# Patient Record
Sex: Male | Born: 1940 | Race: White | Hispanic: No | Marital: Married | Smoking: Never smoker
Health system: Southern US, Community
[De-identification: ages and names within clinical notes are randomized; demographics above are authoritative.]

## PROBLEM LIST (undated history)

## (undated) DIAGNOSIS — I255 Ischemic cardiomyopathy: Secondary | ICD-10-CM

## (undated) DIAGNOSIS — H35039 Hypertensive retinopathy, unspecified eye: Secondary | ICD-10-CM

## (undated) DIAGNOSIS — Z8739 Personal history of other diseases of the musculoskeletal system and connective tissue: Secondary | ICD-10-CM

## (undated) DIAGNOSIS — F419 Anxiety disorder, unspecified: Secondary | ICD-10-CM

## (undated) DIAGNOSIS — K219 Gastro-esophageal reflux disease without esophagitis: Secondary | ICD-10-CM

## (undated) DIAGNOSIS — I219 Acute myocardial infarction, unspecified: Secondary | ICD-10-CM

## (undated) DIAGNOSIS — I509 Heart failure, unspecified: Secondary | ICD-10-CM

## (undated) DIAGNOSIS — I2510 Atherosclerotic heart disease of native coronary artery without angina pectoris: Secondary | ICD-10-CM

## (undated) DIAGNOSIS — I10 Essential (primary) hypertension: Secondary | ICD-10-CM

## (undated) DIAGNOSIS — I739 Peripheral vascular disease, unspecified: Secondary | ICD-10-CM

## (undated) DIAGNOSIS — M1990 Unspecified osteoarthritis, unspecified site: Secondary | ICD-10-CM

## (undated) DIAGNOSIS — Z8679 Personal history of other diseases of the circulatory system: Secondary | ICD-10-CM

## (undated) DIAGNOSIS — I350 Nonrheumatic aortic (valve) stenosis: Secondary | ICD-10-CM

## (undated) DIAGNOSIS — T827XXA Infection and inflammatory reaction due to other cardiac and vascular devices, implants and grafts, initial encounter: Secondary | ICD-10-CM

## (undated) DIAGNOSIS — I209 Angina pectoris, unspecified: Secondary | ICD-10-CM

## (undated) DIAGNOSIS — R0602 Shortness of breath: Secondary | ICD-10-CM

## (undated) DIAGNOSIS — E785 Hyperlipidemia, unspecified: Secondary | ICD-10-CM

## (undated) DIAGNOSIS — I499 Cardiac arrhythmia, unspecified: Secondary | ICD-10-CM

## (undated) DIAGNOSIS — R079 Chest pain, unspecified: Secondary | ICD-10-CM

## (undated) HISTORY — DX: Hypertensive retinopathy, unspecified eye: H35.039

## (undated) HISTORY — DX: Atherosclerotic heart disease of native coronary artery without angina pectoris: I25.10

## (undated) HISTORY — PX: EYE SURGERY: SHX253

## (undated) HISTORY — DX: Essential (primary) hypertension: I10

## (undated) HISTORY — DX: Personal history of other diseases of the musculoskeletal system and connective tissue: Z87.39

## (undated) HISTORY — PX: CATARACT EXTRACTION: SUR2

## (undated) HISTORY — DX: Chest pain, unspecified: R07.9

## (undated) HISTORY — DX: Ischemic cardiomyopathy: I25.5

## (undated) HISTORY — DX: Gastro-esophageal reflux disease without esophagitis: K21.9

## (undated) HISTORY — DX: Peripheral vascular disease, unspecified: I73.9

## (undated) HISTORY — DX: Nonrheumatic aortic (valve) stenosis: I35.0

## (undated) HISTORY — DX: Personal history of other diseases of the circulatory system: Z86.79

## (undated) HISTORY — DX: Hyperlipidemia, unspecified: E78.5

---

## 1991-12-23 HISTORY — PX: CORONARY ARTERY BYPASS GRAFT: SHX141

## 1999-09-28 ENCOUNTER — Encounter: Payer: Self-pay | Admitting: Emergency Medicine

## 1999-09-28 ENCOUNTER — Inpatient Hospital Stay (HOSPITAL_COMMUNITY): Admission: EM | Admit: 1999-09-28 | Discharge: 1999-10-06 | Payer: Self-pay | Admitting: Emergency Medicine

## 1999-09-30 ENCOUNTER — Encounter: Payer: Self-pay | Admitting: Cardiothoracic Surgery

## 1999-10-01 ENCOUNTER — Encounter: Payer: Self-pay | Admitting: Cardiothoracic Surgery

## 1999-10-02 ENCOUNTER — Encounter: Payer: Self-pay | Admitting: Cardiothoracic Surgery

## 1999-10-03 ENCOUNTER — Encounter: Payer: Self-pay | Admitting: Cardiothoracic Surgery

## 1999-10-04 ENCOUNTER — Encounter: Payer: Self-pay | Admitting: Cardiothoracic Surgery

## 1999-11-08 ENCOUNTER — Inpatient Hospital Stay (HOSPITAL_COMMUNITY): Admission: EM | Admit: 1999-11-08 | Discharge: 1999-11-10 | Payer: Self-pay | Admitting: Emergency Medicine

## 1999-11-08 ENCOUNTER — Encounter: Payer: Self-pay | Admitting: Emergency Medicine

## 1999-11-08 ENCOUNTER — Encounter: Payer: Self-pay | Admitting: Cardiovascular Disease

## 2002-11-21 HISTORY — PX: CAROTID ENDARTERECTOMY: SUR193

## 2002-12-08 ENCOUNTER — Encounter: Payer: Self-pay | Admitting: Cardiovascular Disease

## 2002-12-08 ENCOUNTER — Ambulatory Visit (HOSPITAL_COMMUNITY): Admission: RE | Admit: 2002-12-08 | Discharge: 2002-12-08 | Payer: Self-pay | Admitting: Cardiovascular Disease

## 2002-12-09 ENCOUNTER — Inpatient Hospital Stay (HOSPITAL_COMMUNITY): Admission: EM | Admit: 2002-12-09 | Discharge: 2002-12-15 | Payer: Self-pay | Admitting: Emergency Medicine

## 2002-12-09 ENCOUNTER — Encounter: Payer: Self-pay | Admitting: Cardiovascular Disease

## 2003-01-04 ENCOUNTER — Ambulatory Visit (HOSPITAL_COMMUNITY): Admission: RE | Admit: 2003-01-04 | Discharge: 2003-01-04 | Payer: Self-pay | Admitting: Cardiovascular Disease

## 2003-01-04 ENCOUNTER — Encounter: Payer: Self-pay | Admitting: Cardiovascular Disease

## 2003-12-18 HISTORY — PX: CARDIAC CATHETERIZATION: SHX172

## 2004-12-17 ENCOUNTER — Observation Stay (HOSPITAL_COMMUNITY): Admission: EM | Admit: 2004-12-17 | Discharge: 2004-12-18 | Payer: Self-pay | Admitting: Emergency Medicine

## 2004-12-17 ENCOUNTER — Encounter (INDEPENDENT_AMBULATORY_CARE_PROVIDER_SITE_OTHER): Payer: Self-pay | Admitting: Cardiovascular Disease

## 2005-01-23 ENCOUNTER — Ambulatory Visit (HOSPITAL_COMMUNITY): Admission: RE | Admit: 2005-01-23 | Discharge: 2005-01-24 | Payer: Self-pay | Admitting: *Deleted

## 2005-08-24 ENCOUNTER — Emergency Department (HOSPITAL_COMMUNITY): Admission: EM | Admit: 2005-08-24 | Discharge: 2005-08-25 | Payer: Self-pay | Admitting: Emergency Medicine

## 2005-11-28 ENCOUNTER — Observation Stay (HOSPITAL_COMMUNITY): Admission: EM | Admit: 2005-11-28 | Discharge: 2005-11-29 | Payer: Self-pay | Admitting: Emergency Medicine

## 2006-01-23 ENCOUNTER — Inpatient Hospital Stay (HOSPITAL_COMMUNITY): Admission: EM | Admit: 2006-01-23 | Discharge: 2006-01-25 | Payer: Self-pay | Admitting: Emergency Medicine

## 2006-01-23 ENCOUNTER — Ambulatory Visit: Payer: Self-pay | Admitting: Family Medicine

## 2006-03-04 ENCOUNTER — Ambulatory Visit (HOSPITAL_COMMUNITY): Admission: RE | Admit: 2006-03-04 | Discharge: 2006-03-04 | Payer: Self-pay | Admitting: Cardiovascular Disease

## 2006-04-04 ENCOUNTER — Inpatient Hospital Stay (HOSPITAL_COMMUNITY): Admission: EM | Admit: 2006-04-04 | Discharge: 2006-04-06 | Payer: Self-pay | Admitting: Emergency Medicine

## 2006-04-05 ENCOUNTER — Encounter: Payer: Self-pay | Admitting: Vascular Surgery

## 2006-04-05 ENCOUNTER — Encounter (INDEPENDENT_AMBULATORY_CARE_PROVIDER_SITE_OTHER): Payer: Self-pay | Admitting: Cardiology

## 2006-06-25 ENCOUNTER — Encounter: Admission: RE | Admit: 2006-06-25 | Discharge: 2006-09-23 | Payer: Self-pay | Admitting: *Deleted

## 2006-06-25 ENCOUNTER — Ambulatory Visit: Payer: Self-pay | Admitting: Psychology

## 2006-08-31 ENCOUNTER — Encounter: Admission: RE | Admit: 2006-08-31 | Discharge: 2006-08-31 | Payer: Self-pay | Admitting: *Deleted

## 2007-05-18 ENCOUNTER — Emergency Department (HOSPITAL_COMMUNITY): Admission: EM | Admit: 2007-05-18 | Discharge: 2007-05-19 | Payer: Self-pay | Admitting: Emergency Medicine

## 2007-08-12 ENCOUNTER — Observation Stay (HOSPITAL_COMMUNITY): Admission: EM | Admit: 2007-08-12 | Discharge: 2007-08-14 | Payer: Self-pay | Admitting: Emergency Medicine

## 2007-09-09 ENCOUNTER — Ambulatory Visit: Payer: Self-pay | Admitting: Internal Medicine

## 2007-09-09 ENCOUNTER — Inpatient Hospital Stay (HOSPITAL_COMMUNITY): Admission: AD | Admit: 2007-09-09 | Discharge: 2007-09-16 | Payer: Self-pay | Admitting: Cardiovascular Disease

## 2007-10-06 ENCOUNTER — Ambulatory Visit: Payer: Self-pay

## 2007-10-20 ENCOUNTER — Emergency Department (HOSPITAL_COMMUNITY): Admission: EM | Admit: 2007-10-20 | Discharge: 2007-10-20 | Payer: Self-pay | Admitting: Emergency Medicine

## 2007-10-22 ENCOUNTER — Emergency Department (HOSPITAL_COMMUNITY): Admission: EM | Admit: 2007-10-22 | Discharge: 2007-10-23 | Payer: Self-pay | Admitting: Emergency Medicine

## 2007-11-24 ENCOUNTER — Inpatient Hospital Stay (HOSPITAL_COMMUNITY): Admission: AD | Admit: 2007-11-24 | Discharge: 2007-11-28 | Payer: Self-pay | Admitting: Cardiovascular Disease

## 2008-09-06 ENCOUNTER — Inpatient Hospital Stay (HOSPITAL_COMMUNITY): Admission: EM | Admit: 2008-09-06 | Discharge: 2008-09-11 | Payer: Self-pay | Admitting: Emergency Medicine

## 2008-09-22 IMAGING — CR DG CHEST 2V
2 series · 2 of 2 positions shown · non-contrast
Comparison: 11/27/2007

CLINICAL DATA: Cough, fever, right-sided chest pain

CHEST - 2 VIEW

[w chest pa]
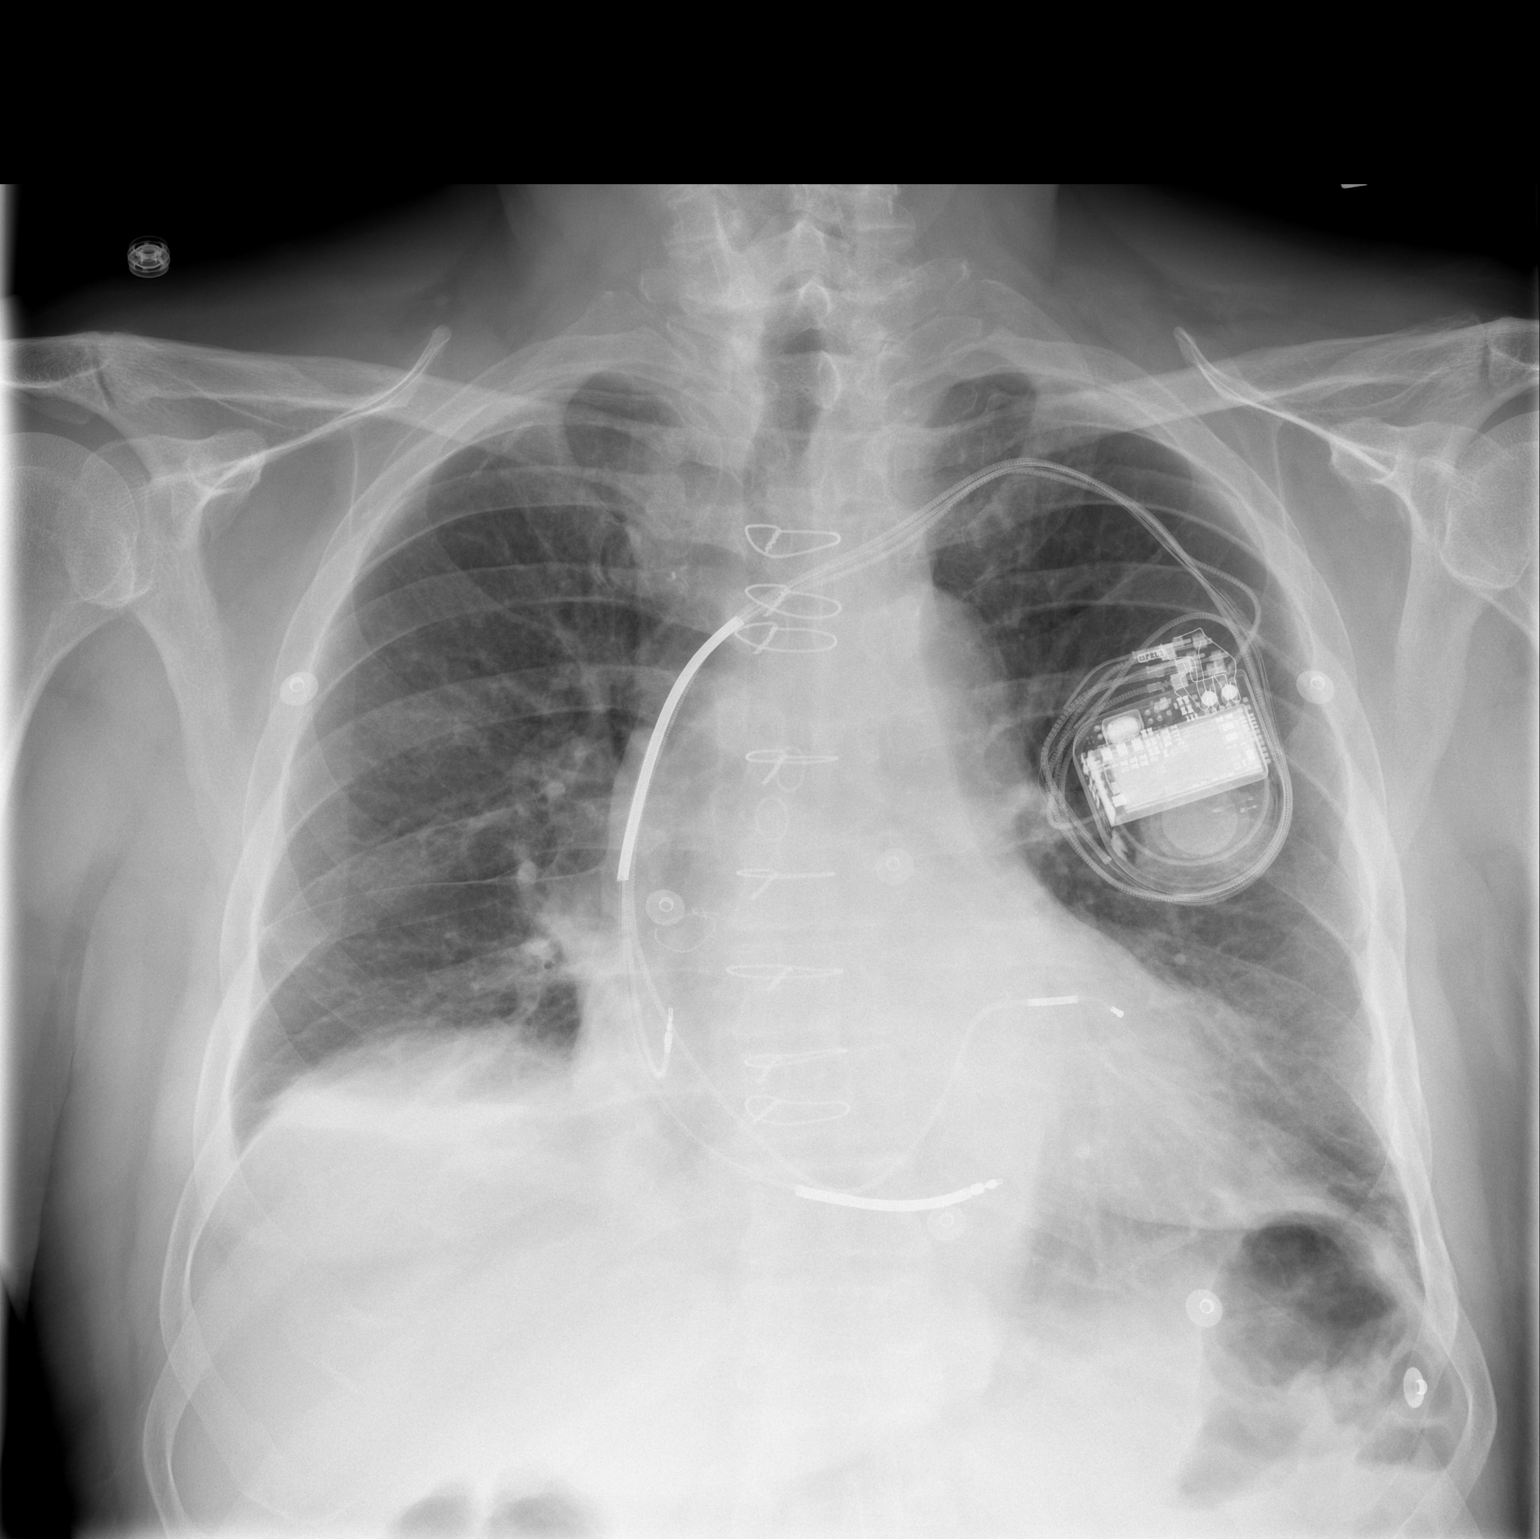

[w chest lat]
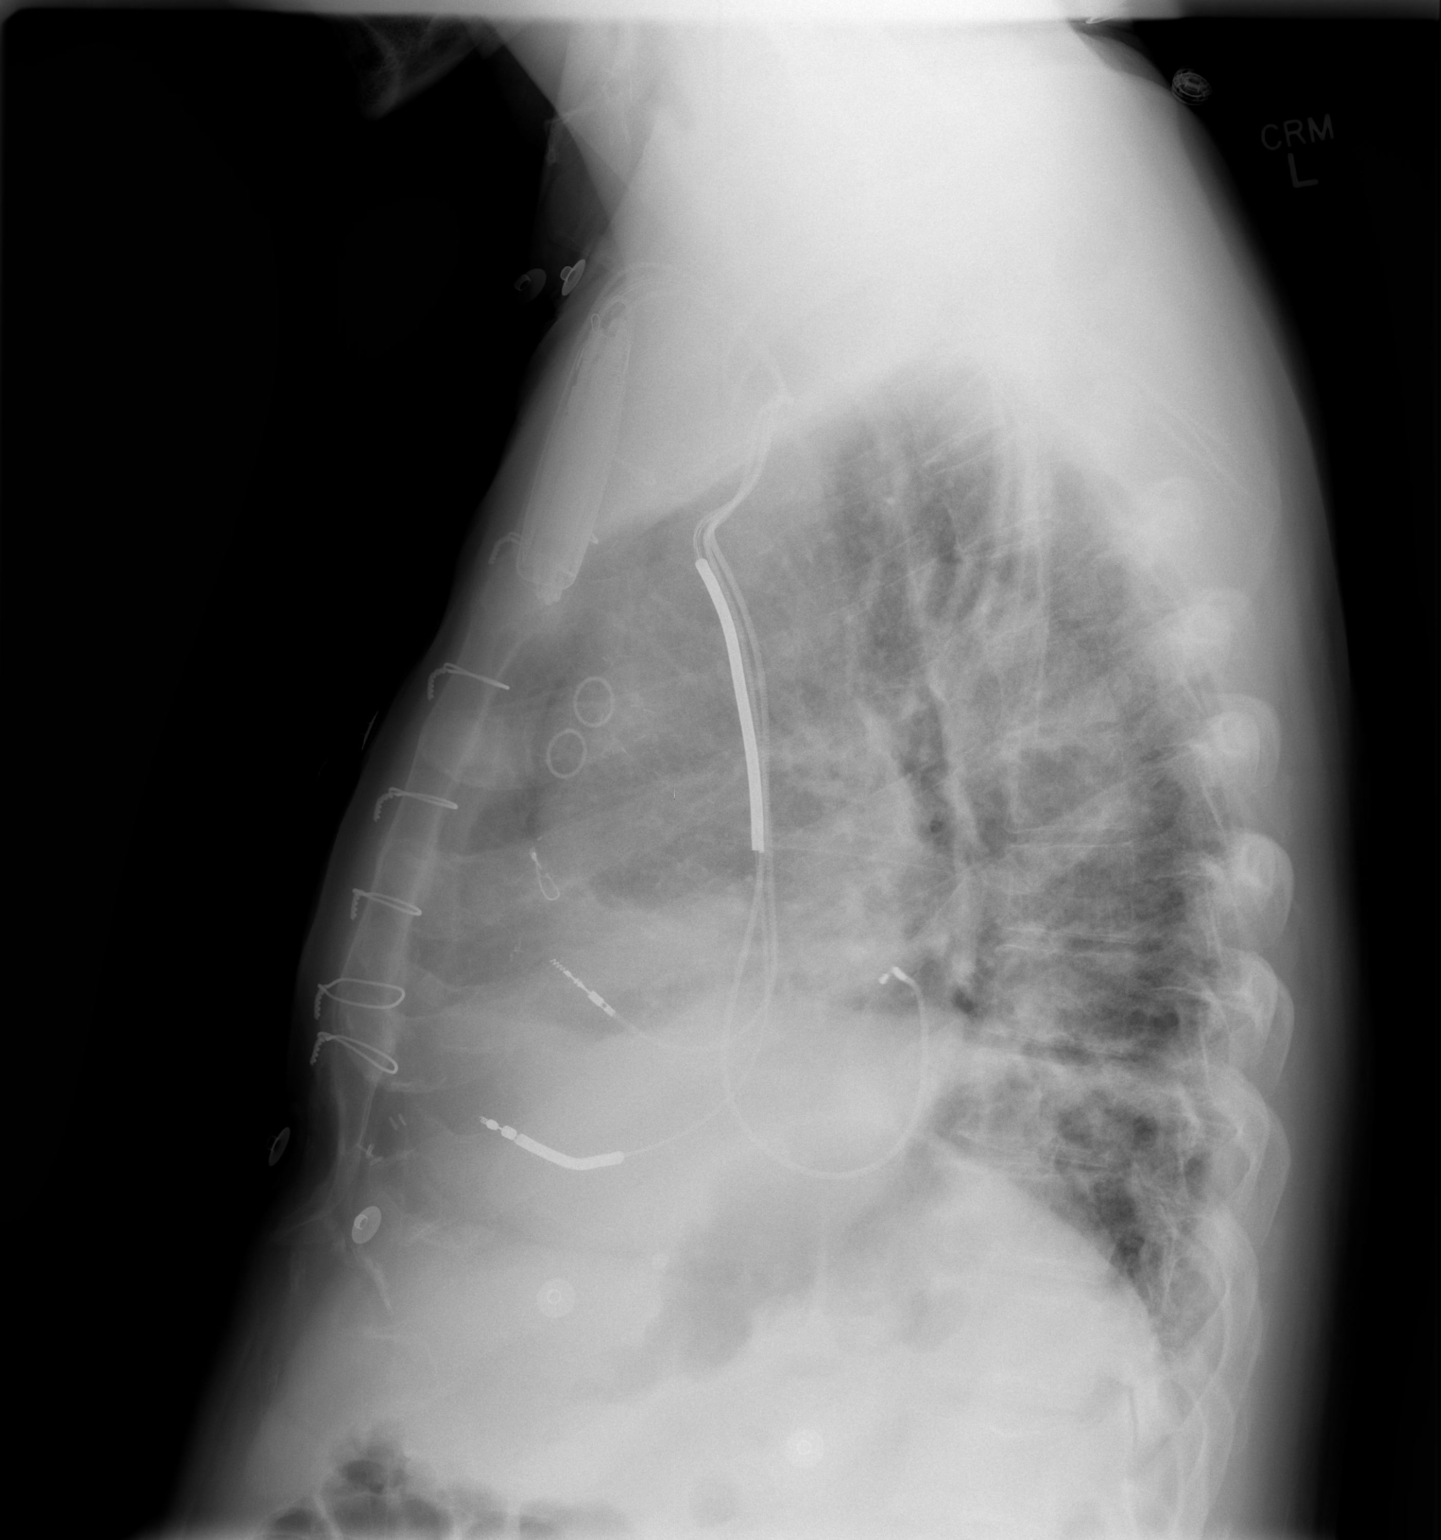

[2 of 2 positions shown; findings below may reference images not displayed]

FINDINGS: The patient is status post CABG.  Left AICD noted.  Dense
plate-like right lower lobe consolidation noted with trace right
pleural effusion.  Left lower lobe atelectasis identified.
IMPRESSION: Right lower lobe airspace disease most compatible with pneumonia.

Trace right effusion.

## 2008-10-21 ENCOUNTER — Ambulatory Visit: Payer: Self-pay | Admitting: Internal Medicine

## 2008-10-21 ENCOUNTER — Inpatient Hospital Stay (HOSPITAL_COMMUNITY): Admission: EM | Admit: 2008-10-21 | Discharge: 2008-10-24 | Payer: Self-pay | Admitting: *Deleted

## 2008-11-06 ENCOUNTER — Encounter: Admission: RE | Admit: 2008-11-06 | Discharge: 2008-11-06 | Payer: Self-pay | Admitting: Gastroenterology

## 2008-11-09 ENCOUNTER — Ambulatory Visit: Payer: Self-pay | Admitting: Internal Medicine

## 2008-11-11 ENCOUNTER — Inpatient Hospital Stay (HOSPITAL_COMMUNITY): Admission: EM | Admit: 2008-11-11 | Discharge: 2008-11-15 | Payer: Self-pay | Admitting: *Deleted

## 2008-12-12 ENCOUNTER — Ambulatory Visit: Payer: Self-pay | Admitting: Internal Medicine

## 2008-12-22 HISTORY — PX: VALVE REPLACEMENT: SUR13

## 2008-12-27 ENCOUNTER — Inpatient Hospital Stay (HOSPITAL_COMMUNITY): Admission: EM | Admit: 2008-12-27 | Discharge: 2008-12-30 | Payer: Self-pay | Admitting: Emergency Medicine

## 2009-01-01 ENCOUNTER — Ambulatory Visit: Payer: Self-pay | Admitting: Internal Medicine

## 2009-01-17 DIAGNOSIS — I25810 Atherosclerosis of coronary artery bypass graft(s) without angina pectoris: Secondary | ICD-10-CM | POA: Insufficient documentation

## 2009-01-17 DIAGNOSIS — I5042 Chronic combined systolic (congestive) and diastolic (congestive) heart failure: Secondary | ICD-10-CM

## 2009-01-17 DIAGNOSIS — I4891 Unspecified atrial fibrillation: Secondary | ICD-10-CM

## 2009-02-08 ENCOUNTER — Inpatient Hospital Stay (HOSPITAL_COMMUNITY): Admission: EM | Admit: 2009-02-08 | Discharge: 2009-02-13 | Payer: Self-pay | Admitting: Emergency Medicine

## 2009-02-24 IMAGING — CR DG CHEST 1V PORT
1 series · 1 of 1 positions shown · non-contrast
Comparison: 12/27/2008 and 11/11/2008.

CLINICAL DATA: Shortness of breath.  Tachycardia.

PORTABLE CHEST - 1 VIEW

[view not recorded]
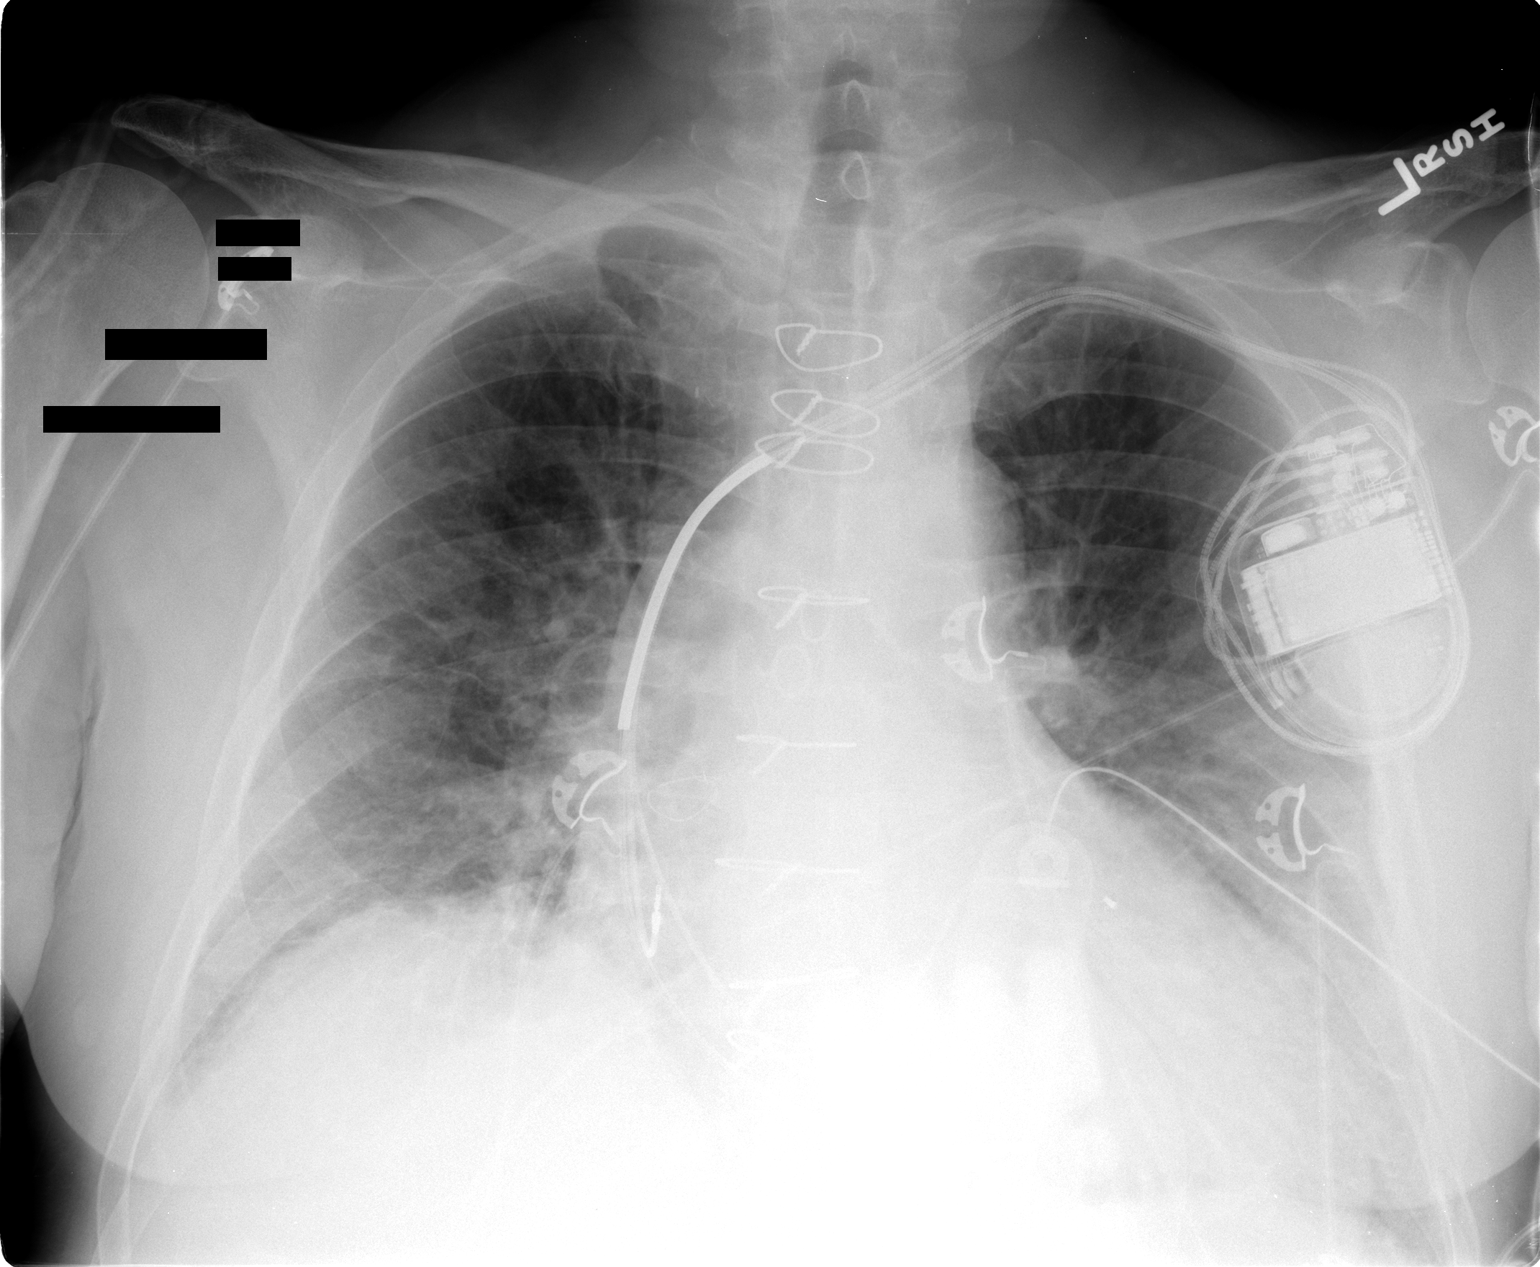

[1 of 1 positions shown; findings below may reference images not displayed]

FINDINGS: 9549 hours.  The left subclavian AICD leads appear stable
in position.  There is stable cardiac enlargement status post
median sternotomy and CABG.  Chronic elevation of the right
hemidiaphragm is associated with stable right basilar atelectasis.
There has been interval development of mild pulmonary edema,
asymmetric to the right.  No significant pleural effusion is seen.
IMPRESSION: Mild pulmonary edema with stable cardiomegaly and right basilar
atelectasis.

## 2009-02-25 IMAGING — CR DG CHEST 1V PORT
1 series · 1 of 1 positions shown · non-contrast
Comparison: Portable chest 02/08/2009.

CLINICAL DATA: Congestive heart failure.

PORTABLE CHEST - 1 VIEW

[view not recorded]
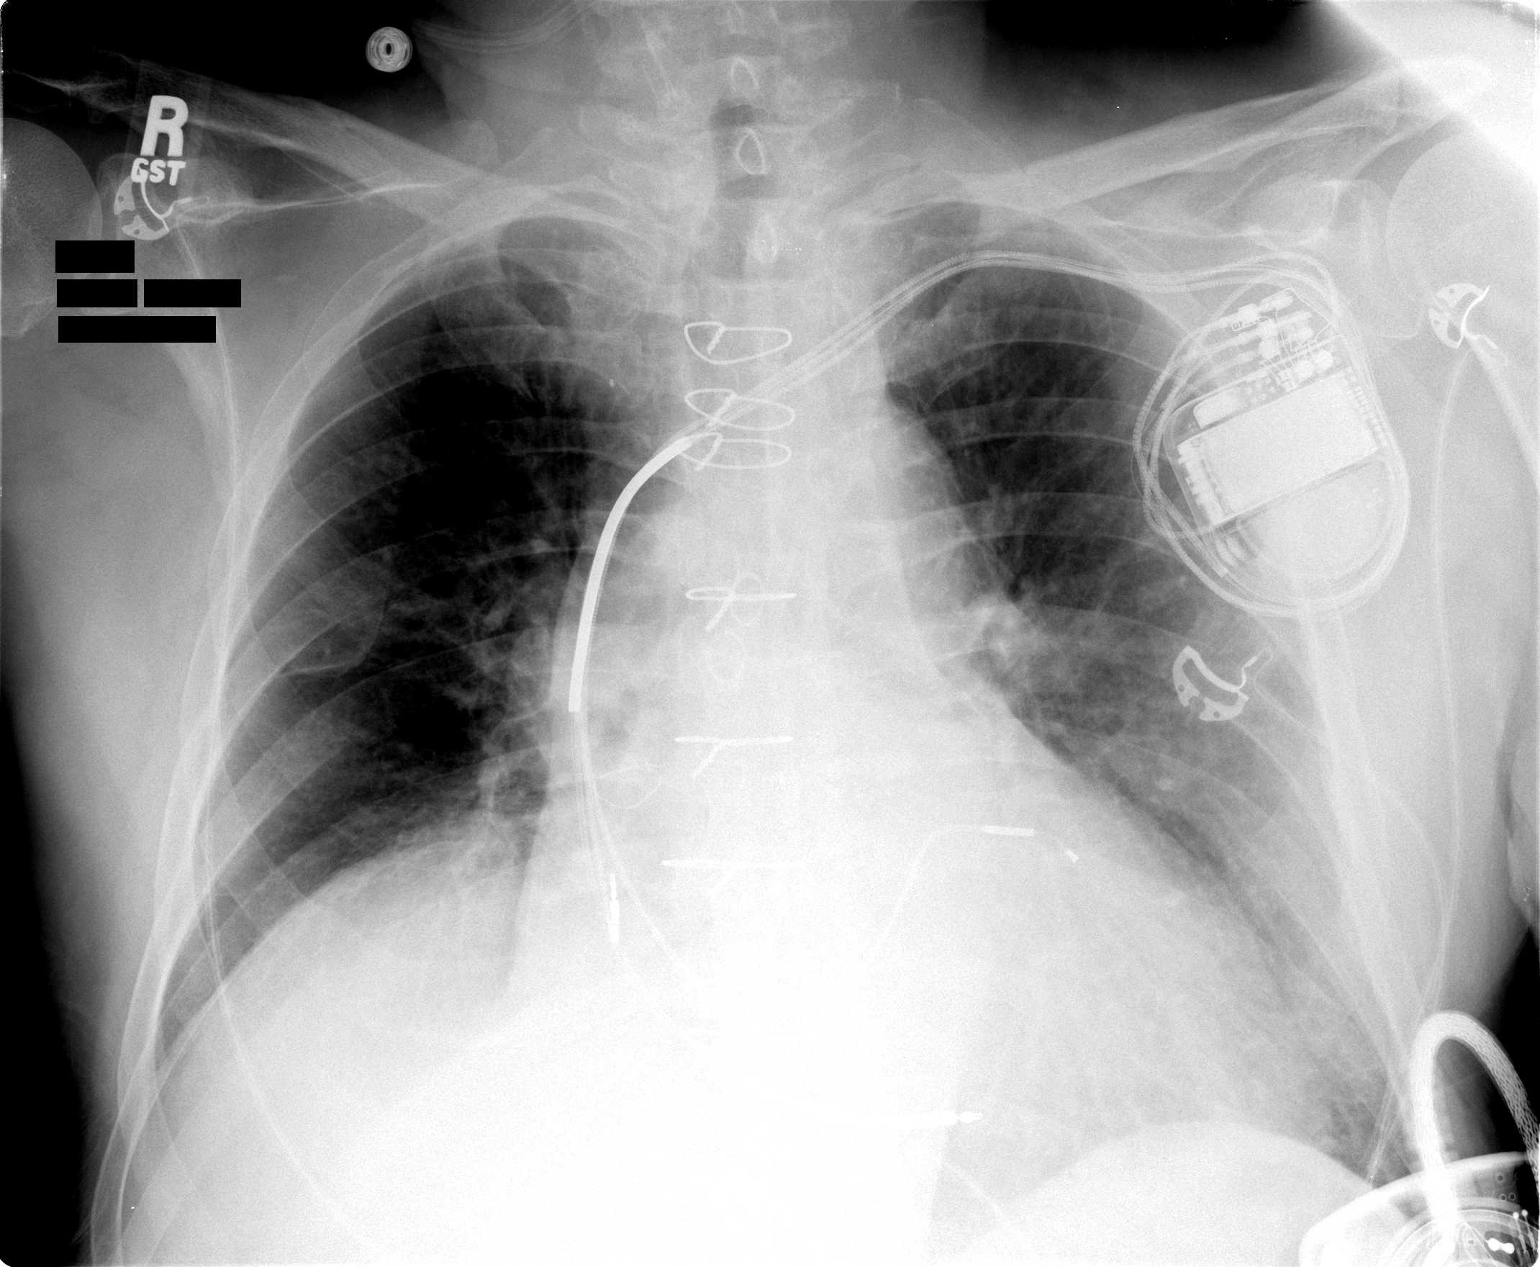

[1 of 1 positions shown; findings below may reference images not displayed]

FINDINGS: There is cardiomegaly with decreased pulmonary edema.
Elevation right hemidiaphragm again noted.
IMPRESSION: Improved pulmonary edema.

## 2009-03-14 DIAGNOSIS — E039 Hypothyroidism, unspecified: Secondary | ICD-10-CM

## 2009-03-14 DIAGNOSIS — I679 Cerebrovascular disease, unspecified: Secondary | ICD-10-CM

## 2009-03-14 DIAGNOSIS — N189 Chronic kidney disease, unspecified: Secondary | ICD-10-CM

## 2009-03-14 DIAGNOSIS — Z862 Personal history of diseases of the blood and blood-forming organs and certain disorders involving the immune mechanism: Secondary | ICD-10-CM

## 2009-03-14 DIAGNOSIS — Z951 Presence of aortocoronary bypass graft: Secondary | ICD-10-CM

## 2009-03-14 DIAGNOSIS — I10 Essential (primary) hypertension: Secondary | ICD-10-CM | POA: Insufficient documentation

## 2009-03-14 DIAGNOSIS — F1020 Alcohol dependence, uncomplicated: Secondary | ICD-10-CM

## 2009-03-14 DIAGNOSIS — I255 Ischemic cardiomyopathy: Secondary | ICD-10-CM

## 2009-03-14 DIAGNOSIS — E785 Hyperlipidemia, unspecified: Secondary | ICD-10-CM

## 2009-03-14 DIAGNOSIS — M109 Gout, unspecified: Secondary | ICD-10-CM

## 2009-03-14 DIAGNOSIS — Z9889 Other specified postprocedural states: Secondary | ICD-10-CM | POA: Insufficient documentation

## 2009-03-14 DIAGNOSIS — I739 Peripheral vascular disease, unspecified: Secondary | ICD-10-CM

## 2009-03-15 ENCOUNTER — Encounter: Payer: Self-pay | Admitting: Internal Medicine

## 2009-03-15 ENCOUNTER — Ambulatory Visit: Payer: Self-pay | Admitting: Internal Medicine

## 2009-04-04 ENCOUNTER — Encounter: Payer: Self-pay | Admitting: Internal Medicine

## 2009-04-09 ENCOUNTER — Encounter: Payer: Self-pay | Admitting: Internal Medicine

## 2009-04-09 ENCOUNTER — Ambulatory Visit: Payer: Self-pay | Admitting: Internal Medicine

## 2009-04-17 ENCOUNTER — Inpatient Hospital Stay (HOSPITAL_COMMUNITY): Admission: EM | Admit: 2009-04-17 | Discharge: 2009-04-23 | Payer: Self-pay | Admitting: Emergency Medicine

## 2009-04-30 ENCOUNTER — Encounter: Admission: RE | Admit: 2009-04-30 | Discharge: 2009-04-30 | Payer: Self-pay | Admitting: Gastroenterology

## 2009-05-05 ENCOUNTER — Inpatient Hospital Stay (HOSPITAL_COMMUNITY): Admission: EM | Admit: 2009-05-05 | Discharge: 2009-05-21 | Payer: Self-pay | Admitting: Emergency Medicine

## 2009-05-05 ENCOUNTER — Ambulatory Visit: Payer: Self-pay | Admitting: Cardiothoracic Surgery

## 2009-05-11 ENCOUNTER — Encounter: Payer: Self-pay | Admitting: Cardiothoracic Surgery

## 2009-05-11 ENCOUNTER — Ambulatory Visit: Payer: Self-pay | Admitting: Dentistry

## 2009-05-15 ENCOUNTER — Ambulatory Visit: Payer: Self-pay | Admitting: Dentistry

## 2009-05-17 ENCOUNTER — Encounter (INDEPENDENT_AMBULATORY_CARE_PROVIDER_SITE_OTHER): Payer: Self-pay | Admitting: Cardiology

## 2009-05-29 ENCOUNTER — Encounter: Admission: AD | Admit: 2009-05-29 | Discharge: 2009-05-29 | Payer: Self-pay | Admitting: Dentistry

## 2009-06-24 ENCOUNTER — Inpatient Hospital Stay (HOSPITAL_COMMUNITY): Admission: EM | Admit: 2009-06-24 | Discharge: 2009-07-01 | Payer: Self-pay | Admitting: Emergency Medicine

## 2009-07-03 ENCOUNTER — Ambulatory Visit: Payer: Self-pay | Admitting: Dentistry

## 2009-07-10 IMAGING — CR DG CHEST 1V PORT
1 series · 1 of 1 positions shown · non-contrast
Comparison: 05/09/2009

CLINICAL DATA: Shortness of breath and chest pain

PORTABLE CHEST - 1 VIEW

[AP]
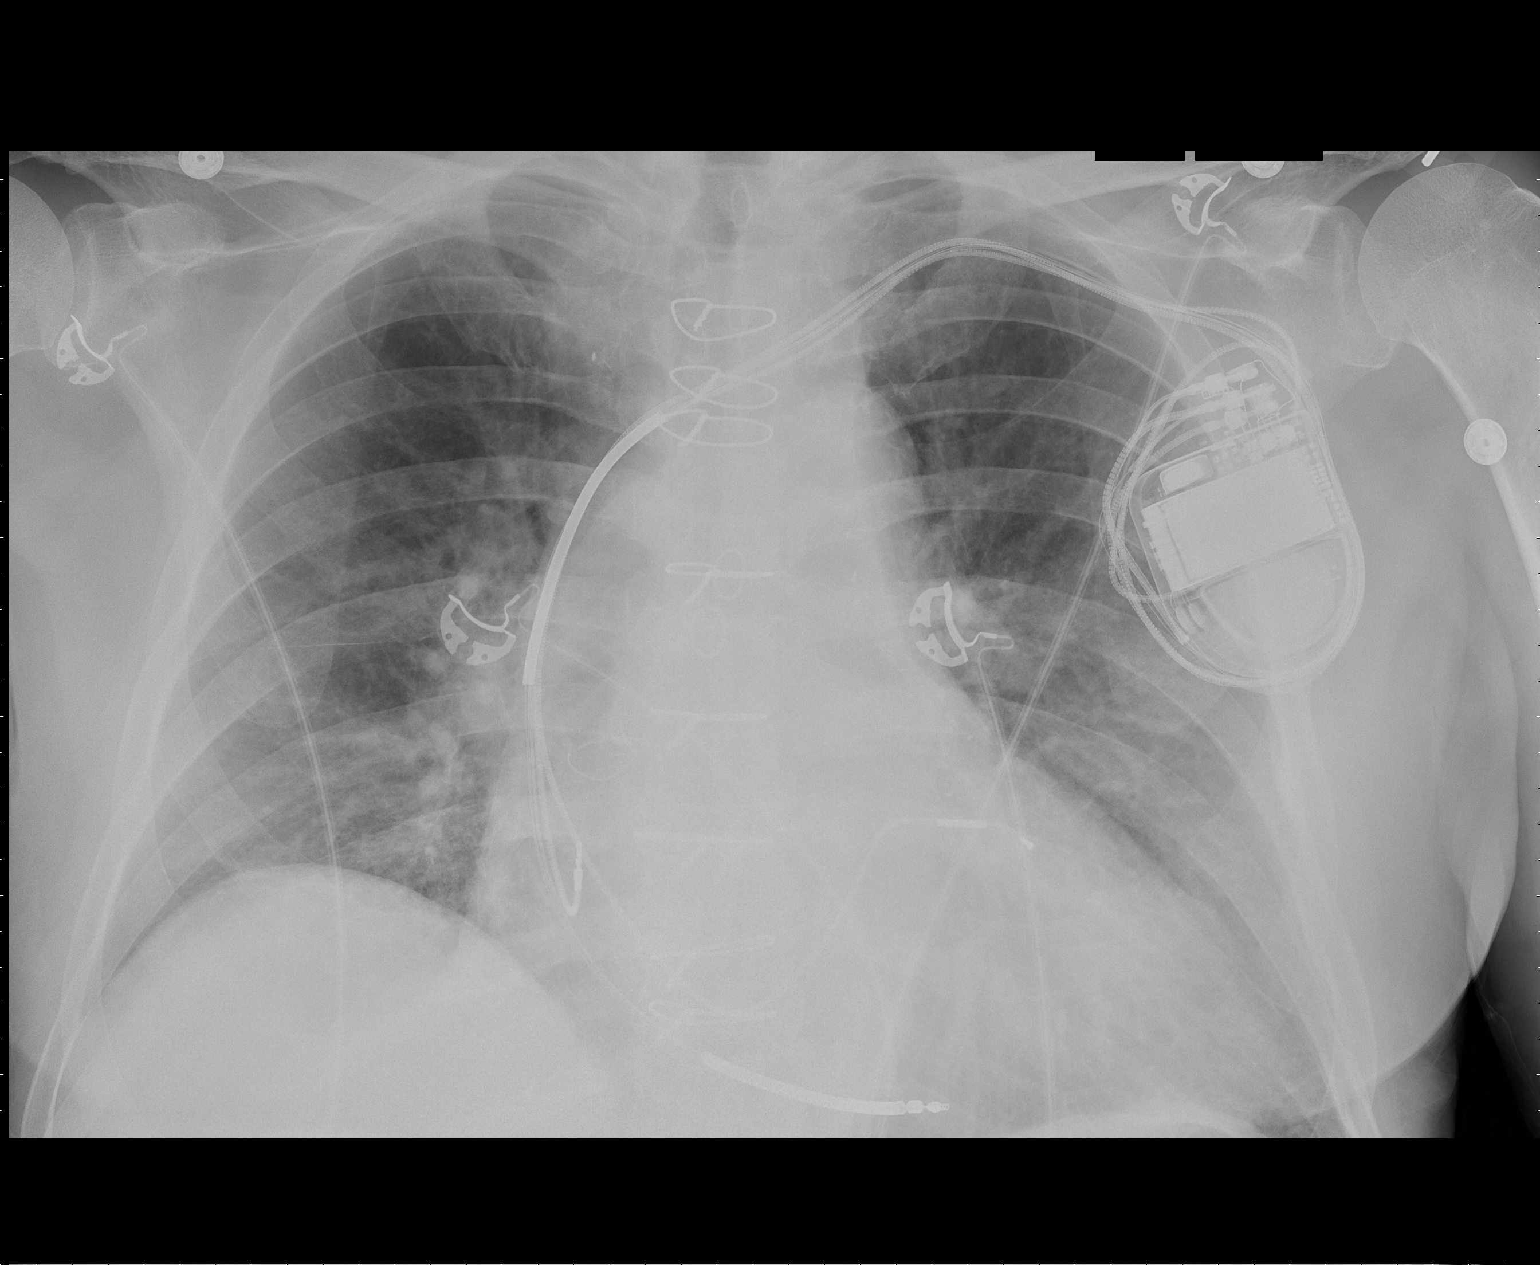

[1 of 1 positions shown; findings below may reference images not displayed]

FINDINGS: There is a left chest wall ICD with leads in the right
atrial appendage coronary sinus and right ventricle.

The heart size is enlarged.

The lung volumes are low.

There is no pleural effusion or pulmonary edema.

No airspace consolidation identified.
IMPRESSION: 1.  Cardiac enlargement without heart failure.

## 2009-07-23 ENCOUNTER — Inpatient Hospital Stay (HOSPITAL_COMMUNITY): Admission: AD | Admit: 2009-07-23 | Discharge: 2009-07-31 | Payer: Self-pay | Admitting: Cardiology

## 2009-07-24 ENCOUNTER — Encounter (INDEPENDENT_AMBULATORY_CARE_PROVIDER_SITE_OTHER): Payer: Self-pay | Admitting: Cardiovascular Disease

## 2009-07-27 ENCOUNTER — Encounter (INDEPENDENT_AMBULATORY_CARE_PROVIDER_SITE_OTHER): Payer: Self-pay | Admitting: *Deleted

## 2009-08-09 ENCOUNTER — Encounter: Payer: Self-pay | Admitting: Internal Medicine

## 2009-09-04 ENCOUNTER — Encounter (INDEPENDENT_AMBULATORY_CARE_PROVIDER_SITE_OTHER): Payer: Self-pay | Admitting: *Deleted

## 2009-09-20 ENCOUNTER — Encounter: Payer: Self-pay | Admitting: Internal Medicine

## 2009-11-08 ENCOUNTER — Encounter (INDEPENDENT_AMBULATORY_CARE_PROVIDER_SITE_OTHER): Payer: Self-pay | Admitting: *Deleted

## 2010-11-26 ENCOUNTER — Encounter (INDEPENDENT_AMBULATORY_CARE_PROVIDER_SITE_OTHER): Payer: Self-pay | Admitting: *Deleted

## 2011-01-12 ENCOUNTER — Encounter: Payer: Self-pay | Admitting: Gastroenterology

## 2011-01-12 ENCOUNTER — Encounter: Payer: Self-pay | Admitting: *Deleted

## 2011-01-21 NOTE — Letter (Signed)
Summary: Device-Delinquent Check  [facility], Main Office  [address]   [place], [place] [address]   Phone: [phone]  Fax: [phone]     November 26, 2010 MRN: [id number]   [name] [address] [place], [place]  [address]   Dear Mr. [facility],  According to our records, you have not had your implanted device checked in the recommended period of time.  We are unable to determine appropriate device function without checking your device on a regular basis.  Please call our office to schedule an appointment, with Dr Graciela Husbands, as soon as possible.  If you are having your device checked by another physician, please call us so that we may update our records.  Thank you,  Letta Moynahan, EMT  November 26, 2010 3:23 PM  [facility]  certified

## 2011-03-29 LAB — COMPREHENSIVE METABOLIC PANEL
AST: 19 U/L (ref 0–37)
Albumin: 2.8 g/dL — ABNORMAL LOW (ref 3.5–5.2)
Alkaline Phosphatase: 87 U/L (ref 39–117)
Alkaline Phosphatase: 91 U/L (ref 39–117)
BUN: 14 mg/dL (ref 6–23)
CO2: 33 mEq/L — ABNORMAL HIGH (ref 19–32)
CO2: 34 mEq/L — ABNORMAL HIGH (ref 19–32)
Calcium: 9.1 mg/dL (ref 8.4–10.5)
Chloride: 98 mEq/L (ref 96–112)
Chloride: 98 mEq/L (ref 96–112)
Chloride: 99 mEq/L (ref 96–112)
Creatinine, Ser: 1.02 mg/dL (ref 0.4–1.5)
Creatinine, Ser: 1.09 mg/dL (ref 0.4–1.5)
Creatinine, Ser: 1.09 mg/dL (ref 0.4–1.5)
GFR calc Af Amer: >60 mL/min (ref >60)
GFR calc non Af Amer: >60 mL/min (ref >60)
GFR calc non Af Amer: >60 mL/min (ref >60)
Glucose, Bld: 80 mg/dL (ref 70–99)
Glucose, Bld: 93 mg/dL (ref 70–99)
Potassium: 3.5 mEq/L (ref 3.5–5.1)
Total Bilirubin: 1 mg/dL (ref 0.3–1.2)
Total Bilirubin: 1.1 mg/dL (ref 0.3–1.2)
Total Bilirubin: 1.2 mg/dL (ref 0.3–1.2)

## 2011-03-29 LAB — MAGNESIUM
Magnesium: 1.9 mg/dL (ref 1.5–2.5)
Magnesium: 2 mg/dL (ref 1.5–2.5)
Magnesium: 2.1 mg/dL (ref 1.5–2.5)

## 2011-03-29 LAB — BASIC METABOLIC PANEL
BUN: 9 mg/dL (ref 6–23)
CO2: 28 mEq/L (ref 19–32)
CO2: 29 mEq/L (ref 19–32)
CO2: 29 mEq/L (ref 19–32)
CO2: 30 mEq/L (ref 19–32)
CO2: 30 mEq/L (ref 19–32)
Calcium: 8.9 mg/dL (ref 8.4–10.5)
Calcium: 9 mg/dL (ref 8.4–10.5)
Calcium: 9.2 mg/dL (ref 8.4–10.5)
Chloride: 100 mEq/L (ref 96–112)
Chloride: 96 mEq/L (ref 96–112)
Chloride: 98 mEq/L (ref 96–112)
Chloride: 99 mEq/L (ref 96–112)
Chloride: 99 mEq/L (ref 96–112)
Chloride: 99 mEq/L (ref 96–112)
Creatinine, Ser: 0.87 mg/dL (ref 0.4–1.5)
Creatinine, Ser: 0.92 mg/dL (ref 0.4–1.5)
Creatinine, Ser: 0.95 mg/dL (ref 0.4–1.5)
Creatinine, Ser: 1.36 mg/dL (ref 0.4–1.5)
GFR calc Af Amer: >60 mL/min (ref >60)
GFR calc Af Amer: >60 mL/min (ref >60)
GFR calc Af Amer: >60 mL/min (ref >60)
GFR calc Af Amer: >60 mL/min (ref >60)
GFR calc Af Amer: >60 mL/min (ref >60)
GFR calc non Af Amer: >60 mL/min (ref >60)
Glucose, Bld: 74 mg/dL (ref 70–99)
Glucose, Bld: 90 mg/dL (ref 70–99)
Glucose, Bld: 94 mg/dL (ref 70–99)
Glucose, Bld: 95 mg/dL (ref 70–99)
Potassium: 3.7 mEq/L (ref 3.5–5.1)
Potassium: 4 mEq/L (ref 3.5–5.1)
Sodium: 133 mEq/L — ABNORMAL LOW (ref 135–145)
Sodium: 136 mEq/L (ref 135–145)

## 2011-03-29 LAB — CBC
HCT: 35.1 % — ABNORMAL LOW (ref 39.0–52.0)
HCT: 37.7 % — ABNORMAL LOW (ref 39.0–52.0)
Hemoglobin: 12.8 g/dL — ABNORMAL LOW (ref 13.0–17.0)
MCHC: 33.4 g/dL (ref 30.0–36.0)
MCHC: 33.4 g/dL (ref 30.0–36.0)
MCHC: 33.7 g/dL (ref 30.0–36.0)
MCV: 88.9 fL (ref 78.0–100.0)
MCV: 89.4 fL (ref 78.0–100.0)
MCV: 89.7 fL (ref 78.0–100.0)
MCV: 89.8 fL (ref 78.0–100.0)
Platelets: 330 10*3/uL (ref 150–400)
Platelets: 334 10*3/uL (ref 150–400)
RBC: 3.94 MIL/uL — ABNORMAL LOW (ref 4.22–5.81)
RBC: 3.95 MIL/uL — ABNORMAL LOW (ref 4.22–5.81)
RBC: 4.24 MIL/uL (ref 4.22–5.81)
RBC: 4.51 MIL/uL (ref 4.22–5.81)
RDW: 16.7 % — ABNORMAL HIGH (ref 11.5–15.5)
RDW: 16.7 % — ABNORMAL HIGH (ref 11.5–15.5)
WBC: 6.3 10*3/uL (ref 4.0–10.5)
WBC: 8.4 10*3/uL (ref 4.0–10.5)
WBC: 8.5 10*3/uL (ref 4.0–10.5)

## 2011-03-29 LAB — LIPID PANEL
Total CHOL/HDL Ratio: 7.8 RATIO
Triglycerides: 121 mg/dL (ref <150)
VLDL: 24 mg/dL (ref 0–40)

## 2011-03-29 LAB — PROTIME-INR
INR: 1 (ref 0.00–1.49)
INR: 1.1 (ref 0.00–1.49)
INR: 2 — ABNORMAL HIGH (ref 0.00–1.49)
INR: 2.4 — ABNORMAL HIGH (ref 0.00–1.49)
INR: 2.4 — ABNORMAL HIGH (ref 0.00–1.49)
Prothrombin Time: 13.2 seconds (ref 11.6–15.2)
Prothrombin Time: 17.7 seconds — ABNORMAL HIGH (ref 11.6–15.2)
Prothrombin Time: 25.9 seconds — ABNORMAL HIGH (ref 11.6–15.2)

## 2011-03-29 LAB — BRAIN NATRIURETIC PEPTIDE
Pro B Natriuretic peptide (BNP): 321 pg/mL — ABNORMAL HIGH (ref 0.0–100.0)
Pro B Natriuretic peptide (BNP): 908 pg/mL — ABNORMAL HIGH (ref 0.0–100.0)

## 2011-03-29 LAB — DIFFERENTIAL
Basophils Relative: 0 % (ref 0–1)
Eosinophils Absolute: 0.1 10*3/uL (ref 0.0–0.7)
Eosinophils Relative: 2 % (ref 0–5)
Monocytes Absolute: 0.8 10*3/uL (ref 0.1–1.0)
Monocytes Relative: 10 % (ref 3–12)
Neutrophils Relative %: 78 % — ABNORMAL HIGH (ref 43–77)

## 2011-03-29 LAB — CARDIAC PANEL(CRET KIN+CKTOT+MB+TROPI)
Relative Index: INVALID (ref 0.0–2.5)
Troponin I: 0.13 ng/mL — ABNORMAL HIGH (ref 0.00–0.06)

## 2011-03-29 LAB — SEDIMENTATION RATE: Sed Rate: 50 mm/hr — ABNORMAL HIGH (ref 0–16)

## 2011-03-29 LAB — POTASSIUM: Potassium: 3.6 mEq/L (ref 3.5–5.1)

## 2011-03-30 LAB — CBC
HCT: 27.5 % — ABNORMAL LOW (ref 39.0–52.0)
HCT: 30.5 % — ABNORMAL LOW (ref 39.0–52.0)
Hemoglobin: 8.7 g/dL — ABNORMAL LOW (ref 13.0–17.0)
Hemoglobin: 9.2 g/dL — ABNORMAL LOW (ref 13.0–17.0)
MCHC: 33.3 g/dL (ref 30.0–36.0)
MCHC: 33.7 g/dL (ref 30.0–36.0)
MCV: 82.6 fL (ref 78.0–100.0)
MCV: 83.7 fL (ref 78.0–100.0)
MCV: 84.4 fL (ref 78.0–100.0)
Platelets: 221 10*3/uL (ref 150–400)
Platelets: 225 10*3/uL (ref 150–400)
Platelets: 248 10*3/uL (ref 150–400)
RBC: 3.08 MIL/uL — ABNORMAL LOW (ref 4.22–5.81)
RBC: 3.61 MIL/uL — ABNORMAL LOW (ref 4.22–5.81)
RBC: 4.13 MIL/uL — ABNORMAL LOW (ref 4.22–5.81)
RDW: 18.5 % — ABNORMAL HIGH (ref 11.5–15.5)
RDW: 18.7 % — ABNORMAL HIGH (ref 11.5–15.5)
WBC: 5.8 10*3/uL (ref 4.0–10.5)
WBC: 6.3 10*3/uL (ref 4.0–10.5)
WBC: 6.8 10*3/uL (ref 4.0–10.5)
WBC: 8.6 10*3/uL (ref 4.0–10.5)

## 2011-03-30 LAB — BASIC METABOLIC PANEL
BUN: 18 mg/dL (ref 6–23)
BUN: 18 mg/dL (ref 6–23)
BUN: 20 mg/dL (ref 6–23)
CO2: 28 mEq/L (ref 19–32)
CO2: 31 mEq/L (ref 19–32)
Calcium: 9 mg/dL (ref 8.4–10.5)
Chloride: 102 mEq/L (ref 96–112)
Chloride: 98 mEq/L (ref 96–112)
Chloride: 98 mEq/L (ref 96–112)
Chloride: 98 mEq/L (ref 96–112)
Creatinine, Ser: 1.17 mg/dL (ref 0.4–1.5)
GFR calc Af Amer: >60 mL/min (ref >60)
GFR calc non Af Amer: >60 mL/min (ref >60)
GFR calc non Af Amer: >60 mL/min (ref >60)
Glucose, Bld: 148 mg/dL — ABNORMAL HIGH (ref 70–99)
Potassium: 3.6 mEq/L (ref 3.5–5.1)
Potassium: 3.8 mEq/L (ref 3.5–5.1)
Potassium: 3.8 mEq/L (ref 3.5–5.1)
Sodium: 133 mEq/L — ABNORMAL LOW (ref 135–145)
Sodium: 134 mEq/L — ABNORMAL LOW (ref 135–145)
Sodium: 138 mEq/L (ref 135–145)

## 2011-03-30 LAB — COMPREHENSIVE METABOLIC PANEL
ALT: 15 U/L (ref 0–53)
AST: 20 U/L (ref 0–37)
Albumin: 3.2 g/dL — ABNORMAL LOW (ref 3.5–5.2)
BUN: 22 mg/dL (ref 6–23)
CO2: 22 mEq/L (ref 19–32)
CO2: 30 mEq/L (ref 19–32)
Calcium: 9.1 mg/dL (ref 8.4–10.5)
Chloride: 95 mEq/L — ABNORMAL LOW (ref 96–112)
Creatinine, Ser: 1.04 mg/dL (ref 0.4–1.5)
Creatinine, Ser: 1.11 mg/dL (ref 0.4–1.5)
GFR calc Af Amer: >60 mL/min (ref >60)
GFR calc non Af Amer: >60 mL/min (ref >60)
GFR calc non Af Amer: >60 mL/min (ref >60)
Glucose, Bld: 105 mg/dL — ABNORMAL HIGH (ref 70–99)
Potassium: 3.6 mEq/L (ref 3.5–5.1)
Sodium: 136 mEq/L (ref 135–145)
Total Bilirubin: 0.7 mg/dL (ref 0.3–1.2)

## 2011-03-30 LAB — APTT: aPTT: 40 seconds — ABNORMAL HIGH (ref 24–37)

## 2011-03-30 LAB — CROSSMATCH

## 2011-03-30 LAB — POCT CARDIAC MARKERS: Myoglobin, poc: 55 ng/mL (ref 12–200)

## 2011-03-30 LAB — PROTIME-INR
INR: 1.8 — ABNORMAL HIGH (ref 0.00–1.49)
INR: 1.9 — ABNORMAL HIGH (ref 0.00–1.49)
INR: 2 — ABNORMAL HIGH (ref 0.00–1.49)
INR: 2.1 — ABNORMAL HIGH (ref 0.00–1.49)
Prothrombin Time: 22.3 seconds — ABNORMAL HIGH (ref 11.6–15.2)
Prothrombin Time: 22.5 seconds — ABNORMAL HIGH (ref 11.6–15.2)
Prothrombin Time: 23.9 seconds — ABNORMAL HIGH (ref 11.6–15.2)
Prothrombin Time: 24.7 seconds — ABNORMAL HIGH (ref 11.6–15.2)

## 2011-03-30 LAB — DIFFERENTIAL
Basophils Absolute: 0 10*3/uL (ref 0.0–0.1)
Lymphocytes Relative: 13 % (ref 12–46)
Monocytes Absolute: 0.9 10*3/uL (ref 0.1–1.0)
Monocytes Relative: 15 % — ABNORMAL HIGH (ref 3–12)
Neutro Abs: 4.1 10*3/uL (ref 1.7–7.7)

## 2011-03-30 LAB — POCT I-STAT, CHEM 8
BUN: 25 mg/dL — ABNORMAL HIGH (ref 6–23)
Hemoglobin: 8.8 g/dL — ABNORMAL LOW (ref 13.0–17.0)
Sodium: 136 mEq/L (ref 135–145)
TCO2: 23 mmol/L (ref 0–100)

## 2011-03-30 LAB — BRAIN NATRIURETIC PEPTIDE
Pro B Natriuretic peptide (BNP): 1137 pg/mL — ABNORMAL HIGH (ref 0.0–100.0)
Pro B Natriuretic peptide (BNP): 1374 pg/mL — ABNORMAL HIGH (ref 0.0–100.0)
Pro B Natriuretic peptide (BNP): 2429 pg/mL — ABNORMAL HIGH (ref 0.0–100.0)
Pro B Natriuretic peptide (BNP): 615 pg/mL — ABNORMAL HIGH (ref 0.0–100.0)

## 2011-03-30 LAB — HEPARIN LEVEL (UNFRACTIONATED)
Heparin Unfractionated: 0.3 IU/mL (ref 0.30–0.70)
Heparin Unfractionated: 0.55 IU/mL (ref 0.30–0.70)

## 2011-03-30 LAB — TSH: TSH: 1.904 u[IU]/mL (ref 0.350–4.500)

## 2011-03-30 LAB — HEPATIC FUNCTION PANEL
ALT: 15 U/L (ref 0–53)
AST: 19 U/L (ref 0–37)
Alkaline Phosphatase: 62 U/L (ref 39–117)
Bilirubin, Direct: 0.1 mg/dL (ref 0.0–0.3)
Indirect Bilirubin: 1 mg/dL — ABNORMAL HIGH (ref 0.3–0.9)
Total Bilirubin: 1.1 mg/dL (ref 0.3–1.2)

## 2011-03-30 LAB — HEMOGLOBIN A1C: Mean Plasma Glucose: 108 mg/dL

## 2011-03-30 LAB — LIPID PANEL
HDL: 35 mg/dL — ABNORMAL LOW (ref >39)
LDL Cholesterol: 170 mg/dL — ABNORMAL HIGH (ref 0–99)
Total CHOL/HDL Ratio: 6.5 RATIO
VLDL: 22 mg/dL (ref 0–40)

## 2011-03-30 LAB — CARDIAC PANEL(CRET KIN+CKTOT+MB+TROPI): Troponin I: 0.18 ng/mL — ABNORMAL HIGH (ref 0.00–0.06)

## 2011-03-30 LAB — CK TOTAL AND CKMB (NOT AT ARMC): Relative Index: INVALID (ref 0.0–2.5)

## 2011-03-30 LAB — MAGNESIUM: Magnesium: 2 mg/dL (ref 1.5–2.5)

## 2011-04-01 LAB — HEPARIN LEVEL (UNFRACTIONATED)
Heparin Unfractionated: 0.2 IU/mL — ABNORMAL LOW (ref 0.30–0.70)
Heparin Unfractionated: 0.3 IU/mL (ref 0.30–0.70)
Heparin Unfractionated: 0.43 IU/mL (ref 0.30–0.70)
Heparin Unfractionated: 0.61 IU/mL (ref 0.30–0.70)
Heparin Unfractionated: 0.79 IU/mL — ABNORMAL HIGH (ref 0.30–0.70)
Heparin Unfractionated: <0.1 IU/mL — ABNORMAL LOW (ref 0.30–0.70)
Heparin Unfractionated: <0.1 IU/mL — ABNORMAL LOW (ref 0.30–0.70)

## 2011-04-01 LAB — HEPATIC FUNCTION PANEL
ALT: 202 U/L — ABNORMAL HIGH (ref 0–53)
ALT: 40 U/L (ref 0–53)
AST: 165 U/L — ABNORMAL HIGH (ref 0–37)
AST: 223 U/L — ABNORMAL HIGH (ref 0–37)
Albumin: 3.4 g/dL — ABNORMAL LOW (ref 3.5–5.2)
Alkaline Phosphatase: 101 U/L (ref 39–117)
Bilirubin, Direct: 0.2 mg/dL (ref 0.0–0.3)
Bilirubin, Direct: 0.2 mg/dL (ref 0.0–0.3)
Indirect Bilirubin: 0.8 mg/dL (ref 0.3–0.9)
Total Bilirubin: 0.8 mg/dL (ref 0.3–1.2)
Total Bilirubin: 1 mg/dL (ref 0.3–1.2)

## 2011-04-01 LAB — POCT I-STAT 3, VENOUS BLOOD GAS (G3P V)
Acid-base deficit: 1 mmol/L (ref 0.0–2.0)
O2 Saturation: 56 %
TCO2: 26 mmol/L (ref 0–100)

## 2011-04-01 LAB — URINALYSIS, ROUTINE W REFLEX MICROSCOPIC
Bilirubin Urine: NEGATIVE
Ketones, ur: NEGATIVE mg/dL
Leukocytes, UA: NEGATIVE
Nitrite: NEGATIVE
Protein, ur: 30 mg/dL — AB

## 2011-04-01 LAB — CBC
HCT: 26.9 % — ABNORMAL LOW (ref 39.0–52.0)
HCT: 27.4 % — ABNORMAL LOW (ref 39.0–52.0)
HCT: 27.5 % — ABNORMAL LOW (ref 39.0–52.0)
HCT: 28 % — ABNORMAL LOW (ref 39.0–52.0)
HCT: 29.2 % — ABNORMAL LOW (ref 39.0–52.0)
HCT: 30.2 % — ABNORMAL LOW (ref 39.0–52.0)
HCT: 30.3 % — ABNORMAL LOW (ref 39.0–52.0)
HCT: 31.3 % — ABNORMAL LOW (ref 39.0–52.0)
HCT: 31.7 % — ABNORMAL LOW (ref 39.0–52.0)
HCT: 31.8 % — ABNORMAL LOW (ref 39.0–52.0)
HCT: 27.8 % — ABNORMAL LOW (ref 39.0–52.0)
HCT: 31.6 % — ABNORMAL LOW (ref 39.0–52.0)
Hemoglobin: 10.5 g/dL — ABNORMAL LOW (ref 13.0–17.0)
Hemoglobin: 10.5 g/dL — ABNORMAL LOW (ref 13.0–17.0)
Hemoglobin: 10.7 g/dL — ABNORMAL LOW (ref 13.0–17.0)
Hemoglobin: 10.7 g/dL — ABNORMAL LOW (ref 13.0–17.0)
Hemoglobin: 10.9 g/dL — ABNORMAL LOW (ref 13.0–17.0)
Hemoglobin: 11 g/dL — ABNORMAL LOW (ref 13.0–17.0)
Hemoglobin: 9.2 g/dL — ABNORMAL LOW (ref 13.0–17.0)
Hemoglobin: 9.2 g/dL — ABNORMAL LOW (ref 13.0–17.0)
Hemoglobin: 9.4 g/dL — ABNORMAL LOW (ref 13.0–17.0)
Hemoglobin: 9.6 g/dL — ABNORMAL LOW (ref 13.0–17.0)
Hemoglobin: 9.8 g/dL — ABNORMAL LOW (ref 13.0–17.0)
Hemoglobin: 10.4 g/dL — ABNORMAL LOW (ref 13.0–17.0)
Hemoglobin: 9.3 g/dL — ABNORMAL LOW (ref 13.0–17.0)
MCHC: 33.3 g/dL (ref 30.0–36.0)
MCHC: 33.5 g/dL (ref 30.0–36.0)
MCHC: 33.5 g/dL (ref 30.0–36.0)
MCHC: 33.6 g/dL (ref 30.0–36.0)
MCHC: 33.6 g/dL (ref 30.0–36.0)
MCHC: 33.8 g/dL (ref 30.0–36.0)
MCHC: 33.8 g/dL (ref 30.0–36.0)
MCHC: 33.8 g/dL (ref 30.0–36.0)
MCHC: 34.1 g/dL (ref 30.0–36.0)
MCHC: 34.2 g/dL (ref 30.0–36.0)
MCHC: 34.2 g/dL (ref 30.0–36.0)
MCV: 82.5 fL (ref 78.0–100.0)
MCV: 83.1 fL (ref 78.0–100.0)
MCV: 83.2 fL (ref 78.0–100.0)
MCV: 83.5 fL (ref 78.0–100.0)
MCV: 83.7 fL (ref 78.0–100.0)
MCV: 83.8 fL (ref 78.0–100.0)
MCV: 83.8 fL (ref 78.0–100.0)
MCV: 84.1 fL (ref 78.0–100.0)
MCV: 84.2 fL (ref 78.0–100.0)
MCV: 84.3 fL (ref 78.0–100.0)
Platelets: 177 10*3/uL (ref 150–400)
Platelets: 180 10*3/uL (ref 150–400)
Platelets: 187 10*3/uL (ref 150–400)
Platelets: 196 10*3/uL (ref 150–400)
Platelets: 216 10*3/uL (ref 150–400)
Platelets: 234 10*3/uL (ref 150–400)
Platelets: 236 10*3/uL (ref 150–400)
Platelets: 276 10*3/uL (ref 150–400)
Platelets: 146 10*3/uL — ABNORMAL LOW (ref 150–400)
Platelets: 171 10*3/uL (ref 150–400)
RBC: 3.19 MIL/uL — ABNORMAL LOW (ref 4.22–5.81)
RBC: 3.26 MIL/uL — ABNORMAL LOW (ref 4.22–5.81)
RBC: 3.29 MIL/uL — ABNORMAL LOW (ref 4.22–5.81)
RBC: 3.3 MIL/uL — ABNORMAL LOW (ref 4.22–5.81)
RBC: 3.47 MIL/uL — ABNORMAL LOW (ref 4.22–5.81)
RBC: 3.71 MIL/uL — ABNORMAL LOW (ref 4.22–5.81)
RBC: 3.8 MIL/uL — ABNORMAL LOW (ref 4.22–5.81)
RBC: 3.83 MIL/uL — ABNORMAL LOW (ref 4.22–5.81)
RDW: 17.1 % — ABNORMAL HIGH (ref 11.5–15.5)
RDW: 17.3 % — ABNORMAL HIGH (ref 11.5–15.5)
RDW: 17.5 % — ABNORMAL HIGH (ref 11.5–15.5)
RDW: 17.5 % — ABNORMAL HIGH (ref 11.5–15.5)
RDW: 17.5 % — ABNORMAL HIGH (ref 11.5–15.5)
RDW: 17.8 % — ABNORMAL HIGH (ref 11.5–15.5)
RDW: 17.8 % — ABNORMAL HIGH (ref 11.5–15.5)
RDW: 18.3 % — ABNORMAL HIGH (ref 11.5–15.5)
WBC: 6 10*3/uL (ref 4.0–10.5)
WBC: 6 10*3/uL (ref 4.0–10.5)
WBC: 6 10*3/uL (ref 4.0–10.5)
WBC: 6.4 10*3/uL (ref 4.0–10.5)
WBC: 6.5 10*3/uL (ref 4.0–10.5)
WBC: 7 10*3/uL (ref 4.0–10.5)
WBC: 7 10*3/uL (ref 4.0–10.5)
WBC: 7.3 10*3/uL (ref 4.0–10.5)
WBC: 5.9 10*3/uL (ref 4.0–10.5)
WBC: 7.9 10*3/uL (ref 4.0–10.5)

## 2011-04-01 LAB — BASIC METABOLIC PANEL
BUN: 17 mg/dL (ref 6–23)
BUN: 19 mg/dL (ref 6–23)
BUN: 22 mg/dL (ref 6–23)
BUN: 23 mg/dL (ref 6–23)
BUN: 26 mg/dL — ABNORMAL HIGH (ref 6–23)
BUN: 30 mg/dL — ABNORMAL HIGH (ref 6–23)
BUN: 28 mg/dL — ABNORMAL HIGH (ref 6–23)
BUN: 30 mg/dL — ABNORMAL HIGH (ref 6–23)
CO2: 22 mEq/L (ref 19–32)
CO2: 24 mEq/L (ref 19–32)
CO2: 28 mEq/L (ref 19–32)
CO2: 28 mEq/L (ref 19–32)
CO2: 30 mEq/L (ref 19–32)
CO2: 31 mEq/L (ref 19–32)
CO2: 28 mEq/L (ref 19–32)
CO2: 29 mEq/L (ref 19–32)
Calcium: 9.2 mg/dL (ref 8.4–10.5)
Calcium: 9.2 mg/dL (ref 8.4–10.5)
Calcium: 9.5 mg/dL (ref 8.4–10.5)
Calcium: 9.7 mg/dL (ref 8.4–10.5)
Chloride: 92 mEq/L — ABNORMAL LOW (ref 96–112)
Chloride: 93 mEq/L — ABNORMAL LOW (ref 96–112)
Chloride: 94 mEq/L — ABNORMAL LOW (ref 96–112)
Chloride: 99 mEq/L (ref 96–112)
Chloride: 102 mEq/L (ref 96–112)
Chloride: 97 mEq/L (ref 96–112)
Chloride: 98 mEq/L (ref 96–112)
Creatinine, Ser: 1.08 mg/dL (ref 0.4–1.5)
Creatinine, Ser: 1.3 mg/dL (ref 0.4–1.5)
Creatinine, Ser: 1.43 mg/dL (ref 0.4–1.5)
Creatinine, Ser: 1.74 mg/dL — ABNORMAL HIGH (ref 0.4–1.5)
GFR calc Af Amer: 48 mL/min — ABNORMAL LOW (ref >60)
GFR calc Af Amer: 54 mL/min — ABNORMAL LOW (ref >60)
GFR calc Af Amer: 55 mL/min — ABNORMAL LOW (ref >60)
GFR calc Af Amer: >60 mL/min (ref >60)
GFR calc Af Amer: >60 mL/min (ref >60)
GFR calc Af Amer: 56 mL/min — ABNORMAL LOW (ref >60)
GFR calc non Af Amer: 26 mL/min — ABNORMAL LOW (ref >60)
GFR calc non Af Amer: 44 mL/min — ABNORMAL LOW (ref >60)
GFR calc non Af Amer: 45 mL/min — ABNORMAL LOW (ref >60)
GFR calc non Af Amer: 55 mL/min — ABNORMAL LOW (ref >60)
GFR calc non Af Amer: >60 mL/min (ref >60)
GFR calc non Af Amer: >60 mL/min (ref >60)
Glucose, Bld: 100 mg/dL — ABNORMAL HIGH (ref 70–99)
Glucose, Bld: 106 mg/dL — ABNORMAL HIGH (ref 70–99)
Glucose, Bld: 143 mg/dL — ABNORMAL HIGH (ref 70–99)
Glucose, Bld: 87 mg/dL (ref 70–99)
Glucose, Bld: 89 mg/dL (ref 70–99)
Glucose, Bld: 91 mg/dL (ref 70–99)
Glucose, Bld: 92 mg/dL (ref 70–99)
Glucose, Bld: 97 mg/dL (ref 70–99)
Glucose, Bld: 99 mg/dL (ref 70–99)
Potassium: 3.6 mEq/L (ref 3.5–5.1)
Potassium: 4 mEq/L (ref 3.5–5.1)
Potassium: 4.1 mEq/L (ref 3.5–5.1)
Potassium: 4.2 mEq/L (ref 3.5–5.1)
Potassium: 4.2 mEq/L (ref 3.5–5.1)
Potassium: 4.7 mEq/L (ref 3.5–5.1)
Potassium: 4.7 mEq/L (ref 3.5–5.1)
Potassium: 3.6 mEq/L (ref 3.5–5.1)
Potassium: 4.1 mEq/L (ref 3.5–5.1)
Potassium: 4.5 mEq/L (ref 3.5–5.1)
Sodium: 127 mEq/L — ABNORMAL LOW (ref 135–145)
Sodium: 132 mEq/L — ABNORMAL LOW (ref 135–145)
Sodium: 132 mEq/L — ABNORMAL LOW (ref 135–145)
Sodium: 134 mEq/L — ABNORMAL LOW (ref 135–145)
Sodium: 136 mEq/L (ref 135–145)
Sodium: 138 mEq/L (ref 135–145)
Sodium: 135 mEq/L (ref 135–145)
Sodium: 135 mEq/L (ref 135–145)

## 2011-04-01 LAB — COMPREHENSIVE METABOLIC PANEL
ALT: 103 U/L — ABNORMAL HIGH (ref 0–53)
ALT: 78 U/L — ABNORMAL HIGH (ref 0–53)
ALT: 23 U/L (ref 0–53)
AST: 112 U/L — ABNORMAL HIGH (ref 0–37)
AST: 123 U/L — ABNORMAL HIGH (ref 0–37)
AST: 33 U/L (ref 0–37)
AST: 73 U/L — ABNORMAL HIGH (ref 0–37)
Albumin: 2.8 g/dL — ABNORMAL LOW (ref 3.5–5.2)
Albumin: 3.1 g/dL — ABNORMAL LOW (ref 3.5–5.2)
Albumin: 3.2 g/dL — ABNORMAL LOW (ref 3.5–5.2)
Albumin: 3.2 g/dL — ABNORMAL LOW (ref 3.5–5.2)
Alkaline Phosphatase: 96 U/L (ref 39–117)
Alkaline Phosphatase: 99 U/L (ref 39–117)
Alkaline Phosphatase: 81 U/L (ref 39–117)
BUN: 15 mg/dL (ref 6–23)
BUN: 17 mg/dL (ref 6–23)
BUN: 24 mg/dL — ABNORMAL HIGH (ref 6–23)
BUN: 25 mg/dL — ABNORMAL HIGH (ref 6–23)
CO2: 25 mEq/L (ref 19–32)
CO2: 26 mEq/L (ref 19–32)
CO2: 30 mEq/L (ref 19–32)
Calcium: 9.2 mg/dL (ref 8.4–10.5)
Calcium: 9.3 mg/dL (ref 8.4–10.5)
Calcium: 9.4 mg/dL (ref 8.4–10.5)
Calcium: 9.4 mg/dL (ref 8.4–10.5)
Chloride: 95 mEq/L — ABNORMAL LOW (ref 96–112)
Chloride: 96 mEq/L (ref 96–112)
Chloride: 96 mEq/L (ref 96–112)
Chloride: 103 mEq/L (ref 96–112)
Creatinine, Ser: 1.13 mg/dL (ref 0.4–1.5)
Creatinine, Ser: 1.13 mg/dL (ref 0.4–1.5)
Creatinine, Ser: 1.29 mg/dL (ref 0.4–1.5)
Creatinine, Ser: 1.57 mg/dL — ABNORMAL HIGH (ref 0.4–1.5)
Creatinine, Ser: 1.84 mg/dL — ABNORMAL HIGH (ref 0.4–1.5)
GFR calc Af Amer: 45 mL/min — ABNORMAL LOW (ref >60)
GFR calc Af Amer: 54 mL/min — ABNORMAL LOW (ref >60)
GFR calc Af Amer: 54 mL/min — ABNORMAL LOW (ref >60)
GFR calc Af Amer: >60 mL/min (ref >60)
GFR calc non Af Amer: 37 mL/min — ABNORMAL LOW (ref >60)
GFR calc non Af Amer: 44 mL/min — ABNORMAL LOW (ref >60)
GFR calc non Af Amer: 45 mL/min — ABNORMAL LOW (ref >60)
GFR calc non Af Amer: >60 mL/min (ref >60)
Glucose, Bld: 100 mg/dL — ABNORMAL HIGH (ref 70–99)
Glucose, Bld: 111 mg/dL — ABNORMAL HIGH (ref 70–99)
Glucose, Bld: 93 mg/dL (ref 70–99)
Glucose, Bld: 97 mg/dL (ref 70–99)
Glucose, Bld: 81 mg/dL (ref 70–99)
Potassium: 4.4 mEq/L (ref 3.5–5.1)
Potassium: 4.9 mEq/L (ref 3.5–5.1)
Potassium: 3.5 mEq/L (ref 3.5–5.1)
Sodium: 134 mEq/L — ABNORMAL LOW (ref 135–145)
Sodium: 134 mEq/L — ABNORMAL LOW (ref 135–145)
Sodium: 139 mEq/L (ref 135–145)
Total Bilirubin: 0.8 mg/dL (ref 0.3–1.2)
Total Bilirubin: 0.8 mg/dL (ref 0.3–1.2)
Total Bilirubin: 0.9 mg/dL (ref 0.3–1.2)
Total Bilirubin: 0.9 mg/dL (ref 0.3–1.2)
Total Bilirubin: 1 mg/dL (ref 0.3–1.2)
Total Protein: 6.6 g/dL (ref 6.0–8.3)
Total Protein: 7.1 g/dL (ref 6.0–8.3)
Total Protein: 6.6 g/dL (ref 6.0–8.3)

## 2011-04-01 LAB — PROTIME-INR
INR: 1.7 — ABNORMAL HIGH (ref 0.00–1.49)
INR: 2.1 — ABNORMAL HIGH (ref 0.00–1.49)
INR: 2.9 — ABNORMAL HIGH (ref 0.00–1.49)
INR: 3.2 — ABNORMAL HIGH (ref 0.00–1.49)
Prothrombin Time: 17.4 seconds — ABNORMAL HIGH (ref 11.6–15.2)
Prothrombin Time: 20.7 seconds — ABNORMAL HIGH (ref 11.6–15.2)
Prothrombin Time: 20.9 seconds — ABNORMAL HIGH (ref 11.6–15.2)
Prothrombin Time: 36.7 seconds — ABNORMAL HIGH (ref 11.6–15.2)
Prothrombin Time: 38.3 seconds — ABNORMAL HIGH (ref 11.6–15.2)
Prothrombin Time: 35.6 seconds — ABNORMAL HIGH (ref 11.6–15.2)

## 2011-04-01 LAB — DIFFERENTIAL
Basophils Absolute: 0 10*3/uL (ref 0.0–0.1)
Basophils Relative: 0 % (ref 0–1)
Eosinophils Absolute: 0.1 10*3/uL (ref 0.0–0.7)
Eosinophils Absolute: 0.1 10*3/uL (ref 0.0–0.7)
Eosinophils Relative: 1 % (ref 0–5)
Lymphs Abs: 0.8 10*3/uL (ref 0.7–4.0)
Monocytes Absolute: 0.7 10*3/uL (ref 0.1–1.0)
Monocytes Absolute: 0.7 10*3/uL (ref 0.1–1.0)
Monocytes Relative: 11 % (ref 3–12)
Monocytes Relative: 9 % (ref 3–12)
Neutro Abs: 4.3 10*3/uL (ref 1.7–7.7)

## 2011-04-01 LAB — BRAIN NATRIURETIC PEPTIDE
Pro B Natriuretic peptide (BNP): 1302 pg/mL — ABNORMAL HIGH (ref 0.0–100.0)
Pro B Natriuretic peptide (BNP): 373 pg/mL — ABNORMAL HIGH (ref 0.0–100.0)
Pro B Natriuretic peptide (BNP): 497 pg/mL — ABNORMAL HIGH (ref 0.0–100.0)
Pro B Natriuretic peptide (BNP): 599 pg/mL — ABNORMAL HIGH (ref 0.0–100.0)
Pro B Natriuretic peptide (BNP): 2690 pg/mL — ABNORMAL HIGH (ref 0.0–100.0)
Pro B Natriuretic peptide (BNP): 2878 pg/mL — ABNORMAL HIGH (ref 0.0–100.0)
Pro B Natriuretic peptide (BNP): 1439 pg/mL — ABNORMAL HIGH (ref 0.0–100.0)

## 2011-04-01 LAB — POCT I-STAT 3, ART BLOOD GAS (G3+)
Acid-Base Excess: 3 mmol/L — ABNORMAL HIGH (ref 0.0–2.0)
Bicarbonate: 26.2 mEq/L — ABNORMAL HIGH (ref 20.0–24.0)
O2 Saturation: 94 %
TCO2: 27 mmol/L (ref 0–100)
pCO2 arterial: 35.4 mmHg (ref 35.0–45.0)
pO2, Arterial: 66 mmHg — ABNORMAL LOW (ref 80.0–100.0)

## 2011-04-01 LAB — POCT CARDIAC MARKERS
CKMB, poc: 1.2 ng/mL (ref 1.0–8.0)
CKMB, poc: 1.2 ng/mL (ref 1.0–8.0)
Myoglobin, poc: 108 ng/mL (ref 12–200)
Troponin i, poc: <0.05 ng/mL (ref 0.00–0.09)
Troponin i, poc: <0.05 ng/mL (ref 0.00–0.09)

## 2011-04-01 LAB — LIPASE, BLOOD: Lipase: 23 U/L (ref 11–59)

## 2011-04-01 LAB — DIGOXIN LEVEL: Digoxin Level: 0.5 ng/mL — ABNORMAL LOW (ref 0.8–2.0)

## 2011-04-01 LAB — MAGNESIUM: Magnesium: 1.4 mg/dL — ABNORMAL LOW (ref 1.5–2.5)

## 2011-04-01 LAB — POTASSIUM: Potassium: 5.4 mEq/L — ABNORMAL HIGH (ref 3.5–5.1)

## 2011-04-02 LAB — COMPREHENSIVE METABOLIC PANEL
Alkaline Phosphatase: 89 U/L (ref 39–117)
BUN: 22 mg/dL (ref 6–23)
CO2: 32 mEq/L (ref 19–32)
Chloride: 97 mEq/L (ref 96–112)
Creatinine, Ser: 1.28 mg/dL (ref 0.4–1.5)
GFR calc non Af Amer: 56 mL/min — ABNORMAL LOW (ref >60)
Glucose, Bld: 96 mg/dL (ref 70–99)
Potassium: 4 mEq/L (ref 3.5–5.1)
Total Bilirubin: 1.2 mg/dL (ref 0.3–1.2)

## 2011-04-02 LAB — CBC
HCT: 29.6 % — ABNORMAL LOW (ref 39.0–52.0)
HCT: 30.8 % — ABNORMAL LOW (ref 39.0–52.0)
Hemoglobin: 10.5 g/dL — ABNORMAL LOW (ref 13.0–17.0)
Hemoglobin: 10.6 g/dL — ABNORMAL LOW (ref 13.0–17.0)
MCHC: 33.5 g/dL (ref 30.0–36.0)
MCHC: 33.7 g/dL (ref 30.0–36.0)
MCV: 84.8 fL (ref 78.0–100.0)
MCV: 85.1 fL (ref 78.0–100.0)
Platelets: 156 10*3/uL (ref 150–400)
Platelets: 173 10*3/uL (ref 150–400)
Platelets: 191 10*3/uL (ref 150–400)
RBC: 3.63 MIL/uL — ABNORMAL LOW (ref 4.22–5.81)
RBC: 3.77 MIL/uL — ABNORMAL LOW (ref 4.22–5.81)
RDW: 17.2 % — ABNORMAL HIGH (ref 11.5–15.5)
WBC: 5.5 10*3/uL (ref 4.0–10.5)
WBC: 6.4 10*3/uL (ref 4.0–10.5)
WBC: 6.6 10*3/uL (ref 4.0–10.5)

## 2011-04-02 LAB — TSH: TSH: 1.548 u[IU]/mL (ref 0.350–4.500)

## 2011-04-02 LAB — DIGOXIN LEVEL: Digoxin Level: 0.4 ng/mL — ABNORMAL LOW (ref 0.8–2.0)

## 2011-04-02 LAB — POCT I-STAT, CHEM 8
BUN: 22 mg/dL (ref 6–23)
Sodium: 140 mEq/L (ref 135–145)
TCO2: 25 mmol/L (ref 0–100)

## 2011-04-02 LAB — POCT CARDIAC MARKERS: Myoglobin, poc: 91.1 ng/mL (ref 12–200)

## 2011-04-02 LAB — HEPATIC FUNCTION PANEL
Albumin: 3.2 g/dL — ABNORMAL LOW (ref 3.5–5.2)
Total Bilirubin: 1.3 mg/dL — ABNORMAL HIGH (ref 0.3–1.2)
Total Protein: 6.9 g/dL (ref 6.0–8.3)

## 2011-04-02 LAB — PROTIME-INR
INR: 2.4 — ABNORMAL HIGH (ref 0.00–1.49)
INR: 2.7 — ABNORMAL HIGH (ref 0.00–1.49)
INR: 3.8 — ABNORMAL HIGH (ref 0.00–1.49)
Prothrombin Time: 27.4 seconds — ABNORMAL HIGH (ref 11.6–15.2)
Prothrombin Time: 30.3 seconds — ABNORMAL HIGH (ref 11.6–15.2)
Prothrombin Time: 39 seconds — ABNORMAL HIGH (ref 11.6–15.2)
Prothrombin Time: 40.9 seconds — ABNORMAL HIGH (ref 11.6–15.2)

## 2011-04-02 LAB — DIFFERENTIAL
Eosinophils Absolute: 0.1 10*3/uL (ref 0.0–0.7)
Lymphocytes Relative: 13 % (ref 12–46)
Lymphs Abs: 0.7 10*3/uL (ref 0.7–4.0)
Neutro Abs: 3.9 10*3/uL (ref 1.7–7.7)
Neutrophils Relative %: 71 % (ref 43–77)

## 2011-04-02 LAB — BASIC METABOLIC PANEL
BUN: 18 mg/dL (ref 6–23)
BUN: 19 mg/dL (ref 6–23)
CO2: 25 mEq/L (ref 19–32)
CO2: 29 mEq/L (ref 19–32)
Calcium: 8.5 mg/dL (ref 8.4–10.5)
Chloride: 107 mEq/L (ref 96–112)
Creatinine, Ser: 1.17 mg/dL (ref 0.4–1.5)
GFR calc Af Amer: >60 mL/min (ref >60)
GFR calc non Af Amer: 53 mL/min — ABNORMAL LOW (ref >60)
GFR calc non Af Amer: >60 mL/min (ref >60)
Glucose, Bld: 121 mg/dL — ABNORMAL HIGH (ref 70–99)
Glucose, Bld: 82 mg/dL (ref 70–99)
Glucose, Bld: 93 mg/dL (ref 70–99)
Potassium: 4.4 mEq/L (ref 3.5–5.1)
Sodium: 140 mEq/L (ref 135–145)

## 2011-04-02 LAB — MAGNESIUM: Magnesium: 1.7 mg/dL (ref 1.5–2.5)

## 2011-04-02 LAB — CARDIAC PANEL(CRET KIN+CKTOT+MB+TROPI)
CK, MB: 1.8 ng/mL (ref 0.3–4.0)
Relative Index: INVALID (ref 0.0–2.5)
Total CK: 53 U/L (ref 7–232)
Troponin I: 0.05 ng/mL (ref 0.00–0.06)

## 2011-04-02 LAB — BRAIN NATRIURETIC PEPTIDE
Pro B Natriuretic peptide (BNP): 1944 pg/mL — ABNORMAL HIGH (ref 0.0–100.0)
Pro B Natriuretic peptide (BNP): 2034 pg/mL — ABNORMAL HIGH (ref 0.0–100.0)

## 2011-04-02 LAB — HEMOCCULT GUIAC POC 1CARD (OFFICE): Fecal Occult Bld: NEGATIVE

## 2011-04-02 LAB — LIPASE, BLOOD: Lipase: 21 U/L (ref 11–59)

## 2011-04-02 LAB — IRON AND TIBC
Iron: 54 ug/dL (ref 42–135)
UIBC: 353 ug/dL

## 2011-04-02 LAB — RETICULOCYTES: RBC.: 3.64 MIL/uL — ABNORMAL LOW (ref 4.22–5.81)

## 2011-04-02 LAB — CK TOTAL AND CKMB (NOT AT ARMC): Relative Index: INVALID (ref 0.0–2.5)

## 2011-04-07 LAB — CBC
HCT: 33.8 % — ABNORMAL LOW (ref 39.0–52.0)
Hemoglobin: 11 g/dL — ABNORMAL LOW (ref 13.0–17.0)
Hemoglobin: 8.4 g/dL — ABNORMAL LOW (ref 13.0–17.0)
MCHC: 32.8 g/dL (ref 30.0–36.0)
MCHC: 33.1 g/dL (ref 30.0–36.0)
MCHC: 33.8 g/dL (ref 30.0–36.0)
MCV: 84.5 fL (ref 78.0–100.0)
MCV: 86.9 fL (ref 78.0–100.0)
Platelets: 157 10*3/uL (ref 150–400)
Platelets: 202 10*3/uL (ref 150–400)
Platelets: 205 10*3/uL (ref 150–400)
RBC: 3.61 MIL/uL — ABNORMAL LOW (ref 4.22–5.81)
RBC: 3.96 MIL/uL — ABNORMAL LOW (ref 4.22–5.81)
RDW: 16 % — ABNORMAL HIGH (ref 11.5–15.5)
RDW: 16 % — ABNORMAL HIGH (ref 11.5–15.5)
RDW: 16.1 % — ABNORMAL HIGH (ref 11.5–15.5)

## 2011-04-07 LAB — BASIC METABOLIC PANEL
BUN: 17 mg/dL (ref 6–23)
CO2: 26 mEq/L (ref 19–32)
CO2: 28 mEq/L (ref 19–32)
Chloride: 101 mEq/L (ref 96–112)
Chloride: 93 mEq/L — ABNORMAL LOW (ref 96–112)
Creatinine, Ser: 1.07 mg/dL (ref 0.4–1.5)
GFR calc Af Amer: >60 mL/min (ref >60)
Glucose, Bld: 94 mg/dL (ref 70–99)
Glucose, Bld: 98 mg/dL (ref 70–99)
Potassium: 3.5 mEq/L (ref 3.5–5.1)

## 2011-04-07 LAB — COMPREHENSIVE METABOLIC PANEL
ALT: 17 U/L (ref 0–53)
AST: 21 U/L (ref 0–37)
Albumin: 2.9 g/dL — ABNORMAL LOW (ref 3.5–5.2)
Albumin: 3.1 g/dL — ABNORMAL LOW (ref 3.5–5.2)
Alkaline Phosphatase: 53 U/L (ref 39–117)
Alkaline Phosphatase: 56 U/L (ref 39–117)
BUN: 20 mg/dL (ref 6–23)
BUN: 21 mg/dL (ref 6–23)
Chloride: 100 mEq/L (ref 96–112)
Chloride: 104 mEq/L (ref 96–112)
Creatinine, Ser: 1.31 mg/dL (ref 0.4–1.5)
Glucose, Bld: 106 mg/dL — ABNORMAL HIGH (ref 70–99)
Potassium: 3.2 mEq/L — ABNORMAL LOW (ref 3.5–5.1)
Potassium: 3.6 mEq/L (ref 3.5–5.1)
Total Bilirubin: 0.8 mg/dL (ref 0.3–1.2)
Total Bilirubin: 1.2 mg/dL (ref 0.3–1.2)
Total Protein: 6.3 g/dL (ref 6.0–8.3)

## 2011-04-07 LAB — URINALYSIS, ROUTINE W REFLEX MICROSCOPIC
Bilirubin Urine: NEGATIVE
Glucose, UA: NEGATIVE mg/dL
Hgb urine dipstick: NEGATIVE
Ketones, ur: NEGATIVE mg/dL
Protein, ur: NEGATIVE mg/dL

## 2011-04-07 LAB — CROSSMATCH

## 2011-04-07 LAB — T3: T3, Total: 65.4 ng/dl — ABNORMAL LOW (ref 80.0–204.0)

## 2011-04-07 LAB — DIFFERENTIAL
Basophils Absolute: 0 10*3/uL (ref 0.0–0.1)
Basophils Relative: 0 % (ref 0–1)
Basophils Relative: 1 % (ref 0–1)
Eosinophils Absolute: 0.1 10*3/uL (ref 0.0–0.7)
Lymphocytes Relative: 18 % (ref 12–46)
Monocytes Absolute: 0.9 10*3/uL (ref 0.1–1.0)
Monocytes Relative: 14 % — ABNORMAL HIGH (ref 3–12)
Neutro Abs: 4 10*3/uL (ref 1.7–7.7)
Neutrophils Relative %: 61 % (ref 43–77)
Neutrophils Relative %: 66 % (ref 43–77)

## 2011-04-07 LAB — IRON AND TIBC
Iron: 30 ug/dL — ABNORMAL LOW (ref 42–135)
Saturation Ratios: 8 % — ABNORMAL LOW (ref 20–55)
TIBC: 383 ug/dL (ref 215–435)
UIBC: 353 ug/dL

## 2011-04-07 LAB — CARDIAC PANEL(CRET KIN+CKTOT+MB+TROPI)
CK, MB: 1 ng/mL (ref 0.3–4.0)
Relative Index: INVALID (ref 0.0–2.5)
Total CK: 24 U/L (ref 7–232)
Total CK: 29 U/L (ref 7–232)

## 2011-04-07 LAB — LIPID PANEL
LDL Cholesterol: 156 mg/dL — ABNORMAL HIGH (ref 0–99)
Triglycerides: 144 mg/dL (ref <150)
VLDL: 29 mg/dL (ref 0–40)

## 2011-04-07 LAB — T4, FREE: Free T4: 1.19 ng/dL (ref 0.89–1.80)

## 2011-04-07 LAB — PROTIME-INR
INR: 1.5 (ref 0.00–1.49)
INR: 2.9 — ABNORMAL HIGH (ref 0.00–1.49)
Prothrombin Time: 19.1 seconds — ABNORMAL HIGH (ref 11.6–15.2)
Prothrombin Time: 32.6 seconds — ABNORMAL HIGH (ref 11.6–15.2)

## 2011-04-07 LAB — TROPONIN I: Troponin I: 0.02 ng/mL (ref 0.00–0.06)

## 2011-04-07 LAB — CK TOTAL AND CKMB (NOT AT ARMC)
CK, MB: 1.3 ng/mL (ref 0.3–4.0)
Total CK: 37 U/L (ref 7–232)

## 2011-04-07 LAB — BRAIN NATRIURETIC PEPTIDE: Pro B Natriuretic peptide (BNP): 1064 pg/mL — ABNORMAL HIGH (ref 0.0–100.0)

## 2011-04-07 LAB — VITAMIN B12: Vitamin B-12: 700 pg/mL (ref 211–911)

## 2011-04-08 LAB — DIFFERENTIAL
Basophils Relative: 0 % (ref 0–1)
Eosinophils Absolute: 0.1 10*3/uL (ref 0.0–0.7)
Eosinophils Relative: 1 % (ref 0–5)
Monocytes Relative: 10 % (ref 3–12)
Neutrophils Relative %: 67 % (ref 43–77)

## 2011-04-08 LAB — HEPATIC FUNCTION PANEL
ALT: 11 U/L (ref 0–53)
Alkaline Phosphatase: 58 U/L (ref 39–117)
Bilirubin, Direct: 0.1 mg/dL (ref 0.0–0.3)
Indirect Bilirubin: 0.6 mg/dL (ref 0.3–0.9)
Total Bilirubin: 0.7 mg/dL (ref 0.3–1.2)

## 2011-04-08 LAB — POCT I-STAT 3, ART BLOOD GAS (G3+)
Bicarbonate: 23.8 mEq/L (ref 20.0–24.0)
Bicarbonate: 24.2 mEq/L — ABNORMAL HIGH (ref 20.0–24.0)
O2 Saturation: 98 %
O2 Saturation: 99 %
TCO2: 25 mmol/L (ref 0–100)
TCO2: 25 mmol/L (ref 0–100)
pCO2 arterial: 37.3 mmHg (ref 35.0–45.0)
pO2, Arterial: 102 mmHg — ABNORMAL HIGH (ref 80.0–100.0)
pO2, Arterial: 147 mmHg — ABNORMAL HIGH (ref 80.0–100.0)

## 2011-04-08 LAB — COMPREHENSIVE METABOLIC PANEL
ALT: 15 U/L (ref 0–53)
AST: 29 U/L (ref 0–37)
CO2: 23 mEq/L (ref 19–32)
Chloride: 100 mEq/L (ref 96–112)
GFR calc Af Amer: 56 mL/min — ABNORMAL LOW (ref >60)
GFR calc non Af Amer: 46 mL/min — ABNORMAL LOW (ref >60)
Glucose, Bld: 190 mg/dL — ABNORMAL HIGH (ref 70–99)
Sodium: 138 mEq/L (ref 135–145)
Total Bilirubin: 1 mg/dL (ref 0.3–1.2)

## 2011-04-08 LAB — POTASSIUM: Potassium: 3.7 mEq/L (ref 3.5–5.1)

## 2011-04-08 LAB — BASIC METABOLIC PANEL
BUN: 18 mg/dL (ref 6–23)
BUN: 23 mg/dL (ref 6–23)
BUN: 28 mg/dL — ABNORMAL HIGH (ref 6–23)
CO2: 28 mEq/L (ref 19–32)
CO2: 29 mEq/L (ref 19–32)
Chloride: 100 mEq/L (ref 96–112)
Chloride: 102 mEq/L (ref 96–112)
Chloride: 103 mEq/L (ref 96–112)
Chloride: 95 mEq/L — ABNORMAL LOW (ref 96–112)
Chloride: 96 mEq/L (ref 96–112)
Chloride: 99 mEq/L (ref 96–112)
Creatinine, Ser: 1.1 mg/dL (ref 0.4–1.5)
GFR calc Af Amer: >60 mL/min (ref >60)
GFR calc Af Amer: >60 mL/min (ref >60)
GFR calc Af Amer: >60 mL/min (ref >60)
GFR calc non Af Amer: 52 mL/min — ABNORMAL LOW (ref >60)
Glucose, Bld: 100 mg/dL — ABNORMAL HIGH (ref 70–99)
Glucose, Bld: 113 mg/dL — ABNORMAL HIGH (ref 70–99)
Potassium: 3.2 mEq/L — ABNORMAL LOW (ref 3.5–5.1)
Potassium: 3.3 mEq/L — ABNORMAL LOW (ref 3.5–5.1)
Potassium: 3.6 mEq/L (ref 3.5–5.1)
Potassium: 3.7 mEq/L (ref 3.5–5.1)
Potassium: 4 mEq/L (ref 3.5–5.1)
Potassium: 4.1 mEq/L (ref 3.5–5.1)
Sodium: 132 mEq/L — ABNORMAL LOW (ref 135–145)
Sodium: 136 mEq/L (ref 135–145)
Sodium: 137 mEq/L (ref 135–145)
Sodium: 138 mEq/L (ref 135–145)

## 2011-04-08 LAB — CBC
HCT: 29.3 % — ABNORMAL LOW (ref 39.0–52.0)
HCT: 33.6 % — ABNORMAL LOW (ref 39.0–52.0)
HCT: 35 % — ABNORMAL LOW (ref 39.0–52.0)
HCT: 38.1 % — ABNORMAL LOW (ref 39.0–52.0)
Hemoglobin: 10.5 g/dL — ABNORMAL LOW (ref 13.0–17.0)
Hemoglobin: 11.6 g/dL — ABNORMAL LOW (ref 13.0–17.0)
Hemoglobin: 12 g/dL — ABNORMAL LOW (ref 13.0–17.0)
MCHC: 33.8 g/dL (ref 30.0–36.0)
MCHC: 34.2 g/dL (ref 30.0–36.0)
MCHC: 34.2 g/dL (ref 30.0–36.0)
MCV: 88.2 fL (ref 78.0–100.0)
MCV: 88.3 fL (ref 78.0–100.0)
MCV: 88.4 fL (ref 78.0–100.0)
MCV: 88.9 fL (ref 78.0–100.0)
Platelets: 154 10*3/uL (ref 150–400)
Platelets: 195 10*3/uL (ref 150–400)
Platelets: 251 10*3/uL (ref 150–400)
RBC: 3.3 MIL/uL — ABNORMAL LOW (ref 4.22–5.81)
RBC: 3.48 MIL/uL — ABNORMAL LOW (ref 4.22–5.81)
RBC: 3.96 MIL/uL — ABNORMAL LOW (ref 4.22–5.81)
WBC: 8.6 10*3/uL (ref 4.0–10.5)
WBC: 8.8 10*3/uL (ref 4.0–10.5)
WBC: 9 10*3/uL (ref 4.0–10.5)

## 2011-04-08 LAB — POCT CARDIAC MARKERS
CKMB, poc: 2 ng/mL (ref 1.0–8.0)
Myoglobin, poc: 73.6 ng/mL (ref 12–200)
Troponin i, poc: <0.05 ng/mL (ref 0.00–0.09)

## 2011-04-08 LAB — POCT I-STAT, CHEM 8
Calcium, Ion: 1.13 mmol/L (ref 1.12–1.32)
Chloride: 104 mEq/L (ref 96–112)
Creatinine, Ser: 1.4 mg/dL (ref 0.4–1.5)
Glucose, Bld: 201 mg/dL — ABNORMAL HIGH (ref 70–99)
HCT: 41 % (ref 39.0–52.0)
Potassium: 3.3 mEq/L — ABNORMAL LOW (ref 3.5–5.1)

## 2011-04-08 LAB — CARDIAC PANEL(CRET KIN+CKTOT+MB+TROPI)
CK, MB: 2.1 ng/mL (ref 0.3–4.0)
Relative Index: INVALID (ref 0.0–2.5)
Relative Index: INVALID (ref 0.0–2.5)
Troponin I: 0.01 ng/mL (ref 0.00–0.06)
Troponin I: 0.02 ng/mL (ref 0.00–0.06)

## 2011-04-08 LAB — BRAIN NATRIURETIC PEPTIDE
Pro B Natriuretic peptide (BNP): 1071 pg/mL — ABNORMAL HIGH (ref 0.0–100.0)
Pro B Natriuretic peptide (BNP): 1345 pg/mL — ABNORMAL HIGH (ref 0.0–100.0)
Pro B Natriuretic peptide (BNP): 839 pg/mL — ABNORMAL HIGH (ref 0.0–100.0)

## 2011-04-08 LAB — DIGOXIN LEVEL: Digoxin Level: 0.4 ng/mL — ABNORMAL LOW (ref 0.8–2.0)

## 2011-04-08 LAB — PROTIME-INR
Prothrombin Time: 15 seconds (ref 11.6–15.2)
Prothrombin Time: 16.4 seconds — ABNORMAL HIGH (ref 11.6–15.2)
Prothrombin Time: 18.6 seconds — ABNORMAL HIGH (ref 11.6–15.2)

## 2011-04-08 LAB — HEPARIN LEVEL (UNFRACTIONATED): Heparin Unfractionated: 0.41 IU/mL (ref 0.30–0.70)

## 2011-04-08 LAB — HEMOGLOBIN A1C
Hgb A1c MFr Bld: 5.9 % (ref 4.6–6.1)
Mean Plasma Glucose: 123 mg/dL

## 2011-05-06 NOTE — Discharge Summary (Signed)
[name], [name] NO.:  [id number]   MEDICAL RECORD NO.:  [id number]          PATIENT TYPE:  INP   LOCATION:  2028                         FACILITY:  [facility]   PHYSICIAN:  Richard A. Alanda Amass, M.D.DATE OF BIRTH:  Apr 12, 1941   DATE OF ADMISSION:  06/23/2009  DATE OF DISCHARGE:  07/01/2009                               DISCHARGE SUMMARY   ADDENDUM   In addition to the above medications, [name] was also discharged  on Protonix 40 mg b.i.d., lorazepam 0.5 mg b.i.d. p.r.n., and Maxzide  400 mg b.i.d., and nitroglycerin sublingual p.r.n.      Abelino Derrick, P.A.      Richard A. Alanda Amass, M.D.  Electronically Signed    LKK/MEDQ  D:  07/01/2009  T:  07/02/2009  Job:  [id number]

## 2011-05-06 NOTE — Cardiovascular Report (Signed)
NAMEDERREK, PUFF NO.:  [id number]   MEDICAL RECORD NO.:  [id number]           PATIENT TYPE:   LOCATION:                                 FACILITY:   PHYSICIAN:  Vonna Kotyk R. Jacinto Halim, MD       DATE OF BIRTH:  05-18-1941   DATE OF PROCEDURE:  05/10/2009  DATE OF DISCHARGE:                            CARDIAC CATHETERIZATION   PROCEDURE PERFORMED:  1. Right heart catheterization.  2. Left heart catheterization including:      a.     Left ventriculography.      b.     Selective right and left coronary arteriography.      c.     Saphenous vein graft and left internal mammary       arteriography.   INDICATIONS:  [name] is a 70 year old gentleman with known  coronary artery disease.  He has undergone coronary artery bypass  grafting x2 in the past.  He has had recurrent admission to the hospital  with acute on chronic systolic and diastolic heart failure.  He also had  moderate aortic stenosis with severe LV systolic dysfunction.  He is  status post BiV ICD implantation.  Given his recurrent admission to the  hospital, the severity of aortic valve stenosis needed to be re-looked  at and also to see if there has been progression of coronary disease, he  is now brought to the cardiac catheterization lab.   Right heart catheterization hemodynamic data RA pressures 6/6, mean 4  mmHg.   RV pressure 41/2, end-diastolic pressure 5 mmHg.   PA pressure 37/30 with a mean 22 mmHg.   Pulmonary capillary wedge 12/14, mean of 10 mmHg.   PA saturation 56%, aortic saturation 94%.   Cardiac output of 5.0 with a cardiac index of 2.4 by Fick and cardiac  output of 4.35 and with a cardiac index of 2.09 by thermodilution.   HEMODYNAMIC DATA:  Left heart catheterization:  The left ventricular  pressure was 139/8 with an end-diastolic pressure of 18 mmHg.  Aortic  pressure was 100/52 with a mean of 71 mmHg.   The peak-to-peak gradient was anywhere from 33-39 mmHg from LV to  aorta  and LV to femoral artery, respectively.   The mean gradient was 21.1 mmHg across the aortic valve.   Calculated aortic valve area was 0.89 sq. cm.   ANGIOGRAPHIC DATA:  1. Left ventricular:  Left ventricular systolic function was markedly      depressed with global hypokinesis and inferior wall akinesis with      ejection fraction of 22, at most 25%.  There was no significant      mitral regurgitation.  2. Right coronary artery:  The RCA is occluded in the proximal      segment.  Distally, it is supplied by the saphenous vein graft.  3. SVG to PDA:  SVG to PDA is widely patent.  It retrogradely fills      the PLA which has a mid 80-85% stenosis minimally worsened since      cardiac catheterization in 2008.  4. Left main  coronary artery:  Left main coronary artery is occluded      in the proximal segment.  5. Circumflex coronary artery:  Circumflex coronary artery is a large-      caliber vessel.  It is supplied by saphenous vein graft and also by      a skipped graft free RIMA from the saphenous vein graft to the      obtuse marginal.  6. SVG to ramus intermediate:  SVG to ramus intermediate is widely      patent.  7. The RIMA from SVG to OM-2:  The free skip RIMA from the SVG to OM-2      is widely patent.  8. LAD:  LAD is a moderate-to-large-caliber vessel.  It is occluded in      the proximal segment.  The diagonal 1 is large and is supplied by      saphenous vein graft and the distal LAD is supplied by LIMA.  9. SVG to D1:  SVG to D1 is widely patent.   LIMA to LAD:  LIMA to LAD is widely patent.   IMPRESSION:  1. One-vessel coronary artery disease (PLA with 80-85% stenosis).      This has minimally progressed since 2008.  This does not explain      recurrent heart failure.  2. Severe left ventricular systolic dysfunction, ejection fraction      around 20% with inferior akinesis.  No significant mitral      regurgitation.  3. Moderate pulmonary hypertension with  preserved cardiac output.  4. Peak-to-peak gradient of 33-36 mmHg across the aortic valve by      aortic valve pullback and left ventricle to femoral artery      respectively.  Mean gradient of 21.1 with an aortic valve area      calculated at 0.89 sq. cm.  5. Moderate pulmonary hypertension   Preserved cardiac output and cardiac index.   RECOMMENDATIONS:  We will have TCTS see the patient regarding aortic  valve replacement.  Small posterolateral artery disease cannot explain  his recurrent heart failure.  I suspect the aortic valve stenosis is  contributing significantly to his recurrent heart failure.   A total of 85 mL of contrast was utilized for diagnostic procedure.   TECHNIQUE OF THE PROCEDURE:  Under usual sterile precautions using a 6-  French right femoral arterial access and a 7-French right femoral venous  access, left and right heart catheterization was performed.   Right heart catheterization was performed using a balloon-tipped Swan-  Ganz catheter which was easily advanced to the pulmonary catheter wedge  position.  Hemodynamics were carefully analyzed.  Waveforms were  carefully analyzed.  Catheter pulled out of the body.   A 6-French multipurpose B2 catheter was advanced into the ascending  aorta and selective left and right coronary arteriography and saphenous  vein graft and left internal mammary arteriography was performed.  Catheter then pulled out of the body.   A 6-French AL-1 catheter was utilized.  With the help of a Glidewire, I  was able to cross the moderate-to-severely calcified aortic valve and  exchanged it over a 0.034th of an inch 300-cm J-wire to a 5-French  pigtail catheter.  Left ventriculography was performed in the RAO  projection.   The catheter was carefully pulled back, and pullback pressure gradients  were recorded.  Prior to this, left ventricle to right femoral arterial  pressure gradient was also recorded.  The catheter was then  pulled out  of the body.  The patient tolerated the procedure.  No immediate  complication was noted.  I greatly appreciate the technical assistant  given to me by very advanced RRT in the cath lab.      Cristy Hilts. Jacinto Halim, MD  Electronically Signed     JRG/MEDQ  D:  05/10/2009  T:  05/10/2009  Job:  [id number]

## 2011-05-06 NOTE — Consult Note (Signed)
[name], [name] NO.:  [id number]   MEDICAL RECORD NO.:  [id number]          PATIENT TYPE:  INP   LOCATION:  4731                         FACILITY:  [facility]   PHYSICIAN:  [name], M.D.   DATE OF BIRTH:  1941-06-21   DATE OF CONSULTATION:  11/14/2008  DATE OF DISCHARGE:                                 CONSULTATION   REASON FOR CONSULTATION:  Heme-positive stool, black colored stool, and  abdominal pain.   HISTORY:  This patient is a 70 year old male who saw Dr. Ewing [name] in the  office a couple of weeks ago with complaints of upper abdominal pain and  loose diarrheal stools.  An abdominal ultrasound was done according to  the patient.  Looking on e-chart, I do not see the report.  The patient  states that he has been noticing dark-colored stools and when checked  here in the hospital, they were found to be heme positive.  The stool  was reported as brown in color according to the note today but it was  definitely heme-positive.  The patient has a history of drinking  alcohol.  He gives no history of peptic ulcer disease.  He is on chronic  Coumadin therapy.  He denies hematemesis or hematochezia.   PAST HISTORY:  1. Coronary artery disease status post CABG in 1993 with a redo in      2000.  2. Ischemic cardiomyopathy with ejection fraction between 35 and 45%.  3. Mild aortic stenosis.  4. History of congestive heart failure.  5. History of a ICD implanted in 2008.  6. Hypertension.  7. Hyperlipidemia.  8. Peripheral vascular disease.  9. Atrial fibrillation.  10.Atrial flutter.  11.Mild renal insufficiency.  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  13.Gout.  14.History of CVA.  15.History of left carotid endarterectomy.   ALLERGIES:  LIPITOR.   Medications noted on chart.   SOCIAL HISTORY:  He does not smoke, does drink heavy alcohol.   SYSTEMS REVIEW:  Currently not complaining of any chest pain or  shortness of  breath.  No other specific complaints at this time.  The  patient states he has been in and out of the hospital in the last couple  of weeks for heart failure and was supposed to followup with Dr. Ewing [name].   PHYSICAL EXAMINATION:  VITAL SIGNS:  Stable.  He is in no distress.  HEENT:  Nonicteric.  NECK:  Supple.  HEART:  Irregular, grade 2/6 systolic murmur heard.  LUNGS:  Clear.  ABDOMEN:  Soft, nontender, no hepatosplenomegaly.  EXTREMITIES:  No edema.   IMPRESSION:  1. Multiple medical problems as stated above.  2. Heme-positive stool with the patient reporting intermittent black      colored stool however, stool was reported today to be brown in      color.   Review of lab data shows that his hemoglobin was 10.1 on November 11, 2008.  Prothrombin time today is 24.5 with an INR of 2.1. His BNP is  527.   PLAN:  Because of his epigastric pain, intermittent dark colored  stools,  and heme positivity, I will go ahead and proceed with a diagnostic EGD  tomorrow to see what that shows.  He tells me that he has not had his  colon checked.  I think that in order to do that, we will need to get  him off of Coumadin.  I will proceed initially and therefore with a EGD  tomorrow.           ______________________________  [name], M.D.     SFG/MEDQ  D:  11/14/2008  T:  11/15/2008  Job:  [id number]   cc:   [name], M.D.  [name], M.D.

## 2011-05-06 NOTE — Assessment & Plan Note (Signed)
[facility]                         ELECTROPHYSIOLOGY OFFICE NOTE   KEVIN, SPACE                    MRN:          [id number]  DATE:10/06/2007                            DOB:          1941-08-05    Mr. Kumpf was seen in the clinic today on the 15th of October, 2008,  for a wound check of his newly implanted Medtronic Model [phone] Maximo.  Date of implant was September 14, 2007, for ischemic cardiomyopathy.  On  interrogation of his device today, his battery voltage is 3.21 with a  charge time of 8.33 seconds.  P waves measured 3.5 millivolts with an  atrial capture threshold of 1 volt a 0.7 milliseconds and an atrial lead  impedance of 560 ohms.  R waves were 13.5 millivolts with a right  ventricular pacing threshold of 1 volt at 2 milliseconds and a right  ventricular lead impedance of 440 ohms.  Left ventricular pacing  threshold was 1 volt at 0.6 milliseconds with a left ventricular lead  impedance of 544 ohms.  Shock impedance was 39.  There were no  ventricular episodes noted since implant date.  There were 1164 mode  switch episodes noted.  His underlying rhythm today was a sinus rhythm.  He is on Coumadin therapy, although it has been on hold since the 13th  of this month per Corine Shelter, at [facility],  because of some swelling at the site, there is no redness.  I did have  Dr. Graciela Husbands look at the swelling, he thought it was going to be okay and  he released him to go back to follow at [facility]  with Dr. Alanda Amass as previously planned.  No changes were made in his  parameters.  He ventricularly paces at 98.6% of the time.      Altha Harm, LPN  Electronically Signed      Doylene Canning. [name], MD  Electronically Signed   PO/MedQ  DD: 10/06/2007  DT: 10/07/2007  Job #: [id number]

## 2011-05-06 NOTE — Discharge Summary (Signed)
[name], [name] NO.:  [id number]   MEDICAL RECORD NO.:  [id number]          PATIENT TYPE:  INP   LOCATION:  2021                         FACILITY:  [facility]   PHYSICIAN:  Richard A. Alanda Amass, M.D.DATE OF BIRTH:  12-Jul-1941   DATE OF ADMISSION:  05/05/2009  DATE OF DISCHARGE:  05/21/2009                               DISCHARGE SUMMARY   DISCHARGE DIAGNOSES:  1. Recurrent congestive heart failure, stable at discharge.  2. Severe aortic stenosis documented by catheterization and      transesophageal echocardiography this admission.  3. Ischemic cardiomyopathy with an EF of 25%.  4. Known coronary disease with previous coronary artery bypass      grafting in 1993 and again in 2000, patent grafts at      catheterization this admission.  5. Chronic renal insufficiency with a stable creatinine at discharge      of 1.1.  6. Paroxysmal atrial fibrillation, previous radiofrequency ablation in      February 2006 by Dr. Severiano Gilbert.  7. History of Medtronic Bi-V ICD implant.  8. Elevated LFTs, amiodarone discontinued this admission.  9. Peripheral vascular disease with previous left carotid      endarterectomy in October 2003 with Dopplers this admission showing      no significant internal carotid artery stenosis.  10.Gastrointestinal bleed, February 2010 followed by Dr. Ewing Schlein.  11.Chronic renal insufficiency, transient worsening this admission      after his catheterization, but now stable.  12.Dental extraction this admission.  13.Past history of gout.  14.Moderate pulmonary hypertension by catheterization this admission.   HOSPITAL COURSE:  [name] is a 70 year old male followed by Dr.  Alanda Amass and Dr. Tyrone Sage with a long complex cardiac history.  He had  bypass surgery in 1993 after a failed RCA PCI.  He had redo surgery in  2000 with 4 grafts placed.  He has had several catheterizations since.  He has had recurrent admissions for heart failure and PAF in the  last  year.  He does have aortic stenosis which we feel is a contributing.  He  was readmitted May 05, 2009.  Catheterization was done on May 10, 2009,  showed patent SVG to the PDA, patent RIMA to the OM, patent SVG to the  ramus intermedius, patent LIMA to the LAD and patent vein graft to the  first diagonal.  His EF at catheterization was 20-25%.  Aortic valve  area was 0.89 with a peak gradient at cath of 36.  The patient was seen  in consult by Dr. Tyrone Sage.  A question was whether or not he was a  candidate for redo surgery which would be his third surgery or possibly  a percutaneous aortic valve replacement.  Ultimately, I believe Dr.  Tyrone Sage felt that he was a very high risk for redo open heart surgery.  Percutaneous valve replacement or repair is being investigated.  In the  interim, the patient was treated for heart failure.  He was placed on  dobutamine.  He did have transient elevation in his creatinine, but this  stabilized before discharge.  His creatinine peaked at  2.5 on the 24.  His LFTs were also elevated this admission and his amiodarone was  stopped.  These have normalized by discharge.  He was seen in consult by  the dental service, Dr. Kristin Bruins.  He underwent extensive dental  extraction on May 15, 2009.  On admission, his Coumadin was held and he  was put on heparin.  After his dental extraction, he did have some  ecchymosis around the face and jaw, but this is gradually resolving.  Dr. Tyrone Sage has noted his investigating options for possible referral  for percutaneous aortic valve repair or replacement.  The patient did  undergo a TEE on May 11, 2009.  EF by TEE was 25-30% with an aortic  valve area 0.79 and a peak gradient of 67 mm.  The patient was  transferred to the telemetry floor and his Coumadin was resumed after  his dental extraction.  We have gradually resumed some of his home  medications, but with lower doses.  We have left him off his amiodarone.   We feel he could be discharged May 21, 2009.  At discharge, his BNP is  599, protime 2.1, and creatinine 1.0.  His weight is 79.6 kg.   DISCHARGE MEDICATIONS:  1. Folic acid 1 mg twice a day.  2. Protonix 40 mg a day.  3. Vitamin B12 daily.  4. Iron 325 mg a day.  5. Ativan 0.5 mg b.i.d.  6. Magnesium oxide 400 b.i.d.  7. Thiamine 100 mg a day.  8. Coreg 3.125 b.i.d.  9. Lasix 40 mg a day.  10.Two baby aspirin a day.  11.Lanoxin 0.125 mg a day.  12.Vasotec 2.5 mg b.i.d.  13.Potassium 20 mEq a day.  14.Coumadin 7.5 mg a day.   LABORATORY DATA:  At discharge, white count 6.0, hemoglobin 9.8,  hematocrit 29.2, platelets 276, INR 2.1.  Sodium 134, potassium 4.2, BUN  17, creatinine 1.0.  BNP is 599.  His last LFTs show a AST of 31, ALT of  58 on the 30th.   DIAGNOSTIC STUDIES:  Ultrasound of the abdomen done this admission  showed no acute findings.  Telemetry shows a sinus rhythm with pacing on  demand.   DISPOSITION:  The patient is discharged in stable condition.  He will  follow up with Dr. Alanda Amass later this week.      Abelino Derrick, P.A.      Richard A. Alanda Amass, M.D.  Electronically Signed    LKK/MEDQ  D:  05/21/2009  T:  05/21/2009  Job:  [id number]   cc:   Sheliah Plane, MD  Richard A. Alanda Amass, M.D.

## 2011-05-06 NOTE — Discharge Summary (Signed)
[name], [name] NO.:  [id number]   MEDICAL RECORD NO.:  [id number]          PATIENT TYPE:  INP   LOCATION:  2028                         FACILITY:  [facility]   PHYSICIAN:  [name], M.D.DATE OF BIRTH:  12-31-1940   DATE OF ADMISSION:  06/23/2009  DATE OF DISCHARGE:  07/01/2009                               DISCHARGE SUMMARY   DISCHARGE DIAGNOSES:  1. Congestive heart failure, acute-on-chronic systolic heart failure,      improved at discharge.  2. Recurrent paroxysmal atrial fibrillation, the patient has put back      on amiodarone and is in sinus rhythm at discharge, he does have a      history of LFT elevations with amiodarone in the past but seems to      be tolerating it for the moment.  3. Severe aortic stenosis, plans are for the patient to go to [facility]      later this month for aortic valve replacement.  4. Ischemic cardiomyopathy with an ejection fraction of 25% by      catheterization in May 2010.  5. Coronary artery bypass grafting twice, once in 1993 and once in      2000, his last catheterization was in May 2010.  6. History of biventricular implantable cardioverter-defibrillator      with diaphragmatic stimulation secondary to left ventricular lead.  7. Chronic hypotension, which limits medical therapy.  8. Coumadin therapy with therapeutic INR at discharge.   HOSPITAL COURSE:  The patient is a 70 year old male, well known to our  group and [name] with a history of bypass surgery twice and  multiple interventions.  His last catheterization was in May 2010.  At  that time, he had an 80-85% PLA stenosis and an aortic valve area of  0.86 sq. cm with an EF of 25%.  He also has moderate pulmonary  hypertension.  He has had problems with recurrent atrial fibrillation.  He was initially put on amiodarone but had problems with LFT elevation.  He was put on Multaq, but because of heart failure, this had to be  discontinued.  He was  evaluated finally at [facility] who agreed to proceed  up with an attempted aortic valve replacement for severe AS, he was  turned down here at [facility].  [name] has outlined the plan as  follows:  The patient is to have a tissue AVR with a maze procedure and  placement of an epicardial LV lead for BiV pacing.  He is set up to see  [name] at [facility] later this month.  In the interim, the patient was  admitted on June 24, 2009, with recurrent shortness of breath,  hypotension, and recurrent atrial fibrillation.  He was admitted to  telemetry for further evaluation.  CK-MBs were negative.  BNP was 2429  on admission.  He was treated with IV diuretics.  Eventually, we decided  to put him back on amiodarone.  He has tolerated this and is now in  sinus rhythm.  He was kept until his BNP was 600.  We feel he can be  discharged  on July 01, 2009.  He will follow up with [name].   LABORATORY DATA:  At discharge, his BNP is 615.  Renal function shows a  sodium 136, potassium 3.8, BUN 20, creatinine 1.17.  INR 2.1.  TSH this  admission is 1.9.  His troponin did bump slightly to 0.18, but CK-MBs  have been negative.  Hemoglobin A1c is within normal limits at 5.4.  He  was anemic this admission with a hemoglobin of 8.7 and he was  transfused.  His last hemoglobin was on June 29, 2009, and was 11.7.  Chest x-ray shows cardiac enlargement without heart failure.  Telemetry  shows sinus rhythm with atrial tracking ventricular pacing.   DISCHARGE MEDICATIONS:  1. Potassium 20 mEq a day.  2. Vitamin B 1000 mg a day.  3. B12 1000 mg a day.  4. Aldactone 12.5 mg a day.  5. Lanoxin 0.0625 mg a day.  6. Folic acid 1 mg b.i.d.  7. Lasix 60 mg a day.  8. Aspirin 81 mg a day.  9. Coreg 3.125 mg twice a day.  10.Iron 325 mg a day.  11.Enalapril 2.5 mg a day.  12.Amiodarone as directed.  13.He will be discharged on 7.5 mg a day of Coumadin and amiodarone      400 mg a day.   DISPOSITION:  The patient is  discharged in stable condition.  He will  keep his followup with [name] at [facility]  He knows to contact us in the  interim if he has any problems.  He will have a protime later this week  and our office will contact him with this.      [name], P.A.      [name], M.D.  Electronically Signed    LKK/MEDQ  D:  07/01/2009  T:  07/01/2009  Job:  [id number]   cc:   [facility] Dr. Bevelyn [name]

## 2011-05-06 NOTE — H&P (Signed)
NAMESHNEUR, WHITTENBURG NO.:  [id number]   MEDICAL RECORD NO.:  [id number]          PATIENT TYPE:  INP   LOCATION:  1829                         FACILITY:  [facility]   PHYSICIAN:  Ulyses Amor, MD DATE OF BIRTH:  06-Sep-1941   DATE OF ADMISSION:  08/12/2007  DATE OF DISCHARGE:                              HISTORY & PHYSICAL   [name] is a 70 year old white man who is admitted to [facility] for further evaluation of chest pain.   The patient has a history of coronary artery disease. He has previously  suffered a myocardial infarction. He underwent coronary artery bypass  surgery in 1993 and again in 2000. He has also undergone severe  percutaneous coronary interventions. His last cardiac catheterization  was performed in December 2006 and demonstrated that no further  intervention was necessary. His grafts were patent. One of his TDA  stents had a 50% InStent stenosis; the other one was patent. Continued  medical therapy was recommended. He also has an ischemic cardiomyopathy  with an ejection fraction of approximately 20%. In addition, he has  undergone ablation for atrial flutter.   The patient presented to the emergency department with a history of  intermittent chest pain since last night. The chest pain is described as  a sharp, catching discomfort in a very focal region in the left  supraclavicular area. It does not radiate. It is not associated with  dyspnea, diaphoresis or nausea. It is exacerbated by movement of his  torso. Episodes last several seconds to several minutes. They then  resolve, and recur again several minutes later. If he lies still he does  not experience any chest pain. He notes that this chest pain is totally  different in quality from the chest pain which heralded his acute  myocardial infarctions. He is free of chest pain at this time.   The patient is status post left carotid endarterectomy. He is also known  to  have renovascular disease. Other medical problems include  hypertension, dyslipidemia and gout.   The patient is on a number of medications. These include Coumadin,  aspirin, folic acid, potassium, digoxin, Crestor, atenolol, allopurinol,  furosemide, Protonix, enalapril, and Plavix.   ALLERGIES:  LIPITOR.   OPERATIONS:  Coronary artery bypass operation on 2 occasions, left  carotid endarterectomy.   SOCIAL HISTORY:  The patient is a retired Visual merchandiser. He lives with his  wife. He does not smoke cigarettes. He does not drink alcohol.   FAMILY HISTORY:  Notable for coronary artery disease.   REVIEW OF SYSTEMS:  Reveals no new problems related to his head, eyes,  ears, nose, mouth, throat, lungs, gastrointestinal system, genitourinary  system, or extremities. There is no history of neurologic or psychiatric  disorder. There is no history of fever, chills, or weight loss.   PHYSICAL EXAMINATION:  VITAL SIGNS:  Blood pressure 103/58, pulse 60 and  regular. Respirations 20. Temperature 99.3. Pulse ox 97% on room air.  GENERAL:  This patient was an older white man in no discomfort. He was  alert, oriented, appropriate and responsive.  HEENT:  Head,  eyes, nose and mouth were normal.  NECK:  Without thyromegaly or adenopathy. Carotid pulses were palpable  bilaterally and without bruits.  CARDIAC:  Revealed a normal S1 and S2. There was no S3, S4, murmur, rub  or click.  CARDIAC:  Rhythm is regular. No chest wall tenderness was noted.  LUNGS:  Clear.  ABDOMEN:  Soft and nontender. There was no mass, hepatosplenomegaly,  bruit, distention, rebound, guarding or rigidity. Bowel sounds were  normal.  RECTAL/GENITAL:  Not performed as they were not pertinent to the reasons  for acute care hospitalization.  EXTREMITIES:  Without edema, deviation, or deformity. Radial and  dorsalis pedis pulses were palpable bilaterally.  Brief screening neurologic survey was unremarkable.   Electrocardiogram   revealed normal sinus rhythm with first degree AV  block, frequent and occasionally consecutive PVCs, left axis deviation,  and left ventricular hypertrophy with QRS widening and repolarization  abnormality. There were no repolarization abnormalities specific for  ischemic or infarction. The chest radiograph, according to the  radiologist, demonstrated cardiomegaly with vascular congestion, low  volume, and bibasilar atelectasis. The initial set of cardiac markers  revealed a myoglobin of 68.6, CK-MB 1.1, and troponin less 0.05.  A  second set of cardiac markers revealed a myoglobin of 64.7, CK-MB 1.3,  and troponin less than 0.05. Potassium was 3.8, BUN 18, and creatinine  1.5. The remaining studies were pending at the time of this dictation.   IMPRESSION:  1. Chest pain; rule out unstable angina. The chest pain is atypical      for cardiac ischemia.  2. Coronary artery disease. Status post myocardial infarction. Status      post coronary artery bypass surgery in 1993 and 2000. Status post      multiple percutaneous interventions. The last cardiac      catheterization was in December 2006 and demonstrated that no      intervention was needed.  3. Ischemic cardiomyopathy. Ejection fraction 30%.  4. Status post ablation for atrial fibrillation.  5. Status post left carotid endarterectomy.  6. Hypertension.  7. Dyslipidemia.  8. Gout.   PLAN:  1. Telemetry.  2. Serial cardiac enzymes.  3. Intravenous nitroglycerin.  4. No heparin or Lovenox pending a therapeutic INR.  5. Further measures per Dr. Alanda Amass.      Ulyses Amor, MD  Electronically Signed     MSC/MEDQ  D:  08/12/2007  T:  08/12/2007  Job:  [id number]   cc:   Gerlene Burdock A. Alanda Amass, M.D.

## 2011-05-06 NOTE — Discharge Summary (Signed)
[name], [name]             ACCOUNT NO.:  [id number]   MEDICAL RECORD NO.:  [id number]          PATIENT TYPE:  INP   LOCATION:  3737                         FACILITY:  [facility]   PHYSICIAN:  [name], M.D.DATE OF BIRTH:  [birth year]   DATE OF ADMISSION:  10/21/2008  DATE OF DISCHARGE:  10/24/2008                               DISCHARGE SUMMARY   DISCHARGE DIAGNOSES:  1. Recurrent atrial flutter.  2. Acute on chronic systolic heart failure.  3. Known ischemic cardiomyopathy with improvement in his ejection      fraction to 35% by echocardiogram in 2009.  4. Alcohol abuse.  5. Aortic stenosis.  6. Dyslipidemia.  7. History of gout.  8. Recent diarrhea, improved off colchicine.  9. Peripheral vascular disease with prior left carotid endarterectomy.  10.History of amiodarone failure because of breakthrough atrial      fibrillation and elevated LFTs.  11.Treated hypertension.  12.Coumadin therapy.   HOSPITAL COURSE:  [name] is a 70 year old male, well known to Dr.  Alanda [name].  He has coronary disease with prior bypass surgery in 1993  with redo bypass in October 2000.  His last catheterization was in  August 2008, all grafts were patent, and his EF was 25%.  He underwent  BiV ICD with improvement in his ejection fraction to an EF of 35-45% by  outpatient echo.  He recently saw Dr. Alanda [name] in the office.  He had  been having problems with diarrhea.  He has also had elevated LFTs and  breakthrough AFib on amiodarone.  His amiodarone was cut back, and he  was put on dronedarone.  We stopped his Crestor and colchicine and cut  back his Lanoxin.  He was to see Dr. Ewing [name] as an outpatient.  He was  seen in the office on October 10, 2008, and then admitted on October 21, 2008, with increasing shortness of breath and an elevated BNP of 1700.  The patient was admitted to telemetry.  Pharmacy did not want to give  the dronedarone because of his heart failure.  He was  put on heparin as  his INR was subtherapeutic.  Initially, he was in sinus rhythm, but he  had breakthrough atrial flutter.  He was seen in consult by the EP  service, Dr. Johney [name].  The plan is to resume his dronedarone.  He will  need a therapeutic INR for 3-4 weeks and then Dr. Johney [name] will reevaluate  him for left-sided atrial flutter ablation.  The patient is discharged  in stable condition on October 24, 2008.  We did increase his Coumadin,  and he will have a pro time on Friday.   His hospital labs, white count 6.7, hemoglobin 12, hematocrit 36, and  platelets 250.  INR is 1.8.  Sodium 140, potassium 3.7, BUN 19,  creatinine 1.18.  Liver functions show a SGOT of 71 and SGPT of 47.  BNP  at discharge is 335; BNP on admission was 1700.  TSH is 1.64.  Troponins  were negative.  His admission weight was 95 kg, at discharge his weight  is 90 kg.  DISCHARGE MEDICATIONS:  1. Dronedarone 400 mg b.i.d.  2. Diltiazem 180 mg a day.  3. Coumadin 5 mg a day, with 7.5 on Tuesday, Thursday, and Saturday.  4. Aspirin 81 mg a day.  5. Folic acid 2 mg a day.  6. Thiamine 100 mg a day.  7. Potassium 20 mEq a day.  8. Lasix 60 mg a day.  9. Metoprolol XL 75 mg once a day.  10.Prilosec 20 mg once a day.  11.Lanoxin 0.625 mg a day.  12.Enalapril 10 mg a day.  13.Ativan p.r.n.  14.Nitroglycerin sublingual p.r.n.   DISPOSITION:  The patient is discharged in stable condition and will  have a pro time on Friday morning.  He will need 3-4 weeks of  therapeutic INR before his flutter ablation.      [name], P.A.      [name], M.D.  Electronically Signed    LKK/MEDQ  D:  10/24/2008  T:  10/24/2008  Job:  [id number]   cc:   Hillis Range, MD  [facility]

## 2011-05-06 NOTE — Consult Note (Signed)
NAMEJAMILE, SIVILS NO.:  [id number]   MEDICAL RECORD NO.:  [id number]          PATIENT TYPE:  INP   LOCATION:  4711                         FACILITY:  [facility]   PHYSICIAN:  Shirley Friar, MDDATE OF BIRTH:  1941/10/17   DATE OF CONSULTATION:  04/19/2009  DATE OF DISCHARGE:                                 CONSULTATION   We were asked to see Mr. Larch today in consultation for anemia by  Dr. Alanda Amass of the Cardiology Service.   HISTORY OF PRESENT ILLNESS:  This is a pleasant 70 year old male with a  60-plus-year history of chewing tobacco.  He has a recurrent drop in  hemoglobin, had a blood transfusion last time he was in the hospital in  September 2009.  He is complaining of shortness of breath.  He says he  is quick to fatigue.  He also has a burning pain in his epigastrium as  well as nausea without vomiting.  He is currently on an 81-mg aspirin  and Coumadin.  He is also on Protonix b.i.d.  He tells me that his  epigastric pain is worse when he eats salads or cucumbers.  Also worse  when he eats greasy foods like barbecue or cheeseburger.  The pain is  helped with bowel movements.  The patient reports that he has a bowel  movement every 1-3 days, but all of his bowel movements for the past  month have been diarrhea.  They are dark in color.  He feels this is  secondary to the iron that he takes.  The patient is in with congestive  heart failure exacerbation.  His symptoms are worsened by his drop in  hemoglobin.  His last colonoscopy was done by Dr. Vida Rigger in February  2007.  He had small internal and external hemorrhoids.  He had an  hypertrophied anal papilla; otherwise, he was normal to the terminal  ileum.  He is not due for another colonoscopy for 10 years.  His last  upper endoscopy was on November 15, 2008, done by Dr. Wandalee Ferdinand.  It  showed streaks of erythema in his antrum; otherwise, it was normal upper  endoscopy.   Past medical  history is significant for coronary artery disease.  He is  status post 2 coronary artery bypass graftings.  He has cerebrovascular  disease as well as peripheral vascular disease with a history of carotid  endarterectomy.  He has hypertension, hyperlipidemia.  He is status post  MI.  He has atrial fibrillation and atrial flutter as well as sick sinus  syndrome.  He is status post pacemaker with ICD placement in 2008.  He  also has a history of chronic alcohol abuse but has not had alcohol  recently.  He does have a 60 plus year of chewing tobacco use.   ALLERGIES:  He has an allergy to LIPITOR and that it causes joint pain.  He also has a sensitivity to AMIODARONE and that it causes increased  LFTs.   CURRENT MEDICATIONS:  Warfarin, 81 mg aspirin, folic acid, potassium,  digoxin, Lasix, Protonix, enalapril, colchicine, B1, B12,  spironolactone, Crestor, iron, amiodarone, lorazepam, and nitroglycerin.   Review of systems is negative for weight loss, anorexia, or  palpitations.  He does complain of right shoulder pain, somewhat  associated with his epigastric pain over the past week or so.   SOCIAL HISTORY:  He does chew tobacco.  Has not had recent alcohol.  Denies any recreational drug use.  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   Family history is negative for colon cancer and colon polyps but  positive for gallbladder disease in both of his children.   On physical exam, he is alert and oriented, in no apparent distress.  His temperature is 97.5, pulse is 72, respirations 22, blood pressure is  92/62.  Heart has a regular rhythm currently.  Lungs demonstrate no  wheezes or crackles.  Abdomen is soft, nontender, mildly distended with  good bowel sounds.   Current labs show a hemoglobin of 8.9 with an MCV value of 85.1,  hematocrit 26.5, white count 5.4, platelets 156,000.  His INR today is  2.7, it was 3.8 on admission.  His BUN is 18, creatinine 1.34.  His BNP   today is 2550.  He was found to be guaiac negative this admission.  Baseline hemoglobin appears to be between 13 and 14.  Chest x-ray done  April 17, 2009, shows cardiac enlargement.  Chest x-ray done April 18, 2009, shows worsening of aeration suspicious for mild congestion.  In  November 2009, he had an abdominal ultrasound that was negative for  gallstones or acute disease.   ASSESSMENT:  Dr. Charlott Rakes has seen and examined the patient,  collected history, and reviewed his chart.  His impression is that this  is a 70 year old gentleman with normocytic anemia.  He does have upper  tract GI symptoms.  He is on Coumadin, aspirin, and Protonix.  We will  plan to evaluate his symptoms and anemia in the morning with an upper  endoscopy.  We will also need a capsule endoscopy soon.  We will be able  to do this is as an outpatient after the patient has been off iron for 5  days.  Also, you may consider a hepatobiliary scan if his upper  endoscopy is negative to evaluate some of his upper tract symptoms.   Addendum: Due to his defibrillator, a capsule endoscopy is  contraindicated. We will evaluate his status as an outpatient to decide  on whether additional GI workup for anemia is necessary if his EGD is  unremarkable.   Thanks very much for this consultation.      Stephani Police, [place]      Shirley Friar, MD  Electronically Signed    MLY/MEDQ  D:  04/19/2009  T:  04/20/2009  Job:  [id number]   cc:   Petra Kuba, M.D.  Richard A. Alanda Amass, M.D.

## 2011-05-06 NOTE — Discharge Summary (Signed)
[name], [name]             ACCOUNT NO.:  [id number]   MEDICAL RECORD NO.:  [id number]          PATIENT TYPE:  INP   LOCATION:  4711                         FACILITY:  [facility]   PHYSICIAN:  Richard A. Alanda Amass, M.D.DATE OF BIRTH:  1941-09-21   DATE OF ADMISSION:  04/17/2009  DATE OF DISCHARGE:  04/23/2009                               DISCHARGE SUMMARY   DISCHARGE DIAGNOSES:  1. Acute on chronic systolic congestive heart failure.  2. Ischemic cardiomyopathy with a prior ejection fraction of 35-45%.  3. Valvular heart disease with mild-to-moderate aortic stenosis.  Mean      grade on echo was 23 performed in December 2009.  By      catheterization aortic valve area of 0.88 cm2 in 2008.  4. Paroxysmal atrial flutter.  Previous history of cavotricuspid      isthmus ablation, now being seen by Dr. Johney Frame for possible flutter      ablation when his INR becomes therapeutic which has been a      difficult undergoing.  5. Coronary artery disease with history of coronary bypass grafting in      1992 with a redo in 2000, multiple catheterizations since then      including December 2005, December 2006, February 2007, and August      2008 with the last catheterization showing all grafts were patent.      Stent in his distal right coronary artery and posterior descending      artery from 2005 was patent.  6. Peripheral vascular disease with history of left carotid      endarterectomy in October 2003.  7. Prior history of gastrointestinal bleed, February 13, 2009.  8. History of elevated LFTs, probably secondary to alcohol abuse and      also on amiodarone at the time.  He is back on amiodarone now.  He      states that he has had 9 months of abstinence and his LFTs have not      elevated.  9. History of biventricular implantable cardioverter defibrillator of      [employer], interrogated this admission.  He has had no shocks.  He      has had multiple occurrences of paroxysmal atrial  flutter and mode      switching.  10.History of gout.  11.Hypertension.  12.History of gastroesophageal reflux disease.  13.History of hypothyroidism.  14.Nonsustained ventricular tachycardia.  15.Minimal gastritis by esophagogastroduodenoscopy this admission      performed by Dr. Bosie Clos on April 20, 2009.  16.Chronic renal insufficiency.  17.Anemia, pathology unknown status post esophagogastroduodenoscopy      this admission showing minimal gastritis to have further workup as      an outpatient.  His anemia problems are recurrent.  18.Anticoagulation, on Coumadin for his paroxysmal atrial flutter.   LABORATORY DATA:  INR on Apr 23, 2009 was 3.2.  BNP on Apr 22, 2009 was  1439.  He had a high BNP of 2550 on April 19, 2009.  Magnesium 2.2,  ferritin was 66, total iron was 54, and TIBC was 407.  Total bilirubin  was 1.2.  AST was 20 and ALT was 20.  Total protein 7.3 and albumin was  3.4.  Retic count was 2.8 on Apr 22, 2009.  Sodium 135, potassium 4.1,  BUN 27, and creatinine 1.51.   X-rays on April 18, 2009 revealed cardiomegaly, slight worsening in  aeration suspicious for mild congestion edema, stable elevation of right  hemidiaphragm.   CONSULTATIONS:  Cardiac consult by Dr. Bosie Clos on April 19, 2009 of GI.   DISCHARGE MEDICATIONS:  1. Warfarin 5 mg a day.  2. Aspirin 81 mg twice a day.  3. Folic acid 1 mg twice a day.  4. Klor-Con 20 mEq b.i.d.  5. Digoxin 0.125 mg every day.  6. Furosemide 40 mg b.i.d.  7. Protonix 40 mg b.i.d.  8. Enalapril 2.5 mg a day.  9. Colchicine 0.6 mg twice a day.  10.Vitamin B1 100 mg daily.  11.Vitamin B12 1000 mg daily.  12.Spirolactone 25 mg a day.  13.Crestor 5 mg a day.  14.Ferrous sulfate 325 mg a day.  15.Amiodarone 200 mg twice a day.  16.Lorazepam 0.5 mg twice a day.  17.Nitroglycerin 0.4 mg p.r.n.  18.Carvedilol 3.125 mg twice a day.   OTHER INSTRUCTIONS:  He should have his Protime checked on Apr 26, 2009  at 11:50.  He will  have his labs drawn on May 07, 2009 and he will  follow up with Dr. Alanda Amass on May 10, 2009 at 11:15.  He has an  appointment to see Dr. Ewing Schlein on Apr 26, 2009 at 12:30.   HOSPITAL COURSE:  Mr. Chaloux is a 70 year old white married male who  came to the emergency room secondary to increased shortness of breath.  He was in class IV CHF, acute on chronic systolic.  He was seen by Dr.  Sheliah Mends.  Per Dr. Sheliah Mends who admitted him for diuresis.  He  did have some dietary indiscretion recently with going out to eat  several times a week previously including going to [employer] and  also drinking too much fluids.  His BNP was 1944.  His hemoglobin was  10.1 and hematocrit 29.6.  His fecal occult was negative.  His blood  count did drop to 8.9 and a GI consult was called.  He underwent EGD  while he was here which only showed minimal gastritis.  Dr. Bosie Clos  wanted him off iron for 5 days and then he would have an outpatient  capsule endoscopy.  He has had a history of recurrent anemia.  His BNP  peaked in the hospital at 2550.  We gave him IV diuretics.  We attempted  to put him on IV dobutamine, however, he had been in sinus rhythm.  After he received IV dobutamine, he went into atrial flutter.  His  amiodarone was increased during his hospitalization.  His symptoms  became better, even though his BNP is still elevated.  His heart failure  is down to at least a class III if not II now.  He is able to walk in  the hall without any oxygen.  His O2 sats were 93% and stable.  He did  pace himself while walking with cardiac rehab.  His heart rate was in  the 70s and went up to 89 after walking.  That was on  Apr 21, 2009.  He was considered to be stable for discharge home.  He was  seen by Dr. Elsie Lincoln on Apr 23, 2009.  Hopefully in the future when he is  out of heart failure and his GI workup has been done, we may be able to  proceed with ablation of his AV flutter.  This will  probably help his  heart failure.      Lezlie Octave, N.P.      Richard A. Alanda Amass, M.D.  Electronically Signed    BB/MEDQ  D:  04/23/2009  T:  04/24/2009  Job:  [id number]   cc:   Petra Kuba, M.D.  Hillis Range, MD

## 2011-05-06 NOTE — Op Note (Signed)
[name], [name] NO.:  [id number]   MEDICAL RECORD NO.:  [id number]          PATIENT TYPE:  INP   LOCATION:  4731                         FACILITY:  [facility]   PHYSICIAN:  Graylin Shiver, M.D.   DATE OF BIRTH:  1941-07-17   DATE OF PROCEDURE:  11/15/2008  DATE OF DISCHARGE:  11/15/2008                               OPERATIVE REPORT   INDICATION:  Intermittent melena, heme-positive stool.   The patient is on Coumadin.   Informed consent was obtained after explanation of the risks of  bleeding, infection, and perforation.   PREMEDICATION:  1. Fentanyl 50 mcg IV.  2. Versed 5 mg IV.   PROCEDURE:  With the patient in the left lateral decubitus position, the  Pentax gastroscope was inserted into the oropharynx and passed into the  esophagus.  It was advanced down the esophagus, then into the stomach,  and into the duodenum.  The second portion and bulb of the duodenum were  normal.  The stomach looked essentially normal with just a few  erythematous streaks noted in the antrum, otherwise, it looked normal.  There was no evidence of bleeding.  No evidence of erosions or ulcers.  The fundus and cardia looked normal.  The esophagus looked normal.  He  tolerated the procedure well without complications.   IMPRESSION:  Essentially normal upper endoscopy with just some mild  erythematous streaks in the antrum, otherwise normal.   The patient can follow up in the office with Dr. Ewing Schlein to discuss  colonoscopy.           ______________________________  Graylin Shiver, M.D.     SFG/MEDQ  D:  11/15/2008  T:  11/15/2008  Job:  [id number]   cc:   Gerlene Burdock A. Alanda Amass, M.D.  Petra Kuba, M.D.

## 2011-05-06 NOTE — Letter (Signed)
December 12, 2008    Richard A. Alanda Amass, MD  [address]  [place], [place] [address]   RE:  Donald, [name]  MRN:  [id number]  /  DOB:  1941/12/04   Dear Dr. Alanda Amass:   It was my pleasure to see your patient, [name], in  Electrophysiology followup today regarding his atrial fibrillation.  As  you recall, he is a very pleasant gentleman with an ischemic  cardiomyopathy, history of heavy alcohol consumption, paroxysmal atrial  fibrillation, and atypical atrial flutter status post prior  biventricular ICD for New York Heart Association class III heart failure  symptoms.  He was previously evaluated by me during a recent  hospitalization for atypical atrial flutter with decompensated heart  failure.  Our plan was for atrial fibrillation ablation.  Since that  time, the patient has been initiated on Multaq.  He has tolerated this  medication quite well.  He reports significant improvement in symptoms  of palpitations and heart failure.  Presently, he notes that his  exercise capacity has improved.  He is now able to walk much farther  than his baseline, though he is unable to quantitate this for me today  in clinic.  He also reports significant improvement in lower extremity  edema and shortness of breath.  He denies chest discomfort, presyncope,  or syncope since last being seen in our clinic.  He has also been  treated with Coumadin therapy.  Unfortunately, over the past few weeks,  his INRs have been subtherapeutic.  His last therapeutic INR was on  November 18.   PROBLEM LIST:  1. Ischemic cardiomyopathy (ejection fraction at 35%).  2. Paroxysmal atrial fibrillation and atypical atrial flutter.  3. Status post biventricular implantable cardioverter-defibrillator.  4. Chronic New York Heart Association class III heart failure      symptoms.  5. AMIODARONE intolerance due to elevated liver function tests.  6. Coronary artery disease status post coronary  artery bypass graft.  7. History of alcohol abuse.  8. Peripheral vascular disease with prior left carotid endarterectomy.  9. Mild aortic stenosis.  10.Hypertension.  11.Hyperlipidemia.  12.Status post cava-tricuspid isthmus ablation by Dr. Severiano Gilbert on      January 23, 2005.  13.Mild chronic renal insufficiency.   CURRENT MEDICATIONS:  1. Multaq 400 mg twice daily.  2. Metoprolol 75 mg b.i.d.  3. Diltiazem 180 mg daily.  4. Coumadin.  5. Digoxin 0.0625 mg daily.  6. Enalapril 5 mg daily.  7. Vitamin B12 daily.  8. Potassium chloride 20 mEq daily.  9. Folate 2 g daily.  10.Lasix 60 mg daily.  11.Aspirin 81 mg daily.  12.Lorazepam 0.2 mg b.i.d.  13.Zegerid 2 tablets daily.   PHYSICAL EXAMINATION:  VITAL SIGNS:  Blood pressure 130/80, heart rate  76, respirations 18, and weight 211 pounds.  GENERAL:  The patient is a well-appearing male in no acute distress.  He  is alert and oriented x3.  HEENT:  Normocephalic and atraumatic.  Sclerae are clear.  Conjunctivae  are pink.  Oropharynx is clear.  NECK:  Supple.  JVP 8 cm.  LUNGS:  Clear to auscultation bilaterally.  HEART:  Regular rate and rhythm, 3/6 systolic ejection murmur along the  left sternal border.  GI:  Soft, nontender, and nondistended.  Positive bowel sounds.  EXTREMITIES:  No clubbing, cyanosis, or edema.  NEUROLOGIC:  Strength and sensation are intact.  SKIN:  No ecchymosis or lacerations.  The patient has diffuse  excoriations upon his  upper extremities bilaterally.   EKG reveals sinus rhythm at 60 beats per minute with biventricular  pacing.   IMPRESSION:  [name] is a very pleasant 70 year old gentleman with  an ischemic cardiomyopathy (ejection fraction at 35%), coronary artery  disease status post coronary artery bypass graft x2, New York Heart  Association class III heart failure symptoms chronically and paroxysmal  atrial fibrillation/atypical atrial flutter, who presents today for  followup in  anticipation of electrophysiology study and radiofrequency  ablation in the near future.  He has had significant improvement in  heart failure symptoms.  He appears to have improvement in symptoms with  Multaq for rhythm control.  Unfortunately, he has had difficulties  maintaining an therapeutic INR recently.  This maybe a secondary to  discontinuing amiodarone approximately a month ago.  He has not had a  therapeutic INR since November 13.   As the patient has not had a therapeutic INR with the past month, I  think it would be prudent for [place] to establish that he can maintain an  INR between 2 and 3 before proceeding to EP study and radiofrequency  ablation for atrial fibrillation.  As you know when risk of the  procedure is stroke.  It will be essential that he is able to maintain  an INR between 2 and 3 from following the procedure.  I, therefore,  recommend that we postpone his atrial fibrillation ablation for several  weeks until we are able to establish a therapeutic INR.   PLAN:  1. No medication changes were made today.  2. The patient will follow up closely in the Coumadin Clinic with a      goal INR of 2-3.  3. Once he has maintained the therapeutic INR for 4 weeks, we will      then proceed with EP study and radiofrequency ablation for atrial      fibrillation and atypical atrial flutter at the same time.   Thank you again for the opportunity of participating in the care of Mr.  [name].  Please feel free to contact me if you wish to discuss his  care further.    Sincerely,      Hillis Range, MD  Electronically Signed    JA/MedQ  DD: 12/12/2008  DT: 12/13/2008  Job #: [id number]

## 2011-05-06 NOTE — Cardiovascular Report (Signed)
NAMEFERGUSON, GERTNER             ACCOUNT NO.:  [id number]   MEDICAL RECORD NO.:  [id number]          PATIENT TYPE:  OBV   LOCATION:  3731                         FACILITY:  [facility]   PHYSICIAN:  Richard A. Alanda Amass, M.D.DATE OF BIRTH:  1941-10-20   DATE OF PROCEDURE:  08/13/2007  DATE OF DISCHARGE:  08/14/2007                            CARDIAC CATHETERIZATION   PROCEDURE:  Retrograde central aortic catheterization, retrograde left  heart catheterization, right heart catheterization, selective coronary  angiography via Judkins technique, saphenous vein graft angiography,  selective right internal mammary artery, left internal mammary artery  injections, left ventricular angiogram in the RAO and LAO projection,  abdominal aortic angiogram midstream PA projection, thermal dilution  cardiac output, simultaneous AV-O2 difference.   PROCEDURE:  The patient was brought to the second floor CP lab in a  postabsorptive state after 5 mg Valium p.o. premedication.  He was given  3 mg Versed IV for sedation in the laboratory.  Informed consent was  obtained from the patient's wife to proceed with the diagnostic  catheterization.  The right groin is prepped, draped in usual manner; 1%  Xylocaine was used for local anesthesia.  The RCA was entered with  single anterior puncture using 18 thin-wall needle, and a 6-French short  sidearm sheath was inserted without difficulty.  The right common  femoral vein was entered with a single anterior puncture, and a 7-French  short sidearm sheath was inserted without difficulty.  Selective guide  wire exchange was used throughout the procedure.  Heparin was on hold on  coming to the catheterization lab.  Selective coronary angiography was  done with 6-French 4 cm taper Cordis preformed coronary catheters in  multiple orthogonal projections using hand injections.  Saphenous vein  graft angiography was done using an A1 6-French catheter for the SVG to  RCA  and a left coronary bypass catheter for the SVG to diagonal and the  combined SVG and RIMA to the OM branches of the circumflex.  A 6-French  LIMA catheter was used for selective LIMA angiography and subselective  subclavian injection.  LV angiogram was done in the RAO and LAO  projection; 25 mL, 14 mL per second and 20 mL, 12 mL per second,  respectively..  LV pressures were recorded, and pullback pressures  recorded.  Rapid pullback to the CA was performed under constant  monitoring.  Right heart catheterization was done with triple-lumen flow-  directed Swan-Ganz catheter.  PCW pressures followed by pullback  pressure, PA then simultaneous AV-O2 difference and thermodilution  cardiac outputs were obtained.  Right heart pullback pressure obtained,  and the Swan-Ganz catheter was removed.   Abdominal aortic angiogram was done with a pigtail catheter above the  level of the renal artery at 25 mL, 20 mL per second with follow-through  of superficial femoral artery-profunda junction bilaterally.  Catheters  removed.  Side-arm sheaths were flushed.  The patient brought to the  holding area after sheath removal and pressure hemostasis.  He tolerated  the procedure well.   PRESSURES:  RA:  12/10; mean 12 mmHg.  RV:  23/2; RVEDP 7 mmHg.  PA:  43/15; mean 28 mmHg.  PCW:  18/21; mean 15 mmHg.  LV:  138/zero; LVEDP 22 mmHg.  CA:  118/65; mean 70 mmHg.   [place] systemic [place] 15361 DYN DES  to the minus fifth.   Peripheral vascular resistances is equal to 73 DYNES to the minus fifth.   PA saturation 63%; CA saturation 97%.   BSA 2.25; heart rate 61 MSR; hemoglobin 14.4 grams percent.   Thermal dilution-CO-CA equals 5.9/6/2.62 L/MIN/M squared.   Estimated Fick CO/CI equal 38/1.9 L/M/M squared.   There was a mean aortic valvular gradient of 21 mmHg across the aortic  valve on catheter pullback during sinus rhythm.   There was a peak aortic valve gradient of 26 mmHg on  catheter pullback.   Estimated aortic valve area equals 88 cm2.   Abdominal aortic angiogram in midstream PA projection revealed a single  normal-appearing right renal artery.  The left renal artery had about  80% and on some views up to 85% proximal segmental stenosis.  The  abdominal aorta appeared normal.  The SMA and celiac were intact, and  the iliacs were tortuous with no significant stenosis. Hypogastrics  intact.  The superficial femoral artery-profunda junction intact  bilaterally.   LV angiogram in the RAO projection showed hypokinesis-akinesis of the  basilar inferior wall.  There was global hypokinesis throughout with no  significant mitral regurgitation.  There was hypokinesis-akinesis of  posterior apical segment in LAO projection.  Estimated EF was  approximately 25% in the RAO and 35% in the LAO projection.   Fluoroscopy demonstrated 2+ calcification of the coronary arteries.   There was mild aortic valve calcification, and valve opening was  visualized.  The valve was crossed with a straight wire and a  multipurpose A1 catheter, and guidewire exchange was used for the  pigtail catheter in the LV.   NATIVE CORONARY ANGIOGRAMS:  The main left coronary artery was short,  and the radial was calcific but no significant stenosis.   The left anterior descending was totally occluded at its proximal  portion with no antegrade flow.   The circumflex artery was faintly visualized with an AV groove branch  with an 80% smooth lesion, very small distal vessel beyond this, and no  significant marginals visible.   The right coronary artery was totally occluded in its proximal portion  with no antegrade flow.   Saphenous vein graft to the RCA showed an excellent anastomosis to the  PDA with retrograde filling of the posterolateral branch that had no  significant stenosis.  The branches were moderate size and PDA areas of  prior stenting in 2005 showed no restenosis (less than  30%).  There was  smooth 40-50% narrowing of the distal third of the graft fairly focally  but excellent flow.   Saphenous vein graft to the diagonal was widely patent with no  significant stenosis and excellent anastomosis bifurcated. It was of  moderate size with good filling.   There was a combined vein graft from the aorta.  The saphenous vein  portion anastomosed to distal marginal was widely patent and smooth with  an excellent anastomosis to a large distal marginal branch.  The RIMA  came off the proximal portion of the vein graft, was widely patent and  smooth and had excellent anastomosis to the large proximal OM branch  which trifurcated distally with good flow and some retrograde filling  with an 80% lesion retrograde that was smooth.   The  LIMA was tortuous, widely patent, with an excellent anastomosis to  the LAD and retrograde filling of the second diagonal branch.  The LAD  filled down to the apex where it bifurcated.   The cardiac catheterization demonstrates widely patent grafts with  patent SVG to diagonal, patent SVG to distal OM, patent SVG to RCA,  patent RIMA to the large OM, and patent LIMA to the LAD.  There is  significant LV dysfunction with global hypokinesis, inferior and  posterior apical wall motion abnormality with estimated EF 25-45% and no  significant MR.   There is calcific valvular aortic stenosis of a moderate degree as  outlined above with valve opening seen and mild calcification.  The  degree of AI was not measured.   DISCUSSION:  Mr. Kinzler is well known to me long term. He has  significant cardiovascular disease.  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  He has never been a smoker.  He  is disabled, [place], and his wife works full time and is  also patient of ours.  Canden has had multiple prior PCI's prior to his  initial  CABG in 1992.  He was essentially lost to followup and  sporadically taking medication, history of increased EtOH, and then  reappeared around 2000 with unstable angina.  He required redo CABG  which was done October 2000.   Since that time, he has had multiple catheterizations for chest pain  syndrome.  He had patent grafts in December 2005, patent grafts in  December 2006, and patent grafts February 2007.  He has had an  intercurrent stenting of his distal RCA and PDA beyond the graft  insertion beyond the graft insertion by me in December 2005 which has  remained patent long term.  He has had left carotid endarterectomy  October 2003 and has had known mild AS on last 2-D echocardiogram of  April 2007.   He also has a history of PAF-flutter and underwent flutter ablation by  Dr. Launa Grill on January 23, 2005.  Long-term Coumadin was discontinued  after that.  He has prior history of gout, systemic hypertension, GERD,  and substance abuse as outlined above.  He has been compliant with  medications and was last seen as an outpatient January 2008 when he had  a negative Cardiolite for ischemia and EF not calculated because of  ectopy.   His catheterization findings are as outlined above with patent grafts,  good myocardial perfusion but significant LV dysfunction and at least  moderate aortic valvular stenosis.  Recommend medical therapy of this  and followup echocardiograms of his AS, titration and optimization of  medicines for LV dysfunction.  He is a candidate for ICD placement for  purpose of primary prevention.  He also has significant left renal  artery stenosis, and, in view of his LV dysfunction and the aortic  stenosis,  I would recommend that this be addressed and he be considered  for peripheral intervention.   CATHETERIZATION DIAGNOSIS:  1. Chest pain, etiology not determined.  Myocardial infarction ruled      out.  2. Gastroesophageal reflux disease.  3. Continued at  least moderate alcohol abuse.  4. Aortic valve stenosis, mild progression, probably moderate calcific      not symptomatic.  5. Left ventricular dysfunction, ejection fraction approximately 30-      35%  with segmental wall motion abnormality.  6. Peripheral arterial disease status post LCA October 2003.  7. Sick sinus syndrome, paroxysmal atrial flutter, past flutter      ablation January 23, 2005, Dr. Severiano Gilbert.  8. History of gout.  9. Systemic hypertension.  10.Hyperlipidemia.  11.Nonsmoker.  12.Severe left renal artery stenosis.      Richard A. Alanda Amass, M.D.  Electronically Signed     RAW/MEDQ  D:  08/13/2007  T:  08/14/2007  Job:  [id number]   cc:   [facility]  Darlin Priestly, MD  Delaney Meigs, M.D.

## 2011-05-06 NOTE — Letter (Signed)
November 09, 2008    Donald A. Alanda Amass, MD  [address]  [place], [place] [address]   RE:  [name]  MRN:  [id number]  /  DOB:  1941/02/15   Dear Dr. Alanda [name]:   It was my pleasure to see your patient, [name] in  Electrophysiology Clinic today for followup with atrial fibrillation.  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  I initially had the  opportunity to meet [name] during his hospitalization October 21, 2008, through October 24, 2008, for symptomatic atrial fibrillation with  rapid ventricular rates.  At that time, we initiated dronedarone  therapy.  Since discharge, the patient reports doing reasonably well.  He continues to have symptoms of shortness of breath, orthopnea, and  PND.  He also has chronic lower extremity edema.  The patient reports  occasional palpitations, but is unaware of episodes of heart racing.  I have interrogated his biventricular ICD today in clinic and this  reveals that his predominant rhythm is sinus rhythm with biventricular  pacing.  In fact, he is biventricularly paced 88.9% of the time.  The  patient has had episodes of atrial fibrillation with heart rates up to  140 beats per minute.  During atrial fibrillation, he is 89% sensed in  the ventricle.  His overall time in atrial fibrillation was  approximately 3 days.  The patient's device demonstrates mode switching  numerous times, most recently on November 07, 2008, in which he had more  than 20 mode switches.  The patient reports worsening symptoms of  fatigue and decreased exercise capacity at that time.   PROBLEM LIST:  1. Ischemic cardiomyopathy (ejection fraction 35%).  2. Coronary artery disease, status post CABG in 1993 and redo CABG  in      2000.  3. Mild aortic stenosis.  4. Chronic New York Heart Association class II/III heart failure      symptoms.  5. Status post biventricular ICD with a Medtronic Maxima [id number] device      implanted by Dr. Lewayne Bunting on September 14, 2007.  6. Hypertension.  7. Hyperlipidemia.  8. Peripheral vascular disease.  9. Status post cavotricuspid isthmus ablation by Dr. Severiano Gilbert January 23, 2005.  10.Mild chronic renal insufficiency.  11.Abnormal liver profile present secondary to alcohol consumption.  12.Gout.  13.Chronic anticoagulation with Coumadin.  14.History of heavy alcohol consumption.  15.Cerebrovascular disease, status post left carotid endarterectomy.   ALLERGIES:  LIPITOR intolerance.   CURRENT MEDICATIONS:  1. Multaq 400 mg twice daily.  2. Metoprolol 75 mg b.i.d.  3. Diltiazem 180 mg daily.  4. Coumadin to maintain an INR between 2 and 3.  5. Digoxin 0.0625 mg daily.  6. Enalapril 5 mg daily.  7. Vitamin B12 daily.  8. Potassium chloride 20 mEq daily.  9. Folate 2 tablets daily.  10.Lasix 60 mg daily.  11.Aspirin 81 mg daily.  12.Lorazepam 0.25 mg b.i.d.  13.Zegerid 2 tablets daily.   SOCIAL HISTORY:  The patient has a longstanding history of heavy alcohol  consumption, but he reports quitting several months ago.  He does,  however, admit to drinking rare glasses of liquor.   PHYSICAL EXAMINATION:  VITAL SIGNS:  Blood pressure 102/55, heart rate  59, respiration 18, and weight 215  pounds.  GENERAL:  The patient is well-appearing male in no acute distress.  He  is alert and oriented x3.  HEENT:  Normocephalic and atraumatic.  Sclerae clear.  Conjunctivae  pink.  Oropharynx clear.  NECK:  Supple.  JVP 9 cm.  LUNGS:  Clear to auscultation bilaterally.  HEART:  Regular rate and rhythm, 3/6 systolic ejection murmur along the  left sternal border.  GI:  Soft, nontender, and nondistended.  Positive bowel sounds.  EXTREMITIES:  No clubbing, cyanosis, or  edema.  NEUROLOGIC:  Strength and sensation are intact.  SKIN:  No ecchymosis or lacerations.  MUSCULOSKELETAL:  No deformity or atrophy.  PSYCH:  Euthymic mood and full affect.   DEVICE INTERROGATION:  The patient's biventricular ICD is interrogated  as described above.  This demonstrates a Medtronic Maxima model [id number]  device.  The battery status is good with a voltage of 3.14 volts and a  charge time of 8.13 seconds.  The atrial lead P-wave measures 3.2 mV  with an impedance of 568 ohms and a threshold of 1 volt at 0.3  milliseconds.  The right ventricular lead R-wave measures 12.5 mV with  an impedance of 376 ohms and a threshold of 1 volt at 0.2 milliseconds.  The left ventricular lead impedance measures 568 ohms with a threshold  of 1 volt at 0.4 milliseconds.  No ventricular arrhythmias have been  detected and no device therapies have been delivered.  The device has  programmed in the DDD pacing mode with a lower rate limit of 50 and an  upper tracking rate of 130 beats per minute.  The patient is observed to  have premature ventricular contractions today and therefore the V-sensed  response was programmed on today.  No other changes were made.   EKG today reveals sinus bradycardia at 57 beats per minute with  biventricular pacing.   IMPRESSION:  [name] is a very pleasant 70 year old gentleman with  an ischemic cardiomyopathy (ejection fraction 35%), coronary artery  disease, status post coronary artery bypass graft x2, New York Heart  Association class II/III heart failure symptoms chronically without  recent decompensation, and paroxysmal atrial fibrillation, who presents  today for further EP evaluation.  The patient continues to have  symptomatic episodes of atrial fibrillation with rapid ventricular rates  despite medical therapy.  He has previously failed medical therapy with  amiodarone and is currently on Multaq.  This medication appears to have  significantly  reduced both the frequency of his tachycardia as well as  his atrial fibrillation.  However, the patient remains symptomatic.  Therapeutic strategies including both medicine and catheter-based  therapies were discussed in detail with the patient today.  Risks,  benefits, and alternatives to EP study and radiofrequency ablation for  atrial fibrillation were also discussed today.  The patient wishes to  proceed with catheter ablation.   PLAN:  We will therefore schedule the patient for EP study and  radiofrequency ablation for atrial fibrillation in the near future.  He  will require a transesophageal echocardiogram on the day of or  immediately prior to the procedure to rule out left atrial thrombus  before proceeding to catheter ablation.  The importance of maintaining a  therapeutic INR between 2 and 3 in the interim was also stressed.  I  have made no medication changes today.  The patient's biventricular ICD  was  reprogrammed today with a ventricular-sensed response on to hopefully  continue to promote biventricular pacing.   Thank  you for the opportunity to participate in the care of Mr.  [name].  Please feel free to contact me should you wishes to discuss  his care further.    Sincerely,      Hillis Range, MD  Electronically Signed    JA/MedQ  DD: 11/09/2008  DT: 11/10/2008  Job #: [id number]

## 2011-05-06 NOTE — Discharge Summary (Signed)
Donald, [name] NO.:  [id number]   MEDICAL RECORD NO.:  [id number]          PATIENT TYPE:  OBV   LOCATION:  3731                         FACILITY:  [facility]   PHYSICIAN:  Darlin Priestly, MD  DATE OF BIRTH:  1941/12/19   DATE OF ADMISSION:  08/12/2007  DATE OF DISCHARGE:  08/14/2007                               DISCHARGE SUMMARY   DISCHARGE DIAGNOSES:  1. Unstable angina, catheterization this admission revealing patent      grafts and patent distal right coronary artery sites.  2. Cardiomyopathy with an ejection fraction of 35%.  3. Aortic stenosis, moderate with aortic valve area of 0.88 sq cm at      cath.  4. Peripheral vascular disease with a greater than 80% left renal      artery stenosis at cath.  5. History of paroxysmal atrial fibrillation, the patient is holding      sinus rhythm at discharge.  He was felt not to be a Coumadin      candidate at discharge.  6. Known coronary disease with coronary artery bypass grafting in 1993      and again in 2000 with subsequent percutaneous coronary      interventions.  7. History of atrial flutter ablation.  8. Peripheral vascular disease with prior left carotid endarterectomy.  9. Treated hypertension.  10.Treated dyslipidemia.  11.Gout.   HOSPITAL COURSE:  The patient is a 70 year old male followed by Dr.  Alanda Amass with coronary disease as described above.  He presented to the  emergency room with intermittent chest pain worrisome for unstable  angina.  Please see Dr. Rosaura Carpenter note for complete details.  The  patient was put on Lovenox.  Apparently he had been on heparin as an  outpatient but this was stopped because of some issues of compliance and  alcohol abuse.  The patient was admitted to telemetry.  His enzymes were  negative.  Catheterization done by Dr. Alanda Amass on August 13, 2007,  revealed patent grafts and patent distal RCA sites.  EF was 35%.  He had  an 80-90% left renal artery stenosis  with a patent right renal artery.  He had aortic stenosis described as moderate with an aortic valve area  of 0.88 cm2.  Plan is for continued medical therapy.  Dr. Alanda Amass  feels he will probably need a  prophylactic ICD.  We feel he can be  discharged August 14, 2007.   DISCHARGE MEDICATIONS:  1. Potassium 10 mEq a day.  2. Atenolol 50 mg a day.  3. Plavix 75 mg a day.  4. Allopurinol 300 mg a day.  5. Crestor 10 mg a day.  6. Nitroglycerin 0.4 mg sublingual p.r.n.  7. Lanoxin 0.125 mg a day.  8. Folic acid 1 mg a day.  9. Furosemide 20 mg a day.  10.Omeprazole 20 mg twice a day.  11.Aspirin 81 mg a day.  12.Vitamin B12.  13.Enalapril has been cut back to 2.5 mg today because of some      hypotension.   LABORATORY DATA:  INR at admission was 1.   Chest x-ray  shows post bypass with improving aeration at the bases.   BNP is 287.  LDL 42, cholesterol 148, HDL 32.  CK-MB and troponin are  negative x1.  Liver functions were normal.  At discharge, sodium 138,  potassium 3.6, BUN 11, creatinine 1.  White count 7.8, hemoglobin 12.1,  hematocrit 35.3, platelets 225.   Telemetry did document PAF but sinus rhythm at discharge.  EKG shows  sinus rhythm with PVCs.   DISPOSITION:  Patient is discharged in stable condition and will follow  up with Dr. Alanda Amass as an outpatient.      [name], P.A.      Darlin Priestly, MD  Electronically Signed    LKK/MEDQ  D:  08/14/2007  T:  08/15/2007  Job:  [id number]

## 2011-05-06 NOTE — Op Note (Signed)
NAMEJACARIE, PATE             ACCOUNT NO.:  [id number]   MEDICAL RECORD NO.:  [id number]          PATIENT TYPE:  INP   LOCATION:  4711                         FACILITY:  [facility]   PHYSICIAN:  Shirley Friar, MDDATE OF BIRTH:  28-Oct-1941   DATE OF PROCEDURE:  DATE OF DISCHARGE:                               OPERATIVE REPORT   INDICATIONS:  Anemia and abdominal pain.   MEDICATIONS:  Fentanyl 50 mcg IV and Versed 5 mg IV.   FINDINGS:  Endoscope was inserted through oropharynx and esophagus was  intubated, which was normal in its entirety.  Endoscope was passed down  to the stomach, which revealed small linear erythematous streaks in the  distal stomach consistent with minimal gastritis.  Retroflexion was  done, which revealed normal proximal stomach.  Endoscope was  straightened and advanced into the duodenal bulb and second portion of  duodenum, which were both normal.  Endoscope was withdrawn to confirm  the above findings.   ASSESSMENT:  1. Minimal gastritis, otherwise negative upper endoscopy.  2. No source of anemia seen.   PLAN:  Outpt followup.   COMPLICATIONS:  The patient had a short episode of hypoxia prior to  insertion of the endoscope after sedation and he responded to 100% non-  rebreather, which was switched to nasal cannula once his hypoxia  resolved.  The patient is transferred to recovery in good condition.      Shirley Friar, MD  Electronically Signed     VCS/MEDQ  D:  04/20/2009  T:  04/20/2009  Job:  [id number]   cc:   Gerlene Burdock A. Alanda Amass, M.D.  Petra Kuba, M.D.

## 2011-05-06 NOTE — Discharge Summary (Signed)
[name], [name]             ACCOUNT NO.:  [id number]   MEDICAL RECORD NO.:  [id number]          PATIENT TYPE:  INP   LOCATION:  2007                         FACILITY:  [facility]   PHYSICIAN:  Ritta Slot, MD     DATE OF BIRTH:  07-Jun-1941   DATE OF ADMISSION:  07/23/2009  DATE OF DISCHARGE:  07/31/2009                               DISCHARGE SUMMARY   DISCHARGE DIAGNOSES:  1. Right pleural effusion, improved.  2. Severe aortic stenosis undergoing aortic valve replacement at [facility], the latter part of July 2010 with a pericardial tissue      valve.  3. Paroxysmal atrial fibrillation, now on anticoagulation.  4. History of AVMs with acute gastrointestinal bleed, monitor closely.  5. Gout during hospitalization, improved with colchicine.  6. Ischemic cardiomyopathy with decrease in his left ventricular      function, ejection fraction less than 20%.  7. Coronary artery disease with bypass grafting and redo graft.  8. Biventricular implantable cardioverter-defibrillator.  9. Anticoagulation with Coumadin.  10.History of renal insufficiency, currently stable.   DISCHARGE CONDITION:  Improved.   DISCHARGE MEDICATIONS:  1. Amiodarone 200 mg 1 twice a day.  2. Aspirin 81 mg 2 tablets daily.  3. Coreg has been changed to 3.125 mg in the evening, 6.25 mg in the      morning.  4. Cyanocobalamin 1000 mcg daily orally.  5. Lanoxin 0.125 mg daily.  6. Colace 100 mg 2 daily.  7. Vitamin B1 daily.  8. Ferrous sulfate 325 mg daily.  9. Folic acid 1 mg 2 times a day.  10.Lasix 40 mg daily.  11.Lorazepam 1 mg twice a day if needed.  12.Magnesium oxide 400 mg was decreased from 3 times a day to only 400      mg daily.  13.Spironolactone 12.5 mg 1 daily.  14.Colchicine 0.6 mg once daily depending on diarrhea.  15.Enalapril 5 mg in the morning, 2.5 mg in the evening.  16.Coumadin 5 mg daily and as per Coumadin Clinic.  17.Multivitamin daily.  18.Nitroglycerin 0.4 mg as  needed for chest pain.  19.Protonix 20 mg 2 tabs daily.  20.Potassium chloride 20 mEq daily.  21.Tylenol/oxycodone 325/5 every 3 hours if needed for pain.   DISCHARGE INSTRUCTIONS:  1. Increase activity slowly.  May shower.  No lifting for 3 to 8      weeks.  No driving for 3 weeks as per post-CABG instructions.  2. Low-sodium heart-healthy diet.  3. Follow up with Dr. Alanda Amass, Thursday, August 09, 2009, at 2 p.m.  4. Follow up with Coumadin Clinic, [facility],      August 03, 2009, at 11:50 a.m.  5. The patient was also instructed if weight increased to increase his      Lasix to twice a day and call the office.  He was instructed that      he still has a small pleural effusion that could increase and he is      to call us if he is having more difficulties with shortness of  breath.   HOSPITAL COURSE:  [name] with a long complex history, please see H  and P, was admitted by Dr. Lynnea Ferrier after presenting to the office with  increased shortness of breath.  He was found to have a right pleural  effusion and admitted for diuretics.  The patient has a long history of  chronic systolic heart failure with ischemic cardiomyopathy, BiV-ICD,  and recent aortic valve replacement with a pericardial tissue valve as  he had severe aortic stenosis.  The patient was admitted, was given  diuretics with improvement of his symptoms.  It was felt not to choose  steroids at this time.   The patient has been AV pacing with episodes of perhaps atrial  fibrillation and was started on Coumadin.  A 2D echo was done by Dr.  Alanda Amass with a decrease in his EF.  At times, it looked as if it may  be as low as 5% on the echo and the rate is less than 20%, so he was  started on IV dobutamine to improve his cardiac function and had been  transferred to a step-down unit as he has had significant arrhythmias in  the past with dobutamine.  The patient did quite well.  No arrhythmias   were noted.  He continued to diurese.  Chest x-ray continued to improve.   By July 27, 2009, the patient was ambulating in the hall without  problems.  Chest x-ray was stable.  His dobutamine was began to wean at  1 mcg/kg every 6 hours.  He was slowly weaned and it was discontinued.  By August 10, he was stable and ready for discharge.   On discharge, blood pressure 102/68, pulse was stable, resp is 18, temp  97.6, oxygen saturation 92%.  Heart, regular rate and rhythm, improving  breath sounds, though still decreased in the right base.  Abdomen with  positive bowel sounds.  Extremities, no edema.   Please note, the patient had complained of gout on July 30, 2009, and  colchicine was started.  Prior to that, he complained of constipation,  now he has more diarrhea but not severe.  He will take his colchicine as  usual with his gout and stop if diarrhea increases.   LABORATORY DATA:  BNP on admission was 944 and prior to discharge, BNP  was 321.   Discharge basic metabolic panel, sodium 136, potassium 4, chloride 98,  CO2 30, glucose 95, BUN 14, creatinine 1.10, calcium 9.3, protime 25.9  with an INR of 2.4.   Hemoglobin 14.5 at discharge, hematocrit 40.4, WBC 6.3, platelets 299.  Magnesium level had been 2.1.   Lipids, total cholesterol 195, triglycerides 121, HDL 25, LDL was  elevated at 146.  Please note, the patient in the past has had elevated  LFTs; therefore, we have not yet added back on statin therapy.  I will  leave this to Dr. Alanda Amass at the time of office visit.   LFTs prior to discharge, SGOT 19, SGPT 13, total bili 1.0, alkaline phos  91, total protein 6.7.   Sed rate was 15 on admission.  Cardiac panel was done on admission with  CK of 16, MB of 1.1, troponin I 0.13.   2D echo, please see dictated report but EF less than 20% and MR was  present.  Chest x-rays on August 2, PA and lateral, interval  accumulation of small-to-moderate right pleural effusion,  which layers  inferiorly, unchanged cardiomegaly, mild pulmonary vascular congestion.  Followup  continues with smaller right pleural effusion on July 25, 2009.  On August 8, PA and lateral, little change in right pleural  effusions, and August 10, PA and lateral chest x-ray, cardiomediastinal  silhouette is stable, again noted status post bypass, stable 3-lead  cardiac pacemaker position, again noted small right pleural effusion  with right basilar atelectasis without change.  No acute infiltrate or  edema.  No significant change.  Stable small right pleural effusion with  right basilar atelectasis.  No pulmonary edema.  The patient did not  have acute heart failure on this admission, but maintains chronic  systolic heart failure and EKG showed AV pacing.      [name]. [place], N.P.      Ritta Slot, MD  Electronically Signed    LRI/MEDQ  D:  07/31/2009  T:  08/01/2009  Job:  [id number]   cc:   Gerlene Burdock A. Alanda Amass, M.D.  Dr. Claria Dice, M.D.

## 2011-05-06 NOTE — Letter (Signed)
January 01, 2009    Richard A. Alanda Amass, MD  [address], [address]  [place], [place] [address]   RE:  VINOD, MIKESELL  MRN:  [id number]  /  DOB:  1941-06-29   Dear Dr. Alanda Amass:   It was my pleasure to see your patient Hektor Huston in  electrophysiology followup today.  As you recall, Mr. Wickens is a 70-  year-old gentleman with multiple comorbidities including persistent  atrial fibrillation and atypical atrial flutter, ischemic  cardiomyopathy, coronary artery disease, New York Heart Association  class 2/3 heart failure symptoms chronically, status post biventricular  ICD who presents today for followup.  He reports that over the past  couple of weeks, he has developed progressive symptoms of shortness of  breath as well as chest discomfort.  He therefore presented on December 27, 2008 to [facility] and was admitted to the [facility] for further management.  He ruled out  for myocardial infarction and was subsequently discharged.  He was noted  to be quite anemic during his hospital stay requiring blood transfusion.  The patient reports several episodes of epistaxis recently.  He has also  had multiple episodes of gastrointestinal bleeding with both dark and  bright red blood per rectum.  He has an ongoing evaluation and progress  as directed by Dr. Ewing Schlein.  Since his hospital discharge, he has had no  further chest discomfort.  He feels that his shortness of breath has  significantly improved.  He has had progressive swelling in his left  ankle over the past few days without known trauma.  He has also had  difficulties maintaining a therapeutic INR and has therefore been  initiated on Lovenox.  He is otherwise without complaint today.   PROBLEM LIST:  1. Persistent atrial fibrillation and atypical atrial flutter.  2. Status post prior cavotricuspid isthmus ablation.  3. Ischemic cardiomyopathy (ejection fraction 35%).  4.  Coronary artery disease, status post CABG in 1993 and redo CABG in      2000.  5. New York Heart Association class 2/3 heart failure symptoms.  6. Status post biventricular ICD with a Medtronic Maximo 7304, ICD      implanted by Dr. Lewayne Bunting, September 14, 2007.  7. Hypertension.  8. Hyperlipidemia.  9. Peripheral vascular disease.  10.Chronic renal insufficiency.  11.Prior abnormal liver profile secondary to alcohol consumption.  12.Alcoholism.  13.Gout.  14.Chronic anticoagulation with Coumadin with difficulty maintaining a      therapeutic INR.  15.Cerebrovascular disease, status post left carotid endarterectomy.   ALLERGIES:  LIPITOR.   CURRENT MEDICATIONS:  1. Lovenox 95 mg b.i.d.  2. Multaq 400 mg b.i.d.  3. Diltiazem is discontinued.  4. Metoprolol 100 mg b.i.d.  5. Coumadin.  6. Digoxin 0.0625 mg daily.  7. Enalapril 5 mg daily.  8. Potassium chloride 20 mEq daily.  9. Vitamin B12 daily.  10.Folate daily.  11.Lasix 60 mg daily.  12.Lorazepam 0.2 mg b.i.d.  13.Zegerid 2 mg daily.  14.BiDil 100 mg daily.  15.Spironolactone 12.5 mg daily.  16.Ferrous sulfate 325 mg daily.   PHYSICAL EXAMINATION:  VITAL SIGNS:  Blood pressure 116/70, heart rate  60, respirations 18, weight 211 pounds.  GENERAL:  The patient is a well-appearing male in no acute distress.  He  is alert and oriented x3.  HEENT:  Normocephalic and atraumatic.  Sclerae clear.  Conjunctivae  pink.  Oropharynx clear.  NECK:  Supple.  No lymphadenopathy,  thyromegaly, or bruits.  LUNGS:  Clear to auscultation bilaterally.  HEART:  Regular rate and rhythm.  A 3/6 systolic ejection murmur along  the left sternal border and at the apex.  GI:  Soft, nontender, and nondistended.  Positive bowel sounds.  EXTREMITIES:  No clubbing, cyanosis.  The patient has moderate swelling  of the left foot without erythema, warmth, or tenderness.  There was no  obvious deformity.  NEUROLOGIC:  Strength and sensation are  intact.  SKIN:  No ecchymosis or laceration.  MUSCULOSKELETAL:  No deformity or atrophy.  PSYCHIATRY:  Euthymic mood, full affect.   IMPRESSION:  Mr. Wanke is a 70 year old gentleman with an ischemic  cardiomyopathy (ejection fraction 35%), coronary artery disease, New  York Heart Association class 2/3 heart failure symptoms chronically,  with atrial fibrillation and atypical atrial flutter who presents today  for followup.  He was recently hospitalized with symptoms of heart  failure.  He has been documented to have significant anemia requiring  transfusion.  He also reports GI bleeding for which he is currently  being evaluated by Dr. Ewing Schlein.  He reports that a colonoscopy is  currently being scheduled.  The patient also has significant left foot  swelling, which has been present over the past few days.  He currently  presents in sinus rhythm with biventricular pacing.   PLAN:  I think that before we proceed to EP study and radiofrequency  ablation, we need to further evaluate the patient's anemia and GI  bleeding.  During the ablation procedure, he would receive significant  intravenous heparin and the importance of maintaining a therapeutic INR  following ablation will be significant for at least 8 weeks.  I  therefore think that before we proceed with ablation, we should make  attempts to treat his anemia and GI bleeding.  I therefore would  recommend a colonoscopy in the near future.  I postponed his atrial  fibrillation ablation for several weeks.  In the meantime, we should  continue to keep his INR therapeutic between 2 and 3 as able.  I have  encouraged the patient to maintain elevation of his left foot and apply  warm compresses as able.  He is instructed to contact his primary care  physician for further evaluation.  If his foot remains swollen in 2-3  days, he has scheduled followup in your office within 2 weeks.   Thank you for the opportunity to participating in the  care of Mr.  Reffett .  Please feel free to contact me should you wish to discuss  his care further.    Sincerely,      Hillis Range, MD  Electronically Signed    JA/MedQ  DD: 01/01/2009  DT: 01/02/2009  Job #: [id number]   CC:    Petra Kuba, M.D.

## 2011-05-06 NOTE — Discharge Summary (Signed)
[name], [name]             ACCOUNT NO.:  [id number]   MEDICAL RECORD NO.:  [id number]          PATIENT TYPE:  INP   LOCATION:  4731                         FACILITY:  [facility]   PHYSICIAN:  Richard A. Alanda Amass, M.D.DATE OF BIRTH:  October 09, 1941   DATE OF ADMISSION:  11/11/2008  DATE OF DISCHARGE:  11/15/2008                               DISCHARGE SUMMARY   DISCHARGE DIAGNOSES:  1. Acute on chronic systolic congestive failure, improved at      discharge.  2. Ischemic cardiomyopathy with an ejection fraction of 35%.  3. History of paroxysmal atrial fibrillation, the patient has been on      Multaq.  4. History of biventricular implantable cardioverter-defibrillator.  5. Amiodarone intolerance because of elevated LFTs in the past.  6. Known coronary artery disease with prior bypass surgery, currently      stable.  7. Anemia with guaiac-positive stools this admission, endoscopy this      admission essentially normal.  8. History of alcohol abuse.  9. Peripheral vascular disease with prior left carotid endarterectomy.   HOSPITAL COURSE:  The patient is a 70 year old male well known to Dr.  Alanda Amass with a long history of alcohol problems, although he is  relatively reliable.  He often forgets to bring his medicines to the  office.  He sometimes is not keep his appointments.  Even though, he  says he is not drinking apparently, does still drink some.  He is  admitted with heart failure recently.  He had had to be taken off  amiodarone this summer because of elevated LFTs and I believe also TSH  abnormality.  He was put on Multaq to try and keep him in sinus rhythm  as he does not tolerate atrial fibrillation well.  The patient was just  discharged on October 24, 2008 with recurrent atrial flutter.  He had  been seen in consult by Dr. Johney Frame with [facility].  The plan was for  several weeks of therapeutic INR and then attempt at left-sided an  atrial flutter ablation.  In the  interim, the patient was readmitted on  November 11, 2008 with shortness of breath and heart failure again.  He  was started on IV diuretics.  His INR was 1.9.  His LFTs were still  slightly elevated and AST of 62 and ALT of 49.  BNP on admission was  1500.  He improved with diuresis.  Dr. Alanda Amass felt he was now in  sinus rhythm.  We have resumed his Multaq from home.  We were actually  going to send him home on November 14, 2008, but it was noted that his  hemoglobin had dropped at 10.  Rectal exam revealed guaiac-positive  brown stool.  He was seen in consult by Dr. Evette Cristal for Dr. Ewing Schlein who had  seen him in the past.  Endoscopy was done on November 15, 2008 and this  was essentially normal.  At some point, he will need further evaluation  with possible colonoscopy as an outpatient.  We will plan to discharge  him today and continue with plans for Coumadin with INR goal  of 2-2.5  for several weeks in a row and then his ablation.  We will have to also  keep a track of his blood counts.  If he is only able to continue to  take Coumadin, those plans all may need to be put on hold and he may  have to pursue a GI evaluation.  He is to have a protime and hemoglobin,  hematocrit on Monday.   DISCHARGE MEDICATIONS:  1. Aspirin 81 mg a day.  2. Lanoxin 0.625 mg a day.  3. Enalapril 5 mg a day.  4. Folic acid 1 mg twice a day.  5. Lasix 40 mg a day.  6. Potassium 20 mEq a day.  7. Lorazepam 0.25 mg twice a day.  8. Metoprolol 50 mg twice a day.  9. Multaq 400 mg twice a day.  10.Vitamin B1.  11.Vitamin B12.  12.Warfarin 5 mg a day or as directed.  13.Aldactone 12.5 mg a day.  14.Nu-Iron 150 daily.  15.Zegerid 40/100 twice a day.   LABS:  White count 6.9, hemoglobin 11.7, hematocrit 34.5, platelets 263.  INR is 2.  Sodium 130, potassium 3.8, BUN 37, creatinine 1.6.  BNP was  527 on November 14, 2008.  Chest x-ray shows cardiomegaly with mild  interstitial edema on admission.    DISPOSITION:  The patient is discharged in stable condition and will  follow up with Dr. Alanda Amass, Dr. Johney Frame, and Dr. Ewing Schlein.      [name], P.A.      Richard A. Alanda Amass, M.D.  Electronically Signed    LKK/MEDQ  D:  11/15/2008  T:  11/16/2008  Job:  [id number]   cc:   Petra Kuba, M.D.  Hillis Range, MD

## 2011-05-06 NOTE — Discharge Summary (Signed)
[name], [name]             ACCOUNT NO.:  [id number]   MEDICAL RECORD NO.:  [id number]          PATIENT TYPE:  INP   LOCATION:  3706                         FACILITY:  [facility]   PHYSICIAN:  Richard A. Alanda Amass, M.D.DATE OF BIRTH:  1941/05/20   DATE OF ADMISSION:  12/27/2008  DATE OF DISCHARGE:  12/30/2008                               DISCHARGE SUMMARY   DISCHARGE DIAGNOSES:  1. Acute-on-chronic systolic heart failure.  2. Chronic congestive systolic heart failure, class III.  3. Ischemic cardiomyopathy, ejection fraction 35-45% by 2-D echo,      November 2009.      a.     Biventricular implantable cardioverter-defibrillator.  4. History of atrial flutter and atrial fibrillation.      a.     The patient was scheduled for TEE and ablation on January 08, 2009 and January 09, 2009, but had not yet had done at the       time of discharge as previously had incorrect date.  5. Acute blood loss anemia with bloody stools.  During this      hospitalization, hemoglobin 8.4, blood transfusion of 1 unit of      packed cells.  6. Epistaxis, stopped the discharge.  7. Chest pain, atypical, negative myocardial infarction.  8. Hypothyroidism with TSH 9.8 on admission, but on recheck was 1.6.  9. Nonsustained ventricular tachycardia.   DISCHARGE MEDICATIONS:  1. Stop aspirin.  2. Lanoxin 0.0625 mg one-half of 0.125 mg daily.  3. Enalapril 5 mg daily.  4. Folic acid 1 mg twice a day.  5. Furosemide 60 mg daily.  6. K-Dur 20 mEq daily.  7. Lorazepam 0.25 mg twice a day.  8. Metoprolol tartrate has been increased to 100 mg twice a day.  9. Multaq 400 mg 2 times day.  10.Vitamin B1 100 mg twice a day.  11.Vitamin B12 1000 mcg daily.  12.Continue spironolactone 25 mg half a tab daily.  13.Continue Zegerid 40/1100 twice a day.  14.Lovenox 95 mg subcu every 12 hours.  15.Ferrous sulfate 325 mg one twice a day for iron.  16.Coumadin take 10 mg on December 30, 2008, take 7.5 mg on December 31, 2008, and then have blood work done on January 01, 2009.  17.Nitroglycerin 1/150 sublingual as needed for chest pain.   DISCHARGE INSTRUCTIONS:  1. Increase activity slowly.  2. Low-sodium heart-healthy diet.  3. Follow up with Dr. Alanda Amass, our office will call you with date      and time.  4. Have blood work done on January 01, 2009 for Coumadin and      hemoglobin.  5. Stop aspirin.   HISTORY OF PRESENT ILLNESS:  [name] is well known to [facility] with history of coronary artery disease, ischemic  cardiomyopathy, New York Heart Classification III of heart failure with  BiV ICD.   The patient presented to the emergency room on December 27, 2008, after  calling our office, referred secondary to chest pain, shortness of  breath, history of bypass grafting in 1992  with redo in 2000, carotid  enterectomy in 2003.  Last cath was in August 2008 with patent grafts.  He has been hospitalized with heart failure recently.  He has some  flutter.  He is to have AFib or Aflutter ablation, but because of heme-  positive stools, it has been postponed currently.  The patient had  originally been off his Coumadin for plans of ablation, but on December 27, 2008, he had increasing dyspnea and chest pain.  The chest pain was  worse with movement of his upper body, not necessarily with walking.  He  also had epistaxis, he was admitted.  Hemoglobin was low and he was  transfused 1 unit of packed cells.  He had heme-positive stools.  His  aspirin was discontinued because of this.  By December 29, 2008, he was  improved.  Though his INR was subtherapeutic, he was started on Lovenox,  and plan was to discontinue when his INR was greater than 2.  By December 30, 2008, he was stable.  TSH was back to normal, unsure why the rise on  admission.  His daughter is an Charity fundraiser and gives him Lovenox.  On December 30, 2008, he ambulated and no further problems and was ready for discharge   home.  We will follow up as an outpatient as stated.   LABORATORY DATA:  Hemoglobin originally 8.4 in the emergency room, did  not have all the lab values.  The lab has not had all these come to the  chart.   After 1 unit of packed cells, hemoglobin came up to 10.3, hematocrit 13  and maintained, WBC 6-8.7, and platelets 180-202.  INR on December 28, 2008, was 2.1.  Chemistry at discharge; sodium 130, potassium 3.5 that  was replaced, chloride 93, CO2 of 28, glucose 94, BUN 17, creatinine  1.16, and calcium 8.7.  Cardiac enzymes were all negative with CKs  ranging 24-29.  MBs 1.0-0.8, and troponin I 0.02.  BNP on prior to  discharge was 606.   On admission, BNP was 1064.   Total cholesterol 212, triglycerides 144, HDL 27, and LDL 156.  Initial  TSH was 9.84, followup was 1.648 without change of medications, T4 was  9.3, free T4 was 1.19, and T3 was 65.4.   Iron was 30, TIBC was 383, iron percentage was 8, UIBC was 353, vitamin  B12 was 700, folate greater than 20, and ferritin 56.  UA was clear.   EKGs at discharge; sinus rhythm, right bundle-branch block.  On  admission, EKG was Aflutter with 3:1 block, nonspecific lateral changes.       [name]. [place], N.P.      Richard A. Alanda Amass, M.D.  Electronically Signed    LRI/MEDQ  D:  02/13/2009  T:  02/14/2009  Job:  [id number]   cc:   Hillis Range, MD  Petra Kuba, M.D.

## 2011-05-06 NOTE — Discharge Summary (Signed)
NAMEMICHAIL, [name]             ACCOUNT NO.:  [id number]   MEDICAL RECORD NO.:  [id number]          PATIENT TYPE:  INP   LOCATION:  2015                         FACILITY:  [facility]   PHYSICIAN:  Richard A. Alanda Amass, M.D.DATE OF BIRTH:  04-14-1941   DATE OF ADMISSION:  02/08/2009  DATE OF DISCHARGE:  02/13/2009                               DISCHARGE SUMMARY   DISCHARGE DIAGNOSES:  1. Respiratory distress with bilevel positive airway pressure in the      emergency room, resolved.  2. Acute on chronic systolic heart failure/flash pulmonary edema,      improved and resolved.  3. Ventricular tachycardia, heart rate less than ICD detection rate.      Ventricular rate was 145.  4. Atrial fibrillation flutter pending ablation, had been on Multaq      and discontinued.  5. Colonoscopy a day prior to admission with increased heart failure      after the prep and also electrolyte imbalance, now resolved.  6. Hypokalemia, resolved.  7. Ischemic cardiomyopathy, ejection fraction 35%.      a.     The patient has BiV ICD Medtronic.  8. History of amiodarone intolerance secondary to elevated liver      function tests though currently repeat ventricular tachycardia on      Multaq, restarted amiodarone.  9. History of alcohol abuse.  10.Peripheral vascular disease with endarterectomy.  11.Coronary artery disease with history of coronary artery bypass      graft and redo coronary artery bypass graft in 2008, patent graft.  12.Anemia with heme-positive stool.  Endoscopy was in November 2009.      Colonoscopy was on February 07, 2009.  13.Plans for repeat radiofrequency ablation of atrial flutter with Dr.      Johney Frame in the near future.  14.Hyperlipidemia, treated.  15.Increased gout with diuresing.   Please note the patient did develop more gout in his thumb and both  ankles but at discharge, he was feeling improved and the gout symptoms  were almost gone.   DISCHARGE CONDITION:   Improved.   PROCEDURES:  None.   DISCHARGE MEDICATIONS:  1. Enalapril 5 mg daily as before.  2. Spironolactone 12.5 mg daily.  3. Lanoxin 0.125 mg half tab daily.  4. Folic acid 1 mg two daily.  5. Lasix 40 mg one daily.  6. Aspirin 162 mg daily.  This was increased from 81 mg daily.  7. Lorazepam 0.5 mg half a tablet twice a day that equals 0.25 mg      twice a day.  8. Crestor 5 mg daily.  9. Stop metoprolol.  10.We have changed metoprolol to Coreg 3.125 mg twice a day.  11.Zegerid 2 daily.  12.Coumadin, take 7.5 mg on February 13, 2009, instead of the usual 10      mg and then resume 7.5 mg daily except Tuesday, Thursdays, and      Saturdays 10 mg.  13.Continue colchicine 0.6 mg twice a day.  14.Continue ferrous sulfate 325 mg one daily.  15.Amiodarone 200 mg 2 tablets daily.  Take all 400 mg daily (Please  note here in the hospital, he was on 400 mg b.i.d.).  16.Stop Multaq.  17.Lovenox 100 mg subcu every 12 hours.  Continue until Coumadin      Clinic ask him to stop this.  18.Nitroglycerin sublingual as needed as before.   DISCHARGE INSTRUCTIONS:  1. Low-sodium heart-healthy diet.  2. Increase activity slowly.  3. See Dr. Alanda Amass on March 02, 2009, at 2:45 p.m.  4. Coumadin Clinic February 15, 2009, at 11:45 a.m.  Take Lovenox      until clinic stops the Lovenox.  5. Call the office for shortness of breath or any bleeding.  6. Again, stop taking Multaq and metoprolol.   HISTORY OF PRESENT ILLNESS:  Mr. Geathers presented to the emergency  room on February 08, 2009.  He had had a colonoscopy on the 17th and had  taken a fair amount of liquids and prepped for this.  He has long  extensive cardiac history, well documented in most of his charts, but on  the morning of February 08, 2009, he awoke at 3:00 a.m. with shortness  of breath and sore throat, unable to sleep lying down.  He sat up, but  the symptoms worsened.  He had chest heaviness and back pain,   palpitations, felt like he was going to pass out.  He was very pale and  diaphoretic.  He complained of just not being able to breathe.  EMS was  called.  They reported intermittent ventricular tachycardia, but no  shocks.  He was in severe respiratory distress on arrival, refusing  CPAP, eventually agreed to the BiPAP and because of ventricular  tachycardia was started on IV amiodarone.  IV nitroglycerin was started  as well and he had paced rhythm with atrial fib.  Cardiac enzymes were  negative.  Chest x-ray only revealed mild pulmonary edema.   The patient was admitted to the CCU.  He had his device interrogated.  His ventricular tachycardia was 145, which is less than 176 as detection  [name] for V-tach, he said his device did not go off.  He was diuresed  and managed closely and came off the BiPAP.  His potassium was replaced.  He continued to improve.  Please note we discontinued his Multaq and put  him back on his amiodarone at 400 b.i.d.  Continued to improve on a  daily basis, but at times with hypokalemia.  He was also hypotensive at  times and medications were held on the 23rd until his blood pressure  stabilized.  By the 22nd, the Lasix was changed to p.o.  Lovenox was  added while his INR was not yet therapeutic and by the 23rd he could  ambulate in the hall, had no further problems and was ready for  discharge.  Dr. Jacinto Halim decreased the amiodarone to 400 mg daily at  discharge, and he will follow up with Dr. Johney Frame for his  ablation.  He was stable on discharge and felt very well.  He will  continue the Lovenox as an outpatient, and his next INR will be on  Thursday.  He will follow up with Dr. Alanda Amass though he knows to call  if he has problems prior to seeing Dr. Alanda Amass.      [name]. [place], N.P.      Richard A. Alanda Amass, M.D.  Electronically Signed    LRI/MEDQ  D:  02/13/2009  T:  02/14/2009  Job:  [id number]   cc:   [name] A. Alanda Amass, M.D.  Lavada Mesi  Magod, M.D.  Donald Range, MD

## 2011-05-06 NOTE — H&P (Signed)
NAMEREGGINALD, PASK NO.:  [id number]   MEDICAL RECORD NO.:  [id number]          PATIENT TYPE:  INP   LOCATION:  4731                         FACILITY:  [facility]   PHYSICIAN:  Vania Rea, M.D. DATE OF BIRTH:  1941/07/06   DATE OF ADMISSION:  09/06/2008  DATE OF DISCHARGE:                              HISTORY & PHYSICAL   PRIMARY CARE PHYSICIAN:  Unassigned.   CARDIOLOGIST:  Dr. Alanda Amass.   CHIEF COMPLAINT:  Shortness of breath x2 days.   HISTORY OF PRESENT ILLNESS:  This is a 70 year old Caucasian gentleman  with multiple medical problems including ischemic cardiomyopathy with an  ejection fraction of 25%-35% status post biventricular AICD who has no  past history of pneumonia but who has been having difficulty breathing,  fever and cough for the past 2 days.  Patient says he has no sick  contacts but has been having weakness, malaise associated with these  symptoms.   PAST MEDICAL HISTORY:  Includes:  1. Cerebral vascular disease.  2. Ischemic cardiomyopathy as noted above status post CABG on two      different occasions.  3. History of gout.  4. History of hyperlipidemia.  5. History of hypertension.  6. History of A-Fib, A-flutter with sick sinus syndrome for which a      pacemaker was placed in September 2008.  7. History of left carotid endarterectomy.  8. Past history of chronic alcohol abuse.   MEDICATIONS:  Include:  1. Coumadin 5 mg Tuesday, Wednesday, Friday, Saturday, Sunday and 7.5      mg Monday and Thursday.  2. Aspirin 81 mg daily.  3. Folic acid 2 mg daily.  4. Thiamine 100 mg daily.  5. Potassium chloride 20 mEq daily.  6. Vitamin B12 100 mcg daily.  7. Allopurinol 300 mcg daily.  8. Enalapril 10 mg daily.  9. Digoxin 0.0625 mg daily.  10.Furosemide 60 mg daily.  11.Ativan 0.25 mg twice daily.  12.Colchicine 0.6 mg twice daily.  13.Amiodarone 200 mg daily.  14.Metoprolol 75 mg twice daily.  15.Omeprazole 20 mg daily.   ALLERGIES:  LIPITOR causes myalgias.   SOCIAL HISTORY:  He is a retired Visual merchandiser.  Denies tobacco abuse.  Currently drinks 1-2 beers per day.  Formerly a very heavy drinker.   FAMILY HISTORY:  Significant only for coronary artery disease.   REVIEW OF SYSTEMS:  Other than note above, 10-point review of systems is  unremarkable.   PHYSICAL EXAMINATION:  Pleasant elderly Caucasian gentleman reclining in  the stretcher in no acute distress.  VITAL SIGNS:  Temperature is 101.5.  His pulse is 73.  Respiration 22.  His blood pressure is 117/58.  He is saturating at 98% on 2 L.  He is in  no pain.  HEENT:  Pupils are round and equal.  Mucous membranes pink and  anicteric.  He has no cervical lymphadenopathy.  No thyromegaly.  Has a  left scar from a left carotid endarterectomy.  CHEST:  He has coarse crackles bilaterally in the bases.  CARDIOVASCULAR:  Regular rhythm with a high-pitch 3/6 systolic murmur  along the left sternal  border and a lower-pitch grade 3/6 systolic  murmur radiating to the right parasternal area.  ABDOMEN:  Obese, soft and nontender.  There are no masses.  EXTREMITIES:  Without edema.  He has 2+ pulses bilaterally.  CENTRAL NERVOUS SYSTEM:  Cranial nerves II through XII grossly intact.  He has no focal neurological deficits.   LABORATORIES:  His white count is normal at 7.8, hemoglobin 12.1, MCV  92, platelets 186.  He has 85% neutrophils.  His sodium is 136,  potassium 3.4, chloride 100, CO2 27, BUN 11, creatinine 1.2.  His PT is  24.6.  His INR is 2.1.  His digoxin is 0.4.  Cardiac markers are  completely normal.  His x-ray shows a right lower lobe air space disease  most compatible with pneumonia and a trace right pleural effusion.   ASSESSMENT:  1. Community-acquired pneumonia.  2. Ischemic cardiomyopathy with systolic heart failure.  3. Hypokalemia.  4. History of gout.  5. History of hypertension.  6. Chronic Coumadin therapy.   PLAN:  We will admit this  gentleman for intravenous antibiotics and we  will continue his home medications.  When his fever is resolved, he can  be discharged home to continue on oral antibiotics.  Other plans as per  orders.      Vania Rea, M.D.  Electronically Signed     LC/MEDQ  D:  09/06/2008  T:  09/06/2008  Job:  [id number]   cc:   Gerlene Burdock A. Alanda Amass, M.D.

## 2011-05-06 NOTE — Op Note (Signed)
[name], [name]             ACCOUNT NO.:  [id number]   MEDICAL RECORD NO.:  [id number]          PATIENT TYPE:  INP   LOCATION:  3733                         FACILITY:  [facility]   PHYSICIAN:  Doylene Canning. [name], MD    DATE OF BIRTH:  [birth year]   DATE OF PROCEDURE:  09/14/2007  DATE OF DISCHARGE:                               OPERATIVE REPORT   PROCEDURE PERFORMED:  Implantation of biventricular implantable  cardioverter-defibrillator.   INDICATIONS:  Ischemic cardiomyopathy, congestive heart failure class  III, QRS duration 135 milliseconds.   INTRODUCTION:  The patient is 66-year male with a history of atrial  fibrillation, congestive heart failure, ischemic cardiomyopathy, EF 30%  who is admitted with worsening heart failure.  He spontaneously returned  to sinus rhythm.  He is now referred for bi-V ICD insertion secondary to  all the above.  The patient has also has nonsustained ventricular  tachycardia.   PROCEDURE:  After informed consent was obtained, patient was taken to  the diagnostic EP lab in fasting state.  After the usual preparation and  draping, intravenous fentanyl and Midazolam was given for sedation.  30  mL lidocaine was infiltrated left infraclavicular region.  7 cm incision  was carried out over this region.  Electrocautery utilized to dissect  down the fascial plane.  10 mL of contrast was injected into the left  upper extremity venous system demonstrating a patent left subclavian  vein.  It was sharply punctured x3 with the [employer]  Secure model [phone] 65-cm active fixation defibrillation lead, serial  number [id number] being advanced into the right ventricle.  The lead was  placed on the RV septum.  At this point attention then turned to  placement of the atrial lead which was a Medtronic model 5076 52-cm  active fixation pacing lead, serial number [id number] which was advanced  to the right atrium.  Again mapping was carried out.  At the  final site  the P-waves were 2 mV, pacing threshold 0.5 at 0.5 and pace impedance  709 ohms.  At this at this point 10 volts pacing in both right atrium  and right ventricle did not stimulate diaphragm.  With these  satisfactory parameters, the guiding catheter was advanced into the  right atrium and the coronary sinus was cannulated without particular  difficulty.  The Medtronic model 5194 88-cm passive fixation pacing lead  serial number [id number] was advanced into the coronary sinus after  venography the coronary sinus demonstrated a large lateral vein.  This  was the vein utilized for LV lead placement.  She is placed  approximately 2/3 to 3/4 distance from base to apex and at this location  on the left ventricle, the LV waves measured 13.  The pacing impedance  was 1300 ohms, threshold 0.4 volts at 0.5 milliseconds and 10 volts  pacing did not stimulate the diaphragm.  With all the above, the leads  were secured to subpectoralis fascia with a  figure-of-eight silk suture  and the sewing sleeve also secured with silk suture.  Electrocautery  utilized to make a subcutaneous pocket.  Kanamycin irrigation utilized  to irrigate the pocket and electrocautery utilized to assure hemostasis.  The Medtronic Insync Maximo model [id number] biventricular ICD, serial number  [id number] was then connected to the atrial RV and LV leads and placed  back in the subcutaneous pocket.  Defibrillation threshold testing was  then carried out.   After the patient was more deeply sedated with fentanyl and Versed, VF  was induced with T-wave shock and a 15 joules shock was delivered which  terminated VF and restored sinus rhythm.  At this point with a large  safety margin no additional defibrillation threshold testing was carried  out and the incision closed with layer of 2-0 Vicryl followed by layer  of 3-0 Vicryl followed by layer of 4-0 Vicryl.  Benzoin painted on the  skin, Steri-Strips were applied and  pressure dressing placed.  The  patient was returned to his room in satisfactory condition.   COMPLICATIONS:  There were no immediate procedure complications.   RESULTS:  These demonstrate successful implantation of a Medtronic  biventricular ICD in a patient with an ischemic cardiomyopathy,  congestive heart failure and QRS duration of 135 milliseconds.      Doylene Canning. [name], MD  Electronically Signed     GWT/MEDQ  D:  09/14/2007  T:  09/14/2007  Job:  [id number]   cc:   Gerlene Burdock A. Alanda Amass, M.D.

## 2011-05-06 NOTE — Op Note (Signed)
[name], FURRY NO.:  [id number]   MEDICAL RECORD NO.:  [id number]          PATIENT TYPE:  INP   LOCATION:  2021                         FACILITY:  [facility]   PHYSICIAN:  Charlynne Pander, D.D.S.DATE OF BIRTH:  02-08-1941   DATE OF PROCEDURE:  05/15/2009  DATE OF DISCHARGE:                               OPERATIVE REPORT   PREOPERATIVE DIAGNOSES:  1. Cardiomyopathy.  2. Aortic stenosis.  3. Preaortic valve surgery dental protocol.  4. Chronic periodontitis.  5. Apical periodontitis.  6. Extensive dental caries.  7. Multiple retained root segments.   POSTOPERATIVE DIAGNOSES:  1. Cardiomyopathy.  2. Aortic stenosis.  3. Preaortic valve surgery dental protocol.  4. Chronic periodontitis.  5. Apical periodontitis.  6. Extensive dental caries.  7. Multiple retained root segments.   OPERATION:  1. Extraction of remaining teeth (tooth numbers 1, 2, 3, 4, 5, 6,7, 8,      9, 10, 11, 12, 13, 14, 15, 16, 20, 22, 23, 24, 25, 26, 27, 28, 29,      and 32).  2. Four quadrants of alveoloplasty.   SURGEON:  Charlynne Pander, DDS   ASSISTANT:  Zettie Pho (dental assistant).   ANESTHESIA:  Monitored anesthesia care per the Anesthesia Team.   MEDICATIONS:  1. Ancef 1 g IV prior to invasive dental procedures.  2. Local anesthesia with a total utilization of 7 carpules, each      containing 34 mg of lidocaine with 0.017 mg of epinephrine as well      as 2 carpules containing 9 mg of bupivacaine with 0.009 mg of      epinephrine.   SPECIMENS:  There were 26 teeth that were discarded.   DRAINS:  None.   CULTURES:  None.   COMPLICATIONS:  None.   ESTIMATED BLOOD LOSS:  100 mL.   FLUIDS:  600 mL of normal saline solution.   INDICATIONS:  The patient was recently admitted and found to have  cardiomyopathy with aortic stenosis.  The patient was anticipated aortic  valve surgery in future.  The patient was seen as part of preheart valve  surgery  dental protocol evaluation.  The patient was examined and  planned for multiple extractions with alveoloplasty and gross  debridement of remaining dentition.  This treatment plan was formulated  to decrease the risk and complications associated with dental infection  from affecting the patient's systemic health and anticipated heart valve  surgery.   OPERATIVE FINDINGS:  The patient was examined in the operating room #2.  The teeth were identified for extraction.  Please note: The patient was  initially planned for selective extractions, although after further  evaluation of the extensive dental caries, it was determined that there  were many more nonrestorable teeth due to either dental caries or the  significant amount of a chronic gum disease.  Due to the overall poor  condition of his remaining teeth from a dental caries and chronic  periodontal standpoint, all teeth were deemed to be necessary to be  extracted at this time with alveoloplasty as indicated.   DESCRIPTION OF PROCEDURE:  The patient was brought to the main operating  room #2.  The patient was then placed in the supine position on the  operating room table.  Monitored anesthesia care was then induced per  the Anesthesia Team.  The patient was then prepped and draped in the  usual manner for a dental medicine procedure.  A time-out was performed.  The patient was identified and procedures were verified.  The oral  cavity was then thoroughly examined findings noted above.  The patient  treatment plan was changed from selective extractions with alveoloplasty  and initial periodontal therapy to extraction of all remaining teeth  with alveoloplasty as indicated.  The patient was then ready for the  dental medicine procedure as follows:   Local anesthesia was administered sequentially with a total utilization  of 7 carpules each containing 34 mg of lidocaine with 0.017 mg of  epinephrine as well as 2 carpules each containing  9 mg of bupivacaine  with 0.009 mg of epinephrine.   The maxillary left and right quadrants were first approached.  Anesthesia was delivered via infiltration utilizing the lidocaine with  epinephrine.  The maxillary anterior teeth were then approached.  A 15-  blade incision was made from the distal of #11 extended to the distal of  #6, surgical flap was then carefully reflected.  Appropriate amounts of  buccal and interseptal bone were removed at this time to expose the  retained roots in the area of number 7 through 10.  These roots were  then subluxated with a series of straight elevators.  Tooth numbers 7,  8, 9, and 10 were then removed with a 150 forceps without further  complications.  Alveoplasty was then performed utilizing rongeurs and  bone file.  The surgical site was irrigated with copious amounts of  sterile saline.  A piece of Surgicel was placed in each extraction  socket appropriately.  Surgical site was then closed from the mesial #6  to the mesial #11 utilizing 3-0 chromic gut suture and a continuous  interrupted suture technique x1.  At this point in time, 15-blade  incision was then made from the maxillary left tuberosity and extended  to the mesial #11, surgical flap was then reflected.  Appropriate  amounts of buccal and interseptal bone were removed with a surgical  handpiece and bur and copious amounts sterile saline around tooth  numbers 16, 15, 14, 13, 12, and 11.  These teeth were then subluxated  with a series of straight elevators.  Tooth numbers 16, 15, and 14 were  then removed with a 53L forceps without complications.  Tooth numbers  13, 12, and 11 were then removed with a 150 forceps without  complications.  Alveoplasty was then performed utilizing rongeurs and  bone file.  The surgical site was then irrigated with copious amounts of  sterile saline.  A piece of Surgicel was then placed in each extraction  socket appropriately.  Surgical site was then  closed from the maxillary  left tuberosity and extended to the mesial of #11 utilizing 3-0 chromic  gut suture in a continuous interrupted suture technique x1.  Two  individual interrupted sutures were then placed to further close the  surgical site.   At this point in time, the maxillary right quadrant was approached.  A  15-blade incision was made from the distal of the maxillary right  tuberosity and extended to the mesial of #6, surgical flap was then  carefully reflected.  Appropriate amounts of  buccal and interseptal bone  were removed with a surgical handpiece and bur and copious amounts of  sterile saline.  The teeth were then subluxated with a series of  straight elevators.  Tooth numbers 1, 2, and 3 were then removed with a  53R forceps without complications.  Tooth numbers 4, 5, and 6 were then  removed with a 150 forceps without complications.  Alveoplasty was then  performed utilizing rongeurs and bone file.  The tissues were  approximated and trimmed appropriately.  The surgical site was irrigated  with copious amounts of sterile saline.  A piece of Surgicel was then  placed in each extraction socket appropriately.  The surgical site was  then closed from the maxillary right tuberosity and extended to the  mesial of #6 utilizing 3-0 chromic gut suture in a continuous  interrupted suture technique x1.   At this point in time, the mandibular arch was approached.  The patient  was given bilateral inferior alveolar nerve blocks utilizing the  bupivacaine with epinephrine.  Further infiltration was then achieved  utilizing the lidocaine with epinephrine.  Retained root in the area of  #20 was then removed with rongeurs without complications.  A 15-blade  incision was then made from the distal of #22 and extended to the distal  of #29. A surgical flap was then carefully reflected.  The lower teeth  were then subluxated with a series of straight elevators.  Tooth numbers  22,  23, 24, 25, and 26 were then removed with a 151 forceps without  complications.  Further bone was then removed around tooth numbers 27,  28, and 29 appropriately.  These teeth were then subluxated with a  series of straight elevators.  Tooth numbers 27, 28, and 29 were then  removed with a 151 forceps without complications.  Alveoloplasty was  then performed utilizing rongeurs and bone file to the mandibular left  and mandibular right quadrants appropriately.  At this point in time, a  15-blade incision was extended from the mesial of #32 and extended to  the distal #29, surgical flap was then further reflected.  Buccal and  interseptal bone was removed around tooth #32.  This tooth was then  subluxated with a series of straight elevators.  The coronal aspect of  tooth #32 was then removed with a 17 forceps at this time.  This left  the roots remaining.  Further bone was then removed around remaining  roots and the roots were then eventually elevated out with a Air traffic controller.  Alveoplasty was then performed utilizing rongeurs and bone  file to the remaining surgical site.  The surgical site was then  irrigated with copious amounts of sterile saline.  A piece of Surgicel  was then placed in each extraction socket appropriately.  The tissues  were then approximated and trimmed appropriately.  The surgical site was  then closed from the distal of #21 and extended to the mesial of #24  utilizing 3-0 chromic gut suture in a continuous interrupted suture  technique x1.  The mandibular right quadrant was then approached and the  surgical site was then closed from distal of #32 and extended to the  mesial of #27 utilizing 3-0 chromic gut suture in a continuous  interrupted suture technique x1.  Six individual interrupted sutures  were then placed to further close the surgical site.  At this point in  time, the entire mouth was irrigated with copious amounts of sterile  saline.  The patient was  examined for complications, seeing none, the  dental medicine procedure was deemed to be complete.  A series of 4x4  gauze were then placed in the mouth to aid hemostasis.  The patient was  then handled over to the anesthesia team for final disposition.  After  appropriate amount of time, the patient was taken to the Post Anesthesia  Care Unit with stable vital signs and good oxygenation level.  All  counts were correct for the dental medicine procedure.  The patient will  be followed appropriately with the Cardiology and  Cardiothoracic Surgery Team as indicated.  Sutures will dissolve on  their own in 7-10 days.  The patient will be given a postop Amicar rinse  rinsing with 10 mL every hour for the next 10 hours to aid hemostasis.  Heparin may be restarted approximately 11 p.m. tonight if no significant  ooze was present.  Heparin will be restarted with no bolus.      Charlynne Pander, D.D.S.  Electronically Signed     RFK/MEDQ  D:  05/15/2009  T:  05/16/2009  Job:  [id number]   cc:   Sheliah Plane, MD  Richard A. Alanda Amass, M.D.

## 2011-05-06 NOTE — Discharge Summary (Signed)
[name], [name]             ACCOUNT NO.:  [id number]   MEDICAL RECORD NO.:  [id number]          PATIENT TYPE:  INP   LOCATION:  4731                         FACILITY:  [facility]   PHYSICIAN:  Altha Harm, MDDATE OF BIRTH:  08/02/1941   DATE OF ADMISSION:  09/06/2008  DATE OF DISCHARGE:  09/11/2008                               DISCHARGE SUMMARY   DISCHARGE DISPOSITION:  Home.   FINAL DISCHARGE DIAGNOSES:  1. Right lower lobe pneumonia.  2. Renal insufficiency, resolved.  3. Warfarin coagulopathy, improved.  4. 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  5. History of ischemic cardiomyopathy with an ejection fraction of 25-      30%.  6. Decompensated congestive heart failure, resolved.  7. History of gout.  8. History of hyperlipidemia.  9. History of hypertension.  10.History of peripheral vascular disease with left carotid      endarterectomy.  11.Past history of alcohol abuse.   DISCHARGE MEDICATIONS:  1. Avelox 400 mg p.o. daily x2 days.  2. Aspirin 81 mg p.o. daily.  3. Folic acid 2 mg p.o. daily.  4. Potassium chloride 20 mEq p.o. daily.  5. Thiamine 100 mg p.o. daily.  6. Vitamin B12 1000 mcg p.o. daily.  7. Digoxin 0.062 mg p.o. daily.  8. Lasix 60 mg p.o. daily.  9. Ativan 0.25 mg p.o. b.i.d.  10.Colchicine 0.6 mg p.o. b.i.d.  11.Amiodarone 200 mg p.o. daily.  12.Metoprolol 75 mg p.o. b.i.d.  13.Omeprazole 20 mg p.o. b.i.d.  14.Coumadin 7.5 mg on Monday and Thursday and 5 mg on Tuesday,      Wednesday, Friday, Saturday and Sunday.  (Patient to hold tonight's      Coumadin dose and resume 5 mg tomorrow, Tuesday and then continue      on his regular schedule beyond that.)   DISCONTINUED MEDICATIONS:  Enalapril 10 mg p.o. daily.  (Enalapril held  during this hospitalization secondary to significant increase in  creatinine.)   CONSULTANTS:  None.   PROCEDURES:  None.   DIAGNOSTIC STUDIES:  1. Portable chest x-ray on September 06, 2008 which showed right lower      lobe airspace disease, most compatible with pneumonia.  2. Renal ultrasound done on th e19th which shows no evidence of      obstruction.  Simple cyst on right kidney.   PRIMARY CARE PHYSICIAN:  Richard A. Alanda Amass, M.D.   ALLERGIES:  LIPITOR, which causes joint pain.   CODE STATUS:  Full code.   CHIEF COMPLAINT:  Shortness of breath x2 days.   HISTORY OF PRESENT ILLNESS:  Please refer to the H&P dictated by Dr.  Orvan Falconer for details of the HPI.   HOSPITAL COURSE:  1. Right lower lobe pneumonia.  The patient was admitted and started      on Rocephin and Azithromycin.  The patient improved clinically,      resolving his need for oxygen.  The patient was then switched over      to oral Avelox and is to complete an additional  2 days to complete      his full course of antibiotics.  The patient has improved      clinically and at this point is not requiring any oxygen and has no      subjective or objective shortness of breath or tachypnea.  2. Renal insufficiency.  The patient did have some decompensated CHF      with an elevated BNP to 942.  The patient received IV diuretics and      had some renal insufficiency, which was felt to be multifactorial      owing to the diuresis in addition to the use of an ACE inhibitor.      The patient is now back on his usual diuretics and his renal      function.  His creatinine is down to 1.44.  The enalapril was held      on this admission and is being held at the time of discharge.  I      suspect that the patient can probably resume the enalapril given      his low ejection fraction.  However, I will defer that to his      primary care physician, who can follow him on a chronic basis.  3. 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      The patient was mostly in atrial fibrillation during his      hospitalization.  The patient  does have a pacemaker placed and is      atrial and ventricularly paced.  The patient did have just two      episodes of tachyarrhythmia which resolved once his metoprolol was      restarted.  4. Warfarin coagulopathy.  The patient was subtherapeutic on admission      to the hospital and Coumadin was titrated.  His Coumadin peaked at      3.3 and today is down to 3.1.  The patient has been instructed to      hold his dose of 7.5 mg of Coumadin tonight and to resume with his      usual Coumadin regimen starting tomorrow, which is 5 mg on Tuesday,      Wednesday, Friday, Saturday and Sunday and 7.5 mg on Thursday and      Mondays.   Otherwise the patient remained stable during his hospitalization. Today  he is afebrile.  Vital signs are stable.  The patient is ambulat6ory  without any need for oxygen.  Lungs are clear to auscultation.  He has a  normal S1, S2.  No murmurs, rubs or gallops noted.  Abdomen is obese,  soft, nontender, nondistended.  No masses, no hepatosplenomegaly.   DIETARY RESTRICTIONS:  The patient should be on a low-sodium, low-  cholesterol diet.   PHYSICAL RESTRICTIONS:  None.   FOLLOWUP:  The patient to follow up with Dr. Alanda Amass in 1 week and to  have his Coumadin checked on Wednesday at [facility].      Altha Harm, MD  Electronically Signed     MAM/MEDQ  D:  09/11/2008  T:  09/11/2008  Job:  [id number]   cc:   Gerlene Burdock A. Alanda Amass, M.D.

## 2011-05-06 NOTE — Consult Note (Signed)
NAMESHAH, INSLEY NO.:  [id number]   MEDICAL RECORD NO.:  [id number]          PATIENT TYPE:  INP   LOCATION:  2021                         FACILITY:  [facility]   PHYSICIAN:  Sheliah Plane, MD    DATE OF BIRTH:  [birth year]   DATE OF CONSULTATION:  05/11/2009  DATE OF DISCHARGE:                                 CONSULTATION   FOLLOWUP CARDIOLOGIST:  Richard A. Alanda Amass, M.D.   PRIMARY CARE PHYSICIAN:  Richard A. Alanda Amass, M.D.   REASON FOR CONSULTATION:  The patient with acute on chronic congestive  heart failure and questionable microstenosis and suitability for aortic  valve replacement.   HISTORY OF PRESENT ILLNESS:  The patient is a 70 year old male who has a  long complex cardiac history.  I originally saw him in 1993 at which  time he was undergoing an angioplasty of the right coronary artery,  failed angioplasty requiring emergency coronary artery bypass grafting  to the right.  I saw him again in 2000 when he presented with  progressive coronary occlusive disease and a large inferior myocardial  infarction and at that time, underwent re-do coronary artery bypass at  age 88 and coronary artery bypass grafting x4 with left internal mammary  to left anterior descending coronary artery, free right internal mammary  to the circumflex, reverse saphenous vein graft to the ramus, and  reverse saphenous vein graft to the diagonal coronary artery.  At that  time, he was noted to have a significant decrease in his left  ventricular ejection fraction being about 30%.  He did well until the  past 2-3 years when he began having increasing episodes of congestive  heart failure.  Had paroxysmal atrial fibrillation with isthmus ablation  BiV defibrillator pacer placed, history of elevated pleural functions  and he has now been in and out of hospital frequently with dyspnea on  exertion and a 2-pillow orthopnea and increase in the size without pedal  edema.   CARDIAC  RISK FACTORS:  The patient has low blood pressure.  No diabetes.  Never a smoker.  Has had no previous stroke but did have carotid artery  disease.   PAST MEDICAL HISTORY:  Significant for ischemic cardiomyopathy, valvular  heart disease with aortic valve area, by catheterization, of 0.88 in  2008.  It is now 2 years later, catheterization shows aortic valve area  of 0.89.  Has a history of paroxysmal atrial fibrillation with isthmus  ablation with history of cerebrovascular disease with left carotid  endarterectomy in October 2003; history of GI bleed, February 2010;  history of elevated LFTs; question cause secondary to alcohol abuse in  the past, has been on amiodarone in the past; history of biventricular  implantable cardioverter-defibrillator; history of gout; history of GI  reflux; history of hypothyroidism; chronic renal insufficiency,  creatinine 1.5.   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   CURRENT MEDICATIONS:  1. Coumadin 5 mg a day.  2. Aspirin 81 mg a day.  3. Folic acid.  4. Potassium.  5. Digoxin.  6. Lasix.  7. Protonix.  8. Enalapril.  9. Colchicine.  10.Vitamin B1.  11.Vitamin B12.  12.Spironolactone.  13.Crestor.  14.Iron sulfate.  15.Amiodarone 200 twice a day.  16.Lorazepam.  17.Nitroglycerin.  18.Coreg 3.125 mg twice a day.   ALLERGIES:  None noted with the exception of, he says, Lipitor causes  gout to flare up.   CARDIAC REVIEW OF SYSTEMS:  Positive for exertional shortness of breath,  orthopnea, and presyncope especially when he stands, he tends to get  lightheaded.  Denies syncope or lower extremity edema, does have history  of palpitation.   GENERAL REVIEW OF SYSTEMS:  Notes that recently he has gained weight,  usually his weight is 184, currently is 204.  He denies fever, chills,  or night sweats.  He has extremely poor dentition with multiple  broken  fractured teeth.  Has had blood in his stool in the past.  Denies  amaurosis or TIAs.  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  Has frequent diarrhea but no blood in the  stool recently.   PHYSICAL EXAMINATION:  VITAL SIGNS:  Blood pressure 90/60, heart rate  58, respiratory rate 17, O2 sat 97% on room air.   Cardiac catheterization films were reviewed.  The patient has severe  inferoapical hypokinesia with ejection fraction approximately 15%.  No  MR.  PA pressure was elevated at 37/13, mean of 22; calculated aortic  valve area 0.89, basically unchanged from 2008.  His previous grafts  were all patent, it appears that the only unprotected area is an 80%  lesion in a small posterolateral branch.   IMPRESSION:  The patient with severe ischemic cardiomyopathy and  progressive heart failure symptoms with aortic valve area of 0.9 and  approximately 15% ejection fraction.  With the patient being a 3-time re-  do and what appears to be severe underlying ischemic cardiomyopathy, not  cardiomyopathy-induced severe aortic stenosis, I have no doubt that he  would be better off with a fully patent aortic valve.  However, I am  unconcerned with his pulmonary hypertension, poor LV function that his  operative course could be very difficult and do not think we will see a  dramatic increase in LV function with aortic valve replacement.  Several  options exist, one is a third-time re-do bypass surgery with aortic  valve replacement that carries a significant mortality, another choice  would be a percutaneous valve placed not on cardiopulmonary bypass as he  does not need any significant coronary artery grafting performed.  The  [employer] percutaneous valve trials are currently closed;  however, the [employer] has allowed the original participating centers to  continue with valve placement on patients who would meet the original  inclusion criteria  for the experimental study.  Close centers that are  on this list include [facility] in [place] and [place] [facility] [place] and  [facility] would be worth investigating, this is a  possible option as it may carry a lower operative risk and still  potentially see some improvement in his symptoms.  He does have  significant dental problems with actively infected teeth.  Dr.  Birdie Sons has seen him and plans to do a dental extraction next week  while he is off Coumadin.  In the meantime, we will investigate these  further options before making a final decision.      Sheliah Plane, MD  Electronically Signed  EG/MEDQ  D:  05/11/2009  T:  05/12/2009  Job:  [id number]   cc:   Gerlene Burdock A. Alanda Amass, M.D.

## 2011-05-06 NOTE — Consult Note (Signed)
[name], [name] NO.:  [id number]   MEDICAL RECORD NO.:  [id number]          PATIENT TYPE:  INP   LOCATION:  2021                         FACILITY:  [facility]   PHYSICIAN:  Charlynne Pander, D.D.S.DATE OF BIRTH:  11-Nov-1941   DATE OF CONSULTATION:  05/11/2009  DATE OF DISCHARGE:                                 CONSULTATION   REFERRING PHYSICIANS:  1. Richard A. Alanda Amass, MD  2. Sheliah Plane, MD   [name] is a 70 year old male, referred by Dr. Susa Griffins  and Dr. Sheliah Plane for dental consultation.  The patient with  severe aortic stenosis with anticipated aortic valve replacement.  The  patient now seen as part of a pre-aortic valve replacement dental  protocol evaluation.   PAST MEDICAL HISTORY:  1. Aortic stenosis with anticipated aortic valve replacement.      a.     Status post cardiac catheterization on May 10, 2009, by Dr.       Yates Decamp, which revealed aortic valve area of 0.89 cm2, ejection       fraction approximately 20-25% in single-vessel coronary artery       disease.  2. Coronary artery disease.      a.     History of previous coronary artery bypass graft procedure       is approximately 1992, redo in 2000, and multiple angioplasty       interventions since then.  The patient also with several stents.       The patient with anticipated single-vessel coronary artery bypass       graft in the future.  3. History of acute-on-chronic systolic/diastolic heart failure,      status post defibrillator ICD implantation.  4. History of ischemic cardiomyopathy.  5. History of atrial flutter/atrial fibrillation.  6. History of hypothyroidism.  7. History of anemia and GI bleed in February 2010.  8. History of gout, currently on colchicine therapy.  9. Peripheral vascular disease.  10.History of elevated liver function tests with unknown etiology at      this time.  11.History of hypertension.  12.Chronic renal  insufficiency.   ALLERGIES/ADVERSE DRUG REACTION:  1. Intolerance to STATINS.  2. LIPITOR causes edema.   MEDICATIONS:  1. Amiodarone 200 mg twice daily.  2. Aspirin 325 mg daily.  3. Coreg 3.125 mg twice daily.  4. Colchicine 0.6 mg twice daily.  5. Vitamin B12 1000 mcg daily.  6. Lanoxin 0.125 mg daily.  7. Enalapril 2.5 mg twice daily.  8. Iron sulfate 325 mg daily.  9. Folic acid 1 mg twice daily.  10.Lasix 40 mg IV daily.  11.Heparin per protocol.  12.Ativan 0.5 mg twice daily.  13.Magnesium oxide 400 mg twice daily.  14.Protonix 40 mg twice daily.  15.K-Dur 20 mEq twice daily.  16.Crestor 5 mg every evening.  17.Spirolactone 25 mg twice daily.  18.Thiamine 100 mg daily.  19.Coumadin per protocol to be discontinued.   SOCIAL HISTORY:  The patient is married, with 2 grown children.  Both  live in [place], [place].  The patient has never smoked.  The  patient with a history of alcoholic abuse, but none for the past 10-12  months by his report.   FAMILY HISTORY:  Mother died at the age of 41 with a history of  Alzheimer disease.  Father died at the age of 28 from a massive heart  attack.   FUNCTIONAL ASSESSMENT:  The patient remains independent for basic ADLs  at this time.   REVIEW OF SYSTEMS:  This is reviewed from the chart for this admission.   DENTAL HISTORY:   CHIEF COMPLAINT:  The patient is a pre-heart valve surgery dental  protocol evaluation.   HISTORY OF PRESENT ILLNESS:  The patient recently identified to have  progressive aortic stenosis, requiring aortic valve replacement.  The  patient is now seen as part of a pre-heart valve surgery dental protocol  to rule out dental infection that may affect the patient's systemic  health and anticipated heart valve surgery as well as to prevent future  complication of infective endocarditis.   The patient currently denies acute toothache, swellings, or abscesses.  The patient was last seen by dentist  approximately 4-5 years ago to have  a tooth pulled.  The patient indicates that he [place] time.  The patient denies having any  partial dentures.  The patient has not had his teeth cleaned for a long  long time.   DENTAL EXAM:  GENERAL:  The patient is a well-developed slightly built  male, in no acute distress.  VITAL SIGNS:  Blood pressure is 97/63, pulse rate is 58, respirations  17, and temperature 97.0.  HEAD AND NECK:  There is no palpable lymphadenopathy.  The patient  denies acute TMJ symptoms.   INTRAORAL EXAM:  The patient with normal saliva.  There is no evidence  of abscess formation in the mouth at this time.   DENTITION:  There are multiple missing teeth.  There are retianed root  segemnts. I will need an orthopantogram to rule out the exact numbers  missing/present.  DENTAL CARIES: Multiple dental caries as per dental charting form.   PERIODONTAL:  The patient with chronic periodontitis with plaque and  calculus accumulations.   ENDODONTIC:  The patient currently denies acute pulpitis symptoms.  We  will need an orthopantogram to rule out periapical pathology.   CROWN OR BRIDGE:  There are no apparent crown restorations noted.   PROSTHODONTIC:  The patient denies presence of partial dentures.   OCCLUSION:  The patient with a poor occlusal scheme, but a stable  occlusion at this time.   RADIOGRAPHIC INTERPRETATION:  Orthopantogram was ordered today.   ASSESSMENT:  1. History of oral neglect.  2. Chronic periodontitis with bone loss.  3. Generalized gingival recession.  4. Incipient tooth mobility.  5. Dental caries.  6. Multiple retained root segments.  7. Multiple missing teeth.  8. Poor occlusal scheme, but a stable occlusion.  9. No history of partial dentures.  10.Coumadin and heparin therapy with risk for bleeding with invasive      dental procedures.  11.Risk for infective endocarditis due to poor  dentition.   PLAN/RECOMMENDATIONS:  I discussed the risks, benefits, and  complications of various treatment option with the patient in  relationship to his medical and dental conditions and anticipated heart  valve surgery, and the risk for infective endocarditis.  We discussed  various treatment options to include no treatment, total and subtotal  extractions with alveoloplasty, pre-prosthetic surgery as indicated,  periodontal therapy, dental restorations, root canal therapy, crown or  bridge therapy, implant therapy, replacing missing teeth as indicated  after adequate healing.  The patient currently wishes to proceed with  extraction of selective teeth with alveoloplasty and gross debridement  of remaining dentition in the operating room as indicated.  The patient  is aware of the significant risks with any invasive dental procedures  due to his overall cardiovascular compromise up to and including death.  Plan will be to tentatively schedule the surgery for Tuesday, May 25 at  9:30 in the morning in the operating room with monitored anesthesia  care.  We will plan on confirming this procedure with Dr. Tyrone Sage in  Cardiology Team as indicated early Monday of next week.  The patient  will have Coumadin withheld at this time to allow heparin therapy and  the ability to proceed with dental extractions on Tuesday.      Charlynne Pander, D.D.S.  Electronically Signed     RFK/MEDQ  D:  05/11/2009  T:  05/12/2009  Job:  [id number]   cc:   Sheliah Plane, MD  Cristy Hilts. Jacinto Halim, MD  Richard A. Alanda Amass, M.D.

## 2011-05-06 NOTE — Discharge Summary (Signed)
NAMETRAYE, BATES             ACCOUNT NO.:  [id number]   MEDICAL RECORD NO.:  [id number]          PATIENT TYPE:  INP   LOCATION:  3733                         FACILITY:  [facility]   PHYSICIAN:  Richard A. Alanda Amass, M.D.DATE OF BIRTH:  December 11, 1941   DATE OF ADMISSION:  09/09/2007  DATE OF DISCHARGE:  09/16/2007                               DISCHARGE SUMMARY   ADDENDUM:  The patient was kept an extra night in the hospital because  he started having some paroxysmal atrial flutter with some PVCs and then  one episode of possible nonsustained ventricular tachycardia.  We had  increased his Lopressor from 50 mg twice a day to 75 mg twice a day and  he had one breakthrough episode after that, so we have increased his  amiodarone to 400 mg every 8 hours.   DISCHARGE MEDICATIONS:  1. Crestor 10 mg daily.  2. Lanoxin 0.125 mg daily.  3. Lasix 20 mg daily, which was decreased from his 40 mg.  4. Vasotec 2.5 mg daily, the same.  5. Folic acid 1 mg daily.  6. Protonix 40 mg daily or omeprazole 20 mg daily.  He has omeprazole      at home.  7. Allopurinol 300 mg daily.  8. Colchicine 0.6 mg twice a day.  9. Aspirin 81 mg daily.  10.Amiodarone 200 mg tablets, two tablets every 8 hours for 5 days,      beginning Tuesday to take 200 mg daily only.  11.K-Dur 20 mEq daily.  12.Lopressor 50 mg 1-1/2 tablets twice a day.  13.Coumadin 5 mg half a tablet today and tomorrow.  14.Follow up with the Coumadin clinic on the second floor of      [facility] at 2:30 p.m. on September 17, 2007.  15.Stop atenolol.  16.Stop Plavix.   These were reviewed with the patient and they will follow up with Dr.  Alanda Amass September 28, 2007, at 11:30.  The patient did not have a dental  consult in the hospital and it was felt through EP they would prefer to  do the bi-V ICD first and dental episodes at a later date.  Also, from  the note in the chart, I believe Dr. Graciela Husbands did not think he was a  candidate for redo flutter.      [name]. [place], N.P.      Richard A. Alanda Amass, M.D.  Electronically Signed    LRI/MEDQ  D:  09/16/2007  T:  09/16/2007  Job:  [id number]   cc:   Melissa L. [name], MD

## 2011-05-06 NOTE — Consult Note (Signed)
[name], [name] NO.:  [id number]   MEDICAL RECORD NO.:  [id number]          PATIENT TYPE:  INP   LOCATION:  3737                         FACILITY:  [facility]   PHYSICIAN:  Hillis Range, MD       DATE OF BIRTH:  07/29/1941   DATE OF CONSULTATION:  DATE OF DISCHARGE:                                 CONSULTATION   REQUESTING PHYSICIAN:  Richard A. Alanda Amass, MD   REASON FOR CONSULTATION:  Atrial fibrillation and atypical atrial  flutter.   HISTORY OF PRESENT ILLNESS:  [name] is a pleasant 70 year old  gentleman with a history of coronary artery disease, ischemic  cardiomyopathy (ejection fraction 35% to 45%), and atrial fibrillation  who was admitted on October 21, 2008, with symptoms of heart racing  and shortness of breath.  The patient was found to have rapid  ventricular rate with the heart rate in the 120s to 130s secondary to  atrial fibrillation and left atrial flutter.  He was continued on his  home regimen of dronedarone and metoprolol and subsequently converted to  sinus rhythm.  He has been well since that time.  He reports similar  episodes of episodic heart racing approximately 1 time per week with  associated shortness of breath and fatigue.  These episodes typically  occur when he is stressful with moderate activities.  The patient has  made efforts to discontinue alcohol consumption, but has not found it  helpful.  He was previously treated with amiodarone; however, this  medication was discontinued due to increased liver function test.  Since  I switched him to dronedarone several weeks ago, he continues to have  episodic heart racing.  He denies significant chest discomfort,  presyncope, or syncope.  He is also treated chronically with metoprolol  and digoxin.  Presently, he reports feeling at his baseline state of  health and is otherwise without complaint.   PAST MEDICAL HISTORY:  1. Coronary artery disease, status post CABG in 1993  with redo CABG in      2000.  2. Ischemic cardiomyopathy (ejection fraction 35% to 45%).  3. Mild aortic stenosis.  4. New York Heart Association Class II/III heart failure symptoms      chronically.  5. Status post biventricular ICD with a Medtronic maximal device      implanted by Dr. Lewayne Bunting on September 14, 2007.  6. Hypertension.  7. Hyperlipidemia.  8. Peripheral vascular disease.  9. Atrial fibrillation (as above).  10.Atrial flutter, status post cavotricuspid ablation by Dr. Severiano Gilbert on      January 23, 2005.  11.Mild renal insufficiency.  12.Abnormal liver function test presumed secondary to alcohol.  13.Gout.  14.Chronic anticoagulation with Coumadin.  15.History of heavy alcohol consumption.  16.Cerebrovascular disease, status post left carotid endarterectomy.   ALLERGIES:  LIPITOR intolerance.   CURRENT MEDICATIONS:  1. Ativan 0.25 mg b.i.d.  2. Enalapril 10 mg daily.  3. Aspirin 81 mg daily.  4. Folate 2 mg daily.  5. Toprol-XL 75 mg daily.  6. Coumadin 5 mg daily.  7. Protonix 80 mg daily.  8. Dronedarone 400  mg b.i.d.  9. Diltiazem 30 mg q.6 h.  10.Multivitamin daily.  11.Lasix 80 mg IV p.r.n.  12.Potassium chloride supplementation as needed.  13.Heparin drip.   SOCIAL HISTORY:  The patient denies tobacco use.  He has a history of  heavy alcohol use.  He reports quitting approximately 3 months ago.  He  denies drug use.   FAMILY HISTORY:  The patient's father died at age 32 of Alzheimer  disease.  He had a history of coronary artery disease.   REVIEW OF SYSTEMS:  All systems were reviewed and negative except as  outlined in the HPI above.   PHYSICAL EXAMINATION:  VITAL SIGNS:  Blood pressure 101/67, heart rate  71, respirations 18, temperature 97.8, and sats 96% on room air. Weight  89 kilograms.  GENERAL:  The patient is a well-appearing male in no acute distress.  He  is alert, and oriented x3.  HEENT:  Normocephalic and atraumatic.  Sclerae  clear.  Conjunctivae  pink.  Oropharynx clear.  Neck:  Supple.  No JVD, lymphadenopathy, or bruits.  LUNGS:  Few bibasilar rales otherwise clear.  HEART:  Regular rate and rhythm.  Systolic ejection murmur is 3/6 along  the left sternal border.  GI:  Soft, nontender, and nondistended.  Positive bowel sounds.  EXTREMITIES:  No clubbing, cyanosis, or edema.  NEUROLOGIC:  Strength and sensation are intact.  SKIN:  No ecchymosis or lacerations.  The patient has multiple well-  healed excoriations along his forearms bilaterally.  PSYCH:  Depressed mood, flat affect.   LABORATORY DATA:  Sodium 140, potassium 3.6, creatinine 1.22, AST 71,  ALT 47, and BNP 335.  Hematocrit 35, platelets 240, INR 1.7, and TSH  1.64.   An EKG from October 22, 2008, at 156 reveals a left bundle-branch block  with an underlying rhythm of atrial tachycardia which appears to arise  from the left atrium.  The left ventricular rate is 120 beats per  minute.   The patient's predominant rhythm is sinus rhythm with a ventricular  paced EKG.   IMPRESSION:  [name] is a pleasant 70 year old gentleman with a  history of coronary artery disease, ischemic cardiomyopathy, New York  Heart Association Class II/III heart failure symptoms chronically, and  atrial fibrillation who was admitted with symptomatic atrial  fibrillation and atypical atrial tachycardia.  Review of the patient's  EKGs revealed both atrial fibrillation and atrial tachycardia which I  suspect arises from the left atrium.  The patient reports symptoms of  palpitations and shortness of breath which occur near weekly with this.  I therefore think that the patient would benefit from attempts at rhythm  control.  The patient also reports mildly increasing symptoms of heart  failure over the past few months since having difficulty with  diaphragmatic stimulation of his left ventricular lead.   PLAN:  Therapeutic strategies for atypical atrial  tachycardia and atrial  fibrillation were discussed in detail with the patient today.  Therapeutic strategies including both the medicine and catheter-based  therapies were discussed.  Risks, benefits, and alternatives, EP study  and radiofrequency ablation, atrial fibrillation and left-sided atrial  tachycardia were discussed in detail with the patient today.  These  risks include, but are not limited to bleeding, vascular damage,  pericardial effusion, pulmonary vein stenosis, esophageal injury, and  stroke.  The patient understand these risks.  He wishes to further  contemplate this option and follow up with me to discuss it further in  clinic.  The  patient is currently subtherapeutic on Coumadin.  We will  need to assure an INR between 2 and 3 for at least 3-4 weeks before  proceeding to catheter ablation.  I think at the present time, we could  either continue the patient on dronedarone at 400 mg twice daily or  consider switching the patient to sotalol.  I think it would certainly  be reasonable to continue dronedarone, however, should the patient have  any worsening of his heart failure then we probably need to discontinue  this medication.  I will ask Medtronic to interrogate the patient's ICD  today, and we will see if there are any programming changes which may be  made to optimize his biventricular pacing.  The patient will follow up  with me in clinic in 2 weeks to further discuss catheter ablation.  I  appreciate the opportunity to participate in his care.      Hillis Range, MD  Electronically Signed     JA/MEDQ  D:  10/23/2008  T:  10/24/2008  Job:  [id number]   cc:   Gerlene Burdock A. Alanda Amass, M.D.

## 2011-05-06 NOTE — Discharge Summary (Signed)
NAMEFREDICK, SCHLOSSER NO.:  [id number]   MEDICAL RECORD NO.:  [id number]          PATIENT TYPE:  INP   LOCATION:  2035                         FACILITY:  [facility]   PHYSICIAN:  Richard A. Alanda Amass, M.D.DATE OF BIRTH:  January 03, 1941   DATE OF ADMISSION:  11/24/2007  DATE OF DISCHARGE:  11/28/2007                               DISCHARGE SUMMARY   Mr. Spofford is a patient of Dr. Rocco Serene with known coronary  disease with a history of CABG in 1992, repeat in 2000. He has a history  of PA flutter oblation, cardiomyopathy with a BiV ICD placed September 14, 2007. He has alcohol abuse problems and he was seen in the office by  Dr. Susa Griffins complaining about epigastric pain and CHF symptoms  thus he was admitted to the hospital for further evaluation. It is  always difficult to tell exactly what medications he is on. He was  admitted, his BNP was mildly elevated at 800 and he was diuresed with  improvement of his symptoms.  His Lasix was actually held 2 days prior  to his discharge. His epigastric pain went away and his labs did not  show any pancreatitis. On November 28, 2007, he was seen by Dr. Kem Boroughs and considered stable for discharge home. His blood pressure  was 118/65, pulse was 59, respirations 20, temperature 97, O2 sats 98%.  His labs on the day of discharge are sodium 139, potassium 3.8, BUN of  26, creatinine 1.43, glucose 107. Hemoglobin 13.2, hematocrit 38.9,  platelets 240, WBCs 7.3.  His INR is 1.9, AST was 53 which was down from  admission, ALT was 59 which was also down. His BNP was 178. Uric acid  was 6.3.  His alcohol level on the day of admission was less than 5. Dig  level was less than 0.2.  His AST on admission was 80 and his ALT was  72.  Amylase was 82 and lipase was 32. His chest x-ray showed some  atelectasis.   DISCHARGE DIAGNOSIS:  1. Acute on chronic systolic congestive heart failure.  2. Ischemic cardiomyopathy  with an EF of 25-35%.  3. Known coronary disease with redo CABG in 2000, initial CABG in      1992, recath August 2008.  He had a patent graft, some minor      narrowing, 40-50% in his SVG to his right, patent tandem stents in      his PDA.  4. Mild valvular disease with mild aortic valvular stenosis with a      mean gradient 21 mg of mercury.  5. History of PA flutter with history of ablation and then recurrence      now on amiodarone.  6. History of IV ICD implanted September 14, 2007.  7. Hypertension.  8. Alcohol abuse.  9. Anticoagulation on Coumadin.  His INR at discharge was 1.9, he was      given 7.5 mg a day but the day he was discharged his Coumadin was      increased to 6 mg a day as recommended by pharmacy. He will  follow      up at the Coumadin clinic the week he is discharged.   DISCHARGE MEDICATIONS:  1. Enalapril 10 mg every day.  2. Warfarin 6 mg every day.  3. Amiodarone 200 mg twice a day.  4. Furosemide 40 mg a half every day.  5. Klor-Con 20 mEq a day.  6. Allopurinol 300 mg a day.  7. Lanoxin 0.125 mg half every day.  8. Omeprazole 20 mg twice a day.  9. Lorazepam 0.5 mg half twice a day.  10.Metoprolol 50 mg 1-1/2 twice a day.  11.Colchicine 0.6 mg twice a day.  12.Folic acid 1 mg a day.  13.Nitroglycerin p.r.n. as needed.  14.Crestor 10 mg a day.      Lezlie Octave, N.P.      Richard A. Alanda Amass, M.D.  Electronically Signed    BB/MEDQ  D:  11/28/2007  T:  11/29/2007  Job:  [id number]

## 2011-05-06 NOTE — Discharge Summary (Signed)
[name], [name]             ACCOUNT NO.:  [id number]   MEDICAL RECORD NO.:  [id number]          PATIENT TYPE:  INP   LOCATION:  3733                         FACILITY:  [facility]   PHYSICIAN:  [name], Donald BowmanDATE OF BIRTH:  14-Jun-1941   DATE OF ADMISSION:  09/09/2007  DATE OF DISCHARGE:  09/15/2007                               DISCHARGE SUMMARY   DISCHARGE DIAGNOSES:  1. 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  The patient underwent BI-V ICD implantation by Dr.      [name].  2. Known coronary artery disease status post CABG.  3. Redo CABG in 2002, first CABG in 1993, both done by Dr. Jaynie Collins.  4. Left ventricular dysfunction with EF 25-35% - ischemic      cardiomyopathy.  5. Moderate aortic valvular stenosis.  6. Peripheral vascular disease status post carotid endarterectomy in      2003.  7. 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  8. Hyperlipidemia.  9. Chronic alcohol abuse.  10.Gastroesophageal reflux disease.  11.Gastroparesis.  12.Systemic hypertension.  13.Gout.  14.Hypertriglyceridemia and hyperlipidemia - treated.  15.Dental caries and poor dental hygiene requiring evaluation.   HISTORY OF PRESENT ILLNESS:  This is a 70 year old gentleman patient of  Dr. Alanda [name] who was seen in our office on September 09, 2007, with  complaints of substernal burning which was nonexertional and some  atypical features.  He did not have any typical anginal symptoms.  Did  not require any nitroglycerin intake.  The patient has been on Coumadin  in the past for a flutter, but recently he was on aspirin and Plavix.  EKG in the office showed atrial flutter with variable ventricular  response and some fusion beats, PVCs, intraventricular conduction  delays.  The decision was made to bring him to  the hospital for EP  consult and possible flutter ablation.   On the day of his admission, he was seen by Dr. [name] who decided that  at this point the patient does not need to undergo flutter ablation,  could be treated with antiarrhythmics such as amiodarone and needs to be  on anticoagulation therapy.  While in the hospital, the patient  developed an acute attack of gout and was started on Colchicine in  addition to Zyloprim.   Dr. Jenne Campus saw the patient on September 12, 2007, and asked EP service  to re-evaluate the patient for possible Bi-V ICD implant.  Dr. [name]  again saw the patient and agreed with that plan, so eventually on  September 14, 2007, the patient underwent a Medtronic device by the ICD  implant performed by Dr. [name].  The next morning, his device was  interrogated by pacer wrap, and all parameters were within normal  limits.   Dr. Tresa Endo saw the patient the next day after ICD implant and considered  him stable for DC home.  Coumadin was started while  the patient was in  the hospital and on the same day when ICD was implanted.   HOSPITAL LABORATORIES:  Hemoglobin 14.4, hematocrit 41.6, white blood  cell count 10.2, platelet count 267, potassium 3.5, sodium 140, CO2 26,  chloride 104, BUN 12, creatinine 1.05, glucose 124.  His INR the day of  discharge was 1.0, uric acid 6.9, BNP 311 on the 20th which improved  from the previous one of 487.   TSH was 3.353.   DISCHARGE INSTRUCTIONS:  The patient was advised to stay on low fat, low-  cholesterol diet and low-salt diet.  He was instructed not to wet  incision site and avoid overhead reaching up until he is seen by Dr.  [name] in the office.   DISCHARGE MEDICATIONS:  1. Crestor 10 mg daily.  2. Lanoxin 0.125 mg daily.  3. Lasix 20 mg daily.  4. Vasotec 2.5 mg daily.  5. Folic acid 1 mg daily.  6. Protonix 40 mg b.i.d.  7. Allopurinol 300 mg daily.  8. Colchicine 0.6 mg b.i.d.  9. Aspirin 81 mg daily.   10.Amiodarone 400 mg daily.  11.K-Dur 20 mEq daily.  12.Lopressor 50 mg b.i.d.  13.Coumadin 5 mg half of a pill today and tomorrow.   The patient will have blood work done.  Will check his pro time in our  office on Friday September 26 at 8:50 a.m. and he will be instructed on  the dose of Coumadin.  Dr. Alanda [name] will see him on September 28, 2007, at  11:30, and Dr. Lubertha Basque office will contact the patient to schedule  follow-up appointment.      [name], P.A.      [name], M.D.  Electronically Signed    MK/MEDQ  D:  09/15/2007  T:  09/16/2007  Job:  [id number]   cc:   Melissa L. [name], MD  [name], M.D.

## 2011-05-09 NOTE — H&P (Signed)
NAMEKELLON, CHALK NO.:  [id number]   MEDICAL RECORD NO.:  [id number]          PATIENT TYPE:  INP   LOCATION:  4711                         FACILITY:  [facility]   PHYSICIAN:  Ritta Slot, MD     DATE OF BIRTH:  04-04-1941   DATE OF ADMISSION:  04/17/2009  DATE OF DISCHARGE:  04/23/2009                              HISTORY & PHYSICAL   [name] is a very pleasant 70 year old gentleman who once 2  weeks ago underwent aortic valve replacement up at [facility]  except I do not have the exact details of the nature of the operation.  He is coming now with a 1-2 week history of progressive shortness of  breath that has been relieved with some slight improvement with increase  of his dose of diuretic, but really not completely.  He remains short of  breath today.  He is here in the office for further evaluation.   PAST MEDICAL HISTORY:  1. Chronic systolic congestive heart failure.  2. Ischemic cardiomyopathy.  Prior EF of 35-45%.  3. Valvular heart disease with severe aortic stenosis most recently      from TEE on May 17, 2009.  4. Coronary artery bypass grafting in 1992 with a redo in 2000.  5. Multiple catheterizations and stenting including December 2005,      2006, February 2007, August 2008, and most recently showing all      grafts were patent.  6. PVD with left carotid endarterectomy in February 2003.  7. History of intestinal bleed February 13, 2009.  8. History of elevated LFTs probably secondary to alcohol abuse and      also amiodarone at the time.  9. History of BiV ICD implantation by Medtronic device September 14, 2007 with a Medtronic model [id number], serial #[id number].  10.History of gout.  11.History of hypertension.  12.History of nonsustained VT.  13.Chronic renal insufficiency.   CURRENT MEDICATIONS:  1. Percocet 325/5 mg p.r.n.  2. Amiodarone 200 mg b.i.d.  3. Coreg 3.125 mg b.i.d.  4. Digoxin 0.125 mg daily.  5.  Docusate 100 mg q.12.  6. Iron sulfate 325 mg daily.  7. Folic acid b.i.d.  8. Lasix 40 mg daily.  9. Lorazepam 0.5 mg b.i.d.  10.Magnesium oxide 400 mg t.i.d.  11.Nitroglycerin.  12.Protonix.  13.Potassium.   SOCIAL HISTORY:  Married, 2 children, 4 grandchildren.  Does not  exercise.  Smokes and chews 1 pack a day.  Does not drink alcohol.   REVIEW OF SYSTEMS:  Shortness of breath, PND, orthopnea, but no chest  pain.   PHYSICAL EXAMINATION:  Well-looking individual in no acute distress.  He is 6 feet 1 inch, weighs 193 pounds, BMI 25, blood pressure 124/80,  heart rate 100, ventricularly paced.  HEENT:  Head is atraumatic, normocephalic.  NECK:  Supple, full range of movement.  Mild jugular venous distention  to about 3 cm.  No carotid bruits.  CARDIOVASCULAR:  No heaves or thrills.  Heart sounds 1 and 2 are heard  with 3/6 soft ejection systolic murmur with also pericardial friction  rub.  LUNGS: Godd expansion with decreased air entry on the right lung.  His  percussion note is dull on the right lung.  He has decreased air entry  in the right lung was suggestive of a pleural effusion on the right  side.  He has good air entry on the left.  ABDOMEN:  Soft, nontender, no hepatosplenomegaly.  Bowel sounds present.  No AAA noted.  No renal bruits heard.  EXTREMITIES:  2+ pedal pulses, 1+ edema bilaterally.   ECG shows an A-paced and intermittently V-paced rhythm.  Interrogation  of his BiV ICD shows he has a Maximo Medtronic, serial [id number],  atrial impedance normal, P waves 3.0, threshold 1.00 at 0.3, RV  impedance 384 ohm, R waves 12.4, threshold 1.0 at 0.3, defib impedance  76 ohm, SV impedance 47 ohm.  Threshold  in the LV lead 1.00 at 0.8.  Underlying rhythm sinus brady at 41 beats a minute.  He has had 1800  mode switches from July 23 up until now.  He has had 2 episodes of VT on  July 28.  We have reprogrammed his RV lead to 1.0V .  He has been A-  paced, V-paced  61% of the time, R-paced 100% of the time.   IMPRESSION/PLAN:  A very pleasant 70 year old gentleman who is status  post aortic valve replacement at 1-2 weeks ago who now presents for  shortness of breath.   PLAN:  Will go ahead and admit him directly to a tele bed if we can.  Will get a chest x-ray, 2D echo, CMP, CBC.  He may well need a  thoracentesis for his right pleural effusion.  He may also need  nonsteroidals, although he has a history of acute gastritis.  I will  leave this up to the discretion of Dr. Alanda Amass.  I will keep you  apprised of his progress.  He will be admitted directly to hospital.      Ritta Slot, MD  Electronically Signed     HS/MEDQ  D:  07/23/2009  T:  07/23/2009  Job:  [id number]

## 2011-05-09 NOTE — H&P (Signed)
NAME:  [name], [name] NO.:  [id number]   MEDICAL RECORD NO.:  [id number]          PATIENT TYPE:  INP   LOCATION:  1823                         FACILITY:  [facility]   PHYSICIAN:  Della Goo, M.D. DATE OF BIRTH:  1941/01/24   DATE OF ADMISSION:  04/04/2006  DATE OF DISCHARGE:                                HISTORY & PHYSICAL   CHIEF COMPLAINT:  Syncopal episode.   HISTORY OF PRESENT ILLNESS:  A 70 year old male with a history of severe  atherosclerotic cardiovascular disease who was seen in the emergency  department  at [facility] secondary to epistaxis, that started in  the a.m. at approximately 11:00 that would not stop. The patient is on  Coumadin therapy of 7.5 mg p.o. daily. In the emergency department he was  evaluated and treated, the nose bleed stopped, but prior to discharge he  suffered a syncopal episode which was witnessed, and he was referred for  admission. The patient does report having similar episode, but this one  seemed similar to the time when he had a carotid blockage on the left and  underwent a left carotid endarterectomy. The patient denies any symptoms of  nausea or vomiting with this episode. However, he did report being dizzy,  having chest discomfort and flushing of the face and neck. He denies having  any visual changes or diplopia.  Of note the patient is a patient of  cardiologist, Dr. Alanda [name].   PAST MEDICAL HISTORY:  1.  Coronary artery disease, status post coronary artery bypass grafting      times two, also with PTCA with stent placement.  2.  Hypertension.  3.  Hyperlipidemia.  4.  ASCVD.  5.  Status post carotid endarterectomy on the left.   MEDICATIONS:  1.  Coumadin 7.5 mg one p.o. daily.  2.  Folic acid 1 mg one p.o. daily.  3.  Aspirin 81 mg one p.o. daily.  4.  Folic acid 1 mg p.o. daily.  5.  Kay Ciel 10 mEq one p.o. daily.  6.  Digoxin 0.125 mg one p.o. daily.  7.  Crestor 10 mg one p.o. daily.  8.   Atenolol 50 mg one p.o. daily.   ALLERGIES:  No known drug allergies. Intolerance to Lipitor which caused  severe myalgias.   SOCIAL HISTORY:  The patient is married, has a tobacco usage of oral  tobacco/chews.   PHYSICAL EXAMINATION FINDINGS:  GENERAL: A pleasant 70 year old Caucasian  male in no acute distress, currently awake and alert.  HEENT: Normocephalic and atraumatic. Pupils equal, round, and reactive to  light. Extraocular movements are intact.  Tympanic membranes are clear  bilaterally. Oropharynx is clear. No exudates.  NECK: Supple. There is full range of motion. There is no jugular venous  distention. There is a visible old well healed oblique scar at the left  anterior neck consistent with a carotid endarterectomy.  CARDIOVASCULAR: Regular rate and rhythm. No murmurs, rubs, or gallops.  LUNGS: Clear to auscultation bilaterally without rales, rhonchi, or wheezes.  ABDOMEN: Positive bowel sounds, soft, nontender. There is no  hepatosplenomegaly.  EXTREMITIES: No clubbing, cyanosis, or  edema.  CHEST WALL: There is a midsternal scar which is well healed.  NEUROLOGIC: Alert and oriented times three with nonfocal findings.   LABORATORY STUDIES:  Hemoglobin 15.6, hematocrit 46.0, sodium 138, potassium  3.8, chloride 111, BUN 20, creatinine 0.8, and glucose 98. PT 26.6 with an  INR of 2.4.  Cardiac profile #1 negative. Troponin less than 0.05.   ASSESSMENT:  Syncopal episode.   PLAN:  The patient has been placed on telemetry, will be admitted to rule  out a cardiovascular event. Cardiac enzymes are ordered times three. A  carotid ultrasound has been ordered as well as a.m. labs. He will continue  his regular medications. He will be placed on GI prophylaxis and currently  his Coumadin level is therapeutic.           ______________________________  Della Goo, M.D.     HJ/MEDQ  D:  04/04/2006  T:  04/04/2006  Job:  [id number]

## 2011-05-09 NOTE — Cardiovascular Report (Signed)
NAMESUHAAS, AGENA NO.:  [id number]   MEDICAL RECORD NO.:  [id number]          PATIENT TYPE:  INP   LOCATION:  4708                         FACILITY:  [facility]   PHYSICIAN:  Nanetta Batty, M.D.   DATE OF BIRTH:  [birth year]   DATE OF PROCEDURE:  11/28/2005  DATE OF DISCHARGE:                              CARDIAC CATHETERIZATION   [name] is a 70 year old male patient of Richard A. Alanda Amass, M.D.  He  has a history of CAD, status post coronary artery bypass grafting.  He was  catheterized a year ago and had PCI and stenting of his PDA through his vein  graft.  He has had recurrent symptoms of chest burning.  He presents now for  diagnostic coronary angiography.   PROCEDURE DESCRIPTION:  The patient was brought to the second floor [facility] cardiac catheterization lab in the postabsorptive state.  He was  premedicated with p.o. Valium.  His right groin was prepped and shaved in  the usual sterile fashion.  Xylocaine 1% was used for local anesthesia.  A 6  French sheath was inserted into the right femoral artery using standard  Seldinger technique.  A 6 French right and left Judkins diagnostic catheter  as well as a 6 French pigtail catheter were used for selective coronary  angiography, left ventriculography, selective vein graft angiography,  selective IMA angiography, and distal abdominal aortography.  Selective  renal arteriography was also performed.   HEMODYNAMIC DATA:  1.  Aortic systolic pressure 161, diastolic pressure 80.  2.  Left ventricular systolic pressure 161, end-diastolic pressure 15.   SELECTIVE CORONARY ANGIOGRAPHY:  1.  Left main:  Normal.  2.  LAD:  The LAD was occluded ostially.  3.  Left circumflex:  The circumflex had a 70% stenosis in the AV groove and      was occluded after that.  4.  Ramus intermedius branch:  Occluded at its ostium.  5.  Right coronary artery:  Occluded proximally.  6.  Vein graft to the PDA:  Widely  patent with patent stents in the PDA.  7.  Vein graft to the diagonal:  Widely patent.  8.  LIMA to the LAD:  Widely patent.  9.  Sequential vein graft to the OD and OM:  Widely patent.   LEFT VENTRICULOGRAPHY:  RAO left ventriculogram was performed using 25 mL of  Visipaque dye at 12 mL/sec.  The overall LVEF was estimated at approximately  45% without focal wall motion abnormalities.   DISTAL ABDOMINAL AORTOGRAPHY:  Performed using 20 mL of Visipaque dye at 20  mL/sec.  There appeared to be a left renal artery stenosis.  Infrarenal  abdominal aorta and iliac bifurcation appear free of significant  atherosclerotic changes.   Suggestive right and left renal angiography suggested 60-70% ostial/proximal  left renal artery stenosis.   IMPRESSION:  [name] has widely patent grafts and patent stents in his  posterior descending artery with mild decrease in left ventricular function.  I  do not think he has an ischemic nidus/etiology to his chest pain.  Rather, I feel his pain  most likely is related to reflux.   An ACT was measured and the sheath was removed.  Pressure was held on the  groin to achieve hemostasis.  The patient left the lab in stable condition.      Nanetta Batty, M.D.  Electronically Signed     JB/MEDQ  D:  11/28/2005  T:  11/29/2005  Job:  [id number]   cc:   Second Floor Cardiac Catheterization Lab

## 2011-05-09 NOTE — Op Note (Signed)
NAME:  [name], [name]                       ACCOUNT NO.:  [id number]   MEDICAL RECORD NO.:  [id number]                   PATIENT TYPE:  INP   LOCATION:  3314                                 FACILITY:  [facility]   PHYSICIAN:  Lorre Munroe., M.D.            DATE OF BIRTH:  [birth year]   DATE OF PROCEDURE:  12/14/2002  DATE OF DISCHARGE:                                 OPERATIVE REPORT   PREOPERATIVE DIAGNOSIS:  Severe left carotid bifurcation stenosis.   POSTOPERATIVE DIAGNOSIS:  Severe left carotid bifurcation stenosis.   OPERATION PERFORMED:  Left carotid endarterectomy and patch graft  angioplasty.   SURGEON:  Lebron Conners, M.D.   ASSISTANT:  Donnie Coffin. Samuella Cota, M.D.   ANESTHESIA:  General.   DESCRIPTION OF PROCEDURE:  After the patient was monitored with heart  monitor and arterial monitor and had induction of general anesthesia and  routine preparation and draping of the left side of the neck, I made a 7 to  8 cm mid neck incision paralleling the anterior border of the  sternocleidomastoid muscle.  I dissected down through subcutaneous tissue  and platysma getting  hemostasis with the cautery.  I then found and ligated  the common facial vein in continuity and another sizable vein crossing to  the internal jugular.  I then dissected directly to the carotid pulse and  dissected out and controlled the common carotid, internal carotid artery,  external carotid artery, and superior thyroid vessels.  I took care to avoid  injury of the hypoglossal nerve and the vagus nerve.  The patient received  5000 units of heparin intravenously and after it had had adequate time to  circulate, I tightened the controls on the vessels and clamped the common  carotid, then made a longitudinal arteriotomy from the distal common carotid  up into the internal carotid to the end of the hard stenotic plaque.  The  plaque was found to be extremely degenerated and very stenotic as  demonstrated by  the arteriogram.  There was no obvious fresh blood clot  present in the vessel.  I then put in a 10 [place] straight shunt between the  common carotid and the internal carotid controlling the shunt with the  vessel loops.  There was good back flow from the internal carotid.  Initially, there was very little back flow from the external carotid as  expected because the vessel was shown to be occluded on the angiogram.  I  then worked to remove the plaque from the vessel beginning in the common  carotid and dissecting the plane between the healthy adventitia and media of  the vessel removing the plaque and diseased intima.  I divided the plaque  just distal to the external carotid artery and intussuscepted plaque out of  the proximal part of the external carotid artery. That then allowed back  flow from the vessel but I could not pass the olive tip injector  up into the  vessel to fill it with heparin.  I retightened the control.  I then took the  endarterectomy up the internal carotid until the plaque thinned out and I  cut it transversely where it thinned out into normal thickness intima.  I  irrigated and saw no tendency for dissection to occur but I sutured the  plaque down with three sutures of 6-0 Prolene through-and-through the  plaque, tied outside the blood vessel.  I then found one very weak appearing  area on the posterior aspect of the distal common carotid and sutured it  with three sutures of 6-0 Prolene to strengthen the vessel and smooth the  posterior aspect.  I closed the arteriotomy by suturing down each side with  6-0 Prolene going from internal carotid back into common carotid, sewing  around a Hemashield Finesse  patch.  I did not feel that vessel was stenosed  at all.  When a few sutures remained to be placed, the shunt was removed and  blood flow was checked from all vessels.  There was good back flow from the  external and internal carotids and good forward flow.  I  irrigated the  vessel out to remove any potential clots and then completed the suture line  and allowed flow to go first into the external carotid and then the internal  carotid and there were good pulses in both vessels and excellent flow with  Doppler in both vessels.  I then had to patch one small flute and hemostasis  was good.  Nevertheless, I put a piece of Surgicel over the arterial repair  and completed the operation by closing the wound in two layers of 3-0 Vicryl  in the deep and superficial fascia and an intracuticular 4-0 Vicryl  reinforced by Steri-Strips.  The patient was stable through the procedure  and awoke without neurological deficit.  Sponge, needle and instrument  counts were correct.                                                Lorre Munroe., M.D.    WB/MEDQ  D:  12/14/2002  T:  12/14/2002  Job:  [id number]   cc:   Gerlene Burdock A. Alanda Amass, M.D.  [phone] [address], [address]  [place]  [place] [address]  Fax: [phone]

## 2011-05-09 NOTE — Cardiovascular Report (Signed)
NAMEERVIN, [name] NO.:  [id number]   MEDICAL RECORD NO.:  [id number]          PATIENT TYPE:  INP   LOCATION:  2006                         FACILITY:  [facility]   PHYSICIAN:  Darlin Priestly, MD  DATE OF BIRTH:  [birth year]   DATE OF PROCEDURE:  01/23/2006  DATE OF DISCHARGE:                              CARDIAC CATHETERIZATION   PROCEDURE:  1.  Left heart catheterization.  2.  Coronary angiography.  3.  Left ventriculogram.  4.  Ascending aortography.  5.  Saphenous vein graft angiography.  6.  Left internal mammary angiography.   ATTENDING PHYSICIAN:  Darlin Priestly, M.D.   COMPLICATIONS:  None.   INDICATIONS FOR PROCEDURE:  Mr. Gulley is a 70 year old male patient of  Dr. Susa Griffins and Dr. Colon Flattery with a history of hypertension,  history of CAD status post initial coronary bypass surgery in 1990s with  redo in 2000 consisting of a LIMA to LAD, vein graft to diagonal, vein graft  to OM as well as a free RIMA jump graft to the diagonal to a lower OM.  The  patient had a previously patent vein graft to the RCA.  He has had PTCA and  stenting of the PDA through the vein graft.  He has known depressed EF in  the 30-40% range, recent cath, and echo.  He also has a history of alcohol  abuse as well as SVT and is status post atrial flutter ablation by Dr. Launa Grill.  He also has known PVD with renal artery stenosis.  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  This persisted for several hours and he  ultimately saw his primary care physician where he was noted to have a heart  rate of 200.  He was subsequently transferred to [facility] and  given IV Diltiazem en route and converted to sinus rhythm.  In the ER, he  was noted to have mild inferior ST elevation with ongoing chest pain.  He is  now brought for cardiac catheterization.   DESCRIPTION  OF PROCEDURE:  After giving informed written consent, the  patient was brought to the cardiac cath lab were the right and left groins  were shaved, prepped and draped in a sterile fashion.  ECG monitoring was  established.  Using modified Seldinger technique, a 6 French arterial sheath  was inserted in the right femoral artery.  6 French diagnostic catheters  were used to perform diagnostic angiography.   The left main is a medium size vessel which is calcified but has no  significant disease.   The LAD is totally occluded in its proximal portion.  The mid and distal LAD  filled via a patent LIMA with insertion to the mid portion of the LAD.  The  LIMA is widely patent with no significant disease in the LAD beyond the IMA  insertion.  There is retrograde fill  of one diagonal branch.   The second diagonal reveals a patent saphenous vein graft with insertion  at  the mid portion of the diagonal.  There is no significant disease in the  body of the graft distal to the graft insertion.   The left circumflex is a small vessel which is calcified, diffusely  diseased, and totally occluded in its mid portion.  There is a patent  saphenous vein graft to the OM which has no significant disease.  There is  retrograde fill in an upper branch of the OM.  There appears to be a RIMA  graft inserted on the proximal portion of the vein graft to the OM and then  inserts distally into a lower OM.  This graft is widely patent.   The RCA is totally occluded in its proximal portion.  There is a patent  saphenous vein graft in the insertion of the proximal portion of the PDA.  This graft is widely patent.  There is a stent noted in the PDA with mild  40% instant restenosis.   Left ventriculogram reveals moderate to severely depressed EF at 25-30% with  global hypokinesis.  There is inferior posterior basilar akinesis.   Ascending aortography revealed mild aortic insufficiency with mild to  moderate  aortic root dilatation.   Selective bilateral renal angiograms reveal 70% ostial right renal artery  stenosis with calcification and a 10 mm gradient.  Left renal artery appears  to have an 80-90% ostial stenosis.   HEMODYNAMICS:  Systemic arterial pressure 149/86, LV systolic pressure  161/9, LVEDP 25.   CONCLUSION:  1.  Significant three vessel coronary artery disease.  2.  Patent LIMA to the LAD with no significant disease beyond the IMA      insertion.  3.  Patent vein graft to the diagonal with no significant disease beyond the      vein graft insertion.  4.  Patent vein graft to the OM with no significant disease beyond the vein      graft insertion.  5.  Patent free RIMA which jumps from the vein graft to the OM to the lower      OM branch with no significant disease beyond the graft insertion.  6.  Patent saphenous vein graft to the PDA with 40% in stent restenosis      beyond the graft insertion.  7.  Moderate to severely depressed left ventricular systolic function.  8.  Mild aortic insufficiency.  9.  Mild to moderate aortic root dilatation.  10. Mild aortic stenosis with a 10-12 mm mean gradient.  11. Bilateral renal artery disease.      Darlin Priestly, MD  Electronically Signed     RHM/MEDQ  D:  01/23/2006  T:  01/23/2006  Job:  [id number]   cc:   Colon Flattery, D.O.  He has moved to [place].  This information was given by  his former office.   Richard A. Alanda Amass, M.D.  Fax: [phone]

## 2011-05-09 NOTE — Discharge Summary (Signed)
NAMEAARYAN, ESSMAN             ACCOUNT NO.:  [id number]   MEDICAL RECORD NO.:  [id number]          PATIENT TYPE:  INP   LOCATION:  4728                         FACILITY:  [facility]   PHYSICIAN:  Mobolaji B. Bakare, M.D.DATE OF BIRTH:  [birth year]   DATE OF ADMISSION:  04/04/2006  DATE OF DISCHARGE:  04/06/2006                                 DISCHARGE SUMMARY   PRIMARY CARE PHYSICIAN:  Unassigned.   CARDIOLOGIST:  Dr. Alanda Amass.   FINAL DIAGNOSES:  1.  Epistaxis.  2.  Vasovagal syncope.  3.  Tobacco abuse.   SECONDARY DIAGNOSES:  1.  Hypertension.  2.  Coronary artery disease.  3.  Tobacco abuse.   PROCEDURES:  1.  Carotid Doppler which showed 40-60% internal carotid artery stenosis on      the right.  There is no left internal carotid artery stenosis.  The left      CEA remains open.  2.  Head CT scan showed no acute intracranial abnormality.  No evidence of      intracranial hemorrhage.   BRIEF HISTORY:  [name] is a 70 year old Caucasian male who was at the  emergency room secondary to epistaxis.  While having his nose packed he  passed out.  Hence, he was admitted for further evaluation.  It is to be  noted that the patient has been on Coumadin for chronic anticoagulation.  His PT INR on admission was 26.6/2.4.  The initial vitals were temperature  97.3, blood pressure 167/103, which later improved to 129/79, pulse 105,  respiratory rate 12, O2 sat of 96% on room air.  Physical examination was  unremarkable except for systolic murmur which the patient said is not new.  He was not orthostatic.  Please refer to the admission H&P for full details.   HOSPITAL COURSE:  1.  Syncope.  Mr. Feister was admitted to telemetry floor given his      significant history of atherosclerotic disease.  The patient had carotid      endarterectomy and CAD is status post stent placement.  He was ruled out      for myocardial infarction with three negative cardiac enzymes.  EKG did     not show any acute ST changes.  Telemetry showed normal sinus rhythm.      There was no malignant arrhythmia.  He had a CT scan of the head which      showed no acute abnormality.  He had a 2D echocardiogram, results were      pending at the time of discharge.  2.  Epistaxis resolved without any active intervention.  3.  Blood pressure was controlled during the course of hospitalization.  4.  The patient received counseling for tobacco cessation and he expressed      understanding and wished to quit smoking.   CONDITION ON DISCHARGE:  The patient was stable.  There was no recurrence of  syncope.  He was able to ambulate without any difficulty.   DISCHARGE MEDICATIONS:  1.  Coumadin 75 mg p.o. daily.  2.  Folic acid 1 mg p.o. daily.  3.  Aspirin  81 mg p.o. daily.  4.  KCl 10 mEq p.o. daily.  5.  Digoxin 0.125 mg p.o. daily.  6.  Crestor 10 mg p.o. daily.  7.  Atenolol 50 mg p.o. daily.  There were no changes made to home medications.   FOLLOW UP:  With Dr. Alanda Amass, follow-up results of 2D echocardiogram.      Mobolaji B. Corky Downs, M.D.  Electronically Signed     MBB/MEDQ  D:  04/21/2006  T:  04/21/2006  Job:  [id number]   cc:   Gerlene Burdock A. Alanda Amass, M.D.  Fax: [phone]

## 2011-05-09 NOTE — H&P (Signed)
NAME:  [name] NO.:  [id number]   MEDICAL RECORD NO.:  [id number]          PATIENT TYPE:  OBV   LOCATION:  2031                         FACILITY:  [facility]   PHYSICIAN:  Ulyses Amor, MD DATE OF BIRTH:  06-07-1941   DATE OF ADMISSION:  12/16/2004  DATE OF DISCHARGE:                                HISTORY & PHYSICAL   IDENTIFYING DATA AND CHIEF COMPLAINT:  [name] is a 70 year old  white man who is admitted to [facility] for chest pain and atrial  flutter.   HISTORY OF THE PRESENT ILLNESS:  The patient has a history of coronary  artery disease, which dates back to 11.  At that time he underwent  coronary artery bypass surgery.  He underwent repeat coronary artery bypass  surgery in 2000.  During that period of time he reportedly suffered several  small myocardial infarctions.   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  It is  described as a vague burning and indigestion.  It was located in the  substernal region.  It did not radiate.  It was associated with dyspnea, but  no diaphoresis or nausea.  It continued throughout the course of the in a  very low-grade fashion.  However, it ultimately prompted his visit to the  emergency department.  There he was found to be in atrial flutter with 2:2  AV conduction.  He was treated with IV adenosine and diltiazem, and his  rhythm has since converted to normal sinus rhythm.  The patient reports no  dizziness, lightheadedness, syncope or near syncope, though he has noted  status some element of fatigue today.  He reports no specific chest pain,  tightness, heaviness, pressure, o squeezing.   PAST MEDICAL HISTORY:  There is no history of congestive heart failure or  arrhythmias as best as I can elicit from him (is prior records are on  microfilm and not yet available.  The patient has a history of  hypertension  and dyslipidemia, both of which are under treatment.   FAMILY HISTORY:  There is a strong family history of coronary artery  disease.   The patient does not smoke.  There is no history of diabetes mellitus.   PAST SURGICAL HISTORY:  The patient has undergone a left carotid  endarterectomy.   OTHER MEDICAL PROBLEMS:  Other medical problems include a cerebral aneurysm  and gout.   MEDICATIONS:  1.  Furosemide 40 mg p.o. daily.  2.  Crestor 10 mg p.o. daily.  3.  Plavix 75 mg p.o. daily.  4.  Allopurinol 300 mg p.o. daily.  5.  Nexium 40 mg p.o. daily.  6.  Atenolol 25 mg p.o. q. d.   ALLERGIES:  The patient is intolerant of LIPITOR.   Review of systems reveals new no problems related to her head, eyes, ears,  nose, mouth, throat, lungs, gastrointestinal system, genitourinary system,  or extremities.  There is no history of neurologic or psychiatric disorder.  There is no history of fever, chills or weight loss.  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  He  lives with his wife.  He neither drinks or smokes.   PHYSICAL EXAMINATION:  VITAL SIGNS:  Blood pressure 132/79, pulse 152 and  regular, respirations 22 and temperature 97.7.  GENERAL APPEARANCE:  The patient is a middle-aged white man in no  discomfort.  He is alert, oriented appropriate and responsive.  HEENT:  Head, eyes, nose and mouth are normal.  NECK:  The neck is without thyromegaly or adenopathy.  Carotid pulses are  palpable bilaterally and without bruits.  A healed left carotid  endarterectomy surgical incision is noted.  HEART:  Cardiac examination reveals a (performed after the patient was  converted to normal sinus rhythm) regular rhythm.  S1 and S2 are normal.  There is no S3 or S4.  There is no murmur, rub or click.  CHEST:  No chest wall tenderness is noted.  LUNGS:  Examination of the lungs reveal trace right basilar crackles.  ABDOMEN:  The abdomen is soft and  nontender. There is no mass,  hepatosplenomegaly, bruit, distention, rebound, guarding, or rigidity.  Bowel sounds are normal.  RECTAL AND GENITALIA:  Rectal and genital examinations are not performed as  they are not pertinent to the reason for acute care hospitalization.  EXTREMITIES:  The extremities are without edema, deviation or deformity.  Radial and dorsalis pedal pulses are palpable bilaterally.  NEUROLOGIC EXAMINATION:  Brief screening neurologic survey is unremarkable.   LABORATORY DATA:  The chest radiographs demonstrates no acute findings  according to the radiologist.  The initial electrocardiogram demonstrates  atrial flutter with 2:1 AV conduction and a ventricular rate of 154  beats/minute with nonspecific ST and T wave changes are noted, and evidence  of a prior inferior  myocardial infarction.  The rhythm strip obtained since  conversion revealed normal sinus rhythm.   The initial set of cardiac markers revealed a myoglobin of 84.6, CK/MB 4.2  and troponin less than 0.05.  Potassium is 3.4, BUN 10 and creatinine1.2.  The white count 7.8 with a hemoglobin of 13.4 and hematocrit of 38.2.  The  remaining studies are pending at the time of this dictation.   IMPRESSION:  1.  Atrial flutter with 2:1 atrioventricular conduction, resolved.  2.  Chest pain, rule out cardiac ischemia.  3.  Coronary artery disease; status post coronary artery bypass surgery in      1993 and in 2000.  4.  Dyslipidemia.  5.  Hypertension.  6.  Status post left carotid endarterectomy.  7.  Cerebral aneurysm.  8.  Gout.   PLAN:  1.  Telemetry.  2.  Serial cardiac enzymes.  3.  Aspirin.  4.  Lovenox.  5.  Intravenous nitroglycerin.  6.  Echocardiogram.  7.  Thyroid function tests.  8.  Further measures per Dr. Alanda Amass.      [facility]   MSC/MEDQ  D:  12/17/2004  T:  12/17/2004  Job:  [id number]

## 2011-05-09 NOTE — Discharge Summary (Signed)
NAME:  [name], [name]                       ACCOUNT NO.:  [id number]   MEDICAL RECORD NO.:  [id number]                   PATIENT TYPE:  INP   LOCATION:  3314                                 FACILITY:  [facility]   PHYSICIAN:  Richard A. Alanda Amass, M.D.          DATE OF BIRTH:  26-Jun-1941   DATE OF ADMISSION:  12/09/2002  DATE OF DISCHARGE:  12/15/2002                                 DISCHARGE SUMMARY   DISCHARGE DIAGNOSES:  1. High-grade left carotid stenosis.     a. Left carotid endarterectomy on 12/14/02.  2. Probable transient ischemic attack and amaurosis fugax secondary to #1.  3. History of coronary artery disease with coronary artery bypass grafting     x1 in 3/93, repeat coronary artery bypass grafting x4 in 10/00.  4. Left ventricular dysfunction without overt congestive heart failure.  5. Hyperlipidemia.  6. Chews tobacco, nonsmoker.  7. Gout with flare-up during admission, improved at discharge.  8. History of supraventricular tachycardia, no recurrence.   DISCHARGE MEDICATIONS:  1. Usual medications as needed.  2. Tylenol p.r.n.  3. Vicodin as prescribed p.r.n.   ACTIVITY:  Light activity.   DIET:  Usual diet.   WOUND CARE:  Remove bandage and shower on Friday.  Call [phone] for  problems post surgery.  See Dr. Orson Slick in two to three weeks.   HISTORY OF PRESENT ILLNESS:  The patient is a 70 year old male patient of  Dr. Alanda Amass who was seen in the office by Dr. Alanda Amass on 12/09/02,  secondary to high-grade symptomatic left internal carotid disease documented  on recent outpatient carotid duplex and subsequent carotid angiography of  12/08/02, by Dr. Corliss Skains.   The patient was brought into [facility] for evaluation by Dr. Orson Slick and  planned for surgery.   PAST MEDICAL HISTORY:  1. Significant for coronary artery disease with a remote coronary artery     bypass grafting in 3/93, and a redo coronary artery bypass grafting in     10/00, by Dr.  Tyrone Sage for progression of the disease, and that included     a left internal mammary artery to the left anterior descending, a free     left internal mammary artery to the circumflex, a saphenous vein graft to     the ramus, and a saphenous vein graft to the diagonal.  His vein graft to     his right coronary artery from 1993 was patent and functional.     Preoperative ejection fraction was 30%.  He has had a Cardiolite on     05/20/02, without ischemia, ejection fraction of 30%.  2. Prior history of supraventricular tachycardia.  3. Re-entry AV nodal tachycardia, requiring hospitalization in 11/00.     Treated medically with beta blockers, no recurrence.  It was also     associated with hypokalemia.  4. Prior to his carotid Doppler the patient had transient visual abnormality     of his left  eye associated with light-headedness and double vision when     looking ahead.  5. The patient also has a small anterior communicating aneurysm with his     cerebral angiogram.   FAMILY HISTORY:  See H&P.   SOCIAL HISTORY:  See H&P.   REVIEW OF SYMPTOMS:  See H&P.   DISCHARGE PHYSICAL EXAMINATION:  VITAL SIGNS:  Blood pressure 105/65, pulse  72, respirations 15, temperature 99.3.  GENERAL:  The patient is an alert and oriented white male.  LUNGS:  Clear to auscultation.  HEART:  Regular rate and rhythm.  NECK:  Incision to the left neck is stable.  No erythema.  EXTREMITIES:  Without edema.   LABORATORY DATA:  On admission hemoglobin was 13.9, hematocrit 40, white  blood cell count 6.9, platelets 215, neutrophils 16, lymphs 25, monos 13,  eosinophils 2, basophils 1.  Platelet count on 12/11/02, did rise to 11.6,  but after that was within normal limits.  Otherwise, normal.   Occult blood was negative.   Prothrombin time on 12/12/02, was 14.1, INR of 1.1 on heparin.  He was  therapeutic in the 80's.   Chemistries:  Sodium 138, potassium on admission 3.4, that was supplemented,  prior to  discharge it was 3.7.  Chloride 106, CO2 25, glucose 115, BUN 10,  creatinine 0.9, calcium 9.2, total protein 6.8, albumin 3.4,  AST 24, ALT  25, ALP 61, total bilirubin 0.7, uric acid 6.  Glycohemoglobin was 12.9.  Homocystine was 12.53.   Cholesterol 172, triglycerides 154, HDL 42, and LDL 99.   TSH 2.650.  Urinalysis was clear.   PA and lateral chest x-ray on admission showed mild cardiomegaly and  evidence of previous cardiac surgery.  No evidence of acute cardiopulmonary  disease.   EKG on admission showed sinus rhythm, left atrial enlargement, left axis  deviation, left ventricular hypertrophy, prior inferior infarction, non-  specific intraventricular conduction delay compared with 10/02/99, right  bundle branch block had resolved.   Please note prior to the admission the patient did have a cerebral  angiogram.    HOSPITAL COURSE:  The patient was admitted by Dr. Alanda Amass secondary to a  carotid stenosis symptomatic.  He was placed on IV heparin, potassium was  replaced, and plans were for surgery by Dr. Marcy Panning.  The patient  underwent the surgery on 12/14/02, did well.  By 12/15/02, was ready for  discharge home, both from Dr. Cammie Sickle standpoint as well as Dr. Nanetta Batty.  The patient was discharged and will follow up.        [name]. In[place], N.P.                     Richard A. Alanda Amass, M.D.    LRI/MEDQ  D:  01/31/2003  T:  01/31/2003  Job:  [id number]   cc:   Lorre Munroe., M.D.  Fax: [phone]   [facility]  [address]  [place]  [place] [address]  Fax: [phone]

## 2011-05-09 NOTE — Discharge Summary (Signed)
NAMEJAMAREON, SHIMEL             ACCOUNT NO.:  [id number]   MEDICAL RECORD NO.:  [id number]          PATIENT TYPE:  INP   LOCATION:  2006                         FACILITY:  [facility]   PHYSICIAN:  Richard A. Alanda Amass, M.D.DATE OF BIRTH:  [birth year]   DATE OF ADMISSION:  01/23/2006  DATE OF DISCHARGE:  01/25/2006                                 DISCHARGE SUMMARY   DISCHARGE DIAGNOSES:  1.  Chest pain on admission with positive troponins, patent grafts at      catheterization this admission.  2.  Known coronary disease with coronary artery bypass grafting in the early      1990s with redo in 2000.  3.  History of atrial flutter, status post ablation by Dr. Severiano Gilbert on February      2006.  4.  Left ventricular dysfunction with an ejection fraction 25-35% at      catheterization.  5.  Peripheral vascular disease with a prior left carotid endarterectomy in      December 2003 and known renal artery stenosis of 60-70%  6.  History of gout.  7.  Dyslipidemia.  8.  Treated hypertension.   HOSPITAL COURSE:  The patient is a 70 year old male followed by Dr.  Alanda Amass with a history of coronary disease as noted above. He had a  ablation for flutter by Dr. Severiano Gilbert in February 2006. He was cathetered in  December 2006 and had patent grafts. He presented to the emergency room  January 23, 2006 with chest pain. He had some ST elevation in III and aVF.  Initial troponins were positive and it was decided to proceed with  diagnostic catheterization. This was done January 23, 2006 by Dr. Jenne Campus.  This revealed patent LIMA to the LAD, patent SVG to the diagonal, patent SVG  to the OM and patent SVG to the PDA. EF was 25-30% with global hypokinesis.  Right renal was 70% narrowed in the left 80-90%. Troponins peaked at 1.66.  In retrospect, the patient says he has chest pain associated with  tachycardia. On telemetry, he did have runs of nonsustained wide complex  tachycardia. We increased his  beta-blocker. He was kept another 24 hours.  Dr. Elsie Lincoln feels he can be discharged January 25, 2006. He will need an  outpatient 30-day event monitor and follow-up with Dr. Alanda Amass. He may be  a candidate for an ICD. Dr. Elsie Lincoln does note that he feels the wide complex  tachycardia was an SVT by his inspection.   DISCHARGE MEDICATIONS:  1.  Aspirin 81 milligrams a day.  2.  Allopurinol 300 milligrams a day.  3.  Atenolol 50 milligrams a day.  4.  Crestor 10 milligrams a day.  5.  Lasix 20 milligrams a day.  6.  Lanoxin 0.125 milligrams a day.  7.  Diltiazem 180 milligrams a day.  8.  Potassium 10 mEq a day.  9.  Vasotec 2.5 milligrams a day.  10. At one point, he was on Coumadin but apparently is not on this anymore.  11. We had asked him to take Plavix in the past but the patient does not  take this.   LABS AT DISCHARGE:  White count 8.4, hemoglobin 13.0, hematocrit 38,  platelets 266,000. Sodium 137, potassium 3.5, BUN 6, creatinine 0.8. CKs  peaked at 182 with 14 MBs and a troponin of 1.66. A urine culture was  obtained on the third and the results of this were pending. Hemoglobin A1c  is 5.9, uric acid 8.0. TSH 3.78. BNP 199. Chest x-ray shows possible apical  lung lesion. CT of the chest is suggested. We will discuss this with the  patient. He may want this done as an outpatient. INR at admission is 1.0.   DISPOSITION:  The patient is discharged in stable condition. He will need to  follow up with Dr. Alanda Amass. He needs a CT scan of his chest as an  outpatient. He will need a 30-day event monitor. He may be a candidate for  an ICD now that his EF is 25-30%. I believe previously his EF was measured  at 45%. We did increase his beta-blocker.      Abelino Derrick, P.A.      Richard A. Alanda Amass, M.D.  Electronically Signed    LKK/MEDQ  D:  01/25/2006  T:  01/25/2006  Job:  [id number]

## 2011-05-09 NOTE — Consult Note (Signed)
NAME:  [name], [name]          ACCOUNT NO.:  [id number]   MEDICAL RECORD NO.:  [id number]          PATIENT TYPE:  OBV   LOCATION:  6531                         FACILITY:  [facility]   PHYSICIAN:  Janeece Riggers. Severiano Gilbert, M.D.    DATE OF BIRTH:  1941/11/10   DATE OF CONSULTATION:  12/18/2004  DATE OF DISCHARGE:                                   CONSULTATION   REASON FOR CONSULTATION:  Atrial flutter.   HISTORY OF PRESENT ILLNESS:  This 70 year old white male admitted with  palpitations, anginal quality chest pain in an unstable pattern, and  abnormal troponin.  The initial electrocardiogram demonstrated atrial  flutter with a rapid ventricular response.  The patient was admitted to the  hospital and treated with rate control as well as heparinization.  Secondary  to chest pain and abnormal troponin, the patient was taken to the cardiac  catheterization, at which time his bypass grafts were deemed to be open, and  he had distal native vessel disease in the right coronary distribution which  was treated with a non-drug-eluting stents.  During hospitalization, the  patient spontaneously converted to a sinus rhythm.  It appears that the  patient had been complaining of palpitations, associated with anginal  discomfort for approximately 7-10 days prior to his admission.   PAST MEDICAL HISTORY:  1.  Atherosclerotic coronary artery disease, status post stenting and      coronary artery bypass graft surgery in 1993.  2.  Hypertension.  3.  Dyslipidemia.  4.  Atherosclerotic cerebrovascular disease, status post carotid      endarterectomy.  5.  Asymptomatic cerebral aneurysm, incidentally discovered at the time of      angiography.  The past medical history is negative for congestive heart failure and prior  CVA.   FAMILY HISTORY:  Positive for coronary artery disease.  There is no history  of diabetes.   SOCIAL HISTORY:  The patient does not smoke.  He does not use illicit drugs.  He is  married.   MEDICATIONS:  1.  Allopurinol 300 mg p.o. daily.  2.  Atenolol 25 mg p.o. daily.  3.  Plavix 75 mg p.o. daily.  4.  Cardizem CD 180 mg p.o. daily.  5.  Lasix 40 mg IV.  6.  Nitroglycerin p.r.n.   ALLERGIES:  No known drug allergies.  The patient is intolerant of  ATORVASTATIN.   REVIEW OF SYSTEMS:  Negative for problems related to the head, eyes, ears,  nose, throat, mouth, lungs, GI system, GU system, musculoskeletal or skin.  There is no recent history of neurological or psychiatric disorders or  complaints.  In general, he has had no symptoms of fever, chills, or weight  loss.  CARDIOVASCULAR:  Positive for palpitations and angina as described in  the HPI.   PHYSICAL EXAMINATION:  VITAL SIGNS:  Blood pressure 122.78, pulse 78,  afebrile, respirations 16.  GENERAL:  A well-developed and well-nourished white male, sitting at the  bedside, in no acute distress.  HEENT:  Normocephalic and atraumatic.  Midline nares.  Moist mucous  membranes orally.  NECK:  Supple, with 2+ and equal  carotids.  JVP 5-7.  No thyromegaly or  adenopathy.  BACK:  Negative CVAT.  CHEST:  Normal male.  Respiratory clear to auscultation and percussion.  CARDIOVASCULAR:  A regular rate and rhythm without murmurs.  Positive S4.  ABDOMEN:  Soft, nontender, positive bowel sounds.  No hepatosplenomegaly, no  masses or bruits.  GENITOURINARY:  Deferred.  EXTREMITIES:  No clubbing, cyanosis or edema.  Peripheral pulses 2+ and  equal.  SKIN:  No areas of ecchymosis or breakdown.  NEUROLOGIC:  Alert and oriented x4.  Motor and sensory nonfocal.  Cranial  nerves II-XII  intact.   On presentation, electrocardiogram demonstrates atrial flutter, typical  isthmus-dependent pattern with rapid ventricular response with nonspecific  ST-T wave changes.  There is a pattern consistent with left ventricular  hypertrophy by voltage criteria.   Two:  A spontaneous conversion to sinus rhythm demonstrates normal  sinus  rhythm, left axis deviation, LVH, lateral ST-T wave changes consistent with  repolarization abnormality.   Echocardiogram demonstrates reduced LV systolic function during an episode  of rapid ventricular response, atrial fibrillation.   LABORATORY DATA:  White count 7200, platelet count 223,000, hematocrit 36%.  Potassium 3.3, currently being replaced, creatinine 1.2, fasting glucose  120.  Heparin level 0.4 which is therapeutic.  Negative liver function  tests.  Peak troponin 0.13.  Negative urinalysis.   IMPRESSION:  Atrial flutter:  The patient with an electrocardiogram  demonstrating classic isthmus-dependent flutter with poorly controlled  ventricular response, resulting in a non-Q-wave infarction and significant  symptoms of unstable angina.  Since revascularization and conversion to  sinus rhythm with medical therapy, the patient's angina has resolved, and he  is at his usual baseline physical condition.  The patient has multiple risk  factors for a cerebrovascular accident, given the diagnoses of atrial  flutter, to include hypertension, coronary artery disease, and probably a  depressed ejection fraction, based on a recent echocardiogram.  He has no  recent high-risk features for bleeding risk.  He does have a cerebral  aneurysm, although this is asymptomatic, and there has been no history of  cerebrovascular bleed.  As such, he is a candidate for long-term  anticoagulation.   RECOMMENDATIONS:  Secondary to the patient's own preferences, as well as the  medical consequences of poorly-controlled rapid ventricular response and  atrial flutter, he is a candidate for atrial flutter ablation.  After  hearing the risks and benefits of this proposed treatment, the patient  elects to proceed.  The timing of his ablation will be most problematic at  this time.  Given the duration of his atrial flutter, of a number of days without preceding anticoagulation, I feel that he most  likely needs about  three to four weeks of anticoagulation, as well as a minimum of two weeks of  Plavix therapy, given his recent stent placement, before we can go through a  window of Coumadin withdrawal to do the procedure.  As far as rate control  goes, I agree that beta blockers and calcium channel blockers plus or minus  the addition of digoxin, will help protect him from rapid ventricular  response and atrial flutter during the interim.   PLAN:  1.  Rate control as already initiated in the hospital.  2.  Correction of underlying electrolyte abnormalities.  3.  Allow three weeks for anticoagulation and Plavix therapy to optimize he      appropriate timing for atrial flutter ablation.  4.  Outpatient visit with me in approximately  three weeks to coordinate      flutter ablation.      Mark   MEP/MEDQ  D:  12/18/2004  T:  12/18/2004  Job:  [id number]   cc:   Gerlene Burdock A. Alanda Amass, M.D.  [phone] [address]  [place]  [place] [address]  Fax: [phone]

## 2011-05-09 NOTE — Discharge Summary (Signed)
NAMERENATO, SPELLMAN             ACCOUNT NO.:  [id number]   MEDICAL RECORD NO.:  [id number]          PATIENT TYPE:  INP   LOCATION:  4708                         FACILITY:  [facility]   PHYSICIAN:  Cristy Hilts. Jacinto Halim, MD       DATE OF BIRTH:  1941/05/30   DATE OF ADMISSION:  11/27/2005  DATE OF DISCHARGE:  11/29/2005                                 DISCHARGE SUMMARY   HISTORY OF PRESENT ILLNESS:  [name] is a 70 year old white  married male patient of Dr. Susa Griffins with a prior medical history  of coronary artery disease.  He had a CABG in 1993 and a redo in 2000.  He  has had multiple myocardial infarctions previous to both procedures.  His  last catheterization was December 18, 2003 with a presentation of atrial  flutter with rapid ventricular response.  He had a catheterization and two  stents to his PDA after his SVG to his RCA.  His other grafts were patent.  He had LV with EF of 30%.  He later on had a radiofrequency ablation for  atrial flutter by Dr. Severiano Gilbert at the beginning of this year.  He came into the  hospital with ongoing chest pain that had occurred on and off for about one  week.  He also had gas and bloating.  His point-of-care markers x3 came back  negative.  His other labs were within normal limits.  He was seen by Dr.  Nanetta Batty.  It was decided that he should undergo cardiac  catheterization.  This was performed on November 28, 2005.  He had patent  grafts.  One of his PDA stents had a 50% in-stent restenosis.  The other one  was patent.  He had left renal artery stenosis of 60-70%.  He had severe  native vessel disease.  He was seen by Dr. Jacinto Halim on November 29, 2005.  His  blood pressure was 110/62.  Temperature was 98.3, respirations 20, heart  rate 55.  It was decided that he was stable to be discharged home.  He was  sent home on Protonix 40 mg once a day for probable reflux causing his chest  pain.   LABORATORY DATA:  Hemoglobin 13.4, hematocrit  38.5, platelets 202, WBC 6.4.  INR 1.0.  Sodium 138, potassium 3.9, chloride 104, CO2 28, BUN 9, creatinine  0.8, glucose 113.  Point-of-care markers were all negative x3.  His digoxin  level was 0.2.   DISCHARGE MEDICATIONS:  1.  Protonix 40 mg one every day an hour before meals.  2.  Klor-con 10 mEq per day.  3.  Diltiazem CD 180 mg one time per day.  4.  Digoxin 0.125 mg one time per day.  5.  Furosemide was decreased to 20 mg every day.  6.  Atenolol 50 mg once time per day.  7.  Crestor 10 mg one time per day.  8.  Plavix 75 mg one time per day.  9.  Allopurinil 300 mg one time per day.  10. Aspirin 81 mg two per day.  11. Vasotec 2.5 mg  one time per day, which is new.  12. Nitroglycerin 1/150 under tongue p.r.n. when needed for chest pain.   FOLLOW UP:  He is to follow up with Dr. Alanda Amass in two to three weeks.  He  should get a BMET drawn in one week since he is now on Vasotec for his LVD.   DISCHARGE DIAGNOSES:  1.  Chest pain but not coronary ischemia, status post catheterization with      no high-grade blockage.  2.  Gastroesophageal reflux disease with gas and bloating, probably cause of      chest pain.  3.  History of atrial flutter ablation in sinus rhythm during this      hospitalization.  4.  History of ischemic cardiomyopathy with an ejection fraction at this      catheterization of 45%.  5.  Peripheral vascular disease with 60-70% left renal artery stenosis.  6.  Hypertension.  7.  Hyperlipidemia.   NOTE:  His furosemide was also decreased.  When he came in, his potassium  was low at 3.1.  With the addition of Vasotec, his Klor-con was not  increased.      Lezlie Octave, N.P.      Cristy Hilts. Jacinto Halim, MD  Electronically Signed    BB/MEDQ  D:  11/29/2005  T:  11/30/2005  Job:  [id number]   cc:   Ernestina Penna, M.D.  Fax: [phone]

## 2011-05-09 NOTE — Cardiovascular Report (Signed)
NAME:  [name], [name]          ACCOUNT NO.:  [id number]   MEDICAL RECORD NO.:  [id number]          PATIENT TYPE:  OBV   LOCATION:  2031                         FACILITY:  [facility]   PHYSICIAN:  Richard A. Alanda Amass, M.D.DATE OF BIRTH:  22-Nov-1941   DATE OF PROCEDURE:  12/17/2004  DATE OF DISCHARGE:                              CARDIAC CATHETERIZATION   PROCEDURES:  1.  Retrograde central aortic catheterization.  2.  Selective saphenous vein graft, right coronary artery.  3.  Angiography pre and post intracoronary nitroglycerin administration.  4.  Percutaneous transluminal coronary angiography for tandem high-grade      posterior descending artery stenosis through a graft with subsequent      tandem ML Mini Vision Guidant 2.5 chromium cobalt 12 mm stents.  5.  Weight-adjusted heparin, Aggrastat double bolus plus infusion,      additional Plavix 150 mg and Cardizem and Lopressor for rate control of      atrial flutter.   Please see complete catheterization report dictated earlier.  Essentially,  this patient has a complex history of remote PCIs.  He had redo CABG by Dr.  Tyrone Sage on October 01, 1999, and was admitted on this occasion with atrial  flutter, variable block, new onset, probably about a week, and chest pain  compatible with ischemia.  Previous angiography showed widely patent grafts  with LV dysfunction, however, this was done with atrial flutter at +/- 100 a  minute and EF approximately 30-35%, however, this may be higher when the  patient is in sinus rhythm and will need to be repeated.  No overt CHF.  Associated history of hypertension and hyperlipidemia on medical therapy for  medical complaints in the past.  Nonsmoker.  Elective LCEA by Dr. Orson Slick.   He had patent LIMA to LAD, patent SVG to diagonal, patent free RIMA to OM  and patent SVG to OD with retrograde filling of both branches (common origin  of SVG to OD and free RIMA to OM).  These above grafts were  patent from his  redo CABG of October 01, 1999.  He had patent SVG to the distal RCA from his  1993 surgery by tandem and high grade 85% and 95% stenoses beyond the  insertion site in the PDA.   It was elected to proceed with culprit lesion intervention in this setting.  Because of the patient's atrial flutter, we were concerned about long-term  Plavix therapy since he will require Coumadin regardless of the treatment  for his atrial flutter at least in the immediate future.  The patient was  given 150 mg of additional Plavix (been on this at home).  He required 10 mg  in divided doses of Lopressor for rate control of his atrial flutter and an  additional 10 mg of IV Cardizem for rate control.  He was in and out of  sinus rhythm and atrial flutter during the procedure, but predominantly  atrial flutter.  He was given Aggrastat double bolus plus infusion, weight-  adjusted heparin at 5000 units.  The SVG to the RCA was intubated with a  right coronary 6 French Sci-Med bypass guiding  catheter.  The lesion was  crossed with a 0.014 __________ soft wire.  We initially tried to cross the  lesion with a 2.0/12 Maverick balloon.  I initially was able to get across  using the balloon across the second tandem lesion with the wire.  I was not  able to initially cross with the balloon and I dilated the proximal PDA  lesion first at 10/35 and 10/39.  I was then able to get across the mid PDA  lesion and this was dilated to 10-36.  The balloon was pulled back.  IC  nitroglycerin was given.  There was residual stenosis and recoil, so it was  elected to proceed with non-DES chromium cobalt small-vessel stents.  A  2.5/12 Guidant Mini Vision was prepared using normal exchange position  across the mid PDA lesion just beyond the bifurcation branch.  This was  deployed at 12-32 and post dilated at 14-33.  The balloon was then pulled  back.  A second tandem Guidant Mini Vision 2.5/12 stent was then prepared   and using exchange technique was deployed across the proximal PDA lesion at  12-30 and the post dilated at 16-33.  The balloon was deflated and removed  and final injections were performed.  The tandem mid and proximal PDA  stenoses were reduced from 85% to 95% to 0% to 10%.  There was some  irregularity of the PDA, but no dissection and good flow.  This was felt to  be a very adequate result.  The patient was stable.  The final dilatation  system was removed.  The final ACT was 300 seconds.  The patient was  transferred to the holding area for postoperative care.   We plan to continue Aggrastat for 18 hours, continue aspirin and Plavix at  present and prophylactic PPI.  We will start his heparin back at six hours  after sheath removal if his right groin is stable with infusion without  bolus.   We will then consider starting Coumadin with heparin crossover for embolic  prophylaxis.  When is INR is therapeutic, we will discontinue his aspirin  and treat him with Coumadin and Plavix, carefully monitoring him as an in  and out patient.   Will obtain EP consultation for evaluation of his atrial flutter and  consider for possible flutter ablation versus medical therapy.  At present,  he will be treated with calcium and beta blockers without antiarrhythmics.   The patient has had successful culprit lesion PCI of his PDA and has  fortunately patent grafts as outlined above from his prior surgery of  October of 2000.  It should be noted that in his operative note of that, he  was not felt to be a candidate for any redo surgery again because of his  anatomy with tortuous dilated aortic root and graft insertions as outlined.   Please see catheterization report for full list of diagnoses.      Rich   RAW/MEDQ  D:  12/17/2004  T:  12/17/2004  Job:  [id number]   cc:   [facility]   Ernestina Penna, M.D.  [address] [place] [address]  Fax: [phone]

## 2011-05-09 NOTE — Consult Note (Signed)
[facility]  Patient:    [name]                     MRN: [id number] Proc. Date: 09/30/99 Adm. Date:  [id number] Attending:  Berry, Jonathan [place]                          Consultation Report  CARDIAC SURGERY CONSULTATION  REASON FOR CONSULTATION:  Progression of coronary occlusive disease.  HISTORY OF PRESENT ILLNESS:  The patient is a 70 year old male with a history of hypertension.  He is status post failed angioplasty of the right coronary artery and emergency coronary artery bypass grafting x 1 in 1993.  The patient did well following surgery and has had little medical attention since but has been noted to be very hypertensive at times with his blood pressure as high as 240/120.  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  Because of the progressive pain he called EMT and was brought to the hospital.  CK-MBs were elevated with this event.  The patient also had a  previous history of myocardial infarction prior to his bypass surgery.  MEDICATIONS AT HOME:  None.  ALLERGIES:  The patient lists none.  In review of the patients chart and old operative note, there was some question as to whether he had a protamine reaction during the operative procedure.  SOCIAL HISTORY:  The patient drinks approximately one six-pack a day and chews tobacco.  He does not smoke.  He is currently in the process of curing tobacco.  FAMILY HISTORY:  Family history is significant for coronary artery disease and stroke.  REVIEW OF SYSTEMS:  The patient denies any TIAs or previous stroke.  He denies smoking.   He denies any blood in his urine or stool.  He does note increasing shortness of breath over the past several months with exertion.  PHYSICAL EXAMINATION:  GENERAL:  The patient is in no acute  distress at the time of his examination.  VITAL SIGNS:  His blood pressure is 120/68, pulse is 75 and regular, respiratory rate is 20.  NECK:  The neck is without carotid bruits or jugular venous distension.  LUNGS:  His lungs are clear bilaterally.  HEART:  He has a normal S1 and S2.  I did not appreciate any murmur of aortic insufficiency.  He does have a well-healed midline sternal incision and an incision in the left lower leg for vein harvest site previously.  He has 2+ DP and PT pulses.  LABORATORY DATA:  Laboratory findings included a potassium of 3.1, hematocrit of 40.6.  The patients total CK was 763 with an MB of 152.  The patient was stabilized medically.  He had no further chest pain during the hospitalization.  He underwent cardiac catheterization by Dr. Lenise Herald today. This demonstrated a decreased ejection fraction of approximately 30% with severe global hypokinesis and posterobasilar akinesis with no MR.  There was mild AI and a slightly dilated aortic root.  Compared to the previous films the size of the root appears unchanged.  The native right coronary artery was totally occluded. There was a patent vein graft to the posterior descending very far distal and the posterior descending was an 80% lesion.  The circumflex coronary artery had a 70-80% proximal lesion.  He had a ramus branch which bifurcated with 70% stenosis. The LAD was diffusely diseased with several areas of 80-90% stenosis.  The high  diagonal was small.  With the patients recurrent and progressive coronary occlusive disease, in spite of the patent graft to the posterior descending which was placed in 1993, redo coronary artery bypass graft has been recommended to the patient.  The increased risk of the procedure has been discussed with him in detail because of the redo  status, his recent myocardial infarction and the poor LV function.  The risk of  death, infection, stroke,  myocardial infarction, bleeding and blood transfusions have all been discussed with the patient in detail and he is willing to proceed. We tentatively plan to proceed on October 01, 1999.  Prior to surgery we will obtain a CT scam The patient evaluate the size of his ascending and descending aorta. DD:  09/30/99 TD:  09/30/99 Job: [id number]

## 2011-05-09 NOTE — Discharge Summary (Signed)
[name], DANSBY             ACCOUNT NO.:  [id number]   MEDICAL RECORD NO.:  [id number]          PATIENT TYPE:  OIB   LOCATION:  3735                         FACILITY:  [facility]   PHYSICIAN:  Raymon Mutton, P.A. DATE OF BIRTH:  09-Dec-1941   DATE OF ADMISSION:  01/23/2005  DATE OF DISCHARGE:  01/24/2005                                 DISCHARGE SUMMARY   DISCHARGE DIAGNOSES:  1.  Atrial flutter status post AP evaluation and __________ ablation.  They      are successful.  The patient maintained sinus rhythm.  2.  Hypertension.  3.  Hyperlipidemia.  4.  Coronary artery disease status post coronary artery bypass graft.  5.  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  6.  Peripheral vascular disease status post carotid endarterectomy.  7.  Asymptomatic cerebral aneurysm.  8.  Gout.   DISCHARGE MEDICATIONS:  1.  Aspirin 81 mg daily.  2.  Allopurinol 300 mg daily.  3.  Atenolol 50 mg daily.  4.  Crestor 10 mg daily.  5.  Lasix 40 mg daily.  6.  Diltiazem CD 180 mg daily.  7.  Chlorocon 10 mEq daily.  8.  Plavix 75 mg daily.  9.  Coumadin as directed.  10. Nitroglycerin 0.4 mg p.r.n.   HISTORY OF PRESENT ILLNESS/HOSPITAL COURSE:  This is a 70 year old Caucasian  male patient of Dr. Alanda Amass with prior history of MI and coronary artery  bypass grafting who was seen by Dr. Severiano Gilbert in our office for atrial flutter  and possible ablation.  He was interviewed, a suitable candidate for  ablation since he had a document that atrial tachyarrhythmia with quite  extensive __________ pattern.  The patient expressed desire to proceed with  this procedure and was admitted to [facility] on January 23, 2005.   HOSPITAL PROCEDURES:  A comprehensive electrophysiology study with arrythmia  induction and radiofrequency ablation of supraventricular tachycardia.  Substrate was performed by Dr. Severiano Gilbert.  The patient tolerated the procedure  well, was converted to sinus rhythm and  maintained his rhythm throughout his  admission.  For fuller description of the procedure, please refer to Dr.  Aggie Hacker dictation.   The next morning, he was evaluated by Dr. Nicholaus Bloom, and telemetry shows sinus  rhythm with first degree AV block. His INR was 1.9.  He was deemed to be  stable for discharge home.   DISCHARGE INSTRUCTIONS:  1.  Activity:  No driving.  No heavy lifting greater than 5 pounds for 2-3      days.  2.  Diet:  Low-fat, low-cholesterol diet.  3.  In case he develops problems with his groin puncture site, he was      advised to call our office.   FOLLOWUP:  Dr. Severiano Gilbert will see him on Friday, February 10 at 2:45 p.m.      MK/MEDQ  D:  01/24/2005  T:  01/24/2005  Job:  [id number]   cc:   [facility]

## 2011-05-09 NOTE — Cardiovascular Report (Signed)
NAME:  [name], [name]          ACCOUNT NO.:  [id number]   MEDICAL RECORD NO.:  [id number]          PATIENT TYPE:  OBV   LOCATION:  2031                         FACILITY:  [facility]   PHYSICIAN:  Richard A. Alanda Amass, M.D.DATE OF BIRTH:  Feb 12, 1941   DATE OF PROCEDURE:  12/17/2004  DATE OF DISCHARGE:                              CARDIAC CATHETERIZATION   PROCEDURE:  Retrograde central aortic catheterization, selective coronary  angiography via Judkins technique, selective left internal mammary artery,  saphenous vein graft angiography, selective free right internal mammary  artery, left ventricular angiogram, right anterior oblique, left anterior  oblique projection, abdominal aortic angiogram, selective right renal  angiogram, hand injection.   CARDIOLOGIST:  Richard A. Alanda Amass, M.D.   DESCRIPTION OF PROCEDURE:  The above procedure was done through the [facility]  which was entered with a single anterior puncture using a #18 gauge thin-  walled needle and modified Seldinger technique, inserting a 6-French Daig  short sheath after the patient was premedicated with 5 mg of Valium p.o.  The right groin was prepped and draped in the usual manner with 1% Xylocaine  used for local anesthesia.  He was given 2 mg of Versed for sedation.  Omnipaque dye was used throughout the procedure.  A 6-French JL6 diagnostic  catheter was required for native left coronary angiography because of his  large aortic root.  A JR4 was used for the right coronary.  A 6-French IMA  catheter was used for selective LIMA.  The right coronary catheter was used  for sub-selective right brachial cephalic hand injection.  A left coronary  bypass catheter was too short to reach the saphenous vein grafts, and on the  left a silhouetted Amplatz left #3 curved catheter was used and manipulated  for selective injection of the SVG to the diagonal, common ostia of the SVG  to the OD and the RIMA to the OM, from a very high  position on the aortic  root.  Also a separate ostia SVG to the diagonal was selective catheterized  with the Amplatz.  A right coronary bypass catheter was used for SVG to the  RCA injection.  The left ventricular angiogram was done in the RAO and LAO projection at 25  mL, 14 mL per second, and 20 mL, 12 mL per second through an angled 6-French  pigtail catheter.  Pullback pressure of the CA was performed and showed no  gradient across the aortic valve.  The patient was given a total of 10 mg of  Lopressor IV in divided doses in the laboratory for rate control.  He was in  paroxysmal atrial flutter and predominantly fluttered during the diagnostic  procedure, and during the LV angiogram.  The rate was approximately  plus/minus 100, with flutter waves visualized.  An abdominal angiogram was done at 25 mL, 20 mL per second, with  visualization to the external iliacs and right profunda/SFA junction.  Selective right renal angiogram was done by hand injection.  The catheters  were removed.  The side-arm sheath was flushed.  The patient tolerated the  diagnostic procedure well.  He had runs of sinus rhythm  and runs of  paroxysmal atrial flutter after the diagnostic procedure.   PRESSURES:  Left ventricle:  120/0.  LVEDP:  18-20 mmHg.  CA:  120/80 mmHg.  There was no gradient across the aortic valve on catheter  pullback.   RESULTS:  1.  LEFT ANTERIOR DESCENDING CORONARY ARTERY:  The fluoroscopy showed 3+      calcification of the proximal LAD and the circumflex.  The LAD was a      very diminutive atrophic, highly-diseased proximal vessel with 95%      tandem stenosis proximally and beyond the thin but long bifurcating SP-      I.  There was another 90%  stenosis between SP-I and the thin and long      SP-2, which both had antegrade flow.  The LAD was totally occluded just      following this at the junction of the proximal third, with no antegrade      filling.  2.  CIRCUMFLEX CORONARY  ARTERY:  The circumflex coronary artery had 70%      proximal, followed by a 90% focal and another tandem 95% lesion in the      proximal mid-portion.  Only one marginal was visualized antegrade, and      there was retrograde filling of the vein graft limb to this marginal      branch.  The marginal branch beyond the vein graft had no significant      stenosis.  3.  RIGHT CORONARY ARTERY:  The right coronary artery was totally occluded      in its very proximal portion just after a very proximal moderately-large      RV branch, that had no significant disease.  There was no antegrade      filling of the native right.  4.  SAPHENOUS VEIN GRAFT TO THE DISTAL RIGHT CORONARY ARTERY:  The saphenous      vein graft to the distal right coronary artery was widely patent.  It      had a tortuous bend in the proximal portion after the anastomosis, but      no significant disease, with an excellent anastomosis to the proximal      PDA and retrograde filling of a relatively small PLA, with an AV node      branch.  The PDA beyond the vein graft had a 90% mildly segmental      stenosis in its mid-portion.  5.  SAPHENOUS VEIN GRAFT TO THE FIRST DIAGONAL:  The saphenous vein graft to      the first diagonal was widely patent, with excellent flow and an      excellent anastomosis to the diagonal-I which was a small to moderate-      sized vessel that had no significant stenosis.  6.  SAPHENOUS VEIN GRAFT TO THE OBTUSE DIAGONAL:  There appeared to be a      near common ostia of the SVG to the OD, and the free RIMA to the OM.      The SVG to the OD was widely patent, excellent anastomosis to the OM      branch with retrograde filling of either a bifurcation of this large OD.      There was excellent filling of both branches through the patent graft.  7.  COMMON ORIGIN SAPHENOUS VEIN GRAFT TO THE DISTAL OBTUSE MARGINAL:  Was      widely patent with excellent anastomosis to the OM, as noted above, with  no  significant disease beyond.  8.  LEFT INTERNAL MAMMARY ARTERY:  The left internal mammary artery was      widely patent with an excellent anastomosis to the LAD, beyond the      diagonal-II branch.  There was excellent filling down to the apex, with      good flow and no significant LAD disease, and excellent retrograde      filling of the diagonal-II, which was moderate size and filled well.   LEFT VENTRICULAR ANGIOGRAM:  The left ventricular angiogram demonstrated  global hypokinesis.  The estimated ejection fraction was approximately 30%;  however, the patient was in atrial fibrillation with a rapid rate of 100, so  this may not be accurate and should be rechecked either invasively or non-  invasively in the future with sinus rhythm hopefully.  There was mild  hypokinesis of the mid-antral lateral and basilar inferior wall.  No  significant wall motion abnormality in the LAO projection.  No significant  MR.   ABDOMINAL ANGIOGRAM:  The abdominal angiogram demonstrated a 30% to 40%  concentric narrowing of the left renal artery with good flow and 20% to 30%  narrowing of the right renal artery with no trans-stenotic gradient on  selective injection.  There was a large superior, possibly left gastric  branch, arising from the proximal portion of the right renal artery.   DISCUSSION:  The patient has a very complicated coronary history.  He is now  69 years of age.  He has two children and four grandchildren.  He is a non-  smoker.  He has a remote history of increased ETOH and had rehabilitation at  [facility] one decade ago.  He still drinks beer on a regular basis, but  no hard liquor.  He has not been very compliant with follow-up visits, but  has felt well.  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  His history goes back to coronary disease being evident in 1990, with an  elective distal RCA percutaneous  transluminal coronary angiography.  He then  had progression of the disease, and had an emergency percutaneous  transluminal coronary angiography #2 on April 15, 1991, of the mid-RCA  during an acute myocardial infarction.  His third intervention was on Apr 22, 1991, when he had a left circumflex percutaneous transluminal coronary  angiography, RCA mid-PTCA for restenotic disease on Apr 22, 1991.  He also  had LAD mid and proximal percutaneous transluminal coronary angiographies  done at that time ([facility]).  He had restenosis of his mid-RCA, and on September 26, 1991, had a repeat dilatation of this.  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  He underwent an [facility]  with single vein graft to the RCA at that time.   He did well until the year 2000, when he was found to have progression of disease on cardiac catheterization after an SEMI, and underwent an elective  redo coronary artery bypass graft surgery with grafts as outlined above.   He now presents with two weeks of chest pain, characterized by substernal  burning and pressure, and intermittent palpitations.  He presents with  atrial flutter.  A myocardial infarction is ruled out by serial enzymes and  electrocardiograms.  He  has widely patent grafts as outlined above, but high-  grade stenosis of the PDA beyond the RCA graft.  Although there is a  relatively underfilling beyond the stenosis, this is a potential area for  ischemia.  It is not possible to say whether the atrial flutter is a primary  or a secondary problem, and this will be evaluated further later.  He is on  chronic aspirin, Plavix therapy and statins.   The patient is a candidate for PCI of the RCI as a possible culprit lesion,  with a complex anatomy as outlined above.   CATHETERIZATION DIAGNOSES:  1.  Unstable angina with associated atrial flutter  and rapid ventricular      response, new onset.  2.  Coronary artery disease with multiple remote prior interventions      1.  Percutaneous transluminal coronary angiography #1 was on January 14, 1989, of the distal right coronary artery.      2.  Percutaneous transluminal coronary angiography #2 on April 15, 1991,          with a total right coronary artery occlusion, emergency procedure,      3.  Percutaneous transluminal coronary angiography #3 on Apr 22, 1991, of          the right coronary artery-mid, mid-circumflex, mid-left anterior          descending coronary artery and proximal.      4.  Percutaneous transluminal coronary angiography #4 on September 26, 1991, right coronary artery-mid.      5.  Percutaneous transluminal coronary angiography #5, right coronary          artery-mid on March 06, 1992, complicated by dissection, requiring          [facility] in March 1993.      6.  Redo coronary artery bypass graft surgery on October 01, 1999, with          patent grafts on this study.  3.  High-grade posterior descending artery stenosis beyond the right      coronary artery graft from 1993, surgery.  4.  Left ventricular dysfunction, grade to be determined, when back in sinus      rhythm.  The estimated ejection fraction was 35%.  5.  Mild bilateral renal artery stenosis.  6.  Mild hypertension.  7.  Hyperlipidemia.  8.  History of remote ETOH.  9.  A nonsmoker.  10. LCEA, Dr. Orson Slick on April 14, 2001.      Rich   RAW/MEDQ  D:  12/17/2004  T:  12/17/2004  Job:  [id number]   cc:   Ernestina Penna, M.D.  [address] [place]  [place] [address]  Fax: [phone]

## 2011-05-09 NOTE — Discharge Summary (Signed)
NAME:  [name], [name]          ACCOUNT NO.:  [id number]   MEDICAL RECORD NO.:  [id number]          PATIENT TYPE:  OBV   LOCATION:  6531                         FACILITY:  [facility]   PHYSICIAN:  Richard A. Alanda Amass, M.D.DATE OF BIRTH:  1941/10/08   DATE OF ADMISSION:  12/16/2004  DATE OF DISCHARGE:  12/18/2004                                 DISCHARGE SUMMARY   DISCHARGE DIAGNOSIS:  1.  Unstable angina.  2.  Atrial flutter 2 to 1 conduction, resolved.  3.  Coronary artery disease with percutaneous transluminal coronary      angiography and stent deployment x 2 to the distal right coronary artery      beyond the insertion of the saphenous vein graft to the right coronary      artery with mini-Vision stent.      1.  History of previous interventions as well as coronary artery bypass          grafting in 1993 and redo in 2000.  4.  Dyslipidemia.  5.  Hypertension.  6.  Status post left carotid endarterectomy.  7.  History of cerebral aneurysm.  8.  Gout.  9.  Hypokalemia with replacement.  10. Ischemic cardiomyopathy.   CONDITION ON DISCHARGE:  Improved.   PROCEDURE:  1.  December 17, 2004, combined left heart catheterization and graft      visualization by Dr. Susa Griffins.  2.  December 17, 2004, PTCA and stent deployment to the RCA beyond the      insertion of the graft site.   CONSULTATIONS:  Dr. Launa Grill, EP consult for atrial flutter ablation.   DISCHARGE MEDICATIONS:  1.  Furosemide 40 mg daily as before.  2.  Plavix 75 mg 1 daily.  3.  Lovenox 100 mg subcu twice a day.  4.  Allopurinol 300 mg daily.  5.  Tenormin 50 mg daily.  6.  Enteric coated aspirin 2 daily, though once INR Is therapeutic, we will      stop the aspirin and continue on Plavix and Coumadin.  7.  Nexium 40 mg daily.  8.  Crestor 10 mg daily.  9.  Cardizem CD 180 mg daily.  10. Nitroglycerin sublingual p.r.n. chest pain.  11. Lanoxin 0.125 mg daily.  12. K-Dur 10 mEq daily.  13.  Coumadin 5 mg daily.   DISCHARGE INSTRUCTIONS:  1.  No strenuous activity for three days then may resume regular activities.  2.  Low fat, low salt diet.  3.  Wash cath site with soap and water, call us for any bleeding, swelling      or drainage.  4.  Have lab work done Friday morning early then call the office for result      around 3:30 so that we can make sure that we have levels.  5.  Follow up with Dr. Launa Grill on January 06, 2005, at 10 a.m.  6.  Follow up with Dr. Alanda Amass on December 27, 2004, at 2 p.m.   HISTORY OF PRESENT ILLNESS:  70 year old white male admitted to [facility] December 16, 2004, for chest pain and atrial flutter.  The patient  was admitted by Dr. Lemmie Evens on call for Dr. Alanda Amass.  The patient  has a history of coronary disease dating back to 1993, underwent bypass at  that point.  He also had a repeat coronary bypass grafting in 2000.  During  that period of time, he reportedly suffered several small MIs.  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  He described it as a vague burning and indigestion discomfort located  in the substernal region without radiation.  It was associated with dyspnea,  but no diaphoresis or nausea.  Discomfort continued throughout the course of  the day in a low grade fashion, though it did prompt his visit to the  emergency department.  In the ER, the patient was found to be in atrial  flutter with a 2 to 1 AV conduction treated with IV Adenosine and Diltiazem.  His rhythm converted to sinus rhythm.  He denied any dizziness, light  headedness, syncope, or near syncope, though he did have complaints of  fatigue on the day of admission.  The patient was admitted to [facility] and placed on Lovenox subcu which was eventually switched to  heparin as well as nitroglycerin.   PAST  MEDICAL HISTORY:  Coronary disease as described, hypertension,  dyslipidemia, gout, left carotid endarterectomy, and history of cerebral  aneurysm.   FAMILY HISTORY:  Strongly positive for coronary disease.   For social history and review of systems, see H&P.   ALLERGIES:  Intolerant to Lipitor.   OUTPATIENT MEDICATIONS:  Furosemide 40 daily, Crestor 10 daily, Plavix 75  daily, Allopurinol 300 daily, Nexium 40 daily, and Atenolol 25 daily.   PHYSICAL EXAMINATION AT DISCHARGE:  Blood pressure 122/72, pulse 78,  respirations 20, temperature 97.6.  Chest clear.  Heart:  Regular rate and  rhythm with a 1-2/6 short apical murmur.  Right groin without hematoma.   LABORATORY DATA:  Cardiac enzymes were negative, CK 115, MB 4.  Post  procedure troponin peaked at 0.13, CK 56, MB 2.7.  Prior to the procedure,  CKs ranged 93 with MB 3.8 and troponin 0.09.  CBC with admission hemoglobin  13.4, hematocrit 38.2, WBC 10.8, platelets 238, neutrophils 59, MCV 91.  Follow up hemoglobin 12.6, hematocrit 36, WBC 10.2, and platelets 223.  Chemistry on admission showed sodium 139, potassium 3.4, chloride 107, CO2  25, BUN 10, creatinine 1.2, glucose 123.  Remained stable, on the day of  discharge, potassium 3.3 and that was replaced.  Protime on admission 12.9,  INR 1, PTT 29.  AST 43, ALT 33, ALP 69, total bilirubin 0.6.  Magnesium 1.7.  UA negative.  A lipid panel was pending and TSH ranged from 0.9 to 1.5.  Chest x-ray on admission showed no significant abnormality.  2D echo was  done, results pending.  EKG on admission 2 to 1 A-flutter, left axis  deviation, possible inferior  MI.  Follow up on December 27 A-flutter with  variable AV block, heart rate 90s, prolonged QT, also diagnosed as acute MI.  Follow up on December 28, sinus rhythm, first degree AV block, PR 212  milliseconds, possible left atrial enlargement, left ventricular hypertrophy with QRS widening.  2D echo done on December 27 showed  left ventricular EF  estimated to be 25-35% with mild, diffuse left ventricular hypokinesis,  moderate hypokinesis of the septal  wall, left ventricular wall thickness was  mildly to moderately increased.  Aortic valve was moderately calcified,  there was low normal aortic valve leaflet excursions consistent with very  mild aortic valve stenosis.  Mild mitral annular calcification with mild  mitral valve regurgitation and at the time of this 2D echo, the patient was  in atrial flutter.   Cardiac catheterization December 17, 2004, please read Dr. Kandis Cocking cath  report, saphenous vein grafts were patent, he does have disease beyond the  insertion site, most significantly the PDA, and underwent stents to this  vessel beyond the insertion of the saphenous vein graft to the RCA.  The  patient tolerated the procedure, EF was also found to be 30%.   HOSPITAL COURSE:  Mr. Godlewski was admitted by Dr. Waldon Reining on call for Dr.  Alanda Amass who saw him in the ER on December 26 to a telemetry bed on  nitroglycerin and Lovenox subcu.  On the morning of December 27, he had  continued to be in and out of atrial flutter.  We were holding his Lovenox,  therefore, we went ahead and placed him on heparin and the patient was  prepared for cardiac catheterization.  Cardiac catheterization is as stated  above.  The plans would be to discharge him on Plavix, aspirin, Coumadin,  and Lovenox, once INR is therapeutic, we will discontinue the aspirin,  continue Plavix and Coumadin.  Dr. Severiano Gilbert, EP consultation, did see the  patient on December 28, felt adding digoxin would be beneficial with the EF  in the 30s, which we did, continue the Cardizem at 180.   On December 18, 2004, his heparin was discontinued, Lovenox was planned to  cross cover at home for Coumadin, administered by his daughter who is an  Astronomer.  Additionally, he will see Dr. Severiano Gilbert in 2-3 weeks for further  evaluation for possible A-flutter ablation.   On December 28, his Cardizem  was discontinued.  He had been given Cardizem CD 180 mg an hour prior to  discontinuing the drip.  The patient was ambulated and felt ready to  discharge.  Prior to discharge, he will receive 5 mg p.o. Coumadin as well  as Lovenox 100 mg subcu which he will continue at home.      Laur   LRI/MEDQ  D:  12/18/2004  T:  12/18/2004  Job:  [id number]   cc:   Janeece Riggers. Severiano Gilbert, M.D.  Fax: [phone]   Colon Flattery, D.O.  [address]  [place]  [place] [address]  Fax: [phone]

## 2011-05-09 NOTE — Cardiovascular Report (Signed)
NAMELUISALBERTO, BEEGLE             ACCOUNT NO.:  [id number]   MEDICAL RECORD NO.:  [id number]          PATIENT TYPE:  OIB   LOCATION:  2899                         FACILITY:  [facility]   PHYSICIAN:  Janeece Riggers. Severiano Gilbert, M.D.    DATE OF BIRTH:  [birth year]   DATE OF PROCEDURE:  01/23/2005  DATE OF DISCHARGE:                              CARDIAC CATHETERIZATION   PROCEDURE PERFORMED:  1.  Comprehensive electrophysiology study with arrhythmia induction.  2.  Left atrial pacing via coronary sinus.  3.  Catheter mapping of the tricuspid annulus for ablation.  4.  Intracardiac echocardiography.  5.  Right atrial angiogram for isthmus anatomy.  6.  Radiofrequency ablation of supraventricular tachycardia substrate.   PREPROCEDURE DIAGNOSIS:  Atrial flutter.   POSTPROCEDURE DIAGNOSIS:  Clockwise isthmus-dependent atrial flutter, status  post isthmus ablation.   OPERATOR:  Mark E. Severiano Gilbert, M.D.   COMPLICATIONS:  None.   ESTIMATED BLOOD LOSS:  Less than 30 mL.   PROCEDURE IN DETAIL:  The patient was brought to the EP lab in a fasting  state.  The patient's Coumadin had been withheld and his INR was less than  2.  Two 7-French sheaths were placed in the left femoral vein after local  anesthesia was obtained with 1% lidocaine injection.  An 8-French long  Convoy 55-degree sheath and a 7-French short sheath were placed in the right  femoral vein after topical anesthesia obtained with 1% lidocaine injection  subcutaneously.  The Convoy sheath was advanced to the superior vena cava.  A [employer] developer intracardiac echo probe was advanced  through the Convoy sheath and was used to image the right atrial anatomy  from the SVC junction to the IVC junction.  The findings are discussed  below.  The intracardiac echo probe was then removed.  A duodecapolar  catheter was advanced via fluoroscopy and placed along the lateral right  atrial wall.  The catheter position was near the crista  terminalis.  A  hexapolar catheter was advanced under fluoroscopic guidance and positioned  in the His bundle area.  A decapolar catheter was then positioned within the  proximal coronary sinus.  Finally, an EPT asymmetric 4, 8-mm tip, 8-French  thermal-sensing ablation catheter was advanced via the Convoy sheath into  the right atrium along the isthmus.  After determining, based on electrical  characteristics, the rhythm was normal sinus rhythm, bradycardic, at a cycle  length of 1158 msec with first-degree A-V block, P-R interval 258 msec.  There was a nonspecific intraventricular conduction delay with a QRS  duration of 124 msec.  The A-H interval was 131 msec, H-V interval 53 msec.  Pacing from the lateral right atrium wall as well as the proximal coronary  sinus demonstrated conduction across the isthmus in both directions.  Burst  pacing at atrial cycle lengths between 300 msec and 220 msec led to  induction of a counterclockwise macro-reentrant rhythm with a cycle length  of 250 msec, most consistent with isthmus atrial flutter.  The EPT ablation  catheter then was used to draw a single ablation line at approximately 6:30  in  the LAO projection from the tricuspid annulus to the junction of the  right atrial floor with the IVC.  The line was reinforced at 2 different  sites with fractionated breakthrough electrograms noted.  After completion  of the line, there were very nice split electrical atrial potentials along  the length.  Pacing from both the right lateral as well as the coronary  sinus demonstrated bidirectional conduction block across the isthmus.  Attempts to reinduce atrial flutter were unsuccessful with burst atrial  pacing.  At the conclusion of the study, the rhythm was normal sinus rhythm  with a cycle length of 1046 msec.  There was no change in his first-degree A-  V block or QRS duration.  His A-H interval was 138 msec, H-V interval 59  msec.  The antegrade ERP of  the A-V node was noted to be 350 msec off a  drive train of 161 msec.  The blocked cycle length for the A-V node  antegrade was noted to be less than 500 msec.  All sheaths and catheters  were then removed and hemostasis was obtained by direct pressure, after  waiting approximately 20 minutes for a return of isthmus conduction, which  was not present.  The patient tolerated the procedure well and was returned  to the holding area in stable hemodynamic condition.   Intracardiac echo demonstrated a moderate-sized SVC, prominent right atrial  appendage without clot, a thickened intra-atrial septum along the limbus,  but a very thin and intact foramen ovale.  The isthmus anatomy was  relatively uncomplicated with a short right atrial vestibule before entering  the right ventricle.  There was a prominent eustachian valve; however, the  eustachian ridge was not hypertrophied.  The floor of the right atrium as it  entered the IVC was relatively flat without angulation.      MEP/MEDQ  D:  01/23/2005  T:  01/23/2005  Job:  [id number]

## 2011-09-22 LAB — DIFFERENTIAL
Basophils Absolute: 0
Basophils Relative: 0
Basophils Relative: 1
Basophils Relative: 1
Eosinophils Absolute: 0
Eosinophils Absolute: 0.1
Eosinophils Absolute: 0.1
Eosinophils Relative: 2
Lymphs Abs: 0.5 — ABNORMAL LOW
Lymphs Abs: 1.4
Monocytes Absolute: 0.6
Monocytes Relative: 8
Monocytes Relative: 12
Neutro Abs: 6.7
Neutrophils Relative %: 58
Neutrophils Relative %: 85 — ABNORMAL HIGH
Neutrophils Relative %: 71

## 2011-09-22 LAB — POCT CARDIAC MARKERS
CKMB, poc: <1 — ABNORMAL LOW
CKMB, poc: 2.4
Troponin i, poc: <0.05

## 2011-09-22 LAB — CK TOTAL AND CKMB (NOT AT ARMC): CK, MB: 1.4

## 2011-09-22 LAB — POCT I-STAT, CHEM 8
BUN: 11
Chloride: 100
HCT: 44
Potassium: 3.4 — ABNORMAL LOW

## 2011-09-22 LAB — DIGOXIN LEVEL: Digoxin Level: 0.6 — ABNORMAL LOW

## 2011-09-22 LAB — BASIC METABOLIC PANEL
BUN: 11
BUN: 18
BUN: 24 — ABNORMAL HIGH
BUN: 25 — ABNORMAL HIGH
BUN: 28 — ABNORMAL HIGH
BUN: 10
CO2: 27
CO2: 32
Calcium: 8.9
Chloride: 100
Chloride: 94 — ABNORMAL LOW
Chloride: 99
Chloride: 98
Creatinine, Ser: 1.21
Creatinine, Ser: 1.72 — ABNORMAL HIGH
Creatinine, Ser: 2.16 — ABNORMAL HIGH
Creatinine, Ser: 1.13
GFR calc Af Amer: >60
GFR calc non Af Amer: 36 — ABNORMAL LOW
GFR calc non Af Amer: >60
Glucose, Bld: 108 — ABNORMAL HIGH
Glucose, Bld: 112 — ABNORMAL HIGH
Glucose, Bld: 97
Potassium: 3.2 — ABNORMAL LOW
Potassium: 4.1
Potassium: 3.3 — ABNORMAL LOW
Sodium: 132 — ABNORMAL LOW
Sodium: 136

## 2011-09-22 LAB — COMPREHENSIVE METABOLIC PANEL
BUN: 10
Calcium: 9.1
Chloride: 98
Creatinine, Ser: 1.06
GFR calc Af Amer: >60
GFR calc non Af Amer: >60
Glucose, Bld: 98
Potassium: 3.2 — ABNORMAL LOW
Sodium: 136

## 2011-09-22 LAB — CULTURE, BLOOD (ROUTINE X 2)

## 2011-09-22 LAB — CBC
HCT: 33.9 — ABNORMAL LOW
Hemoglobin: 12.1 — ABNORMAL LOW
MCHC: 33.5
MCHC: 32.7
MCV: 92.3
MCV: 92.5
MCV: 91.2
Platelets: 181
Platelets: 243
RBC: 3.89 — ABNORMAL LOW
RBC: 3.72 — ABNORMAL LOW
RDW: 14.8
WBC: 5.6
WBC: 6.5
WBC: 7.8

## 2011-09-22 LAB — B-NATRIURETIC PEPTIDE (CONVERTED LAB)
Pro B Natriuretic peptide (BNP): 1925 — ABNORMAL HIGH
Pro B Natriuretic peptide (BNP): 828 — ABNORMAL HIGH

## 2011-09-22 LAB — POCT I-STAT 3, VENOUS BLOOD GAS (G3P V)
Bicarbonate: 27 — ABNORMAL HIGH
TCO2: 28
pH, Ven: 7.484 — ABNORMAL HIGH
pO2, Ven: 39

## 2011-09-22 LAB — PROTIME-INR
INR: 1.7 — ABNORMAL HIGH
INR: 1.7 — ABNORMAL HIGH
INR: 2.1 — ABNORMAL HIGH
INR: 1.5
Prothrombin Time: 21 — ABNORMAL HIGH
Prothrombin Time: 21.1 — ABNORMAL HIGH
Prothrombin Time: 24.6 — ABNORMAL HIGH
Prothrombin Time: 26.1 — ABNORMAL HIGH
Prothrombin Time: 19.2 — ABNORMAL HIGH

## 2011-09-22 LAB — MAGNESIUM
Magnesium: 1.6
Magnesium: 1.8

## 2011-09-22 LAB — PHOSPHORUS: Phosphorus: 4.6

## 2011-09-22 LAB — APTT: aPTT: 36

## 2011-09-22 LAB — TROPONIN I: Troponin I: 0.03

## 2011-09-22 LAB — CARDIAC PANEL(CRET KIN+CKTOT+MB+TROPI): Relative Index: INVALID

## 2011-09-23 LAB — POCT I-STAT, CHEM 8
Chloride: 105
Creatinine, Ser: 1.4
Glucose, Bld: 127 — ABNORMAL HIGH
HCT: 33 — ABNORMAL LOW
Hemoglobin: 11.2 — ABNORMAL LOW
Potassium: 4
Sodium: 140

## 2011-09-23 LAB — DIFFERENTIAL
Basophils Relative: 0
Basophils Relative: 0
Eosinophils Absolute: 0.1
Eosinophils Absolute: 0.1
Eosinophils Relative: 2
Lymphs Abs: 1.4
Lymphs Abs: 1.5
Monocytes Absolute: 0.9
Monocytes Relative: 10
Neutrophils Relative %: 60

## 2011-09-23 LAB — HEPATIC FUNCTION PANEL
AST: 62 — ABNORMAL HIGH
Albumin: 3.1 — ABNORMAL LOW
Alkaline Phosphatase: 77
Bilirubin, Direct: 0.2
Total Bilirubin: 0.9

## 2011-09-23 LAB — COMPREHENSIVE METABOLIC PANEL
ALT: 43
ALT: 44
ALT: 46
ALT: 47
AST: 52 — ABNORMAL HIGH
AST: 58 — ABNORMAL HIGH
AST: 71 — ABNORMAL HIGH
Albumin: 3.1 — ABNORMAL LOW
Albumin: 3.1 — ABNORMAL LOW
Alkaline Phosphatase: 78
Alkaline Phosphatase: 79
BUN: 13
CO2: 29
CO2: 31
Calcium: 9.3
Calcium: 9.3
Chloride: 94 — ABNORMAL LOW
Creatinine, Ser: 1.62 — ABNORMAL HIGH
GFR calc Af Amer: 44 — ABNORMAL LOW
GFR calc Af Amer: 52 — ABNORMAL LOW
GFR calc Af Amer: >60
GFR calc non Af Amer: 60 — ABNORMAL LOW
GFR calc non Af Amer: >60
Glucose, Bld: 83
Potassium: 3.5
Potassium: 3.5
Sodium: 135
Sodium: 135
Sodium: 136
Sodium: 140
Total Bilirubin: 0.6
Total Bilirubin: 1.2
Total Protein: 6.6
Total Protein: 6.8

## 2011-09-23 LAB — CBC
HCT: 30.5 — ABNORMAL LOW
HCT: 34.5 — ABNORMAL LOW
HCT: 36 — ABNORMAL LOW
Hemoglobin: 10.7 — ABNORMAL LOW
Hemoglobin: 11.7 — ABNORMAL LOW
Hemoglobin: 12 — ABNORMAL LOW
MCHC: 32.8
MCHC: 33
MCHC: 33.4
MCHC: 34
MCV: 89.1
MCV: 89.9
MCV: 90.9
Platelets: 184
RBC: 3.62 — ABNORMAL LOW
RBC: 3.94 — ABNORMAL LOW
RBC: 3.96 — ABNORMAL LOW
RDW: 16.2 — ABNORMAL HIGH
RDW: 16.4 — ABNORMAL HIGH
WBC: 5.9
WBC: 9.7

## 2011-09-23 LAB — BASIC METABOLIC PANEL
CO2: 30
CO2: 33 — ABNORMAL HIGH
CO2: 34 — ABNORMAL HIGH
Calcium: 9.3
Calcium: 9.5
Chloride: 101
Chloride: 93 — ABNORMAL LOW
Chloride: 97
Chloride: 99
Creatinine, Ser: 1.75 — ABNORMAL HIGH
GFR calc Af Amer: 48 — ABNORMAL LOW
GFR calc Af Amer: >60
GFR calc Af Amer: >60
GFR calc non Af Amer: 41 — ABNORMAL LOW
Glucose, Bld: 101 — ABNORMAL HIGH
Glucose, Bld: 96
Potassium: 2.9 — ABNORMAL LOW
Potassium: 3.7
Potassium: 3.8
Sodium: 130 — ABNORMAL LOW
Sodium: 140
Sodium: 140

## 2011-09-23 LAB — CARDIAC PANEL(CRET KIN+CKTOT+MB+TROPI)
CK, MB: 1
Relative Index: INVALID
Relative Index: INVALID
Total CK: 32
Troponin I: 0.02
Troponin I: 0.04

## 2011-09-23 LAB — DIGOXIN LEVEL: Digoxin Level: 0.3 — ABNORMAL LOW

## 2011-09-23 LAB — PROTIME-INR
INR: 1.6 — ABNORMAL HIGH
Prothrombin Time: 22.7 — ABNORMAL HIGH

## 2011-09-23 LAB — APTT: aPTT: 40 — ABNORMAL HIGH

## 2011-09-23 LAB — B-NATRIURETIC PEPTIDE (CONVERTED LAB)
Pro B Natriuretic peptide (BNP): 319 — ABNORMAL HIGH
Pro B Natriuretic peptide (BNP): 527 — ABNORMAL HIGH

## 2011-09-23 LAB — MAGNESIUM: Magnesium: 1.9

## 2011-09-23 LAB — POCT CARDIAC MARKERS
CKMB, poc: <1 — ABNORMAL LOW
Myoglobin, poc: 50.1
Troponin i, poc: <0.05

## 2011-09-23 LAB — HEPARIN LEVEL (UNFRACTIONATED): Heparin Unfractionated: 0.38

## 2011-09-29 LAB — PROTIME-INR
INR: 1.9 — ABNORMAL HIGH
INR: 2 — ABNORMAL HIGH
INR: 2 — ABNORMAL HIGH
INR: 2 — ABNORMAL HIGH
INR: 2.1 — ABNORMAL HIGH
Prothrombin Time: 22.3 — ABNORMAL HIGH
Prothrombin Time: 22.9 — ABNORMAL HIGH
Prothrombin Time: 23.3 — ABNORMAL HIGH
Prothrombin Time: 23.7 — ABNORMAL HIGH

## 2011-09-29 LAB — COMPREHENSIVE METABOLIC PANEL
ALT: 66 — ABNORMAL HIGH
ALT: 72 — ABNORMAL HIGH
AST: 80 — ABNORMAL HIGH
Albumin: 3.3 — ABNORMAL LOW
Albumin: 3.6
Albumin: 3.7
Alkaline Phosphatase: 71
Alkaline Phosphatase: 74
BUN: 26 — ABNORMAL HIGH
BUN: 26 — ABNORMAL HIGH
BUN: 26 — ABNORMAL HIGH
CO2: 27
Calcium: 8.9
Calcium: 9.6
Calcium: 9.7
Creatinine, Ser: 1.43
GFR calc Af Amer: 52 — ABNORMAL LOW
GFR calc Af Amer: >60
GFR calc non Af Amer: 43 — ABNORMAL LOW
Glucose, Bld: 107 — ABNORMAL HIGH
Glucose, Bld: 173 — ABNORMAL HIGH
Potassium: 3.8
Potassium: 4
Potassium: 4.2
Sodium: 137
Total Protein: 6.7
Total Protein: 6.8
Total Protein: 7.1
Total Protein: 7.4

## 2011-09-29 LAB — B-NATRIURETIC PEPTIDE (CONVERTED LAB)
Pro B Natriuretic peptide (BNP): 151 — ABNORMAL HIGH
Pro B Natriuretic peptide (BNP): 178 — ABNORMAL HIGH
Pro B Natriuretic peptide (BNP): 216 — ABNORMAL HIGH
Pro B Natriuretic peptide (BNP): 835 — ABNORMAL HIGH

## 2011-09-29 LAB — BASIC METABOLIC PANEL
Calcium: 9.4
Creatinine, Ser: 1.01
GFR calc Af Amer: >60

## 2011-09-29 LAB — CBC
MCHC: 34
MCV: 87
Platelets: 240
RBC: 4.4
RBC: 4.45
WBC: 7
WBC: 7.3

## 2011-09-29 LAB — ETHANOL: Alcohol, Ethyl (B): <5

## 2011-09-29 LAB — MAGNESIUM: Magnesium: 1.8

## 2011-09-29 LAB — CK TOTAL AND CKMB (NOT AT ARMC)
Relative Index: INVALID
Total CK: 84

## 2011-09-29 LAB — URIC ACID: Uric Acid, Serum: 6.3

## 2011-10-01 LAB — BASIC METABOLIC PANEL
CO2: 24
Calcium: 8.2 — ABNORMAL LOW
Creatinine, Ser: 0.89
GFR calc Af Amer: >60
GFR calc non Af Amer: >60
Sodium: 127 — ABNORMAL LOW

## 2011-10-01 LAB — CBC
HCT: 38.2 — ABNORMAL LOW
Hemoglobin: 12.8 — ABNORMAL LOW
MCHC: 33.6
MCV: 88.4
Platelets: 221
RBC: 4.32
RDW: 13.9
WBC: 7.1

## 2011-10-01 LAB — DIFFERENTIAL
Basophils Absolute: 0
Basophils Relative: 0
Eosinophils Absolute: 0
Eosinophils Relative: 0
Lymphocytes Relative: 13
Lymphs Abs: 0.9
Monocytes Absolute: 0.2
Monocytes Relative: 3
Neutro Abs: 5.9
Neutrophils Relative %: 83 — ABNORMAL HIGH

## 2011-10-01 LAB — BASIC METABOLIC PANEL WITH GFR
BUN: 9
Chloride: 92 — ABNORMAL LOW
Glucose, Bld: 121 — ABNORMAL HIGH
Potassium: 3.3 — ABNORMAL LOW

## 2011-10-01 LAB — PROTIME-INR
INR: 1.9 — ABNORMAL HIGH
Prothrombin Time: 22.6 — ABNORMAL HIGH

## 2011-10-01 LAB — ETHANOL: Alcohol, Ethyl (B): 213 — ABNORMAL HIGH

## 2011-10-02 LAB — CBC
HCT: 36.9 — ABNORMAL LOW
HCT: 38.9 — ABNORMAL LOW
HCT: 41.2
HCT: 44.9
Hemoglobin: 11.7 — ABNORMAL LOW
Hemoglobin: 12.6 — ABNORMAL LOW
Hemoglobin: 13.8
MCHC: 33.5
MCV: 90.9
MCV: 92.9
Platelets: 206
Platelets: 266
Platelets: 267
Platelets: 276
RBC: 3.78 — ABNORMAL LOW
RBC: 4.03 — ABNORMAL LOW
RBC: 4.05 — ABNORMAL LOW
RBC: 4.19 — ABNORMAL LOW
RBC: 4.44
RDW: 13.3
RDW: 13.4
RDW: 13.4
RDW: 13.6
WBC: 10.2
WBC: 11.1 — ABNORMAL HIGH
WBC: 7.5
WBC: 7.8
WBC: 8.1

## 2011-10-02 LAB — URINALYSIS, ROUTINE W REFLEX MICROSCOPIC
Glucose, UA: NEGATIVE
Hgb urine dipstick: NEGATIVE
Specific Gravity, Urine: 1.015
Urobilinogen, UA: 0.2
pH: 5.5

## 2011-10-02 LAB — COMPREHENSIVE METABOLIC PANEL
ALT: 57 — ABNORMAL HIGH
AST: 29
Albumin: 3.1 — ABNORMAL LOW
Alkaline Phosphatase: 58
Alkaline Phosphatase: 66
BUN: 12
CO2: 25
CO2: 26
Chloride: 104
Chloride: 106
GFR calc Af Amer: >60
GFR calc non Af Amer: >60
Glucose, Bld: 124 — ABNORMAL HIGH
Potassium: 3.4 — ABNORMAL LOW
Potassium: 3.5
Total Bilirubin: 0.6
Total Bilirubin: 0.8
Total Protein: 8.2

## 2011-10-02 LAB — HEPARIN LEVEL (UNFRACTIONATED)
Heparin Unfractionated: 0.15 — ABNORMAL LOW
Heparin Unfractionated: 0.33
Heparin Unfractionated: 0.51
Heparin Unfractionated: 0.55
Heparin Unfractionated: 0.71 — ABNORMAL HIGH

## 2011-10-02 LAB — BASIC METABOLIC PANEL
BUN: 15
CO2: 26
CO2: 26
Calcium: 9
Calcium: 9.3
Chloride: 103
Chloride: 104
Chloride: 106
Chloride: 96
Creatinine, Ser: 0.87
Creatinine, Ser: 0.95
Creatinine, Ser: 1.35
GFR calc Af Amer: >60
GFR calc Af Amer: >60
GFR calc Af Amer: >60
GFR calc Af Amer: >60
GFR calc non Af Amer: >60
GFR calc non Af Amer: >60
Potassium: 3.1 — ABNORMAL LOW
Potassium: 3.4 — ABNORMAL LOW
Sodium: 138
Sodium: 140
Sodium: 140

## 2011-10-02 LAB — DIFFERENTIAL
Basophils Absolute: 0
Basophils Relative: 0
Eosinophils Absolute: 0.2
Eosinophils Relative: 3
Monocytes Absolute: 0.9 — ABNORMAL HIGH

## 2011-10-02 LAB — PROTIME-INR
INR: 1
INR: 1
INR: 1.3
Prothrombin Time: 13.1
Prothrombin Time: 16.9 — ABNORMAL HIGH

## 2011-10-02 LAB — MAGNESIUM: Magnesium: 1.5

## 2011-10-02 LAB — B-NATRIURETIC PEPTIDE (CONVERTED LAB)
Pro B Natriuretic peptide (BNP): 311 — ABNORMAL HIGH
Pro B Natriuretic peptide (BNP): 487 — ABNORMAL HIGH

## 2011-10-03 LAB — COMPREHENSIVE METABOLIC PANEL
ALT: 18
AST: 26
Albumin: 3.2 — ABNORMAL LOW
Calcium: 8.8
Creatinine, Ser: 1.37
GFR calc Af Amer: >60
Sodium: 138
Total Protein: 6.7

## 2011-10-03 LAB — APTT: aPTT: 32

## 2011-10-03 LAB — CBC
HCT: 34.9 — ABNORMAL LOW
HCT: 35.3 — ABNORMAL LOW
HCT: 36.4 — ABNORMAL LOW
Hemoglobin: 12.1 — ABNORMAL LOW
Hemoglobin: 12.1 — ABNORMAL LOW
Hemoglobin: 12.6 — ABNORMAL LOW
MCHC: 34.3
MCHC: 34.6
MCV: 90.7
MCV: 91
MCV: 91.7
Platelets: 225
Platelets: 232
Platelets: 237
RBC: 3.85 — ABNORMAL LOW
RDW: 13.2
RDW: 13.7
WBC: 7.8
WBC: 7.9
WBC: 8.9

## 2011-10-03 LAB — BASIC METABOLIC PANEL
BUN: 11
Calcium: 9.2
Creatinine, Ser: 1.06
GFR calc non Af Amer: >60
Glucose, Bld: 109 — ABNORMAL HIGH

## 2011-10-03 LAB — POCT I-STAT 3, ART BLOOD GAS (G3+)
Bicarbonate: 25.5 — ABNORMAL HIGH
O2 Saturation: 97
TCO2: 27
pCO2 arterial: 41.2
pH, Arterial: 7.4

## 2011-10-03 LAB — I-STAT 8, (EC8 V) (CONVERTED LAB)
Acid-base deficit: 4 — ABNORMAL HIGH
Chloride: 104
Hemoglobin: 13.3
Potassium: 3.8
Sodium: 136
TCO2: 23

## 2011-10-03 LAB — POCT CARDIAC MARKERS
Myoglobin, poc: 68.6
Operator id: 270111
Operator id: 270111

## 2011-10-03 LAB — POCT I-STAT 3, VENOUS BLOOD GAS (G3P V)
Acid-base deficit: 1
Bicarbonate: 25 — ABNORMAL HIGH
O2 Saturation: 63
Operator id: 118911
TCO2: 26
pO2, Ven: 36

## 2011-10-03 LAB — LIPID PANEL
Cholesterol: 148
LDL Cholesterol: 42
VLDL: 74 — ABNORMAL HIGH

## 2011-10-03 LAB — POCT I-STAT CREATININE
Creatinine, Ser: 1.5
Operator id: 270111

## 2011-10-03 LAB — CK TOTAL AND CKMB (NOT AT ARMC): CK, MB: 1.4

## 2011-10-03 LAB — HEPARIN LEVEL (UNFRACTIONATED): Heparin Unfractionated: 0.75 — ABNORMAL HIGH

## 2011-10-03 LAB — PROTIME-INR: INR: 1

## 2011-10-03 LAB — TROPONIN I: Troponin I: 0.03

## 2012-01-15 ENCOUNTER — Encounter: Payer: Self-pay | Admitting: Internal Medicine

## 2012-02-26 ENCOUNTER — Encounter: Payer: Self-pay | Admitting: Internal Medicine

## 2012-05-03 ENCOUNTER — Encounter: Payer: Self-pay | Admitting: Internal Medicine

## 2012-05-26 ENCOUNTER — Encounter: Payer: Self-pay | Admitting: Cardiology

## 2012-05-26 ENCOUNTER — Encounter: Payer: Self-pay | Admitting: Internal Medicine

## 2012-05-26 ENCOUNTER — Ambulatory Visit (INDEPENDENT_AMBULATORY_CARE_PROVIDER_SITE_OTHER): Payer: Medicare Other | Admitting: Cardiology

## 2012-05-26 VITALS — BP 140/87 | HR 73 | Ht 73.0 in | Wt 195.4 lb

## 2012-05-26 DIAGNOSIS — Z95810 Presence of automatic (implantable) cardiac defibrillator: Secondary | ICD-10-CM

## 2012-05-26 DIAGNOSIS — I2589 Other forms of chronic ischemic heart disease: Secondary | ICD-10-CM

## 2012-05-26 LAB — ICD DEVICE OBSERVATION
AL IMPEDENCE ICD: 552 Ohm
AL THRESHOLD: 1 V
LV LEAD IMPEDENCE ICD: 616 Ohm
LV LEAD THRESHOLD: 1 V
RV LEAD AMPLITUDE: 8.9 mv
RV LEAD IMPEDENCE ICD: 440 Ohm
RV LEAD THRESHOLD: 1 V
TZON-0003FASTVT: 250 ms

## 2012-05-26 NOTE — Progress Notes (Signed)
 ELECTROPHYSIOLOGY OFFICE NOTE  Patient ID: [name] MRN: [id number], DOB/AGE: 07/22/1941   Date of Visit: 05/26/2012  Primary Cardiologist: Dr. Richard Weintraub Reason for Visit: Device follow-up  History of Present Illness [name] is a pleasant 71 year old gentleman with an ischemic cardiomyopathy s/p BiV ICD implantation, CAD s/p CABG 1993, redo CABG 2000, AS s/p AVR 2010, PVD, HTN and chronic renal insufficiency who presents today for device follow-up, referred by Dr. Weintraub for generator change as his battery is now at [facility]. [name] reports he has been feeling well and he has no complaints. He denies chest pain, shortness of breath, palpitations, dizziness or syncope. He denies pedal edema or abdominal swelling, orthopnea or PND.   Past Medical History  Diagnosis Date  . CAD        s/p CABG 1993, redo CABG 2000, LHC in 2010 shows patent grafts   . AS s/p AVR May 2010   . Chronic renal insufficiency   . Hypertension   . Hyperlipidemia   . 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 History of atrial flutter     ablation in sinus rhythm   . GERD (gastroesophageal reflux disease)     with gas and bloating probably cause of chest pain  . Ischemic cardiomyopathy     EF of 30% s/p BiV ICD implantation 2008 (Medtronic)         Echo done August 2010, EF 20-25%   . Gout     Past Surgical History  Procedure Date  . Coronary artery bypass graft 1993    redo in 2000, he has had multiple MI previous to both procedures.  . Cardiac catheterization 2004, 2006, 2010    with a presentation of atrial flutter with rapid ventricular response in 2004. He had a cath and two stents to his PDA after his SVG to his RCA. His other graft were patent. He had LV with EF of 30%.   . Carotid endarterectomy     left in December 2003 and know renal artery stenosis of 60-70%    Allergies/Intolerances Allergies  Allergen Reactions  .  Atorvastatin    Current Home Medications Current Outpatient Prescriptions  Medication Sig Dispense Refill  . allopurinol (ZYLOPRIM) 300 MG tablet Take 300 mg by mouth daily.      . aspirin 81 MG tablet Take 81 mg by mouth daily.      . atenolol (TENORMIN) 50 MG tablet Take 50 mg by mouth daily.      . clopidogrel (PLAVIX) 75 MG tablet Take 75 mg by mouth daily.      . digoxin (LANOXIN) 0.125 MG tablet Take 125 mcg by mouth daily.      . diltiazem (DILACOR XR) 180 MG 24 hr capsule Take 180 mg by mouth daily.      . enalapril (VASOTEC) 2.5 MG tablet Take 2.5 mg by mouth daily.      . furosemide (LASIX) 20 MG tablet Take 20 mg by mouth daily.      . nitroGLYCERIN (NITROSTAT) 0.4 MG SL tablet Place 0.4 mg under the tongue every 5 (five) minutes as needed.      . pantoprazole (PROTONIX) 40 MG tablet Take 40 mg by mouth daily.      . Potassium Chloride (KLOR-CON 10 PO) Take 10 mEq by mouth daily.      . rosuvastatin (CRESTOR) 10 MG tablet Take 10 mg by mouth   daily.       Social History Social History  . Marital Status: Married    Spouse Name: N/A    Number of Children: N/A  . Years of Education: N/A   Social History Main Topics  . Smoking status: Former Smoker  . Smokeless tobacco: Not on file  . Alcohol Use: Not on file  . Drug Use: Not on file  . Sexually Active: Not on file   Review of Systems General:  No chills, fever, night sweats or weight changes Cardiovascular:  No chest pain, dyspnea on exertion, edema, orthopnea, palpitations, paroxysmal nocturnal dyspnea Dermatological: No rash, lesions or masses Respiratory: No cough, dyspnea Urologic: No hematuria, dysuria Abdominal:   No nausea, vomiting, diarrhea, bright red blood per rectum, melena, or hematemesis Neurologic:  No visual changes, weakness, changes in mental status All other systems reviewed and are otherwise negative except as noted above.  Physical Exam Blood pressure 140/87, pulse 73, height 6' 1" (1.854 m),  weight 195 lb 6.4 oz (88.633 kg).  General: Well developed, well appearing 71 year old male, in no acute distress. HEENT: Normocephalic, atraumatic. EOMs intact. Sclera nonicteric. Oropharynx clear.  Neck: Supple without bruits. No JVD. Lungs:  Respirations regular and unlabored, CTA bilaterally. No wheezes, rales or rhonchi. Heart: RRR. S1, S2 present. No murmurs, rub, S3 or S4. Abdomen: Soft, non-distended. Extremities: No clubbing, cyanosis or edema. DP/PT/Radials 2+ and equal bilaterally. Psych: Normal affect. Neuro: Alert and oriented X 3. Moves all extremities spontaneously.   Diagnostics/Studies 12-lead ECG shows AV pacing at 70 bpm Device interrogation today shows normal BiV ICD function with stable lead parameters; battery at [facility] (2.62V); there were no episodes; no programming changes made; see PaceArt report  Assessment and Plan 1.  BiV ICD battery at [facility] - will need generator change in the next few weeks; will repeat an echocardiogram (if not already ordered by Dr. Weintraub) to assess LV function prior to change out. If his LV function has improved, will plan for change out to BiV PPM; if his LV function remains <35%, he will need generator change for BiV ICD 2.  Ischemic cardiomyopathy - patient appears euvolemic by exam today; followed by Dr. Weintraub  This plan of care was formulated with Dr. Gregg Taylor who was in to see the patient with me. Signed, Ragan Duhon, PA-C 05/26/2012, 12:57 PM    

## 2012-05-28 ENCOUNTER — Telehealth: Payer: Self-pay | Admitting: *Deleted

## 2012-05-28 DIAGNOSIS — I428 Other cardiomyopathies: Secondary | ICD-10-CM

## 2012-05-28 NOTE — Telephone Encounter (Signed)
Left message for patient to call to schedule CRT-D generator change with Dr [name].  Pt had echo post valve replacement and EF is still depressed.  No need to repeat per Dr [name].

## 2012-06-04 NOTE — Telephone Encounter (Signed)
Left message for patient to call to schedule generator change.

## 2012-06-07 NOTE — Telephone Encounter (Signed)
Called patient back, no answer, left message on machine.  Offered dates of 7-5, 53-18, 7-19, or 7-31 for ICD gen change.

## 2012-06-07 NOTE — Telephone Encounter (Signed)
F/u on previous call:  Returning call back to [facility] .

## 2012-06-08 NOTE — Telephone Encounter (Signed)
F/U on previous call:  Patient decided on 7/5 form ICD Gen change.

## 2012-06-08 NOTE — Telephone Encounter (Signed)
Spoke with patient. ICD gen change scheduled for Friday, June 25, 2012 at 3:30PM with Dr [name]. Patient will have labs drawn on 6-26 or 6-27 at [address] office. Instruction sheet mailed to patient.

## 2012-06-09 ENCOUNTER — Encounter: Payer: Self-pay | Admitting: *Deleted

## 2012-06-09 ENCOUNTER — Other Ambulatory Visit: Payer: Self-pay | Admitting: *Deleted

## 2012-06-09 DIAGNOSIS — I428 Other cardiomyopathies: Secondary | ICD-10-CM

## 2012-06-16 ENCOUNTER — Other Ambulatory Visit: Payer: Medicare Other

## 2012-06-22 ENCOUNTER — Other Ambulatory Visit (INDEPENDENT_AMBULATORY_CARE_PROVIDER_SITE_OTHER): Payer: Medicare Other

## 2012-06-22 ENCOUNTER — Encounter (HOSPITAL_COMMUNITY): Payer: Self-pay | Admitting: Pharmacy Technician

## 2012-06-22 DIAGNOSIS — I428 Other cardiomyopathies: Secondary | ICD-10-CM

## 2012-06-22 LAB — CBC WITH DIFFERENTIAL/PLATELET
Basophils Relative: 1 % (ref 0.0–3.0)
HCT: 38.2 % — ABNORMAL LOW (ref 39.0–52.0)
Hemoglobin: 12.8 g/dL — ABNORMAL LOW (ref 13.0–17.0)
MCHC: 33.5 g/dL (ref 30.0–36.0)
Monocytes Absolute: 1 10*3/uL (ref 0.1–1.0)
RBC: 4.04 Mil/uL — ABNORMAL LOW (ref 4.22–5.81)
RDW: 15.9 % — ABNORMAL HIGH (ref 11.5–14.6)

## 2012-06-22 LAB — BASIC METABOLIC PANEL
CO2: 28 mEq/L (ref 19–32)
Chloride: 91 mEq/L — ABNORMAL LOW (ref 96–112)
Glucose, Bld: 100 mg/dL — ABNORMAL HIGH (ref 70–99)
Potassium: 4.1 mEq/L (ref 3.5–5.1)
Sodium: 130 mEq/L — ABNORMAL LOW (ref 135–145)

## 2012-06-23 ENCOUNTER — Other Ambulatory Visit: Payer: Self-pay | Admitting: *Deleted

## 2012-06-23 DIAGNOSIS — I428 Other cardiomyopathies: Secondary | ICD-10-CM

## 2012-06-24 MED ORDER — CEFAZOLIN SODIUM-DEXTROSE 2-3 GM-% IV SOLR
2.0000 g | INTRAVENOUS | Status: DC
  Filled 2012-06-24: qty 50

## 2012-06-24 MED ORDER — SODIUM CHLORIDE 0.9 % IR SOLN
80.0000 mg | Status: DC
  Filled 2012-06-24: qty 2

## 2012-06-25 ENCOUNTER — Encounter (HOSPITAL_COMMUNITY)
Admission: RE | Disposition: A | Discharge status: Home / Self Care | Payer: Self-pay | Source: Ambulatory Visit | Attending: Internal Medicine

## 2012-06-25 ENCOUNTER — Ambulatory Visit (HOSPITAL_COMMUNITY): Payer: Medicare Other

## 2012-06-25 ENCOUNTER — Ambulatory Visit (HOSPITAL_COMMUNITY)
Admission: RE | Admit: 2012-06-25 | Discharge: 2012-06-25 | Disposition: A | Discharge status: Home / Self Care | Payer: Medicare Other | Source: Ambulatory Visit | Attending: Internal Medicine | Admitting: Internal Medicine

## 2012-06-25 DIAGNOSIS — T827XXA Infection and inflammatory reaction due to other cardiac and vascular devices, implants and grafts, initial encounter: Secondary | ICD-10-CM

## 2012-06-25 DIAGNOSIS — Z5309 Procedure and treatment not carried out because of other contraindication: Secondary | ICD-10-CM | POA: Insufficient documentation

## 2012-06-25 DIAGNOSIS — I428 Other cardiomyopathies: Secondary | ICD-10-CM

## 2012-06-25 DIAGNOSIS — Z45018 Encounter for adjustment and management of other part of cardiac pacemaker: Secondary | ICD-10-CM | POA: Insufficient documentation

## 2012-06-25 HISTORY — PX: IMPLANTABLE CARDIOVERTER DEFIBRILLATOR (ICD) GENERATOR CHANGE: SHX5469

## 2012-06-25 LAB — SURGICAL PCR SCREEN: Staphylococcus aureus: NEGATIVE

## 2012-06-25 SURGERY — ICD GENERATOR CHANGE
Anesthesia: LOCAL

## 2012-06-25 MED ORDER — SODIUM CHLORIDE 0.9 % IV SOLN: 250.0000 mL | INTRAVENOUS | Status: DC

## 2012-06-25 MED ORDER — SODIUM CHLORIDE 0.9 % IJ SOLN: 3.0000 mL | INTRAMUSCULAR | Status: DC | PRN

## 2012-06-25 MED ORDER — MUPIROCIN 2 % EX OINT
TOPICAL_OINTMENT | Freq: Once | CUTANEOUS | Status: AC
  Administered 2012-06-25: 1 via NASAL
  Filled 2012-06-25: qty 22

## 2012-06-25 MED ORDER — CHLORHEXIDINE GLUCONATE 4 % EX LIQD
60.0000 mL | Freq: Once | CUTANEOUS | Status: DC
  Filled 2012-06-25: qty 60

## 2012-06-25 MED ORDER — MIDAZOLAM HCL 5 MG/5ML IJ SOLN
INTRAMUSCULAR | Status: AC
  Filled 2012-06-25: qty 5

## 2012-06-25 MED ORDER — LIDOCAINE HCL (PF) 1 % IJ SOLN
INTRAMUSCULAR | Status: AC
  Filled 2012-06-25: qty 60

## 2012-06-25 MED ORDER — SODIUM CHLORIDE 0.45 % IV SOLN
INTRAVENOUS | Status: DC
  Administered 2012-06-25: 15:00:00 via INTRAVENOUS

## 2012-06-25 MED ORDER — ONDANSETRON HCL 4 MG/2ML IJ SOLN: 4.0000 mg | Freq: Four times a day (QID) | INTRAMUSCULAR | Status: DC | PRN

## 2012-06-25 MED ORDER — SODIUM CHLORIDE 0.9 % IJ SOLN: 3.0000 mL | Freq: Two times a day (BID) | INTRAMUSCULAR | Status: DC

## 2012-06-25 MED ORDER — FENTANYL CITRATE 0.05 MG/ML IJ SOLN
INTRAMUSCULAR | Status: AC
  Filled 2012-06-25: qty 2

## 2012-06-25 MED ORDER — HYDROCODONE-ACETAMINOPHEN 5-325 MG PO TABS: 1.0000 | ORAL_TABLET | ORAL | Status: DC | PRN

## 2012-06-25 MED ORDER — ACETAMINOPHEN 325 MG PO TABS: 325.0000 mg | ORAL_TABLET | ORAL | Status: DC | PRN

## 2012-06-25 MED ORDER — CLINDAMYCIN HCL 300 MG PO CAPS: 300.0000 mg | ORAL_CAPSULE | Freq: Three times a day (TID) | ORAL | Status: DC

## 2012-06-25 NOTE — H&P (View-Only) (Signed)
ELECTROPHYSIOLOGY OFFICE NOTE  Patient ID: [name] MRN: [id number], DOB/AGE: 1941/04/05   Date of Visit: 05/26/2012  Primary Cardiologist: Dr. Susa Griffins Reason for Visit: Device follow-up  History of Present Illness [name] is a pleasant 71 year old gentleman with an ischemic cardiomyopathy s/p BiV ICD implantation, CAD s/p CABG 1993, redo CABG 2000, AS s/p AVR 2010, PVD, HTN and chronic renal insufficiency who presents today for device follow-up, referred by Dr. Alanda Amass for generator change as his battery is now at [facility]. Mr. Marcotte reports he has been feeling well and he has no complaints. He denies chest pain, shortness of breath, palpitations, dizziness or syncope. He denies pedal edema or abdominal swelling, orthopnea or PND.   Past Medical History  Diagnosis Date  . CAD        s/p CABG 1993, redo CABG 2000, LHC in 2010 shows patent grafts   . AS s/p AVR May 2010   . Chronic renal insufficiency   . Hypertension   . Hyperlipidemia   . 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 History of atrial flutter     ablation in sinus rhythm   . GERD (gastroesophageal reflux disease)     with gas and bloating probably cause of chest pain  . Ischemic cardiomyopathy     EF of 30% s/p BiV ICD implantation 2008 ([employer]tronic)         Echo done August 2010, EF 20-25%   . Gout     Past Surgical History  Procedure Date  . Coronary artery bypass graft 1993    redo in 2000, he has had multiple MI previous to both procedures.  . Cardiac catheterization 2004, 2006, 2010    with a presentation of atrial flutter with rapid ventricular response in 2004. He had a cath and two stents to his PDA after his SVG to his RCA. His other graft were patent. He had LV with EF of 30%.   . Carotid endarterectomy     left in December 2003 and know renal artery stenosis of 60-70%    Allergies/Intolerances Allergies  Allergen Reactions  .  Atorvastatin    Current Home Medications Current Outpatient Prescriptions  Medication Sig Dispense Refill  . allopurinol (ZYLOPRIM) 300 MG tablet Take 300 mg by mouth daily.      [name] aspirin 81 MG tablet Take 81 mg by mouth daily.      [name] atenolol (TENORMIN) 50 MG tablet Take 50 mg by mouth daily.      . clopidogrel (PLAVIX) 75 MG tablet Take 75 mg by mouth daily.      . digoxin (LANOXIN) 0.125 MG tablet Take 125 mcg by mouth daily.      [name] diltiazem (DILACOR XR) 180 MG 24 hr capsule Take 180 mg by mouth daily.      . enalapril (VASOTEC) 2.5 MG tablet Take 2.5 mg by mouth daily.      . furosemide (LASIX) 20 MG tablet Take 20 mg by mouth daily.      . nitroGLYCERIN (NITROSTAT) 0.4 MG SL tablet Place 0.4 mg under the tongue every 5 (five) minutes as needed.      . pantoprazole (PROTONIX) 40 MG tablet Take 40 mg by mouth daily.      . Potassium Chloride (KLOR-CON 10 PO) Take 10 mEq by mouth daily.      . rosuvastatin (CRESTOR) 10 MG tablet Take 10 mg by mouth  daily.       Social History Social History  . Marital Status: Married    Spouse Name: [name]    Number of Children: [name]  . Years of Education: [name]   Social History Main Topics  . Smoking status: Former Games developer  . Smokeless tobacco: Not on file  . Alcohol Use: Not on file  . Drug Use: Not on file  . Sexually Active: Not on file   Review of Systems General:  No chills, fever, night sweats or weight changes Cardiovascular:  No chest pain, dyspnea on exertion, edema, orthopnea, palpitations, paroxysmal nocturnal dyspnea Dermatological: No rash, lesions or masses Respiratory: No cough, dyspnea Urologic: No hematuria, dysuria Abdominal:   No nausea, vomiting, diarrhea, bright red blood per rectum, melena, or hematemesis Neurologic:  No visual changes, weakness, changes in mental status All other systems reviewed and are otherwise negative except as noted above.  Physical Exam Blood pressure 140/87, pulse 73, height 6\' 1"  (1.854 m),  weight 195 lb 6.4 oz (88.633 kg).  General: Well developed, well appearing 71 year old male, in no acute distress. HEENT: Normocephalic, atraumatic. EOMs intact. Sclera nonicteric. Oropharynx clear.  Neck: Supple without bruits. No JVD. Lungs:  Respirations regular and unlabored, CTA bilaterally. No wheezes, rales or rhonchi. Heart: RRR. S1, S2 present. No murmurs, rub, S3 or S4. Abdomen: Soft, non-distended. Extremities: No clubbing, cyanosis or edema. DP/PT/Radials 2+ and equal bilaterally. Psych: Normal affect. Neuro: Alert and oriented X 3. Moves all extremities spontaneously.   Diagnostics/Studies 12-lead ECG shows AV pacing at 70 bpm Device interrogation today shows normal BiV ICD function with stable lead parameters; battery at [facility] (2.62V); there were no episodes; no programming changes made; see PaceArt report  Assessment and Plan 1.  BiV ICD battery at [facility] - will need generator change in the next few weeks; will repeat an echocardiogram (if not already ordered by Dr. Alanda Amass) to assess LV function prior to change out. If his LV function has improved, will plan for change out to BiV PPM; if his LV function remains <35%, he will need generator change for BiV ICD 2.  Ischemic cardiomyopathy - patient appears euvolemic by exam today; followed by Dr. Alanda Amass  This plan of care was formulated with Dr. Lewayne Bunting who was in to see the patient with me. Signed, Rick Duff, PA-C 05/26/2012, 12:57 PM

## 2012-06-25 NOTE — Interval H&P Note (Signed)
History and Physical Interval Note: He has a small area of fluctuance over his ICD insertion site. Will evaluate once we get into his pocket. Will plan BiV ICD.  06/25/2012 4:34 PM  [name]  has presented today for surgery, with the diagnosis of ERI  The various methods of treatment have been discussed with the patient and family. After consideration of risks, benefits and other options for treatment, the patient has consented to  Procedure(s) (LRB): ICD GENERATOR CHANGE (N/A) as a surgical intervention .  The patient's history has been reviewed, patient examined, no change in status, stable for surgery.  I have reviewed the patients' chart and labs.  Questions were answered to the patient's satisfaction.     Lewayne Bunting

## 2012-06-25 NOTE — Progress Notes (Signed)
PER SHERRY,RN FROM CATH LAB OK TO D/C AT 8PM OR 830PM AND CLIENT STATES WANTS TO GO HOME AT 8PM STATES HAS HOUR TO DRIVE HOME. AWAKE AND ALERT AND NO VOICED COMPLAINTS

## 2012-06-25 NOTE — Op Note (Signed)
EP procedure note  Procedure - ICD removal and re-insertion Indication - ICD at [facility] Findings - after informed consent obtained, patient prepped and draped in a sterile manner. IV fentanyl and midazolam given for sedation. 30 cc lidocaine infiltrated in the left infraclavicular pocket. 6 cm incision placed and 30 cc of brown fluid came out under pressure. Device was removed with gentle traction. Chronic inflammatory tissue present in the pocket. Sent for culture. It was my impression that the patient had a chronic indolent infection. The device was replaced in the pocket and skin sewn together. He will be scheduled for ICD system extraction in OR next week. Anti-biotics will be given.  Lewayne Bunting, M.D.

## 2012-06-26 ENCOUNTER — Telehealth: Payer: Self-pay | Admitting: Cardiology

## 2012-06-29 ENCOUNTER — Other Ambulatory Visit: Payer: Self-pay | Admitting: *Deleted

## 2012-06-29 ENCOUNTER — Telehealth: Payer: Self-pay | Admitting: *Deleted

## 2012-06-29 DIAGNOSIS — I428 Other cardiomyopathies: Secondary | ICD-10-CM

## 2012-06-29 LAB — TISSUE CULTURE
Culture: NO GROWTH
Gram Stain: NONE SEEN

## 2012-06-29 NOTE — Telephone Encounter (Signed)
Patient scheduled for ICD system removal on 07-08-12 at [facility].  He is to be at short stay at 12N.  NPO after midnight 7-17.  Instructions reviewed with patient and wife.

## 2012-07-05 NOTE — Discharge Summary (Signed)
ELECTROPHYSIOLOGY DISCHARGE SUMMARY    Patient ID: [name],  MRN: [id number], DOB/AGE: December 12, 1941 71 y.o.  Admit date: 06/25/2012 Discharge date: 07/05/2012  Primary Cardiologist: Susa Griffins, MD  Primary Discharge Diagnosis:  1. ICD pocket infection 2. 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 IV fentanyl and midazolam given for sedation. 30 cc lidocaine infiltrated in the left infraclavicular pocket. 6 cm incision placed and 30 cc of brown fluid came out under pressure. Device was removed with gentle traction. Chronic inflammatory tissue present in the pocket. Sent for culture. It was Dr. Lubertha Basque impression that the patient had a chronic indolent infection. The device was replaced in the pocket and skin sewn together. He will be scheduled for ICD system extraction in OR next week. Antibiotics will be given.  History and Hospital Course:  [name] is a pleasant 71 year old gentleman with an ischemic cardiomyopathy s/p BiV ICD implantation, CAD s/p CABG 1993, redo CABG 2000, AS s/p AVR 2010, PVD, HTN and chronic renal insufficiency who presented 05/26/2012 for device follow-up, referred by Dr. Alanda Amass for generator change as his battery is at [facility]. [name] reported he had been feeling well with no complaints. He denied chest pain, shortness  of breath, palpitations, dizziness or syncope. He denied pedal edema or abdominal swelling, orthopnea or PND. He was scheduled for BiV ICD generator change as an outpatient. He presented 06/25/2012 for generator change procedure. At that time, he was found to have a small area of fluctuance over his implant site and during his procedure there was chronic inflammatory tissue present in the pocket. This was felt to represent a chronic indolent infection. Wound/pocket culture was done. The device was replaced in the pocket and skin sutured together. Dr. [name] recommended he return for ICD system extraction in OR next week. The patient was observed in short stay post procedure. He was seen, examined and deemed stable for discharge from short stay by Dr. Lewayne Bunting.   Discharge Vitals: Blood pressure 128/75, pulse 70, temperature 98 F (36.7 C), temperature source Oral, resp. rate 18, height 6\' 1"  (1.854 m), weight 190 lb (86.183 kg), SpO2 97.00%.   Labs: Lab Results  Component Value Date   WBC 5.9 06/22/2012   HGB 12.8* 06/22/2012   HCT 38.2* 06/22/2012   MCV 94.6 06/22/2012   PLT 231.0 06/22/2012    Disposition:  The patient is being discharged in stable condition.  Follow-up: The patient is scheduled for ICD system extraction on 07/08/2012 at 2:00 PM. He will arrive at short stay at 12:00 noon.  Discharge Medications:  Medication List  As of 07/05/2012  8:46 AM   ASK your doctor about these medications         amiodarone 200 MG tablet   Commonly known as: PACERONE   Take 300 mg by mouth daily.  aspirin EC 81 MG tablet   Take 81 mg by mouth daily.      carvedilol 12.5 MG tablet   Commonly known as: COREG   Take 12.5 mg by mouth 2 (two) times daily.      enalapril 5 MG tablet   Commonly known as: VASOTEC   Take 2.5-5 mg by mouth 2 (two) times daily. Take 1 tablet every morning and take  tablet every evening.      furosemide 40 MG tablet   Commonly known as: LASIX   Take 60 mg by  mouth daily.      LORazepam 0.5 MG tablet   Commonly known as: ATIVAN   Take 0.25 mg by mouth daily.      magnesium oxide 400 MG tablet   Commonly known as: MAG-OX   Take 400 mg by mouth 2 (two) times daily.      nitroGLYCERIN 0.4 MG SL tablet   Commonly known as: NITROSTAT   Place 0.4 mg under the tongue every 5 (five) minutes as needed.      potassium chloride SA 20 MEQ tablet   Commonly known as: K-DUR,KLOR-CON   Take 20 mEq by mouth daily.      rosuvastatin 10 MG tablet   Commonly known as: CRESTOR   Take 10 mg by mouth daily.      spironolactone 25 MG tablet   Commonly known as: ALDACTONE   Take 25 mg by mouth daily.      vitamin B-12 1000 MCG tablet   Commonly known as: CYANOCOBALAMIN   Take 1,000 mcg by mouth daily.          Duration of Discharge Encounter: Greater than 30 minutes including physician time.  Signed, Rick Duff, PA-C 07/05/2012, 8:46 AM

## 2012-07-07 MED ORDER — GENTAMICIN SULFATE 40 MG/ML IJ SOLN
80.0000 mg | INTRAMUSCULAR | Status: DC
  Filled 2012-07-07: qty 2

## 2012-07-07 MED ORDER — VANCOMYCIN HCL IN DEXTROSE 1-5 GM/200ML-% IV SOLN
1000.0000 mg | INTRAVENOUS | Status: DC
  Filled 2012-07-07: qty 200

## 2012-07-08 ENCOUNTER — Inpatient Hospital Stay (HOSPITAL_COMMUNITY)
Admission: RE | Admit: 2012-07-08 | Discharge: 2012-07-12 | DRG: 261 | Disposition: A | Discharge status: Home / Self Care | Payer: Medicare Other | Source: Ambulatory Visit | Attending: Internal Medicine | Admitting: Internal Medicine

## 2012-07-08 ENCOUNTER — Encounter (HOSPITAL_COMMUNITY): Payer: Self-pay | Admitting: *Deleted

## 2012-07-08 ENCOUNTER — Encounter (HOSPITAL_COMMUNITY): Payer: Self-pay | Admitting: Anesthesiology

## 2012-07-08 ENCOUNTER — Encounter (HOSPITAL_COMMUNITY): Payer: Self-pay | Admitting: Pharmacy Technician

## 2012-07-08 ENCOUNTER — Encounter (HOSPITAL_COMMUNITY)
Admission: RE | Disposition: A | Discharge status: Home / Self Care | Payer: Self-pay | Source: Ambulatory Visit | Attending: Internal Medicine

## 2012-07-08 ENCOUNTER — Ambulatory Visit (HOSPITAL_COMMUNITY): Payer: Medicare Other | Admitting: Anesthesiology

## 2012-07-08 DIAGNOSIS — I5042 Chronic combined systolic (congestive) and diastolic (congestive) heart failure: Secondary | ICD-10-CM

## 2012-07-08 DIAGNOSIS — I2510 Atherosclerotic heart disease of native coronary artery without angina pectoris: Secondary | ICD-10-CM | POA: Diagnosis present

## 2012-07-08 DIAGNOSIS — Z95810 Presence of automatic (implantable) cardiac defibrillator: Secondary | ICD-10-CM | POA: Diagnosis present

## 2012-07-08 DIAGNOSIS — I4891 Unspecified atrial fibrillation: Secondary | ICD-10-CM

## 2012-07-08 DIAGNOSIS — Z951 Presence of aortocoronary bypass graft: Secondary | ICD-10-CM

## 2012-07-08 DIAGNOSIS — E039 Hypothyroidism, unspecified: Secondary | ICD-10-CM | POA: Diagnosis present

## 2012-07-08 DIAGNOSIS — I739 Peripheral vascular disease, unspecified: Secondary | ICD-10-CM

## 2012-07-08 DIAGNOSIS — E785 Hyperlipidemia, unspecified: Secondary | ICD-10-CM

## 2012-07-08 DIAGNOSIS — Z9889 Other specified postprocedural states: Secondary | ICD-10-CM

## 2012-07-08 DIAGNOSIS — I2589 Other forms of chronic ischemic heart disease: Secondary | ICD-10-CM | POA: Diagnosis present

## 2012-07-08 DIAGNOSIS — I428 Other cardiomyopathies: Secondary | ICD-10-CM

## 2012-07-08 DIAGNOSIS — Y849 Medical procedure, unspecified as the cause of abnormal reaction of the patient, or of later complication, without mention of misadventure at the time of the procedure: Secondary | ICD-10-CM | POA: Diagnosis present

## 2012-07-08 DIAGNOSIS — T827XXA Infection and inflammatory reaction due to other cardiac and vascular devices, implants and grafts, initial encounter: Principal | ICD-10-CM | POA: Diagnosis present

## 2012-07-08 DIAGNOSIS — I679 Cerebrovascular disease, unspecified: Secondary | ICD-10-CM

## 2012-07-08 DIAGNOSIS — I10 Essential (primary) hypertension: Secondary | ICD-10-CM | POA: Diagnosis present

## 2012-07-08 DIAGNOSIS — T82598A Other mechanical complication of other cardiac and vascular devices and implants, initial encounter: Secondary | ICD-10-CM

## 2012-07-08 DIAGNOSIS — I509 Heart failure, unspecified: Secondary | ICD-10-CM | POA: Diagnosis present

## 2012-07-08 HISTORY — DX: Infection and inflammatory reaction due to other cardiac and vascular devices, implants and grafts, initial encounter: T82.7XXA

## 2012-07-08 LAB — BASIC METABOLIC PANEL
Calcium: 9.6 mg/dL (ref 8.4–10.5)
GFR calc Af Amer: 72 mL/min — ABNORMAL LOW (ref >90)
GFR calc non Af Amer: 62 mL/min — ABNORMAL LOW (ref >90)
Glucose, Bld: 95 mg/dL (ref 70–99)
Potassium: 4.2 mEq/L (ref 3.5–5.1)
Sodium: 129 mEq/L — ABNORMAL LOW (ref 135–145)

## 2012-07-08 LAB — TYPE AND SCREEN
ABO/RH(D): O POS
Antibody Screen: NEGATIVE

## 2012-07-08 LAB — CBC
Hemoglobin: 11.9 g/dL — ABNORMAL LOW (ref 13.0–17.0)
MCH: 31.8 pg (ref 26.0–34.0)
MCHC: 34.3 g/dL (ref 30.0–36.0)
RDW: 14.7 % (ref 11.5–15.5)

## 2012-07-08 LAB — PROTIME-INR: INR: 1.05 (ref 0.00–1.49)

## 2012-07-08 SURGERY — REMOVAL, ELECTRODE LEAD, ICD
Anesthesia: Monitor Anesthesia Care | Site: Chest | Wound class: Clean Contaminated

## 2012-07-08 MED ORDER — ASPIRIN EC 81 MG PO TBEC
81.0000 mg | DELAYED_RELEASE_TABLET | Freq: Every day | ORAL | Status: DC
  Administered 2012-07-09 – 2012-07-12 (×4): 81 mg via ORAL
  Filled 2012-07-08 (×4): qty 1

## 2012-07-08 MED ORDER — SODIUM CHLORIDE 0.9 % IV SOLN: 250.0000 mL | INTRAVENOUS | Status: DC

## 2012-07-08 MED ORDER — SODIUM CHLORIDE 0.9 % IJ SOLN: 3.0000 mL | Freq: Two times a day (BID) | INTRAMUSCULAR | Status: DC

## 2012-07-08 MED ORDER — CEFAZOLIN SODIUM-DEXTROSE 2-3 GM-% IV SOLR
2.0000 g | Freq: Four times a day (QID) | INTRAVENOUS | Status: AC
  Administered 2012-07-08 – 2012-07-09 (×3): 2 g via INTRAVENOUS
  Filled 2012-07-08 (×3): qty 50

## 2012-07-08 MED ORDER — LIDOCAINE HCL (PF) 1 % IJ SOLN
INTRAMUSCULAR | Status: AC
  Filled 2012-07-08: qty 60

## 2012-07-08 MED ORDER — CARVEDILOL 12.5 MG PO TABS
12.5000 mg | ORAL_TABLET | Freq: Every day | ORAL | Status: DC
  Administered 2012-07-09 – 2012-07-12 (×3): 12.5 mg via ORAL
  Filled 2012-07-08 (×4): qty 1

## 2012-07-08 MED ORDER — LIDOCAINE HCL (PF) 1 % IJ SOLN
INTRAMUSCULAR | Status: DC | PRN
  Administered 2012-07-08: 30 mL via SUBCUTANEOUS

## 2012-07-08 MED ORDER — ONDANSETRON HCL 4 MG/2ML IJ SOLN: 4.0000 mg | Freq: Four times a day (QID) | INTRAMUSCULAR | Status: DC | PRN

## 2012-07-08 MED ORDER — CHLORHEXIDINE GLUCONATE 4 % EX LIQD: 60.0000 mL | Freq: Once | CUTANEOUS | Status: DC

## 2012-07-08 MED ORDER — LACTATED RINGERS IV SOLN
INTRAVENOUS | Status: DC | PRN
  Administered 2012-07-08: 15:00:00 via INTRAVENOUS

## 2012-07-08 MED ORDER — FUROSEMIDE 40 MG PO TABS
60.0000 mg | ORAL_TABLET | Freq: Every day | ORAL | Status: DC
  Administered 2012-07-09 – 2012-07-10 (×2): 60 mg via ORAL
  Filled 2012-07-08 (×3): qty 1

## 2012-07-08 MED ORDER — HYDROCODONE-ACETAMINOPHEN 5-325 MG PO TABS
1.0000 | ORAL_TABLET | ORAL | Status: DC | PRN
  Administered 2012-07-08 – 2012-07-11 (×3): 2 via ORAL
  Filled 2012-07-08 (×3): qty 2

## 2012-07-08 MED ORDER — AMIODARONE HCL 200 MG PO TABS
300.0000 mg | ORAL_TABLET | Freq: Every day | ORAL | Status: DC
  Administered 2012-07-09 – 2012-07-12 (×4): 300 mg via ORAL
  Filled 2012-07-08 (×4): qty 1

## 2012-07-08 MED ORDER — SODIUM CHLORIDE 0.9 % IR SOLN
80.0000 mg | Status: DC
  Filled 2012-07-08: qty 2

## 2012-07-08 MED ORDER — MIDAZOLAM HCL 2 MG/2ML IJ SOLN: 0.5000 mg | Freq: Once | INTRAMUSCULAR | Status: DC | PRN

## 2012-07-08 MED ORDER — PROPOFOL 10 MG/ML IV EMUL
INTRAVENOUS | Status: DC | PRN
  Administered 2012-07-08: 75 ug/kg/min via INTRAVENOUS

## 2012-07-08 MED ORDER — CEPHALEXIN 500 MG PO CAPS
500.0000 mg | ORAL_CAPSULE | Freq: Two times a day (BID) | ORAL | Status: DC
  Administered 2012-07-09 – 2012-07-12 (×6): 500 mg via ORAL
  Filled 2012-07-08 (×7): qty 1

## 2012-07-08 MED ORDER — LORAZEPAM 0.5 MG PO TABS
0.2500 mg | ORAL_TABLET | Freq: Three times a day (TID) | ORAL | Status: DC
  Administered 2012-07-08 – 2012-07-12 (×12): 0.25 mg via ORAL
  Filled 2012-07-08 (×13): qty 1

## 2012-07-08 MED ORDER — SODIUM CHLORIDE 0.9 % IV SOLN
1000.0000 mg | INTRAVENOUS | Status: DC | PRN
  Administered 2012-07-08: 1000 mg via INTRAVENOUS

## 2012-07-08 MED ORDER — SPIRONOLACTONE 25 MG PO TABS
25.0000 mg | ORAL_TABLET | Freq: Every day | ORAL | Status: DC
  Administered 2012-07-09 – 2012-07-12 (×4): 25 mg via ORAL
  Filled 2012-07-08 (×4): qty 1

## 2012-07-08 MED ORDER — ENALAPRIL MALEATE 2.5 MG PO TABS
2.5000 mg | ORAL_TABLET | Freq: Every day | ORAL | Status: DC
  Administered 2012-07-08 – 2012-07-11 (×4): 2.5 mg via ORAL
  Filled 2012-07-08 (×5): qty 1

## 2012-07-08 MED ORDER — FENTANYL CITRATE 0.05 MG/ML IJ SOLN: 25.0000 ug | INTRAMUSCULAR | Status: DC | PRN

## 2012-07-08 MED ORDER — PROMETHAZINE HCL 25 MG/ML IJ SOLN: 6.2500 mg | INTRAMUSCULAR | Status: DC | PRN

## 2012-07-08 MED ORDER — LACTATED RINGERS IV SOLN
INTRAVENOUS | Status: DC
  Administered 2012-07-08: 14:00:00 via INTRAVENOUS

## 2012-07-08 MED ORDER — MEPERIDINE HCL 25 MG/ML IJ SOLN: 6.2500 mg | INTRAMUSCULAR | Status: DC | PRN

## 2012-07-08 MED ORDER — SODIUM CHLORIDE 0.9 % IJ SOLN: 3.0000 mL | INTRAMUSCULAR | Status: DC | PRN

## 2012-07-08 MED ORDER — SODIUM CHLORIDE 0.45 % IV SOLN: INTRAVENOUS | Status: DC

## 2012-07-08 MED ORDER — ATORVASTATIN CALCIUM 10 MG PO TABS: 10.0000 mg | ORAL_TABLET | Freq: Every day | ORAL | Status: DC

## 2012-07-08 MED ORDER — VITAMIN B-12 1000 MCG PO TABS
1000.0000 ug | ORAL_TABLET | Freq: Every day | ORAL | Status: DC
  Administered 2012-07-09 – 2012-07-12 (×4): 1000 ug via ORAL
  Filled 2012-07-08 (×4): qty 1

## 2012-07-08 MED ORDER — LIDOCAINE HCL (CARDIAC) 20 MG/ML IV SOLN
INTRAVENOUS | Status: DC | PRN
  Administered 2012-07-08: 60 mg via INTRAVENOUS

## 2012-07-08 MED ORDER — ENALAPRIL MALEATE 5 MG PO TABS
5.0000 mg | ORAL_TABLET | Freq: Every day | ORAL | Status: DC
  Administered 2012-07-09 – 2012-07-12 (×4): 5 mg via ORAL
  Filled 2012-07-08 (×4): qty 1

## 2012-07-08 MED ORDER — ENALAPRIL MALEATE 2.5 MG PO TABS
2.5000 mg | ORAL_TABLET | Freq: Two times a day (BID) | ORAL | Status: DC
  Filled 2012-07-08: qty 2

## 2012-07-08 MED ORDER — ACETAMINOPHEN 325 MG PO TABS
325.0000 mg | ORAL_TABLET | ORAL | Status: DC | PRN
  Administered 2012-07-10 – 2012-07-11 (×2): 650 mg via ORAL
  Administered 2012-07-12: 325 mg via ORAL
  Filled 2012-07-08 (×3): qty 2

## 2012-07-08 MED ORDER — POTASSIUM CHLORIDE CRYS ER 20 MEQ PO TBCR
20.0000 meq | EXTENDED_RELEASE_TABLET | Freq: Every day | ORAL | Status: DC
  Administered 2012-07-09 – 2012-07-12 (×4): 20 meq via ORAL
  Filled 2012-07-08 (×4): qty 1

## 2012-07-08 MED ORDER — ROSUVASTATIN CALCIUM 10 MG PO TABS
10.0000 mg | ORAL_TABLET | Freq: Every day | ORAL | Status: DC
  Administered 2012-07-09 – 2012-07-12 (×3): 10 mg via ORAL
  Filled 2012-07-08 (×4): qty 1

## 2012-07-08 MED ORDER — MIDAZOLAM HCL 5 MG/5ML IJ SOLN
INTRAMUSCULAR | Status: DC | PRN
  Administered 2012-07-08: 2 mg via INTRAVENOUS

## 2012-07-08 MED ORDER — FENTANYL CITRATE 0.05 MG/ML IJ SOLN
INTRAMUSCULAR | Status: DC | PRN
  Administered 2012-07-08: 25 ug via INTRAVENOUS
  Administered 2012-07-08 (×2): 50 ug via INTRAVENOUS

## 2012-07-08 SURGICAL SUPPLY — 28 items
CANISTER SUCTION 2500CC (MISCELLANEOUS) ×2 IMPLANT
CLOTH BEACON ORANGE TIMEOUT ST (SAFETY) ×2 IMPLANT
DRAPE C-ARM 42X72 X-RAY (DRAPES) ×2 IMPLANT
DRAPE CARDIOVASCULAR INCISE (DRAPES) ×2
DRAPE INCISE IOBAN 66X45 STRL (DRAPES) ×2 IMPLANT
DRAPE PROXIMA HALF (DRAPES) ×4 IMPLANT
DRAPE SRG 135X102X78XABS (DRAPES) ×1 IMPLANT
ELECT REM PT RETURN 9FT ADLT (ELECTROSURGICAL) ×2
ELECTRODE REM PT RTRN 9FT ADLT (ELECTROSURGICAL) ×1 IMPLANT
GAUZE PACKING IODOFORM 1 (PACKING) ×1 IMPLANT
GAUZE SPONGE 4X4 16PLY XRAY LF (GAUZE/BANDAGES/DRESSINGS) ×2 IMPLANT
GLOVE BIO SURGEON STRL SZ8 (GLOVE) ×2 IMPLANT
GLOVE BIOGEL PI IND STRL 7.5 (GLOVE) ×1 IMPLANT
GLOVE BIOGEL PI INDICATOR 7.5 (GLOVE) ×1
GOWN PREVENTION PLUS XLARGE (GOWN DISPOSABLE) ×2 IMPLANT
GOWN STRL NON-REIN LRG LVL3 (GOWN DISPOSABLE) ×4 IMPLANT
KIT ROOM TURNOVER OR (KITS) ×2 IMPLANT
PAD ARMBOARD 7.5X6 YLW CONV (MISCELLANEOUS) ×4 IMPLANT
PENCIL BUTTON HOLSTER BLD 10FT (ELECTRODE) IMPLANT
SPONGE GAUZE 4X4 12PLY (GAUZE/BANDAGES/DRESSINGS) ×2 IMPLANT
SUT PROLENE 2 0 CT2 30 (SUTURE) ×4 IMPLANT
SUT SILK 0 FSL (SUTURE) ×4 IMPLANT
SUT VIC AB 2-0 CT2 18 VCP726D (SUTURE) ×2 IMPLANT
SUT VIC AB 3-0 X1 27 (SUTURE) ×2 IMPLANT
TAPE CLOTH SURG 4X10 WHT LF (GAUZE/BANDAGES/DRESSINGS) ×1 IMPLANT
TOWEL OR 17X24 6PK STRL BLUE (TOWEL DISPOSABLE) ×4 IMPLANT
TUBE CONNECTING 12X1/4 (SUCTIONS) ×2 IMPLANT
YANKAUER SUCT BULB TIP NO VENT (SUCTIONS) ×2 IMPLANT

## 2012-07-08 NOTE — Op Note (Signed)
ICD system extraction without immediate complication. [id number].

## 2012-07-08 NOTE — Preoperative (Signed)
Beta Blockers   Reason not to administer Beta Blockers:Not Applicable 

## 2012-07-08 NOTE — Anesthesia Preprocedure Evaluation (Addendum)
Anesthesia Evaluation  Patient identified by MRN, date of birth, ID band Patient awake    Reviewed: Allergy & Precautions, H&P , NPO status , Patient's Chart, lab work & pertinent test results, reviewed documented beta blocker date and time   History of Anesthesia Complications Negative for: history of anesthetic complications  Airway Mallampati: I TM Distance: >3 FB Neck ROM: Full    Dental  (+) Edentulous Upper and Edentulous Lower   Pulmonary neg pulmonary ROS,  breath sounds clear to auscultation  Pulmonary exam normal       Cardiovascular hypertension, Pt. on medications and Pt. 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)   Renal/GU negative Renal ROS     Musculoskeletal   Abdominal   Peds  Hematology   Anesthesia Other Findings   Reproductive/Obstetrics                           Anesthesia Physical Anesthesia Plan  ASA: III  Anesthesia Plan: MAC   Post-op Pain Management:    Induction: Intravenous  Airway Management Planned: Simple Face Mask  Additional Equipment:   Intra-op Plan:   Post-operative Plan:   Informed Consent: I have reviewed the patients History and Physical, chart, labs and discussed the procedure including the risks, benefits and alternatives for the proposed anesthesia with the patient or authorized representative who has indicated his/her understanding and acceptance.     Plan Discussed with: CRNA and Surgeon  Anesthesia Plan Comments: (Plan routine monitors, MAC)        Anesthesia Quick Evaluation

## 2012-07-08 NOTE — Anesthesia Postprocedure Evaluation (Signed)
  Anesthesia Post-op Note  Patient: [name]  Procedure(s) Performed: Procedure(s) (LRB): ICD LEAD REMOVAL (N/A)  Patient Location: PACU  Anesthesia Type: MAC  Level of Consciousness: awake, alert  and oriented  Airway and Oxygen Therapy: Patient Spontanous Breathing  Post-op Pain: none  Post-op Assessment: Post-op Vital signs reviewed, Patient's Cardiovascular Status Stable, Respiratory Function Stable, Patent Airway, No signs of Nausea or vomiting and Pain level controlled  Post-op Vital Signs: Reviewed and stable  Complications: No apparent anesthesia complications

## 2012-07-08 NOTE — Transfer of Care (Signed)
Immediate Anesthesia Transfer of Care Note  Patient: [name]  Procedure(s) Performed: Procedure(s) (LRB): ICD LEAD REMOVAL (N/A)  Patient Location: PACU  Anesthesia Type: MAC  Level of Consciousness: awake, alert  and oriented  Airway & Oxygen Therapy: Patient Spontanous Breathing  Post-op Assessment: Report given to PACU RN and Post -op Vital signs reviewed and stable  Post vital signs: Reviewed and stable  Complications: No apparent anesthesia complications

## 2012-07-09 ENCOUNTER — Inpatient Hospital Stay (HOSPITAL_COMMUNITY): Payer: Medicare Other

## 2012-07-09 DIAGNOSIS — Z95810 Presence of automatic (implantable) cardiac defibrillator: Secondary | ICD-10-CM

## 2012-07-09 DIAGNOSIS — I428 Other cardiomyopathies: Secondary | ICD-10-CM

## 2012-07-09 MED ORDER — CEFAZOLIN SODIUM-DEXTROSE 2-3 GM-% IV SOLR
2.0000 g | INTRAVENOUS | Status: AC
  Administered 2012-07-09: 2 g via INTRAVENOUS
  Filled 2012-07-09: qty 50

## 2012-07-09 NOTE — Op Note (Signed)
[name], [name] NO.:  [id number]  MEDICAL RECORD NO.:  [id number]  LOCATION:  2010                         FACILITY:  [facility]  PHYSICIAN:  Doylene Canning. [name], MD    DATE OF BIRTH:  [birth year]  DATE OF PROCEDURE:  07/08/2012 DATE OF DISCHARGE:                              OPERATIVE REPORT   PROCEDURE PERFORMED:  Extraction of a biventricular ICD generator and pacing system.  INDICATION:  ICD pocket infection.  INTRODUCTION:  The patient is a 71 year old man who presented for ICD generator change last week.  He was found to have an occult ICD pocket infection with fluid and necrotic tissue in the pocket.  He returns today to have his device extracted.  DESCRIPTION OF PROCEDURE:  After informed was obtained, the patient was taken to the operating room in a fasting state.  After usual preparation and draping, he was sedated with propofol, fentanyl, and Versed.  A 30 mL of lidocaine was infiltrated into the left infraclavicular region over the old ICD insertion site.  A 7 cm incision was carried out over this region.  Electrocautery was utilized to dissect down to the fascial plane.  Electrocautery was then utilized to free up the ICD and pacing leads.  The generator was removed.  The left ventricular lead was first targeted.  An 0.14 guidewire was advanced into the left ventricular lead and gentle traction was applied.  Unfortunately, the lead was stuck probably at the subclavian vein, innominate vein, superior vena cava junction.  Attention was then turned towards the atrial lead.  The 52-cm stylet was advanced into the atrial lead and the helix retracted. Gentle traction was then placed in lead and it too was tucked.  Next the defibrillator lead was evaluated and a 65 cm stylet was advanced into the defibrillation lead and the helix was retracted.  Gentle traction was then placed on this lead and it remained fixed.  At this point, the 11-French [facility]  electrosurgical dissection sheath, R and L, was utilized to aid in lead extraction.  Prior to this, however, the liberator locking stylet was advanced into the atrial lead and fixed.  A 1 tie was then connected to the lead and the liberator stylet.  The previously mentioned 11-French [facility] RL electrosurgical dissection sheath was advanced over the atrial lead into the subclavian vein without difficulty.  This was replaced with 11-French long R and L sheath and advanced with modest amount of difficulty through the subclavian vein into the innominate vein and on into the into the superior vena cava. At the electrosurgical dissection sheath, reached the superior vena cava, and along with gentle traction on the lead.  The lead was removed in total.  No hemodynamic sequela were observed.  Attention was then turned at the defibrillator lead.  The lead was cut and the [facility] locking stylet  (liberator) was advanced into the defibrillator lead.  The lead was locked in place in the usual manner and a 1 tied it locking mechanism was placed over the proximal port of the lead.  At this point, the 11-French cook sheath was advanced into the subclavian vein without difficulty.  Next, this was changed out  for a 13-French electrosurgical dissection sheath, and utilizing a combination of pressure, counter pressure and tension along with working of the cutting portion of the sheath back and forth.  The defibrillator lead was removed in toto.  There was again no hemodynamic sequela.  At this point, attention was placed on the LV lead in hopes that it will come out easily but did not.  A Cook of locking stylet was again advanced into the LV lead and it too was removed utilizing the electrosurgical dissection sheath.  At this point, hemostasis was obtained, and the pocket was irrigated and debrided and the incision was closed with 2-0 Prolene sutures.  Iodoform gauze was placed in the pocket, the  pressure dressing was placed.  The patient was returned to the recovery area in satisfactory condition.  COMPLICATIONS:  There were no immediate procedure complications. Results demonstrate successful ICD system extraction in a patient with an occult ICD pocket infection.     Doylene Canning. [name], MD     GWT/MEDQ  D:  07/08/2012  T:  07/09/2012  Job:  [id number]  cc:   Gerlene Burdock A. Alanda Amass, M.D.

## 2012-07-09 NOTE — Discharge Summary (Signed)
ELECTROPHYSIOLOGY DISCHARGE SUMMARY    Patient ID: [name],  MRN: [id number], DOB/AGE: 1941/08/03 71 y.o.  Admit date: 07/08/2012 Discharge date: 07/12/2012  Primary Cardiologist: Susa Griffins, MD  Primary Discharge Diagnosis:  1. 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 BiV ICD system extraction 07/08/2012  Conclusions per report - There were no immediate procedure complications. Results demonstrated successful ICD system extraction in a patient with an occult ICD pocket infection.  History and Hospital Course:  [name] is a pleasant 71 year old gentleman with an ischemic cardiomyopathy s/p BiV ICD implantation, CAD s/p CABG 1993, redo CABG 2000, AS s/p AVR 2010, PVD, HTN and chronic kidney disease who presented last month for device follow-up, referred by Dr. Alanda Amass for generator change as his battery was at [facility]. [name] reported he had been feeling well and he had no complaints. 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 Wound/pocket culture was done. The device was replaced in the pocket and skin sutured together. Dr. [name] recommended he return for ICD system extraction in OR this week. He underwent BiV ICD system extraction  on 07/08/2012. The patient tolerated this procedure well without any immediate complication. He remains hemodynamically stable and afebrile. He did have significant purulent drainage post-op day 1-2 and was kept in the hospital for observation. Today, his explant site is intact with minimal drainage and without significant bleeding or hematoma.  He has been given discharge instructions including wound care and activity restrictions. He will follow-up in the office on Wednesday for wound check and with Dr. [name] in 4 weeks. He will continue PO antibiotics. There were no changes made to his medications. He has been seen, examined and deemed stable for discharge today by Dr. Berton Mount.  Physical Exam: Vitals: Blood pressure 115/71, pulse 60, temperature 98.1 F (36.7 C), temperature source Oral, resp. rate 18, height 6\' 1"  (1.854 m), weight 189 lb 9.5 oz (86 kg), SpO2 98.00%.  General: Well developed, well appearing 71 year old male in no distress. Heart: RRR. S1, S2 without murmur, rub or S3. Lungs: CTA bilaterally without wheezes, rales or rhonchi. Extremities: No cyanosis, clubbing or edema. Skin: Explant site intact without significant bleeding or hematoma.  Labs: Lab Results  Component Value Date   WBC 6.6 07/08/2012   HGB 11.9* 07/08/2012   HCT 34.7* 07/08/2012   MCV 92.8 07/08/2012   PLT 198 07/08/2012     Lab 07/08/12 1230  NA 129*  K 4.2  CL 90*  CO2 25  BUN 16  CREATININE 1.16  CALCIUM 9.6  PROT --  BILITOT --  ALKPHOS --  ALT --  AST --  GLUCOSE 95    Disposition:  The patient is being discharged in stable condition.  Follow-up: Follow-up  Information    Follow up with [facility] on 07/19/2012. (At 4:30 PM for wound check)    Contact information:   [address]  [address] [place] [address] [phone]      Follow up with Lewayne Bunting, MD on 08/13/2012. (At 4:30 PM)    Contact information:   [address]  [address] [place] [address] [phone]   Discharge Medications:  Medication List  As of 07/12/2012 12:24 PM   TAKE these medications         amiodarone 200 MG tablet   Commonly known as: PACERONE   Take 300 mg by mouth daily.      aspirin EC 81 MG tablet   Take 81 mg by mouth daily.      carvedilol 12.5 MG tablet   Commonly known as: COREG   Take 12.5 mg by mouth daily.      cephALEXin 500 MG capsule   Commonly known as: KEFLEX   Take 1 capsule (500 mg total) by mouth 2 (two) times daily.      enalapril 5 MG tablet   Commonly known as: VASOTEC   Take 2.5-5 mg by mouth 2 (two) times daily. Take 1 tablet every morning and take  tablet every evening.      furosemide 40 MG tablet   Commonly known as: LASIX   Take 60 mg by mouth daily.      LORazepam 0.5 MG tablet   Commonly known as: ATIVAN   Take 0.25 mg by mouth every 8 (eight) hours.      magnesium oxide 400 MG tablet   Commonly known as: MAG-OX   Take 400 mg by mouth 2 (two) times daily.      nitroGLYCERIN 0.4 MG SL tablet   Commonly known as: NITROSTAT   Place 0.4 mg under the tongue every 5 (five) minutes as needed.      potassium chloride SA 20 MEQ tablet   Commonly known as: K-DUR,KLOR-CON   Take 20 mEq by mouth daily.      rosuvastatin 10 MG tablet   Commonly known as: CRESTOR   Take 10 mg by mouth daily.      spironolactone 25 MG tablet   Commonly known as: ALDACTONE   Take 25 mg by mouth daily.      vitamin B-12 1000 MCG tablet   Commonly known as: CYANOCOBALAMIN   Take 1,000 mcg by mouth daily.      Duration of Discharge Encounter: Greater than 30 minutes including physician time.  Signed, Rick Duff, PA-C 07/12/2012, 12:24 PM   I have seen, examined the patient, and reviewed the above assessment and plan.  Changes to above are made where necessary.    Co Sign: Hillis Range, MD

## 2012-07-09 NOTE — Progress Notes (Addendum)
Patient: [name] Date of Encounter: 07/09/2012, 11:45 AM Admit date: 07/08/2012     Subjective  Mr. Krummel reports moderate soreness at the explant site/left upper chest. He denies SOB, palpitations or dizziness. He denies fever or chills.    Objective  Physical Exam: Vitals: BP 128/72  Pulse 70  Temp 98.8 F (37.1 C) (Oral)  Resp 18  SpO2 94% General: Well developed, well appearing 71 year old male in no acute distress. Neck: Supple. JVD not elevated. Lungs: Clear bilaterally to auscultation without wheezes, rales, or rhonchi. Breathing is unlabored. Heart: RRR S1 S2 without murmurs, rubs, or gallops.  Abdomen: Soft, non-distended. Extremities: No clubbing or cyanosis. No edema.  Distal pedal pulses are 2+ and equal bilaterally. Neuro: Alert and oriented X 3. Moves all extremities spontaneously. No focal deficits. Skin: Explant site/left upper chest with serosanguinous drainage. No significant bleeding or hematoma.  Intake/Output: Intake/Output Summary (Last 24 hours) at 07/09/12 1145 Last data filed at 07/09/12 0730  Gross per 24 hour  Intake   1885 ml  Output    815 ml  Net   1070 ml   Inpatient Medications:  . amiodarone  300 mg Oral Daily  . aspirin EC  81 mg Oral Daily  . carvedilol  12.5 mg Oral Daily  .  ceFAZolin (ANCEF) IV  2 g Intravenous Q6H  . cephALEXin  500 mg Oral BID  . enalapril  2.5 mg Oral QHS  . enalapril  5 mg Oral Daily  . furosemide  60 mg Oral Daily  . LORazepam  0.25 mg Oral Q8H  . potassium chloride SA  20 mEq Oral Daily  . rosuvastatin  10 mg Oral q1800  . spironolactone  25 mg Oral Daily  . vitamin B-12  1,000 mcg Oral Daily   Labs:  Basename 07/08/12 1230  NA 129*  K 4.2  CL 90*  CO2 25  GLUCOSE 95  BUN 16  CREATININE 1.16  CALCIUM 9.6  MG --  PHOS --    Basename 07/08/12 1230  WBC 6.6  NEUTROABS --  HGB 11.9*  HCT 34.7*  MCV 92.8  PLT 198    Radiology/Studies: Dg Chest 2 View  07/09/2012   *RADIOLOGY REPORT*  Clinical Data: Removal of pacemaker and leads  CHEST - 2 VIEW  Comparison: 06/25/2012  Findings: Interval removal of left subclavian pacemaker / AICD and leads. Enlargement of cardiac silhouette post CABG. Calcified tortuous aorta. Pulmonary vascular congestion. Gaseous density projecting over the upper lateral left chest likely represents the surgical site at the anterior chest wall post generator removal. Probable emphysematous changes. Minimal bibasilar atelectasis. No definite infiltrate, pleural effusion or pneumothorax.  IMPRESSION: Enlargement of cardiac silhouette post CABG. Question emphysematous changes. Minimal bibasilar atelectasis.  Original Report Authenticated By: Lollie Marrow, M.D.   Telemetry: sinus rhythm; no arrhythmias   Assessment and Plan  1. ICD pocket infection s/p BiV ICD system removal  Mr. Ausborn is doing well post explant and remains afebrile. He will need to continue antibiotic therapy and return in 3-4 weeks for reimplantation of BiV ICD system on right side. In the interim, may need LifeVest.   Dr. Johney Frame to see and make further recommendations. Signed, EDMISTEN, BROOKE PA-C   I have seen, examined the patient, and reviewed the above assessment and plan.  Changes to above are made where necessary.  The patient is doing well today except for low grade fevers and left chest wall discomfort.  He  has moderate purulent drainage from his pocket today.  I have reapplied a new dressing.  We will continue IV antibiotics for now.  I would anticipate that he could likely go home on Sunday if his drainage is improved on oral keflex.  I will have him evaluated in our device clinic again next week.  As he has not had appropriate therapy in the past, I do not think that he will need a lifevest at discharge.  Dr Royann Shivers has agreed to see the patient in rounds this weekend.   Co Sign: Hillis Range, MD 07/09/2012 4:12 PM

## 2012-07-09 NOTE — Care Management Note (Unsigned)
    Page 1 of 1   07/09/2012     10:49:57 AM   CARE MANAGEMENT NOTE 07/09/2012  Patient:  [name], [name]   Account Number:  [id number]  Date Initiated:  07/09/2012  Documentation initiated by:  SIMMONS,Shaine Mount  Subjective/Objective Assessment:   ADMITTED WITH INFECTED ICD; LIVES AT HOME WITH WIFE- ELIZABETH; WAS IPTA- STILL FARMS; USES [facility] [place] FOR MEDS.     Action/Plan:   DISCHARGE PLANNING DISCUSSED AT BEDSIDE.   Anticipated DC Date:  07/10/2012   Anticipated DC Plan:  HOME/SELF CARE      DC Planning Services  CM consult      Choice offered to / List presented to:             Status of service:  In process, will continue to follow Medicare Important Message given?   (If response is "NO", the following Medicare IM given date fields will be blank) Date Medicare IM given:   Date Additional Medicare IM given:    Discharge Disposition:    Per UR Regulation:  Reviewed for med. necessity/level of care/duration of stay  If discussed at Long Length of Stay Meetings, dates discussed:    Comments:  07/09/12  0900  Janyiah Silveri SIMMONS RN, BSN [phone] NCM WILL FOLLOW.

## 2012-07-10 NOTE — Progress Notes (Signed)
[facility]  DAILY PROGRESS NOTE   Subjective:  Continues to have moderate soreness at surgical site. Dressing nearly saturated. No fever or chills. No arrhythmia.  Objective:  Temp:  [98.8 F (37.1 C)-100.9 F (38.3 C)] 98.8 F (37.1 C) (07/20 0816) Pulse Rate:  [67-69] 67  (07/20 0816) Resp:  [18-20] 20  (07/20 0816) BP: (98-120)/(58-72) 98/58 mmHg (07/20 0816) SpO2:  [92 %-93 %] 93 % (07/20 0816) Weight:  [86 kg (189 lb 9.5 oz)] 86 kg (189 lb 9.5 oz) (07/20 0816) Weight change:   Intake/Output from previous day: 07/19 0701 - 07/20 0700 In: 1200 [P.O.:1200] Out: 600 [Urine:600]  Intake/Output from this shift: Total I/O In: 240 [P.O.:240] Out: -   Medications: Current Facility-Administered Medications  Medication Dose Route Frequency Provider Last Rate Last Dose  . acetaminophen (TYLENOL) tablet 325-650 mg  325-650 mg Oral Q4H PRN Marinus Maw, MD      . amiodarone (PACERONE) tablet 300 mg  300 mg Oral Daily Marinus Maw, MD   300 mg at 07/10/12 1030  . aspirin EC tablet 81 mg  81 mg Oral Daily Marinus Maw, MD   81 mg at 07/10/12 1030  . carvedilol (COREG) tablet 12.5 mg  12.5 mg Oral Daily Marinus Maw, MD   12.5 mg at 07/10/12 1030  . ceFAZolin (ANCEF) IVPB 2 g/50 mL premix  2 g Intravenous STAT Roger A Arguello, PA-C   2 g at 07/09/12 1845  . cephALEXin (KEFLEX) capsule 500 mg  500 mg Oral BID Marinus Maw, MD   500 mg at 07/10/12 1030  . enalapril (VASOTEC) tablet 2.5 mg  2.5 mg Oral QHS Herby Abraham, PHARMD   2.5 mg at 07/09/12 2121  . enalapril (VASOTEC) tablet 5 mg  5 mg Oral Daily Herby Abraham, PHARMD   5 mg at 07/10/12 1030  . furosemide (LASIX) tablet 60 mg  60 mg Oral Daily Marinus Maw, MD   60 mg at 07/10/12 1030  . HYDROcodone-acetaminophen (NORCO/VICODIN) 5-325 MG per tablet 1-2 tablet  1-2 tablet Oral Q4H PRN Marinus Maw, MD   2 tablet at 07/09/12 0515  . LORazepam (ATIVAN) tablet 0.25 mg  0.25 mg Oral  Q8H Marinus Maw, MD   0.25 mg at 07/10/12 0553  . ondansetron (ZOFRAN) injection 4 mg  4 mg Intravenous Q6H PRN Marinus Maw, MD      . potassium chloride SA (K-DUR,KLOR-CON) CR tablet 20 mEq  20 mEq Oral Daily Marinus Maw, MD   20 mEq at 07/10/12 1030  . rosuvastatin (CRESTOR) tablet 10 mg  10 mg Oral q1800 Marinus Maw, MD   10 mg at 07/09/12 0981  . spironolactone (ALDACTONE) tablet 25 mg  25 mg Oral Daily Marinus Maw, MD   25 mg at 07/10/12 1030  . vitamin B-12 (CYANOCOBALAMIN) tablet 1,000 mcg  1,000 mcg Oral Daily Marinus Maw, MD   1,000 mcg at 07/10/12 1030    Physical Exam: General appearance: alert, cooperative and no distress Neck: no adenopathy, no carotid bruit, no JVD, supple, symmetrical, trachea midline and thyroid not enlarged, symmetric, no tenderness/mass/nodules Lungs: clear to auscultation bilaterally Heart: regular rate and rhythm, S1, S2 normal, no murmur, click, rub or gallop Abdomen: soft, non-tender; bowel sounds normal; no masses,  no organomegaly Extremities: extremities normal, atraumatic, no cyanosis or edema Pulses: 2+ and symmetric Skin: Skin color, texture, turgor normal. No rashes or lesions  or There is consderable reddish-brownish drinage that is cloudy, but not malodorous. Neurologic: Alert and oriented X 3, normal strength and tone. Normal symmetric reflexes. Normal coordination and gait  Lab Results: Results for orders placed during the hospital encounter of 07/08/12 (from the past 48 hour(s))  CBC     Status: Abnormal   Collection Time   07/08/12 12:30 PM      Component Value Range Comment   WBC 6.6  4.0 - 10.5 K/uL    RBC 3.74 (*) 4.22 - 5.81 MIL/uL    Hemoglobin 11.9 (*) 13.0 - 17.0 g/dL    HCT 08.6 (*) 57.8 - 52.0 %    MCV 92.8  78.0 - 100.0 fL    MCH 31.8  26.0 - 34.0 pg    MCHC 34.3  30.0 - 36.0 g/dL    RDW 46.9  62.9 - 52.8 %    Platelets 198  150 - 400 K/uL   BASIC METABOLIC PANEL     Status: Abnormal   Collection Time    07/08/12 12:30 PM      Component Value Range Comment   Sodium 129 (*) 135 - 145 mEq/L    Potassium 4.2  3.5 - 5.1 mEq/L    Chloride 90 (*) 96 - 112 mEq/L    CO2 25  19 - 32 mEq/L    Glucose, Bld 95  70 - 99 mg/dL    BUN 16  6 - 23 mg/dL    Creatinine, Ser 4.13  0.50 - 1.35 mg/dL    Calcium 9.6  8.4 - 24.4 mg/dL    GFR calc non Af Amer 62 (*) >90 mL/min    GFR calc Af Amer 72 (*) >90 mL/min   TYPE AND SCREEN     Status: Normal   Collection Time   07/08/12 12:40 PM      Component Value Range Comment   ABO/RH(D) O POS      Antibody Screen NEG      Sample Expiration 07/11/2012     PROTIME-INR     Status: Normal   Collection Time   07/08/12 12:49 PM      Component Value Range Comment   Prothrombin Time 13.9  11.6 - 15.2 seconds    INR 1.05  0.00 - 1.49     Imaging: Dg Chest 2 View  07/09/2012  *RADIOLOGY REPORT*  Clinical Data: Removal of pacemaker and leads  CHEST - 2 VIEW  Comparison: 06/25/2012  Findings: Interval removal of left subclavian pacemaker / AICD and leads. Enlargement of cardiac silhouette post CABG. Calcified tortuous aorta. Pulmonary vascular congestion. Gaseous density projecting over the upper lateral left chest likely represents the surgical site at the anterior chest wall post generator removal. Probable emphysematous changes. Minimal bibasilar atelectasis. No definite infiltrate, pleural effusion or pneumothorax.  IMPRESSION: Enlargement of cardiac silhouette post CABG. Question emphysematous changes. Minimal bibasilar atelectasis.  Original Report Authenticated By: Lollie Marrow, M.D.    Assessment:  1. Active Problems: 2.  * No active hospital problems. *  3.   Plan:  1. There is still substantial drainage from device pocket. The wick was withdrawn about 2-3 cm and a new dressing applied. Not ready for hospital discharge. May need to arrange WoundVac.   Length of Stay:  LOS: 2 days    [name] 07/10/2012, 11:57 AM

## 2012-07-11 MED ORDER — FUROSEMIDE 80 MG PO TABS
80.0000 mg | ORAL_TABLET | Freq: Every day | ORAL | Status: DC
  Administered 2012-07-11 – 2012-07-12 (×2): 80 mg via ORAL
  Filled 2012-07-11 (×2): qty 1

## 2012-07-11 NOTE — Progress Notes (Signed)
[facility]  DAILY PROGRESS NOTE   Subjective:  Site feels a little less sore. Saturated dressing completely, but this time in 24 h rather than 12 hours. Still able to express plenty of secretions from wound, but less than half yesterday's quantity. Wick pulled back another 2-3 cm. New dressing applied. He has noticed some increased dyspnea when putting on underwear.  Objective:  Temp:  [98.2 F (36.8 C)-98.9 F (37.2 C)] 98.2 F (36.8 C) (07/21 0432) Pulse Rate:  [57-61] 61  (07/21 0432) Resp:  [18] 18  (07/21 0432) BP: (101-124)/(61-78) 106/69 mmHg (07/21 0432) SpO2:  [92 %-95 %] 92 % (07/21 0432) Weight change:   Intake/Output from previous day: 07/20 0701 - 07/21 0700 In: 720 [P.O.:720] Out: 950 [Urine:950]  Intake/Output from this shift:    Medications: Current Facility-Administered Medications  Medication Dose Route Frequency Provider Last Rate Last Dose  . acetaminophen (TYLENOL) tablet 325-650 mg  325-650 mg Oral Q4H PRN Marinus Maw, MD   650 mg at 07/10/12 1759  . amiodarone (PACERONE) tablet 300 mg  300 mg Oral Daily Marinus Maw, MD   300 mg at 07/10/12 1030  . aspirin EC tablet 81 mg  81 mg Oral Daily Marinus Maw, MD   81 mg at 07/10/12 1030  . carvedilol (COREG) tablet 12.5 mg  12.5 mg Oral Daily Marinus Maw, MD   12.5 mg at 07/10/12 1030  . cephALEXin (KEFLEX) capsule 500 mg  500 mg Oral BID Marinus Maw, MD   500 mg at 07/10/12 2114  . enalapril (VASOTEC) tablet 2.5 mg  2.5 mg Oral QHS Herby Abraham, PHARMD   2.5 mg at 07/10/12 2114  . enalapril (VASOTEC) tablet 5 mg  5 mg Oral Daily Herby Abraham, PHARMD   5 mg at 07/10/12 1030  . furosemide (LASIX) tablet 60 mg  60 mg Oral Daily Marinus Maw, MD   60 mg at 07/10/12 1030  . HYDROcodone-acetaminophen (NORCO/VICODIN) 5-325 MG per tablet 1-2 tablet  1-2 tablet Oral Q4H PRN Marinus Maw, MD   2 tablet at 07/11/12 0444  . LORazepam (ATIVAN) tablet 0.25 mg  0.25 mg  Oral Q8H Marinus Maw, MD   0.25 mg at 07/11/12 0629  . ondansetron (ZOFRAN) injection 4 mg  4 mg Intravenous Q6H PRN Marinus Maw, MD      . potassium chloride SA (K-DUR,KLOR-CON) CR tablet 20 mEq  20 mEq Oral Daily Marinus Maw, MD   20 mEq at 07/10/12 1030  . rosuvastatin (CRESTOR) tablet 10 mg  10 mg Oral q1800 Marinus Maw, MD   10 mg at 07/10/12 1759  . spironolactone (ALDACTONE) tablet 25 mg  25 mg Oral Daily Marinus Maw, MD   25 mg at 07/10/12 1030  . vitamin B-12 (CYANOCOBALAMIN) tablet 1,000 mcg  1,000 mcg Oral Daily Marinus Maw, MD   1,000 mcg at 07/10/12 1030    Physical Exam: General appearance: alert, cooperative and no distress  Neck: no adenopathy, no carotid bruit, no JVD, supple, symmetrical, trachea midline and thyroid not enlarged, symmetric, no tenderness/mass/nodules  Lungs: clear to auscultation bilaterally  Heart: regular rate and rhythm, S1, S2 normal, no murmur, click, rub or gallop  Abdomen: soft, non-tender; bowel sounds normal; no masses, no organomegaly  Extremities: extremities normal, atraumatic, no cyanosis or edema  Pulses: 2+ and symmetric  Skin: Skin color, texture, turgor normal. No rashes or lesions or There  is consderable reddish-brownish drinage that is cloudy, but not malodorous.  Neurologic: Alert and oriented X 3, normal strength and tone. Normal symmetric reflexes. Normal coordination and gait   Lab Results: No results found for this or any previous visit (from the past 48 hour(s)).  Imaging: No results found.  Assessment:   Plan:  1. Not ready for discharge. Still a lot of drainage and now a little dyspneic, possibly due to loss of CRT support. Depending on amount of drainage tomorrow will decide whether he needs a WoundVac. Increase diuretic dose.   Length of Stay:  LOS: 3 days    Trung Wenzl 07/11/2012, 8:53 AM

## 2012-07-12 ENCOUNTER — Encounter (HOSPITAL_COMMUNITY): Payer: Self-pay | Admitting: Cardiology

## 2012-07-12 DIAGNOSIS — T827XXA Infection and inflammatory reaction due to other cardiac and vascular devices, implants and grafts, initial encounter: Secondary | ICD-10-CM

## 2012-07-12 DIAGNOSIS — Z95810 Presence of automatic (implantable) cardiac defibrillator: Secondary | ICD-10-CM | POA: Diagnosis present

## 2012-07-12 HISTORY — DX: Infection and inflammatory reaction due to other cardiac and vascular devices, implants and grafts, initial encounter: T82.7XXA

## 2012-07-12 MED ORDER — CEPHALEXIN 500 MG PO CAPS: 500.0000 mg | ORAL_CAPSULE | Freq: Two times a day (BID) | ORAL | Status: AC

## 2012-07-12 NOTE — Progress Notes (Addendum)
     Patient: [name] Date of Encounter: 07/12/2012, 9:43 AM Admit date: 07/08/2012     Subjective  [name] has no complaints this AM. He specifically denies chest pain, SOB or palpitations. He reports his explant site is feeling better with much less soreness.   Objective  Physical Exam: Vitals: BP 116/73  Pulse 66  Temp 98.1 F (36.7 C) (Oral)  Resp 18  Ht 6\' 1"  (1.854 m)  Wt 189 lb 9.5 oz (86 kg)  BMI 25.01 kg/m2  SpO2 98% General: Well developed, well appearing 71 year old male in no acute distress. Neck: Supple. JVD not elevated. Lungs: Clear bilaterally to auscultation without wheezes, rales, or rhonchi. Breathing is unlabored. Heart: RRR S1 S2 without murmur, rub or gallop.  Abdomen: Soft, non-distended. Extremities: No clubbing or cyanosis. No edema.  Distal pedal pulses are 2+ and equal bilaterally. Neuro: Alert and oriented X 3. Moves all extremities spontaneously. No focal deficits. Skin: Explant site/left upper chest with saturated but small dressing with serosanguinous drainage. No significant bleeding or hematoma.  Intake/Output: Intake/Output Summary (Last 24 hours) at 07/12/12 0943 Last data filed at 07/11/12 1000  Gross per 24 hour  Intake    240 ml  Output      0 ml  Net    240 ml   Inpatient Medications:  . amiodarone  300 mg Oral Daily  . aspirin EC  81 mg Oral Daily  . carvedilol  12.5 mg Oral Daily  . cephalex  500 mg Oral BID  . enalapril  2.5 mg Oral QHS  . enalapril  5 mg Oral Daily  . furosemide  80 mg Oral Daily  . lorazepam  0.25 mg Oral Q8H  . potassium chloride SA  20 mEq Oral Daily  . rosuvastatin  10 mg Oral q1800  . spironolactone  25 mg Oral Daily  . vitamin B-12  1,000 mcg Oral Daily   Labs: None in last 48 hours  Radiology/Studies: None in last 48 hours  Telemetry: sinus rhythm; no arrhythmias   Assessment and Plan  1. 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 His drainage is still present but has improved over the weekend. He will need to continue antibiotic therapy and return in 3-4 weeks for reimplantation of BiV ICD system on right side. He has never received any therapy/ICD shock so Dr. Johney Frame did not feel LifeVest was necessary.  Dr. Graciela Husbands to see and make further recommendations. Signed, Rick Duff PA-C  As above Will discharge and arrange followup with Dr Johney Frame on Wednesday  Po ABx   Sherryl Manges, MD 07/12/2012 11:43 AM  ADDENDUM: [name] is at high risk for VT/VF given his known LV dysfunction. Per patient, patient's daughter and Dr. Graciela Husbands, will arrange for LifeVest placement prior to discharge.

## 2012-07-12 NOTE — Progress Notes (Signed)
IV removed, catheter intact.  Pt denies complaints, understands previously given discharge instructions.  PT escorted to lobby via WC.

## 2012-07-12 NOTE — Progress Notes (Signed)
Pt. Seen and examined. Agree with the NP/PA-C note as written. Wound still draining serosangionous fluid. Their may be a role for wound vac. Will defer to Maynard EP. LifeVest is appropriate for discharge.  Chrystie Nose, MD, [facility] Attending Cardiologist [facility]

## 2012-07-12 NOTE — Progress Notes (Signed)
Pt d/c home on hold pending life vest; pt needs to be fitted prior to d/c home; pt and wife made aware; will cont. To monitor.

## 2012-07-12 NOTE — Progress Notes (Signed)
Life Vest rep in room at this time with pt and wife; pt will be d/c home afterwards.

## 2012-07-12 NOTE — Progress Notes (Signed)
Subjective: No specific complaints.  No chest pain  Objective: Vital signs in last 24 hours: Temp:  [98.1 F (36.7 C)] 98.1 F (36.7 C) (07/22 0453) Pulse Rate:  [66] 66  (07/22 0453) Resp:  [18] 18  (07/22 0453) BP: (116)/(73) 116/73 mmHg (07/22 0453) SpO2:  [98 %] 98 % (07/22 0453) Weight change:  Last BM Date: 07/11/12 Intake/Output from previous day:+240 07/21 0701 - 07/22 0700 In: 240 [P.O.:240] Out: -  Intake/Output this shift:    PE: General:alert and oriented X 3, MAE, pleasant affect Skin:Lt. Previous ICD site with mostly saturated dressing, serous sang drainage. Heart:S1S2 RRR Lungs:clear bilaterally Abd:+ BS, soft, non tender Ext:no edema    Lab Results:  BMET   Lab Results  Component Value Date   CHOL  Value: 195        ATP III CLASSIFICATION:  <200     mg/dL   Desirable  161-096  mg/dL   Borderline High  >=045    mg/dL   High        4/0/9811   HDL 25* 07/25/2009   LDLCALC  Value: 146        Total Cholesterol/HDL:CHD Risk Coronary Heart Disease Risk Table                     Men   Women  1/2 Average Risk   3.4   3.3  Average Risk       5.0   4.4  2 X Average Risk   9.6   7.1  3 X Average Risk  23.4   11.0        Use the calculated Patient Ratio above and the CHD Risk Table to determine the patient's CHD Risk.        ATP III CLASSIFICATION (LDL):  <100     mg/dL   Optimal  914-782  mg/dL   Near or Above                    Optimal  130-159  mg/dL   Borderline  956-213  mg/dL   High  >086     mg/dL   Very High* 04/27/8468   TRIG 121 07/25/2009   CHOLHDL 7.8 07/25/2009   Lab Results  Component Value Date   HGBA1C  Value: 5.4 (NOTE) The ADA recommends the following therapeutic goal for glycemic control related to Hgb A1c measurement: Goal of therapy: <6.5 Hgb A1c  Reference: American Diabetes Association: Clinical Practice Recommendations 2010, Diabetes Care, 2010, 33: (Suppl  1). 06/24/2009          EKG: Orders placed during the hospital encounter of 07/08/12  . EKG  12-LEAD  . EKG 12-LEAD  . EKG 12-LEAD  . EKG 12-LEAD  . EKG 12-LEAD    Studies/Results: No results found.  Medications: I have reviewed the patient's current medications.    [name] amiodarone  300 mg Oral Daily  . aspirin EC  81 mg Oral Daily  . carvedilol  12.5 mg Oral Daily  . cephALEXin  500 mg Oral BID  . enalapril  2.5 mg Oral QHS  . enalapril  5 mg Oral Daily  . furosemide  80 mg Oral Daily  . LORazepam  0.25 mg Oral Q8H  . potassium chloride SA  20 mEq Oral Daily  . rosuvastatin  10 mg Oral q1800  . spironolactone  25 mg Oral Daily  . vitamin B-12  1,000 mcg Oral Daily  . DISCONTD: furosemide  60 mg Oral Daily   Assessment/Plan: Patient Active Problem List  Diagnosis  . HYPOTHYROIDISM  . HYPERLIPIDEMIA  . GOUT  . ALCOHOLISM  . HYPERTENSION  . CAD, NATIVE VESSEL  . CARDIOMYOPATHY, ISCHEMIC  . ATRIAL FIBRILLATION  . COMBINED HEART FAILURE, CHRONIC  . CEREBROVASCULAR DISEASE  . PERIPHERAL VASCULAR DISEASE  . RENAL INSUFFICIENCY, CHRONIC  . ANEMIA, HX OF  . CAROTID ENDARTERECTOMY, LEFT, HX OF  . CORONARY ARTERY BYPASS GRAFT, HX OF  . ICD (implantable cardiac defibrillator) infection, s/p removal of ICD   PLAN: ? Wound vac.  Dr. Johney Frame to see.  LOS: 4 days   Abdou Stocks R 07/12/2012, 8:31 AM

## 2012-07-13 MED FILL — Cephalexin Cap 500 MG: ORAL | Qty: 1 | Status: AC

## 2012-07-13 MED FILL — Enalapril Maleate Tab 2.5 MG: ORAL | Qty: 1 | Status: AC

## 2012-07-13 MED FILL — Lorazepam Tab 0.5 MG: ORAL | Qty: 1 | Status: AC

## 2012-07-13 MED FILL — Rosuvastatin Calcium Tab 10 MG: ORAL | Qty: 1 | Status: AC

## 2012-07-14 ENCOUNTER — Ambulatory Visit: Payer: Medicare Other | Admitting: *Deleted

## 2012-07-15 ENCOUNTER — Ambulatory Visit (INDEPENDENT_AMBULATORY_CARE_PROVIDER_SITE_OTHER): Payer: Medicare Other | Admitting: *Deleted

## 2012-07-15 DIAGNOSIS — I428 Other cardiomyopathies: Secondary | ICD-10-CM

## 2012-07-15 MED ORDER — COLCHICINE 0.6 MG PO TABS: ORAL_TABLET | ORAL | Status: DC

## 2012-07-15 NOTE — Addendum Note (Signed)
Addended by: Vella Kohler on: 07/15/2012 11:18 AM   Modules accepted: Orders

## 2012-07-15 NOTE — Patient Instructions (Addendum)
Pt to wash site area twice a day with hot, soapy water and keep covered with bandaid.     Dr [name] has given you a medication to take as needed for gout

## 2012-07-15 NOTE — Progress Notes (Signed)
Wound check only

## 2012-07-19 ENCOUNTER — Other Ambulatory Visit: Payer: Self-pay | Admitting: Internal Medicine

## 2012-07-19 ENCOUNTER — Ambulatory Visit: Payer: Medicare Other

## 2012-07-19 ENCOUNTER — Encounter: Payer: Self-pay | Admitting: *Deleted

## 2012-07-19 DIAGNOSIS — I255 Ischemic cardiomyopathy: Secondary | ICD-10-CM

## 2012-07-29 NOTE — H&P (Signed)
Patient ID: [name]  MRN: [id number], DOB/AGE: 05/20/1941  Date of Visit: 05/26/2012  Primary Cardiologist: Dr. Susa Griffins  Reason for Visit: Device follow-up  History of Present Illness  [name] is a pleasant 71 year old gentleman with an ischemic cardiomyopathy s/p BiV ICD implantation, CAD s/p CABG 1993, redo CABG 2000, AS s/p AVR 2010, PVD, HTN and chronic renal insufficiency who presents today for device follow-up, referred by Dr. Alanda Amass for generator change as his battery is now at [facility]. Mr. Mamone reports he has been feeling well and he has no complaints. He denies chest pain, shortness of breath, palpitations, dizziness or syncope. He denies pedal edema or abdominal swelling, orthopnea or PND.  Past Medical History   Diagnosis  Date   .  CAD     s/p CABG 1993, redo CABG 2000, LHC in 2010 shows patent grafts    .  AS s/p AVR May 2010    .  Chronic renal insufficiency    .  Hypertension    .  Hyperlipidemia    .  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  History of atrial flutter      ablation in sinus rhythm   .  GERD (gastroesophageal reflux disease)      with gas and bloating probably cause of chest pain   .  Ischemic cardiomyopathy      EF of 30% s/p BiV ICD implantation 2008 ([employer])    Echo done August 2010, EF 20-25%    .  Gout     Past Surgical History   Procedure  Date   .  Coronary artery bypass graft  1993     redo in 2000, he has had multiple MI previous to both procedures.   .  Cardiac catheterization  2004, 2006, 2010     with a presentation of atrial flutter with rapid ventricular response in 2004. He had a cath and two stents to his PDA after his SVG to his RCA. His other graft were patent. He had LV with EF of 30%.   .  Carotid endarterectomy      left in December 2003 and know renal artery stenosis of 60-70%   Allergies/Intolerances  Allergies   Allergen  Reactions   .  Atorvastatin      Current Home Medications  Current Outpatient Prescriptions   Medication  Sig  Dispense  Refill   .  allopurinol (ZYLOPRIM) 300 MG tablet  Take 300 mg by mouth daily.     [name]  aspirin 81 MG tablet  Take 81 mg by mouth daily.     [name]  atenolol (TENORMIN) 50 MG tablet  Take 50 mg by mouth daily.     .  clopidogrel (PLAVIX) 75 MG tablet  Take 75 mg by mouth daily.     .  digoxin (LANOXIN) 0.125 MG tablet  Take 125 mcg by mouth daily.     [name]  diltiazem (DILACOR XR) 180 MG 24 hr capsule  Take 180 mg by mouth daily.     .  enalapril (VASOTEC) 2.5 MG tablet  Take 2.5 mg by mouth daily.     .  furosemide (LASIX) 20 MG tablet  Take 20 mg by mouth daily.     .  nitroGLYCERIN (NITROSTAT) 0.4 MG SL tablet  Place 0.4 mg under the tongue every 5 (five) minutes as needed.     [name]  pantoprazole (PROTONIX) 40 MG tablet  Take 40 mg by mouth daily.     .  Potassium Chloride (KLOR-CON 10 PO)  Take 10 mEq by mouth daily.     .  rosuvastatin (CRESTOR) 10 MG tablet  Take 10 mg by mouth daily.      Social History  Social History   .  Marital Status:  Married     Spouse Name:  N/A     Number of Children:  N/A   .  Years of Education:  N/A    Social History Main Topics   .  Smoking status:  Former Games developer   .  Smokeless tobacco:  Not on file   .  Alcohol Use:  Not on file   .  Drug Use:  Not on file   .  Sexually Active:  Not on file   Review of Systems  General: No chills, fever, night sweats or weight changes  Cardiovascular: No chest pain, dyspnea on exertion, edema, orthopnea, palpitations, paroxysmal nocturnal dyspnea  Dermatological: No rash, lesions or masses  Respiratory: No cough, dyspnea  Urologic: No hematuria, dysuria  Abdominal: No nausea, vomiting, diarrhea, bright red blood per rectum, melena, or hematemesis  Neurologic: No visual changes, weakness, changes in mental status  All other systems reviewed and are otherwise negative except as noted above.  Physical Exam  Blood pressure 140/87,  pulse 73, height 6\' 1"  (1.854 m), weight 195 lb 6.4 oz (88.633 kg).  General: Well developed, well appearing 71 year old male, in no acute distress.  HEENT: Normocephalic, atraumatic. EOMs intact. Sclera nonicteric. Oropharynx clear.  Neck: Supple without bruits. No JVD.  Lungs: Respirations regular and unlabored, CTA bilaterally. No wheezes, rales or rhonchi.  Heart: RRR. S1, S2 present. No murmurs, rub, S3 or S4.  Abdomen: Soft, non-distended.  Extremities: No clubbing, cyanosis or edema. DP/PT/Radials 2+ and equal bilaterally.  Psych: Normal affect.  Neuro: Alert and oriented X 3. Moves all extremities spontaneously.  Diagnostics/Studies  12-lead ECG shows AV pacing at 70 bpm  Device interrogation today shows normal BiV ICD function with stable lead parameters; battery at ERI (2.62V); there were no episodes; no programming changes made; see P[employer] report  Assessment and Plan  1. BiV ICD battery at [facility] - will need generator change in the next few weeks; will repeat an echocardiogram (if not already ordered by Dr. Alanda Amass) to assess LV function prior to change out. If his LV function has improved, will plan for change out to BiV PPM; if his LV function remains <35%, he will need generator change for BiV ICD  2. Ischemic cardiomyopathy - patient appears euvolemic by exam today; followed by Dr. Alanda Amass  This plan of care was formulated with Dr. Lewayne Bunting who was in to see the patient with me.  Signed,  Rick Duff, PA-C  05/26/2012, 12:57 PM  EP Attending  Patient seen and examined. I have discussed the risks/benefits/goals/expectations of the procedure with the patient and he wishes to proceed. Lewayne Bunting, M.D.

## 2012-08-11 ENCOUNTER — Other Ambulatory Visit: Payer: Medicare Other

## 2012-08-13 ENCOUNTER — Other Ambulatory Visit (INDEPENDENT_AMBULATORY_CARE_PROVIDER_SITE_OTHER): Payer: Medicare Other

## 2012-08-13 ENCOUNTER — Encounter: Payer: Self-pay | Admitting: Internal Medicine

## 2012-08-13 ENCOUNTER — Ambulatory Visit (INDEPENDENT_AMBULATORY_CARE_PROVIDER_SITE_OTHER): Payer: Medicare Other | Admitting: Internal Medicine

## 2012-08-13 VITALS — BP 132/80 | HR 64 | Ht 73.0 in | Wt 192.0 lb

## 2012-08-13 DIAGNOSIS — I10 Essential (primary) hypertension: Secondary | ICD-10-CM

## 2012-08-13 DIAGNOSIS — I4891 Unspecified atrial fibrillation: Secondary | ICD-10-CM

## 2012-08-13 LAB — CBC WITH DIFFERENTIAL/PLATELET
Eosinophils Absolute: 0.2 10*3/uL (ref 0.0–0.7)
Eosinophils Relative: 4 % (ref 0–5)
HCT: 36 % — ABNORMAL LOW (ref 39.0–52.0)
Hemoglobin: 12.6 g/dL — ABNORMAL LOW (ref 13.0–17.0)
Lymphs Abs: 0.8 10*3/uL (ref 0.7–4.0)
MCH: 31.7 pg (ref 26.0–34.0)
MCV: 90.5 fL (ref 78.0–100.0)
Monocytes Relative: 17 % — ABNORMAL HIGH (ref 3–12)
RBC: 3.98 MIL/uL — ABNORMAL LOW (ref 4.22–5.81)

## 2012-08-13 NOTE — Progress Notes (Signed)
HPI [name] returns today for followup. He is a very pleasant 71 year old man with an ischemic cardiomyopathy, chronic systolic heart failure, status post ICD implantation. The patient was found to have a occult pocket infection at the time of his generator change several weeks ago. He ultimately underwent extraction of his entire biventricular system. He is now healed completely. He denies chest pain but has class 2-3 heart failure symptoms. No syncope. No fevers or chills. Allergies  Allergen Reactions  . Atorvastatin Other (See Comments)    Joint pain.     Current Outpatient Prescriptions  Medication Sig Dispense Refill  . amiodarone (PACERONE) 200 MG tablet Take 300 mg by mouth daily.      [name] aspirin EC 81 MG tablet Take 81 mg by mouth daily.      . carvedilol (COREG) 12.5 MG tablet Take 12.5 mg by mouth daily.       . enalapril (VASOTEC) 5 MG tablet Take 2.5-5 mg by mouth 2 (two) times daily. Take 1 tablet every morning and take  tablet every evening.      . furosemide (LASIX) 40 MG tablet Take 60 mg by mouth daily.      [name] LORazepam (ATIVAN) 0.5 MG tablet Take 0.25 mg by mouth every 8 (eight) hours.       . magnesium oxide (MAG-OX) 400 MG tablet Take 400 mg by mouth 2 (two) times daily.      . nitroGLYCERIN (NITROSTAT) 0.4 MG SL tablet Place 0.4 mg under the tongue every 5 (five) minutes as needed.      . potassium chloride SA (K-DUR,KLOR-CON) 20 MEQ tablet Take 20 mEq by mouth daily.      . rosuvastatin (CRESTOR) 10 MG tablet Take 10 mg by mouth daily.      [name] spironolactone (ALDACTONE) 25 MG tablet Take 25 mg by mouth daily.      . vitamin B-12 (CYANOCOBALAMIN) 1000 MCG tablet Take 1,000 mcg by mouth daily.         Past Medical History  Diagnosis Date  . Chest pain   . Hypertension   . Hyperlipidemia   . 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 H/O atrial flutter     ablation in sinus rhythm   . GERD (gastroesophageal reflux disease)    with gas and bloating probably cause of chest pain  . Ischemic cardiomyopathy     H/O with EF at this cath of 45%  . H/O: gout   . ICD (implantable cardiac defibrillator) infection, s/p removal of ICD 07/12/2012    ROS:   All systems reviewed and negative except as noted in the HPI.   Past Surgical History  Procedure Date  . Coronary artery bypass graft 1993    redo in 2000, he has had multiple MI previous to both procedures.  . Cardiac catheterization Dec 18, 2003    with a presentation of atrial flutter with rapid ventricular response. He had a cath and two stents to his PDA after his SVG to his RCA. His other graft were patent. He had LV with EF of 30%.   . Carotid endarterectomy     left in December 2003 and know renal artery stenosis of 60-70%  . Valve replacement 2010     No family history on file.   History   Social History  . Marital Status: Married    Spouse Name: N/A    Number of Children: N/A  .  Years of Education: N/A   Occupational History  . Not on file.   Social History Main Topics  . Smoking status: Never Smoker   . Smokeless tobacco: Not on file  . Alcohol Use: Not on file  . Drug Use: Not on file  . Sexually Active: Not on file   Other Topics Concern  . Not on file   Social History Narrative  . No narrative on file     BP 132/80  Pulse 64  Ht 6\' 1"  (1.854 m)  Wt 192 lb (87.091 kg)  BMI 25.33 kg/m2  Physical Exam:  Well appearing 71 year old man, NAD HEENT: Unremarkable Neck:  No JVD, no thyromegally Lungs:  Clear with no wheezes, rales, or rhonchi. Explanted ICD pocket healing nicely with a small area of hematoma still present no drainage HEART:  Regular rate rhythm, no murmurs, no rubs, no clicks S2 is split Abd:  soft, positive bowel sounds, no organomegally, no rebound, no guarding Ext:  2 plus pulses, no edema, no cyanosis, no clubbing Skin:  No rashes no nodules Neuro:  CN II through XII intact, motor grossly intact  EKG   Normal sinus rhythm with left bundle branch block  Assess/Plan:

## 2012-08-13 NOTE — Patient Instructions (Signed)
See instruction sheet

## 2012-08-14 LAB — BASIC METABOLIC PANEL
Chloride: 87 mEq/L — ABNORMAL LOW (ref 96–112)
Potassium: 4.4 mEq/L (ref 3.5–5.3)
Sodium: 124 mEq/L — ABNORMAL LOW (ref 135–145)

## 2012-08-16 ENCOUNTER — Encounter (HOSPITAL_COMMUNITY): Payer: Self-pay | Admitting: Pharmacy Technician

## 2012-08-17 MED ORDER — SODIUM CHLORIDE 0.9 % IV SOLN
250.0000 mL | INTRAVENOUS | Status: DC
  Administered 2012-08-18: 07:00:00 via INTRAVENOUS

## 2012-08-17 MED ORDER — CEFAZOLIN SODIUM-DEXTROSE 2-3 GM-% IV SOLR
2.0000 g | INTRAVENOUS | Status: AC
  Administered 2012-08-18: 2 g via INTRAVENOUS
  Filled 2012-08-17 (×2): qty 50

## 2012-08-17 MED ORDER — CHLORHEXIDINE GLUCONATE 4 % EX LIQD
60.0000 mL | Freq: Once | CUTANEOUS | Status: DC
  Filled 2012-08-17: qty 60

## 2012-08-17 MED ORDER — SODIUM CHLORIDE 0.9 % IJ SOLN: 3.0000 mL | INTRAMUSCULAR | Status: DC | PRN

## 2012-08-17 MED ORDER — SODIUM CHLORIDE 0.9 % IR SOLN
80.0000 mg | Status: AC
  Administered 2012-08-18: 80 mg
  Filled 2012-08-17 (×2): qty 2

## 2012-08-17 MED ORDER — SODIUM CHLORIDE 0.9 % IJ SOLN: 3.0000 mL | Freq: Two times a day (BID) | INTRAMUSCULAR | Status: DC

## 2012-08-17 MED ORDER — SODIUM CHLORIDE 0.45 % IV SOLN
INTRAVENOUS | Status: DC
  Administered 2012-08-18: 07:00:00 via INTRAVENOUS

## 2012-08-18 ENCOUNTER — Encounter (HOSPITAL_COMMUNITY): Payer: Self-pay | Admitting: *Deleted

## 2012-08-18 ENCOUNTER — Encounter (HOSPITAL_COMMUNITY)
Admission: RE | Disposition: A | Discharge status: Home / Self Care | Payer: Self-pay | Source: Ambulatory Visit | Attending: Internal Medicine

## 2012-08-18 ENCOUNTER — Ambulatory Visit (HOSPITAL_COMMUNITY)
Admission: RE | Admit: 2012-08-18 | Discharge: 2012-08-19 | Disposition: A | Discharge status: Home / Self Care | Payer: Medicare Other | Source: Ambulatory Visit | Attending: Internal Medicine | Admitting: Internal Medicine

## 2012-08-18 DIAGNOSIS — I4891 Unspecified atrial fibrillation: Secondary | ICD-10-CM

## 2012-08-18 DIAGNOSIS — K219 Gastro-esophageal reflux disease without esophagitis: Secondary | ICD-10-CM | POA: Insufficient documentation

## 2012-08-18 DIAGNOSIS — Z862 Personal history of diseases of the blood and blood-forming organs and certain disorders involving the immune mechanism: Secondary | ICD-10-CM

## 2012-08-18 DIAGNOSIS — Z9889 Other specified postprocedural states: Secondary | ICD-10-CM

## 2012-08-18 DIAGNOSIS — T827XXA Infection and inflammatory reaction due to other cardiac and vascular devices, implants and grafts, initial encounter: Secondary | ICD-10-CM

## 2012-08-18 DIAGNOSIS — Z95810 Presence of automatic (implantable) cardiac defibrillator: Secondary | ICD-10-CM

## 2012-08-18 DIAGNOSIS — I2589 Other forms of chronic ischemic heart disease: Secondary | ICD-10-CM | POA: Insufficient documentation

## 2012-08-18 DIAGNOSIS — E785 Hyperlipidemia, unspecified: Secondary | ICD-10-CM | POA: Insufficient documentation

## 2012-08-18 DIAGNOSIS — I10 Essential (primary) hypertension: Secondary | ICD-10-CM | POA: Insufficient documentation

## 2012-08-18 DIAGNOSIS — I2510 Atherosclerotic heart disease of native coronary artery without angina pectoris: Secondary | ICD-10-CM

## 2012-08-18 DIAGNOSIS — F1020 Alcohol dependence, uncomplicated: Secondary | ICD-10-CM

## 2012-08-18 DIAGNOSIS — I5042 Chronic combined systolic (congestive) and diastolic (congestive) heart failure: Secondary | ICD-10-CM

## 2012-08-18 DIAGNOSIS — I679 Cerebrovascular disease, unspecified: Secondary | ICD-10-CM

## 2012-08-18 DIAGNOSIS — I5022 Chronic systolic (congestive) heart failure: Secondary | ICD-10-CM

## 2012-08-18 DIAGNOSIS — I739 Peripheral vascular disease, unspecified: Secondary | ICD-10-CM

## 2012-08-18 DIAGNOSIS — E039 Hypothyroidism, unspecified: Secondary | ICD-10-CM

## 2012-08-18 DIAGNOSIS — M109 Gout, unspecified: Secondary | ICD-10-CM

## 2012-08-18 DIAGNOSIS — I255 Ischemic cardiomyopathy: Secondary | ICD-10-CM

## 2012-08-18 DIAGNOSIS — I428 Other cardiomyopathies: Secondary | ICD-10-CM | POA: Insufficient documentation

## 2012-08-18 DIAGNOSIS — I447 Left bundle-branch block, unspecified: Secondary | ICD-10-CM | POA: Insufficient documentation

## 2012-08-18 DIAGNOSIS — N189 Chronic kidney disease, unspecified: Secondary | ICD-10-CM

## 2012-08-18 DIAGNOSIS — Z951 Presence of aortocoronary bypass graft: Secondary | ICD-10-CM

## 2012-08-18 HISTORY — DX: Angina pectoris, unspecified: I20.9

## 2012-08-18 HISTORY — DX: Heart failure, unspecified: I50.9

## 2012-08-18 HISTORY — PX: BI-VENTRICULAR IMPLANTABLE CARDIOVERTER DEFIBRILLATOR: SHX5459

## 2012-08-18 HISTORY — DX: Unspecified osteoarthritis, unspecified site: M19.90

## 2012-08-18 HISTORY — DX: Cardiac arrhythmia, unspecified: I49.9

## 2012-08-18 HISTORY — DX: Shortness of breath: R06.02

## 2012-08-18 HISTORY — DX: Anxiety disorder, unspecified: F41.9

## 2012-08-18 HISTORY — DX: Acute myocardial infarction, unspecified: I21.9

## 2012-08-18 LAB — SURGICAL PCR SCREEN: MRSA, PCR: NEGATIVE

## 2012-08-18 SURGERY — BI-VENTRICULAR IMPLANTABLE CARDIOVERTER DEFIBRILLATOR  (CRT-D)
Laterality: Right | Wound class: Clean

## 2012-08-18 MED ORDER — MIDAZOLAM HCL 5 MG/5ML IJ SOLN
INTRAMUSCULAR | Status: AC
  Filled 2012-08-18: qty 5

## 2012-08-18 MED ORDER — LORAZEPAM 0.5 MG PO TABS
0.2500 mg | ORAL_TABLET | Freq: Two times a day (BID) | ORAL | Status: DC
  Administered 2012-08-18 – 2012-08-19 (×2): 0.25 mg via ORAL
  Filled 2012-08-18 (×2): qty 1

## 2012-08-18 MED ORDER — CEPHALEXIN 500 MG PO CAPS: 500.0000 mg | ORAL_CAPSULE | Freq: Three times a day (TID) | ORAL | Status: DC

## 2012-08-18 MED ORDER — SPIRONOLACTONE 25 MG PO TABS
25.0000 mg | ORAL_TABLET | Freq: Every day | ORAL | Status: DC
  Administered 2012-08-18 – 2012-08-19 (×2): 25 mg via ORAL
  Filled 2012-08-18 (×2): qty 1

## 2012-08-18 MED ORDER — ACETAMINOPHEN 325 MG PO TABS
325.0000 mg | ORAL_TABLET | ORAL | Status: DC | PRN
  Administered 2012-08-18 – 2012-08-19 (×3): 650 mg via ORAL
  Filled 2012-08-18 (×3): qty 2

## 2012-08-18 MED ORDER — MUPIROCIN 2 % EX OINT
TOPICAL_OINTMENT | Freq: Two times a day (BID) | CUTANEOUS | Status: DC
  Administered 2012-08-18 – 2012-08-19 (×3): via NASAL
  Filled 2012-08-18 (×2): qty 22

## 2012-08-18 MED ORDER — FENTANYL CITRATE 0.05 MG/ML IJ SOLN
INTRAMUSCULAR | Status: AC
  Filled 2012-08-18: qty 2

## 2012-08-18 MED ORDER — FUROSEMIDE 40 MG PO TABS
60.0000 mg | ORAL_TABLET | Freq: Every day | ORAL | Status: DC
  Administered 2012-08-18 – 2012-08-19 (×2): 60 mg via ORAL
  Filled 2012-08-18 (×2): qty 1

## 2012-08-18 MED ORDER — MUPIROCIN 2 % EX OINT
TOPICAL_OINTMENT | CUTANEOUS | Status: AC
  Filled 2012-08-18: qty 22

## 2012-08-18 MED ORDER — CARVEDILOL 12.5 MG PO TABS
12.5000 mg | ORAL_TABLET | Freq: Two times a day (BID) | ORAL | Status: DC
  Administered 2012-08-18 – 2012-08-19 (×2): 12.5 mg via ORAL
  Filled 2012-08-18 (×4): qty 1

## 2012-08-18 MED ORDER — ASPIRIN EC 81 MG PO TBEC
81.0000 mg | DELAYED_RELEASE_TABLET | Freq: Every day | ORAL | Status: DC
  Administered 2012-08-18 – 2012-08-19 (×2): 81 mg via ORAL
  Filled 2012-08-18 (×2): qty 1

## 2012-08-18 MED ORDER — AMIODARONE HCL 200 MG PO TABS
300.0000 mg | ORAL_TABLET | Freq: Every day | ORAL | Status: DC
  Administered 2012-08-18 – 2012-08-19 (×2): 300 mg via ORAL
  Filled 2012-08-18 (×2): qty 1

## 2012-08-18 MED ORDER — VITAMIN B-12 1000 MCG PO TABS
1000.0000 ug | ORAL_TABLET | Freq: Every day | ORAL | Status: DC
  Administered 2012-08-18 – 2012-08-19 (×2): 1000 ug via ORAL
  Filled 2012-08-18 (×2): qty 1

## 2012-08-18 MED ORDER — ENALAPRIL MALEATE 5 MG PO TABS
5.0000 mg | ORAL_TABLET | Freq: Two times a day (BID) | ORAL | Status: DC
  Administered 2012-08-18 – 2012-08-19 (×3): 5 mg via ORAL
  Filled 2012-08-18 (×4): qty 1

## 2012-08-18 MED ORDER — ONDANSETRON HCL 4 MG/2ML IJ SOLN: 4.0000 mg | Freq: Four times a day (QID) | INTRAMUSCULAR | Status: DC | PRN

## 2012-08-18 MED ORDER — CEFAZOLIN SODIUM-DEXTROSE 2-3 GM-% IV SOLR
2.0000 g | Freq: Four times a day (QID) | INTRAVENOUS | Status: AC
  Administered 2012-08-18 (×3): 2 g via INTRAVENOUS
  Filled 2012-08-18 (×3): qty 50

## 2012-08-18 MED ORDER — POTASSIUM CHLORIDE CRYS ER 20 MEQ PO TBCR
20.0000 meq | EXTENDED_RELEASE_TABLET | Freq: Every day | ORAL | Status: DC
  Administered 2012-08-18 – 2012-08-19 (×2): 20 meq via ORAL
  Filled 2012-08-18 (×3): qty 1

## 2012-08-18 NOTE — H&P (View-Only) (Signed)
HPI [name] returns today for followup. He is a very pleasant 71-year-old man with an ischemic cardiomyopathy, chronic systolic heart failure, status post ICD implantation. The patient was found to have a occult pocket infection at the time of his generator change several weeks ago. He ultimately underwent extraction of his entire biventricular system. He is now healed completely. He denies chest pain but has class 2-3 heart failure symptoms. No syncope. No fevers or chills. Allergies  Allergen Reactions  . Atorvastatin Other (See Comments)    Joint pain.     Current Outpatient Prescriptions  Medication Sig Dispense Refill  . amiodarone (PACERONE) 200 MG tablet Take 300 mg by mouth daily.      . aspirin EC 81 MG tablet Take 81 mg by mouth daily.      . carvedilol (COREG) 12.5 MG tablet Take 12.5 mg by mouth daily.       . enalapril (VASOTEC) 5 MG tablet Take 2.5-5 mg by mouth 2 (two) times daily. Take 1 tablet every morning and take  tablet every evening.      . furosemide (LASIX) 40 MG tablet Take 60 mg by mouth daily.      . LORazepam (ATIVAN) 0.5 MG tablet Take 0.25 mg by mouth every 8 (eight) hours.       . magnesium oxide (MAG-OX) 400 MG tablet Take 400 mg by mouth 2 (two) times daily.      . nitroGLYCERIN (NITROSTAT) 0.4 MG SL tablet Place 0.4 mg under the tongue every 5 (five) minutes as needed.      . potassium chloride SA (K-DUR,KLOR-CON) 20 MEQ tablet Take 20 mEq by mouth daily.      . rosuvastatin (CRESTOR) 10 MG tablet Take 10 mg by mouth daily.      . spironolactone (ALDACTONE) 25 MG tablet Take 25 mg by mouth daily.      . vitamin B-12 (CYANOCOBALAMIN) 1000 MCG tablet Take 1,000 mcg by mouth daily.         Past Medical History  Diagnosis Date  . Chest pain   . Hypertension   . Hyperlipidemia   . 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 H/O atrial flutter     ablation in sinus rhythm   . GERD (gastroesophageal reflux disease)    with gas and bloating probably cause of chest pain  . Ischemic cardiomyopathy     H/O with EF at this cath of 45%  . H/O: gout   . ICD (implantable cardiac defibrillator) infection, s/p removal of ICD 07/12/2012    ROS:   All systems reviewed and negative except as noted in the HPI.   Past Surgical History  Procedure Date  . Coronary artery bypass graft 1993    redo in 2000, he has had multiple MI previous to both procedures.  . Cardiac catheterization Dec 18, 2003    with a presentation of atrial flutter with rapid ventricular response. He had a cath and two stents to his PDA after his SVG to his RCA. His other graft were patent. He had LV with EF of 30%.   . Carotid endarterectomy     left in December 2003 and know renal artery stenosis of 60-70%  . Valve replacement 2010     No family history on file.   History   Social History  . Marital Status: Married    Spouse Name: N/A    Number of Children: N/A  .   Years of Education: N/A   Occupational History  . Not on file.   Social History Main Topics  . Smoking status: Never Smoker   . Smokeless tobacco: Not on file  . Alcohol Use: Not on file  . Drug Use: Not on file  . Sexually Active: Not on file   Other Topics Concern  . Not on file   Social History Narrative  . No narrative on file     BP 132/80  Pulse 64  Ht 6' 1" (1.854 m)  Wt 192 lb (87.091 kg)  BMI 25.33 kg/m2  Physical Exam:  Well appearing 71-year-old man, NAD HEENT: Unremarkable Neck:  No JVD, no thyromegally Lungs:  Clear with no wheezes, rales, or rhonchi. Explanted ICD pocket healing nicely with a small area of hematoma still present no drainage HEART:  Regular rate rhythm, no murmurs, no rubs, no clicks S2 is split Abd:  soft, positive bowel sounds, no organomegally, no rebound, no guarding Ext:  2 plus pulses, no edema, no cyanosis, no clubbing Skin:  No rashes no nodules Neuro:  CN II through XII intact, motor grossly intact  EKG   Normal sinus rhythm with left bundle branch block  Assess/Plan:  

## 2012-08-18 NOTE — Op Note (Signed)
BiV ICD implant via the right subclavian vein without immediate complication. U#[id number].

## 2012-08-18 NOTE — Discharge Summary (Signed)
ELECTROPHYSIOLOGY DISCHARGE SUMMARY    Patient ID: [name],  MRN: [id number], DOB/AGE: 1941-02-28 71 y.o.  Admit date: 08/18/2012 Discharge date: 08/19/2012  Primary Care Physician: Vivien Rossetti, MD Primary Cardiologist: Susa Griffins, MD Primary EP: Lewayne Bunting, MD  Primary Discharge Diagnosis:  1. Ischemic CM with chronic systolic HF s/p reimplantation of BiV ICD system 08/18/2012  Secondary Discharge Diagnoses:  1. Recent occult pocket infection s/p BiV ICD system extraction 07/08/2012 2. CAD s/p CABG 1993, redo CABG 2000, LHC in 2010 shows patent grafts  3. AS s/p AVR May 2010  4. Ischemic cardiomyopathy, EF of 30% s/p BiV ICD implantation 2008 (Medtronic); echo done August 2010, EF 20-25%  5. Chronic renal insufficiency  6. Hypertension  7. Hyperlipidemia  8. PVD (peripheral vascular disease) 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, s/p left CEA  9. History of atrial flutter ablation in sinus rhythm  10. GERD (gastroesophageal reflux disease)  11. Gout   Procedures This Admission:  1. Insertion of a biventricular ICD via the right subclavian vein with defibrillation threshold testing 08/18/2012 Medtronic model 6935, 58 cm active fixation defibrillation lead, serial #[id number] was advanced into the right ventricle and the Medtronic model 5076, 45 cm active fixation pacing lead, serial number [id number] was advanced to the right atrium. Medtronic model [id number] cm bipolar LV pacing lead, serial number [id number] was advanced into the lateral vein about 2/3rd from base to apex. Medtronic BiV ICD, serial number [id number].  History and Hospital Course:  Mr. Mallek is a pleasant 71 year old gentleman with an ischemic cardiomyopathy s/p BiV ICD implantation, CAD s/p CABG 1993, redo CABG 2000, AS s/p AVR 2010, PVD, HTN and chronic kidney disease. He was found to have an occult pocket infection at the time of his generator change several weeks ago. He ultimately underwent  extraction of his entire biventricular system. He was allowed to heal completely. He presented 08/18/2012 for reimplantation of a BiV ICD system for CRT. Mr. Canter tolerated this procedure well without any immediate complication. He remains hemodynamically stable and afebrile. His chest xray shows stable lead placement without pneumothorax. His device interrogation shows normal BiV ICD function with stable lead parameters/measurements. His implant site is intact without significant bleeding or hematoma. He has been given discharge instructions including wound care and activity restrictions. He will follow-up in 10 days for wound check. There were no changes made to his medications. He has been seen, examined and deemed stable for discharge today by Dr. Hillis Range.  Physical Exam: Vitals: Blood pressure 107/65, pulse 55, temperature 98.2 F (36.8 C), temperature source Oral, resp. rate 20, height 6\' 1"  (1.854 m), weight 191 lb (86.637 kg), SpO2 96.00%.  General: Well developed, well appearing 71 year old male in no acute distress. Heart: RRR. S1, S2 without murmur, rub or gallop. Lungs: CTA bilaterally. No wheezes, rales or rhonchi. Extremities: No cyanosis, clubbing or edema. Skin: Right upper chest/implant site intact without significant bleeding or hematoma.  Labs: Lab Results  Component Value Date   WBC 5.5 08/13/2012   HGB 12.6* 08/13/2012   HCT 36.0* 08/13/2012   MCV 90.5 08/13/2012   PLT 253 08/13/2012     Lab 08/13/12 1704  NA 124*  K 4.4  CL 87*  CO2 24  BUN 14  CREATININE 1.04  CALCIUM 9.6  PROT --  BILITOT --  ALKPHOS --  ALT --  AST --  GLUCOSE 57*    Disposition:  The patient is being discharged in stable condition.  Follow-up: Follow-up Information    Follow up with [facility] on 08/27/2012. (At 12:30 PM for wound check)    Contact information:   [address] [place] [address] [phone]      Follow up with  Lewayne Bunting, MD on 11/23/2012. (At 2:45 PM)    Contact information:   [address] [place] [address] [phone]        Discharge Medications:  Medication List  As of 08/19/2012 11:14 AM   TAKE these medications         amiodarone 200 MG tablet   Commonly known as: PACERONE   Take 300 mg by mouth daily.      aspirin EC 81 MG tablet   Take 81 mg by mouth daily.      carvedilol 12.5 MG tablet   Commonly known as: COREG   Take 12.5 mg by mouth 2 (two) times daily with a meal.      cephALEXin 500 MG capsule   Commonly known as: KEFLEX   Take 500 mg by mouth 3 (three) times daily.      enalapril 5 MG tablet   Commonly known as: VASOTEC   Take 5 mg by mouth 2 (two) times daily.      furosemide 40 MG tablet   Commonly known as: LASIX   Take 60 mg by mouth daily.      LORazepam 0.5 MG tablet   Commonly known as: ATIVAN   Take 0.25 mg by mouth 2 (two) times daily.      magnesium oxide 400 MG tablet   Commonly known as: MAG-OX   Take 400 mg by mouth 2 (two) times daily.      nitroGLYCERIN 0.4 MG SL tablet   Commonly known as: NITROSTAT   Place 0.4 mg under the tongue every 5 (five) minutes as needed. Chest pain      potassium chloride SA 20 MEQ tablet   Commonly known as: K-DUR,KLOR-CON   Take 20 mEq by mouth daily.      rosuvastatin 10 MG tablet   Commonly known as: CRESTOR   Take 10 mg by mouth daily.      spironolactone 25 MG tablet   Commonly known as: ALDACTONE   Take 25 mg by mouth daily.      vitamin B-12 1000 MCG tablet   Commonly known as: CYANOCOBALAMIN   Take 1,000 mcg by mouth daily.          Duration of Discharge Encounter: Greater than 30 minutes including physician time.  Signed, Rick Duff, PA-C 08/19/2012, 11:14 AM   I have seen, examined the patient, and reviewed the above assessment and plan.  Changes to above are made where necessary.  CXR and device interrogation are both reviewed and normal.   DC to home with outpatient follow-up.  Co Sign: Hillis Range, MD 08/19/2012 12:45 PM

## 2012-08-18 NOTE — Interval H&P Note (Signed)
History and Physical Interval Note:  08/18/2012 7:13 AM  [name]  has presented today for surgery, with the diagnosis of bradycardia  The various methods of treatment have been discussed with the patient and family. After consideration of risks, benefits and other options for treatment, the patient has consented to  Procedure(s) (LRB): IMPLANTABLE CARDIOVERTER DEFIBRILLATOR IMPLANT (N/A) as a surgical intervention .  The patient's history has been reviewed, patient examined, no change in status, stable for surgery.  I have reviewed the patient's chart and labs.  Questions were answered to the patient's satisfaction.     Lewayne Bunting

## 2012-08-19 ENCOUNTER — Ambulatory Visit (HOSPITAL_COMMUNITY): Payer: Medicare Other

## 2012-08-19 ENCOUNTER — Encounter (HOSPITAL_COMMUNITY): Payer: Self-pay

## 2012-08-19 DIAGNOSIS — I2589 Other forms of chronic ischemic heart disease: Secondary | ICD-10-CM

## 2012-08-19 NOTE — Op Note (Signed)
[name], [name] NO.:  [id number]  MEDICAL RECORD NO.:  [id number]  LOCATION:  [id number]                        FACILITY:  [facility]  PHYSICIAN:  Doylene Canning. [name], MD    DATE OF BIRTH:  1941/08/26  DATE OF PROCEDURE:  08/18/2012 DATE OF DISCHARGE:                              OPERATIVE REPORT   PROCEDURE PERFORMED:  Insertion of a biventricular ICD via the right subclavian vein with defibrillation threshold testing.  INTRODUCTION:  The patient is a 71 year old man with a longstanding ischemic cardiomyopathy, chronic left bundle-branch block and severe left ventricular dysfunction, ejection fraction of 25%.  He underwent ICD implantation from the left side several years ago.  He reached elective replacement and when he was set up to undergo generator change, was found to have clear purulent appearing fluid in his pocket.  He ultimately underwent a total ICD system extraction.  That was over a month ago.  He is now referred for insertion of a new device on the contralateral side.  PROCEDURE:  After informed consent was obtained, the patient was taken to the diagnostic EP lab in a fasting state.  After usual preparation and draping, intravenous fentanyl and midazolam was given for sedation. A 30 mL of lidocaine was infiltrated into the right infraclavicular region.  A 7 cm incision was carried out over this region. Electrocautery was utilized to dissect down to the fascial plane.  The right subclavian vein was then punctured x3.  The Medtronic model 6935, 58 cm active fixation defibrillation lead, serial #[id number] was advanced into the right ventricle and the Medtronic model 5076, 45 cm active fixation pacing lead, serial number [id number] was advanced to the right atrium.  Mapping was first carried out in the right ventricle. At the final site, the R-waves measured 10 mV, the pacing impedance was 685 ohms, and the threshold 0.6 V at 0.5 msec.  A 10 V pacing did  not stimulate the diaphragm and a large injury of current was present with active fixation of the lead.  With the ventricular lead in satisfactory position, attention was then turned to right atrial lead which was placed in anterolateral portion of the right atrium where P-waves measured 1.5 to 2 mV.  The pacing impedance was 800 ohms with the lead actively fixed and the threshold was 0.6 V at 0.5 msec.  Again, 10 V pacing not stimulate the diaphragm and there was a large injury current with active fixation of the lead.  With the atrial and the RV leads in satisfactory position, attention was then turned to placement of the left ventricular lead.  The coronary sinus guiding catheter and a 6- [place] hexapolar EP catheter were inserted through the right subclavian vein by way of a 9.5 French peel-away sheath.  The coronary sinus was then cannulated with a moderate amount of difficulty.  The right atrium was large.  Having accomplished this, venography of the coronary sinus was carried out.  This demonstrated a lateral vein suitable for LV pacing.  A 0.014 angioplasty guidewire was advanced by way of the guiding catheter into the lateral vein.  A Medtronic model [id number] cm bipolar LV pacing lead, serial number [id number] was advanced into  the lateral vein about 2/3rd from base to apex.  In this location, the pacing threshold was around a volt at 0.5 msec and the pacing impedance was 900 ohms.  A 10 V pacing did not stimulate the diaphragm.  With these satisfactory parameters, the guiding catheter was liberated from the LV pacing lead in the usual manner.  The leads were secured to the subpectoral fascia with a figure-of-eight silk suture.  The sewing sleeves were secured with silk suture.  Electrocautery was utilized to make a subcutaneous pocket.  Antibiotic irrigation was utilized to irrigate the pocket.  Electrocautery was utilized to assure hemostasis. The Medtronic BiV ICD, serial number  [id number] was connected to the atrial RV and LV leads and placed back in the subcutaneous pocket.  The pocket was irrigated with additional antibiotic irrigation and the incision was closed with 2-0 and 3-0 Vicryl.  Benzoin and Steri-Strips were painted on the skin, a pressure dressing was applied.  At this point, I scrubbed out of the case to supervise defibrillation threshold testing.  After the patient was more deeply sedated with fentanyl and Versed under my direct supervision, ventricular fibrillation was induced with the 50 Hz burst pacing noted.  A 20 joule shock was then delivered which terminated ventricular fibrillation and restored sinus rhythm.  At this point, no additional defibrillation threshold testing was carried out and a pressure dressing was applied and the patient was returned to his room in satisfactory condition.  COMPLICATIONS:  There were no immediate procedure complications.  RESULTS:  This demonstrates successful implantation of a Medtronic biventricular ICD from the right subclavian vein site without immediate procedure complication.     Doylene Canning. [name], MD     GWT/MEDQ  D:  08/18/2012  T:  08/19/2012  Job:  [id number]  cc:   Gerlene Burdock A. Alanda Amass, M.D.

## 2012-08-19 NOTE — Progress Notes (Signed)
Pt educated on stopping Keflex med.  He agreed and stated he would no longer take it.  Updated his d/c paperwork accordingly.

## 2012-08-27 ENCOUNTER — Encounter: Payer: Self-pay | Admitting: Internal Medicine

## 2012-08-27 ENCOUNTER — Ambulatory Visit (INDEPENDENT_AMBULATORY_CARE_PROVIDER_SITE_OTHER): Payer: Medicare Other | Admitting: Cardiology

## 2012-08-27 ENCOUNTER — Encounter: Payer: Self-pay | Admitting: Cardiology

## 2012-08-27 VITALS — BP 100/62 | HR 72 | Ht 73.0 in | Wt 195.6 lb

## 2012-08-27 DIAGNOSIS — I509 Heart failure, unspecified: Secondary | ICD-10-CM

## 2012-08-27 DIAGNOSIS — I5022 Chronic systolic (congestive) heart failure: Secondary | ICD-10-CM

## 2012-08-27 DIAGNOSIS — Z95810 Presence of automatic (implantable) cardiac defibrillator: Secondary | ICD-10-CM

## 2012-08-27 NOTE — Progress Notes (Signed)
Device check only. See PaceArt report. 

## 2012-09-08 LAB — ICD DEVICE OBSERVATION
AL IMPEDENCE ICD: 513 Ohm
AL THRESHOLD: 0.75 V
ATRIAL PACING ICD: 6.6 pct
HV IMPEDENCE: 73 Ohm
LV LEAD IMPEDENCE ICD: 855 Ohm
LV LEAD THRESHOLD: 0.75 V
RV LEAD IMPEDENCE ICD: 494 Ohm
RV LEAD THRESHOLD: 1.25 V

## 2012-11-19 ENCOUNTER — Encounter: Payer: Self-pay | Admitting: *Deleted

## 2012-11-23 ENCOUNTER — Encounter: Payer: Self-pay | Admitting: Internal Medicine

## 2012-11-23 ENCOUNTER — Ambulatory Visit (INDEPENDENT_AMBULATORY_CARE_PROVIDER_SITE_OTHER): Payer: Medicare Other | Admitting: Internal Medicine

## 2012-11-23 VITALS — BP 129/72 | HR 75 | Ht 73.0 in | Wt 194.8 lb

## 2012-11-23 DIAGNOSIS — I5042 Chronic combined systolic (congestive) and diastolic (congestive) heart failure: Secondary | ICD-10-CM

## 2012-11-23 DIAGNOSIS — I2589 Other forms of chronic ischemic heart disease: Secondary | ICD-10-CM

## 2012-11-23 DIAGNOSIS — T827XXA Infection and inflammatory reaction due to other cardiac and vascular devices, implants and grafts, initial encounter: Secondary | ICD-10-CM

## 2012-11-23 LAB — ICD DEVICE OBSERVATION
AL AMPLITUDE: 1.75 mv
AL IMPEDENCE ICD: 513 Ohm
AL THRESHOLD: 0.875 V
ATRIAL PACING ICD: 28.44 pct
BATTERY VOLTAGE: 3.1986 V
LV LEAD IMPEDENCE ICD: 817 Ohm
RV LEAD IMPEDENCE ICD: 532 Ohm
TOT-0002: 0
TOT-0006: 20130828000000
TZAT-0001ATACH: 2
TZAT-0001ATACH: 3
TZAT-0001FASTVT: 1
TZAT-0001SLOWVT: 1
TZAT-0002ATACH: NEGATIVE
TZAT-0002FASTVT: NEGATIVE
TZAT-0012ATACH: 150 ms
TZAT-0012ATACH: 150 ms
TZAT-0012ATACH: 150 ms
TZAT-0018ATACH: NEGATIVE
TZAT-0018SLOWVT: NEGATIVE
TZAT-0020ATACH: 1.5 ms
TZON-0003ATACH: 350 ms
TZON-0003VSLOWVT: 360 ms
TZON-0004SLOWVT: 16
TZON-0004VSLOWVT: 32
TZON-0005SLOWVT: 12
TZST-0001ATACH: 4
TZST-0001ATACH: 6
TZST-0001FASTVT: 3
TZST-0001FASTVT: 4
TZST-0001SLOWVT: 2
TZST-0001SLOWVT: 3
TZST-0001SLOWVT: 4
TZST-0001SLOWVT: 6
TZST-0002ATACH: NEGATIVE
TZST-0002FASTVT: NEGATIVE
TZST-0002FASTVT: NEGATIVE
TZST-0002SLOWVT: NEGATIVE
TZST-0002SLOWVT: NEGATIVE
VENTRICULAR PACING ICD: 99.98 pct

## 2012-11-23 MED ORDER — AMIODARONE HCL 200 MG PO TABS: 200.0000 mg | ORAL_TABLET | Freq: Every day | ORAL | Status: DC

## 2012-11-23 MED ORDER — FUROSEMIDE 40 MG PO TABS: ORAL_TABLET | ORAL | Status: DC

## 2012-11-23 NOTE — Progress Notes (Signed)
HPI Mr. Persichetti returns today for followup. He is a 71 year old man with an ischemic cardiomyopathy, chronic systolic heart failure, left bundle branch block, status post biventricular ICD implantation. In the last month, he has had worsening shortness of breath. He denies peripheral edema or dietary indiscretion. He does admit to drinking beer question in excess. He denies syncope or any ICD shock.  Allergies  Allergen Reactions  . Atorvastatin Other (See Comments)    Joint pain.     Current Outpatient Prescriptions  Medication Sig Dispense Refill  . amiodarone (PACERONE) 200 MG tablet Take 1 tablet (200 mg total) by mouth daily.      [name] aspirin EC 81 MG tablet Take 162 mg by mouth daily.       . carvedilol (COREG) 12.5 MG tablet Take 12.5 mg by mouth 2 (two) times daily with a meal.       . enalapril (VASOTEC) 5 MG tablet Take 5 mg by mouth 2 (two) times daily.       . furosemide (LASIX) 40 MG tablet Take 2 by mouth daily as directed  80 tablet  3  . LORazepam (ATIVAN) 0.5 MG tablet Take 0.25 mg by mouth 2 (two) times daily.       . magnesium oxide (MAG-OX) 400 MG tablet Take 400 mg by mouth 2 (two) times daily.      . nitroGLYCERIN (NITROSTAT) 0.4 MG SL tablet Place 0.4 mg under the tongue every 5 (five) minutes as needed. Chest pain      . potassium chloride SA (K-DUR,KLOR-CON) 20 MEQ tablet Take 20 mEq by mouth daily.      . rosuvastatin (CRESTOR) 10 MG tablet Take 10 mg by mouth daily.      [name] spironolactone (ALDACTONE) 25 MG tablet Take 25 mg by mouth daily.      . vitamin B-12 (CYANOCOBALAMIN) 1000 MCG tablet Take 1,000 mcg by mouth daily.         Past Medical History  Diagnosis Date  . Chest pain   . Hypertension   . Hyperlipidemia   . 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 H/O atrial flutter     ablation in sinus rhythm   . GERD (gastroesophageal reflux disease)     with gas and bloating probably cause of chest pain  . Ischemic  cardiomyopathy     H/O with EF at this cath of 45%  . H/O: gout   . 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 Myocardial infarction   . Anginal pain   . Dysrhythmia   . Anxiety   . Shortness of breath   . Pacemaker   . CHF (congestive heart failure)   . Arthritis     gout    ROS:   All systems reviewed and negative except as noted in the HPI.   Past Surgical History  Procedure Date  . Coronary artery bypass graft 1993    redo in 2000, he has had multiple MI previous to both procedures.  . Cardiac catheterization Dec 18, 2003    with a presentation of atrial flutter with rapid ventricular response. He had a cath and two stents to his PDA after his SVG to his RCA. His other graft were patent. He had LV with EF of 30%.   . Carotid endarterectomy     left in December 2003 and know renal artery stenosis of 60-70%  . Valve replacement 2010  No family history on file.   History   Social History  . Marital Status: Married    Spouse Name: N/A    Number of Children: N/A  . Years of Education: N/A   Occupational History  . Not on file.   Social History Main Topics  . Smoking status: Never Smoker   . Smokeless tobacco: Current User    Types: Chew  . Alcohol Use: 8.4 oz/week    14 Cans of beer per week  . Drug Use: No  . Sexually Active: Not on file   Other Topics Concern  . Not on file   Social History Narrative  . No narrative on file     BP 129/72  Pulse 75  Ht 6\' 1"  (1.854 m)  Wt 194 lb 12.8 oz (88.361 kg)  BMI 25.70 kg/m2  Physical Exam:  Well appearing 71 year old man, NAD HEENT: Unremarkable Neck:  9 cm JVD, no thyromegally Lungs:  Clear with basilar rales. HEART:  Regular rate rhythm, no murmurs, no rubs, no clicks Abd:  soft, positive bowel sounds, no organomegally, no rebound, no guarding Ext:  2 plus pulses, no edema, no cyanosis, no clubbing Skin:  No rashes no nodules Neuro:  CN II through XII  intact, motor grossly intact  EKG sinus rhythm with biventricular pacing  DEVICE  Normal device function.  See PaceArt for details.   Assess/Plan:

## 2012-11-23 NOTE — Assessment & Plan Note (Signed)
He has evidence of volume overload on exam. I've recommended that the patient increase his Lasix to 80 mg twice a day for 4 days and then 80 mg daily thereafter. He is on an ACE inhibitor as well as spiral lactone and for this reason, I will not initially increase his potassium. We will have him recheck his potassium level when he comes back to the office in approximately one to 2 weeks. He is instructed to maintain a low-sodium diet and to not drink more than 2 alcoholic beverages per day.

## 2012-11-23 NOTE — Assessment & Plan Note (Signed)
His Medtronic biventricular ICD is working normally. His fluid index is elevated. He is pacing in the ventricle 99% of the time.

## 2012-11-23 NOTE — Patient Instructions (Signed)
Your physician recommends that you schedule a follow-up appointment in: 5 weeks with Dr [name]   Your physician has recommended you make the following change in your medication:  1) decrease Amiodarone to 200mg  daily 2) Take Furosemide 40mg  ---take 2 tablets twice daily for 4 days, then decrease to 2 tablets daily  Your physician recommends that you return for lab work in: 2 weeks BMP

## 2012-12-07 ENCOUNTER — Other Ambulatory Visit (INDEPENDENT_AMBULATORY_CARE_PROVIDER_SITE_OTHER): Payer: Medicare Other

## 2012-12-07 DIAGNOSIS — I255 Ischemic cardiomyopathy: Secondary | ICD-10-CM

## 2012-12-07 DIAGNOSIS — I2589 Other forms of chronic ischemic heart disease: Secondary | ICD-10-CM

## 2012-12-07 LAB — CBC WITH DIFFERENTIAL/PLATELET
Basophils Absolute: 0 10*3/uL (ref 0.0–0.1)
Basophils Relative: 0.3 % (ref 0.0–3.0)
Eosinophils Absolute: 0.1 10*3/uL (ref 0.0–0.7)
Eosinophils Relative: 2.7 % (ref 0.0–5.0)
HCT: 35 % — ABNORMAL LOW (ref 39.0–52.0)
Hemoglobin: 11.8 g/dL — ABNORMAL LOW (ref 13.0–17.0)
Lymphocytes Relative: 10.7 % — ABNORMAL LOW (ref 12.0–46.0)
Lymphs Abs: 0.5 10*3/uL — ABNORMAL LOW (ref 0.7–4.0)
MCHC: 33.7 g/dL (ref 30.0–36.0)
MCV: 98.3 fl (ref 78.0–100.0)
Monocytes Absolute: 0.8 10*3/uL (ref 0.1–1.0)
Monocytes Relative: 15.2 % — ABNORMAL HIGH (ref 3.0–12.0)
Neutro Abs: 3.5 10*3/uL (ref 1.4–7.7)
Neutrophils Relative %: 71.1 % (ref 43.0–77.0)
Platelets: 194 10*3/uL (ref 150.0–400.0)
RBC: 3.56 Mil/uL — ABNORMAL LOW (ref 4.22–5.81)
RDW: 15.8 % — ABNORMAL HIGH (ref 11.5–14.6)
WBC: 5 10*3/uL (ref 4.5–10.5)

## 2012-12-07 LAB — BASIC METABOLIC PANEL WITH GFR
BUN: 17 mg/dL (ref 6–23)
CO2: 26 meq/L (ref 19–32)
Calcium: 9.6 mg/dL (ref 8.4–10.5)
Chloride: 91 meq/L — ABNORMAL LOW (ref 96–112)
Creatinine, Ser: 1.4 mg/dL (ref 0.4–1.5)
GFR: 54.61 mL/min — ABNORMAL LOW
Glucose, Bld: 107 mg/dL — ABNORMAL HIGH (ref 70–99)
Potassium: 4.2 meq/L (ref 3.5–5.1)
Sodium: 128 meq/L — ABNORMAL LOW (ref 135–145)

## 2012-12-30 ENCOUNTER — Encounter: Payer: Self-pay | Admitting: Internal Medicine

## 2012-12-30 ENCOUNTER — Ambulatory Visit (INDEPENDENT_AMBULATORY_CARE_PROVIDER_SITE_OTHER): Payer: Medicare Other | Admitting: Internal Medicine

## 2012-12-30 VITALS — BP 138/89 | HR 82 | Ht 73.0 in | Wt 190.0 lb

## 2012-12-30 DIAGNOSIS — I2589 Other forms of chronic ischemic heart disease: Secondary | ICD-10-CM

## 2012-12-30 DIAGNOSIS — T827XXA Infection and inflammatory reaction due to other cardiac and vascular devices, implants and grafts, initial encounter: Secondary | ICD-10-CM

## 2012-12-30 DIAGNOSIS — I5042 Chronic combined systolic (congestive) and diastolic (congestive) heart failure: Secondary | ICD-10-CM

## 2012-12-30 MED ORDER — FUROSEMIDE 40 MG PO TABS: ORAL_TABLET | ORAL | Status: DC

## 2012-12-30 NOTE — Assessment & Plan Note (Signed)
His chronic systolic heart failure remains difficult to control. Today I've asked the patient to increase his Lasix to 80 mg twice a day for 2 days and then increase his regular daily therapy to 80 mg in the morning and an additional 40 mg on Monday Wednesday and Friday. In addition, I've encouraged the patient to reduce his alcohol consumption. He is instructed to drink no more than 2 alcoholic beverages a day.

## 2012-12-30 NOTE — Patient Instructions (Signed)
Your physician wants you to follow-up in: 3 months with device clinic and 6 months with Dr [name].  You will receive a reminder letter in the mail two months in advance. If you don't receive a letter, please call our office to schedule the follow-up appointment.  Your physician has recommended you make the following change in your medication:   1.  Take Lasix 40mg  2 tablets twice daily for the next 2 days.  After that, take 2 tablets in the morning and 1 tablet in the afternoon on Mondays, Wednesdays, and Fridays.  Take 2 tablets once daily all other days.

## 2012-12-30 NOTE — Assessment & Plan Note (Signed)
His device is working normally. His Medtronic ventricular ICD has satisfactory pacing thresholds in the right atrium, right ventricle, and left ventricle.

## 2012-12-30 NOTE — Progress Notes (Signed)
HPI [name]  returns today for followup. He is a very pleasant 72 year old man with an ischemic cardiomyopathy, chronic systolic heart failure, status post biventricular ICD implantation. When I saw him last several weeks ago, his fluid status was elevated. I have him increase his diuretic therapy for 4 days. He improved. Over the last couple of weeks, he has noted increasing shortness of breath. His fluid index is elevated again today. He has tried to improve his dietary compliance. Allergies  Allergen Reactions  . Atorvastatin Other (See Comments)    Joint pain.     Current Outpatient Prescriptions  Medication Sig Dispense Refill  . amiodarone (PACERONE) 200 MG tablet Take 1 tablet (200 mg total) by mouth daily.      [name] aspirin EC 81 MG tablet Take 162 mg by mouth daily.       . carvedilol (COREG) 12.5 MG tablet Take 12.5 mg by mouth 2 (two) times daily with a meal.       . enalapril (VASOTEC) 5 MG tablet Take 5 mg by mouth 2 (two) times daily.       . furosemide (LASIX) 40 MG tablet Take 2 by mouth daily      . LORazepam (ATIVAN) 0.5 MG tablet Take 0.25 mg by mouth as needed.       . magnesium oxide (MAG-OX) 400 MG tablet Take 400 mg by mouth 2 (two) times daily.      . nitroGLYCERIN (NITROSTAT) 0.4 MG SL tablet Place 0.4 mg under the tongue every 5 (five) minutes as needed. Chest pain      . potassium chloride SA (K-DUR,KLOR-CON) 20 MEQ tablet Take 20 mEq by mouth daily.      . rosuvastatin (CRESTOR) 10 MG tablet Take 10 mg by mouth daily.      [name] spironolactone (ALDACTONE) 25 MG tablet Take 25 mg by mouth daily.      . vitamin B-12 (CYANOCOBALAMIN) 1000 MCG tablet Take 1,000 mcg by mouth daily.         Past Medical History  Diagnosis Date  . Chest pain   . Hypertension   . Hyperlipidemia   . 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 H/O atrial flutter     ablation in sinus rhythm   . GERD (gastroesophageal reflux disease)     with gas and  bloating probably cause of chest pain  . Ischemic cardiomyopathy     H/O with EF at this cath of 45%  . H/O: gout   . 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 Myocardial infarction   . Anginal pain   . Dysrhythmia   . Anxiety   . Shortness of breath   . Pacemaker   . CHF (congestive heart failure)   . Arthritis     gout    ROS:   All systems reviewed and negative except as noted in the HPI.   Past Surgical History  Procedure Date  . Coronary artery bypass graft 1993    redo in 2000, he has had multiple MI previous to both procedures.  . Cardiac catheterization Dec 18, 2003    with a presentation of atrial flutter with rapid ventricular response. He had a cath and two stents to his PDA after his SVG to his RCA. His other graft were patent. He had LV with EF of 30%.   . Carotid endarterectomy     left in December 2003  and know renal artery stenosis of 60-70%  . Valve replacement 2010     No family history on file.   History   Social History  . Marital Status: Married    Spouse Name: N/A    Number of Children: N/A  . Years of Education: N/A   Occupational History  . Not on file.   Social History Main Topics  . Smoking status: Never Smoker   . Smokeless tobacco: Current User    Types: Chew  . Alcohol Use: 8.4 oz/week    14 Cans of beer per week  . Drug Use: No  . Sexually Active: Not on file   Other Topics Concern  . Not on file   Social History Narrative  . No narrative on file     BP 138/89  Pulse 82  Ht 6\' 1"  (1.854 m)  Wt 190 lb (86.183 kg)  BMI 25.07 kg/m2  Physical Exam:  Well appearing 72 year old man, NAD HEENT: Unremarkable Neck:  7 cm JVD, no thyromegally Lungs:  Clear except for rales in the bases bilaterally. No increased work of breathing. HEART:  Regular rate rhythm, no murmurs, no rubs, no clicks Abd:  soft, positive bowel sounds, no organomegally, no rebound, no guarding Ext:  2 plus  pulses, no edema, no cyanosis, no clubbing Skin:  No rashes no nodules Neuro:  CN II through XII intact, motor grossly intact  DEVICE  Normal device function.  See PaceArt for details. Fluid index is elevated.  Assess/Plan:

## 2013-01-03 LAB — ICD DEVICE OBSERVATION
AL AMPLITUDE: 1.9 mv
AL IMPEDENCE ICD: 532 Ohm
AL THRESHOLD: 0.875 V
BAMS-0001: 170 {beats}/min
LV LEAD IMPEDENCE ICD: 855 Ohm
RV LEAD AMPLITUDE: 17 mv
TZAT-0001FASTVT: 1
TZAT-0001SLOWVT: 1
TZAT-0002ATACH: NEGATIVE
TZAT-0002ATACH: NEGATIVE
TZAT-0002SLOWVT: NEGATIVE
TZAT-0012ATACH: 150 ms
TZAT-0012ATACH: 150 ms
TZAT-0012FASTVT: 200 ms
TZAT-0012SLOWVT: 200 ms
TZAT-0018FASTVT: NEGATIVE
TZAT-0019ATACH: 6 V
TZAT-0019ATACH: 6 V
TZAT-0019ATACH: 6 V
TZAT-0020ATACH: 1.5 ms
TZAT-0020ATACH: 1.5 ms
TZON-0003ATACH: 350 ms
TZON-0003SLOWVT: 360 ms
TZST-0001ATACH: 4
TZST-0001ATACH: 5
TZST-0001FASTVT: 2
TZST-0001FASTVT: 4
TZST-0001FASTVT: 5
TZST-0001FASTVT: 6
TZST-0001SLOWVT: 3
TZST-0001SLOWVT: 4
TZST-0001SLOWVT: 5
TZST-0002ATACH: NEGATIVE
TZST-0002FASTVT: NEGATIVE
TZST-0002FASTVT: NEGATIVE
TZST-0002SLOWVT: NEGATIVE
TZST-0002SLOWVT: NEGATIVE
TZST-0002SLOWVT: NEGATIVE
VENTRICULAR PACING ICD: 100 pct

## 2013-03-02 ENCOUNTER — Encounter (HOSPITAL_COMMUNITY): Payer: Self-pay | Admitting: *Deleted

## 2013-03-02 ENCOUNTER — Emergency Department (HOSPITAL_COMMUNITY): Payer: Medicare Other

## 2013-03-02 ENCOUNTER — Emergency Department (HOSPITAL_COMMUNITY)
Admission: EM | Admit: 2013-03-02 | Discharge: 2013-03-02 | Disposition: A | Discharge status: Home / Self Care | Payer: Medicare Other | Attending: Emergency Medicine | Admitting: Emergency Medicine

## 2013-03-02 DIAGNOSIS — Y9301 Activity, walking, marching and hiking: Secondary | ICD-10-CM | POA: Insufficient documentation

## 2013-03-02 DIAGNOSIS — I509 Heart failure, unspecified: Secondary | ICD-10-CM | POA: Insufficient documentation

## 2013-03-02 DIAGNOSIS — M109 Gout, unspecified: Secondary | ICD-10-CM | POA: Insufficient documentation

## 2013-03-02 DIAGNOSIS — Z79899 Other long term (current) drug therapy: Secondary | ICD-10-CM | POA: Insufficient documentation

## 2013-03-02 DIAGNOSIS — I2589 Other forms of chronic ischemic heart disease: Secondary | ICD-10-CM | POA: Insufficient documentation

## 2013-03-02 DIAGNOSIS — F411 Generalized anxiety disorder: Secondary | ICD-10-CM | POA: Insufficient documentation

## 2013-03-02 DIAGNOSIS — Z8709 Personal history of other diseases of the respiratory system: Secondary | ICD-10-CM | POA: Insufficient documentation

## 2013-03-02 DIAGNOSIS — Z8719 Personal history of other diseases of the digestive system: Secondary | ICD-10-CM | POA: Insufficient documentation

## 2013-03-02 DIAGNOSIS — Z954 Presence of other heart-valve replacement: Secondary | ICD-10-CM | POA: Insufficient documentation

## 2013-03-02 DIAGNOSIS — Z8679 Personal history of other diseases of the circulatory system: Secondary | ICD-10-CM | POA: Insufficient documentation

## 2013-03-02 DIAGNOSIS — I739 Peripheral vascular disease, unspecified: Secondary | ICD-10-CM | POA: Insufficient documentation

## 2013-03-02 DIAGNOSIS — E785 Hyperlipidemia, unspecified: Secondary | ICD-10-CM | POA: Insufficient documentation

## 2013-03-02 DIAGNOSIS — I10 Essential (primary) hypertension: Secondary | ICD-10-CM | POA: Insufficient documentation

## 2013-03-02 DIAGNOSIS — R55 Syncope and collapse: Secondary | ICD-10-CM | POA: Insufficient documentation

## 2013-03-02 DIAGNOSIS — Z95810 Presence of automatic (implantable) cardiac defibrillator: Secondary | ICD-10-CM | POA: Insufficient documentation

## 2013-03-02 DIAGNOSIS — I951 Orthostatic hypotension: Secondary | ICD-10-CM | POA: Insufficient documentation

## 2013-03-02 DIAGNOSIS — IMO0002 Reserved for concepts with insufficient information to code with codable children: Secondary | ICD-10-CM

## 2013-03-02 DIAGNOSIS — Z7982 Long term (current) use of aspirin: Secondary | ICD-10-CM | POA: Insufficient documentation

## 2013-03-02 DIAGNOSIS — I252 Old myocardial infarction: Secondary | ICD-10-CM | POA: Insufficient documentation

## 2013-03-02 DIAGNOSIS — Z951 Presence of aortocoronary bypass graft: Secondary | ICD-10-CM | POA: Insufficient documentation

## 2013-03-02 DIAGNOSIS — S058X9A Other injuries of unspecified eye and orbit, initial encounter: Secondary | ICD-10-CM | POA: Insufficient documentation

## 2013-03-02 DIAGNOSIS — M129 Arthropathy, unspecified: Secondary | ICD-10-CM | POA: Insufficient documentation

## 2013-03-02 DIAGNOSIS — Y9229 Other specified public building as the place of occurrence of the external cause: Secondary | ICD-10-CM | POA: Insufficient documentation

## 2013-03-02 DIAGNOSIS — Z9889 Other specified postprocedural states: Secondary | ICD-10-CM | POA: Insufficient documentation

## 2013-03-02 DIAGNOSIS — R296 Repeated falls: Secondary | ICD-10-CM | POA: Insufficient documentation

## 2013-03-02 LAB — POCT I-STAT TROPONIN I: Troponin i, poc: 0 ng/mL (ref 0.00–0.08)

## 2013-03-02 LAB — CBC WITH DIFFERENTIAL/PLATELET
Basophils Relative: 1 % (ref 0–1)
Eosinophils Absolute: 0.4 10*3/uL (ref 0.0–0.7)
HCT: 31.3 % — ABNORMAL LOW (ref 39.0–52.0)
Hemoglobin: 11.1 g/dL — ABNORMAL LOW (ref 13.0–17.0)
Lymphs Abs: 0.5 10*3/uL — ABNORMAL LOW (ref 0.7–4.0)
MCH: 33.2 pg (ref 26.0–34.0)
MCHC: 35.5 g/dL (ref 30.0–36.0)
Monocytes Absolute: 0.7 10*3/uL (ref 0.1–1.0)
Monocytes Relative: 14 % — ABNORMAL HIGH (ref 3–12)
Neutro Abs: 3.4 10*3/uL (ref 1.7–7.7)
RBC: 3.34 MIL/uL — ABNORMAL LOW (ref 4.22–5.81)

## 2013-03-02 LAB — URINALYSIS, ROUTINE W REFLEX MICROSCOPIC
Bilirubin Urine: NEGATIVE
Hgb urine dipstick: NEGATIVE
Ketones, ur: NEGATIVE mg/dL
Nitrite: NEGATIVE
Specific Gravity, Urine: 1.007 (ref 1.005–1.030)
pH: 6.5 (ref 5.0–8.0)

## 2013-03-02 LAB — BASIC METABOLIC PANEL
BUN: 10 mg/dL (ref 6–23)
Chloride: 91 mEq/L — ABNORMAL LOW (ref 96–112)
Creatinine, Ser: 1.14 mg/dL (ref 0.50–1.35)
GFR calc Af Amer: 73 mL/min — ABNORMAL LOW (ref >90)
Glucose, Bld: 77 mg/dL (ref 70–99)

## 2013-03-02 MED ORDER — LIDOCAINE-EPINEPHRINE (PF) 2 %-1:200000 IJ SOLN
20.0000 mL | Freq: Once | INTRAMUSCULAR | Status: DC
  Filled 2013-03-02: qty 20

## 2013-03-02 NOTE — ED Notes (Signed)
Pt undressed, in gown, on monitor, continuous pulse oximetry and blood pressure cuff; family at bedside 

## 2013-03-02 NOTE — ED Notes (Signed)
Pt given warm blanket and pt's wife a cup of ice

## 2013-03-02 NOTE — ED Notes (Addendum)
Pt started Lasix 40mg  on 12/30/12. 2 tabs in the AM 1 tab in evening. Per cardiology. Pt has medtronic ventricular ICD

## 2013-03-02 NOTE — ED Notes (Signed)
Pt has returned from being out of the department; pt placed back on monitor, continuous pulse oximetry and blood pressure cuff; family at bedside 

## 2013-03-02 NOTE — ED Notes (Signed)
To ED via [facility]-- from [employer]-- got dizzy when standing up quickly, lost balance, denies loss of consciousness-- has laceration over right eye.

## 2013-03-02 NOTE — ED Notes (Signed)
Pt still out of the department at this time 

## 2013-03-02 NOTE — ED Notes (Signed)
EDP at bedside  

## 2013-03-02 NOTE — ED Provider Notes (Signed)
History     CSN: [id number]  Arrival date & time 03/02/13  1346   First MD Initiated Contact with Patient 03/02/13 1457      Chief Complaint  Patient presents with  . Fall    (Consider location/radiation/quality/duration/timing/severity/associated sxs/prior treatment) HPI Comments: Patient comes to the ER for evaluation after a syncopal episode resulting in a fall. Patient reports that he has a history of dizziness when he stands up too quickly. It happens almost all the time, usually has to get up very slowly and wait a moment before he tries to walk. Sometimes the symptoms are more severe than others. Today he got out of the car and was walking into a restaurant when he suddenly got dizzy and fell forward. It's not clear if he has consciousness and, if he did it was only very briefly. Patient complaining of pain around the right side of his eyes from the fall.  Patient denies chest pain, shortness of breath, or palpitations prior to onset of the symptoms. Patient reports that his blood pressure has been up and down recently. He checks it daily, often times it is very low.  Patient is a 72 y.o. male presenting with fall.  Fall    Past Medical History  Diagnosis Date  . Chest pain   . Hypertension   . Hyperlipidemia   . 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 H/O atrial flutter     ablation in sinus rhythm   . GERD (gastroesophageal reflux disease)     with gas and bloating probably cause of chest pain  . Ischemic cardiomyopathy     H/O with EF at this cath of 45%  . H/O: gout   . 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 Myocardial infarction   . Anginal pain   . Dysrhythmia   . Anxiety   . Shortness of breath   . Pacemaker   . CHF (congestive heart failure)   . Arthritis     gout    Past Surgical History  Procedure Laterality Date  . Coronary artery bypass graft  1993    redo in 2000, he  has had multiple MI previous to both procedures.  . Cardiac catheterization  Dec 18, 2003    with a presentation of atrial flutter with rapid ventricular response. He had a cath and two stents to his PDA after his SVG to his RCA. His other graft were patent. He had LV with EF of 30%.   . Carotid endarterectomy      left in December 2003 and know renal artery stenosis of 60-70%  . Valve replacement  2010    History reviewed. No pertinent family history.  History  Substance Use Topics  . Smoking status: Never Smoker   . Smokeless tobacco: Current User    Types: Chew  . Alcohol Use: 8.4 oz/week    14 Cans of beer per week      Review of Systems  Respiratory: Negative for shortness of breath.   Cardiovascular: Negative for chest pain.  Skin: Positive for wound.  Neurological: Positive for syncope. Negative for seizures.  All other systems reviewed and are negative.    Allergies  Atorvastatin  Home Medications   Current Outpatient Rx  Name  Route  Sig  Dispense  Refill  . amiodarone (PACERONE) 200 MG tablet   Oral   Take 1 tablet (200 mg total) by mouth  daily.         . aspirin EC 81 MG tablet   Oral   Take by mouth daily.          . carvedilol (COREG) 12.5 MG tablet   Oral   Take 12.5 mg by mouth 2 (two) times daily with a meal.          . Cyanocobalamin (VITAMIN B-12 PO)   Oral   Take 1 tablet by mouth daily.         . enalapril (VASOTEC) 10 MG tablet   Oral   Take 10 mg by mouth daily.         . furosemide (LASIX) 40 MG tablet   Oral   Take 40 mg by mouth daily.         [name] LORazepam (ATIVAN) 0.5 MG tablet   Oral   Take 0.5 mg by mouth daily.          . magnesium oxide (MAG-OX) 400 MG tablet   Oral   Take 400 mg by mouth 2 (two) times daily.         . megestrol (MEGACE) 40 MG/ML suspension   Oral   Take 600 mg by mouth daily.         . potassium chloride SA (K-DUR,KLOR-CON) 20 MEQ tablet   Oral   Take 20 mEq by mouth daily.           [name] spironolactone (ALDACTONE) 25 MG tablet   Oral   Take 25 mg by mouth daily.         . nitroGLYCERIN (NITROSTAT) 0.4 MG SL tablet   Sublingual   Place 0.4 mg under the tongue every 5 (five) minutes as needed. Chest pain           BP 116/69  Pulse 62  Temp(Src) 98.6 F (37 C)  Resp 18  SpO2 100%  Physical Exam  Constitutional: He is oriented to person, place, and time. He appears well-developed and well-nourished. No distress.  HENT:  Head: Normocephalic and atraumatic.    Right Ear: Hearing normal.  Nose: Nose normal.  Mouth/Throat: Oropharynx is clear and moist and mucous membranes are normal.  Contusion lateral to the right orbital area. 1.75 cm contused laceration within the region.  No step offs or crepitance over the orbital rim.  Eyes: Conjunctivae and EOM are normal. Pupils are equal, round, and reactive to light. Right eye exhibits normal extraocular motion. Left eye exhibits normal extraocular motion.  No vision change, no pain with movement of the eyes. Normal extraocular muscle movement. No sign of entrapment.  Neck: Normal range of motion. Neck supple.  Cardiovascular: Normal rate, regular rhythm, S1 normal and S2 normal.  Exam reveals no gallop and no friction rub.   No murmur heard. Pulmonary/Chest: Effort normal and breath sounds normal. No respiratory distress. He exhibits no tenderness.  Abdominal: Soft. Normal appearance and bowel sounds are normal. There is no hepatosplenomegaly. There is no tenderness. There is no rebound, no guarding, no tenderness at McBurney's point and negative Murphy's sign. No hernia.  Musculoskeletal: Normal range of motion.  Neurological: He is alert and oriented to person, place, and time. He has normal strength. No cranial nerve deficit or sensory deficit. Coordination normal. GCS eye subscore is 4. GCS verbal subscore is 5. GCS motor subscore is 6.  Skin: Skin is warm, dry and intact. No rash noted. No cyanosis.   Psychiatric: He has a normal mood and affect. His speech  is normal and behavior is normal. Thought content normal.    ED Course  Procedures (including critical care time)  EKG: AV dual paced rhythm. No change from previous.  Labs Reviewed  CBC WITH DIFFERENTIAL - Abnormal; Notable for the following:    RBC 3.34 (*)    Hemoglobin 11.1 (*)    HCT 31.3 (*)    Lymphocytes Relative 10 (*)    Lymphs Abs 0.5 (*)    Monocytes Relative 14 (*)    Eosinophils Relative 8 (*)    All other components within normal limits  BASIC METABOLIC PANEL - Abnormal; Notable for the following:    Sodium 128 (*)    Chloride 91 (*)    GFR calc non Af Amer 63 (*)    GFR calc Af Amer 73 (*)    All other components within normal limits  GLUCOSE, CAPILLARY  URINALYSIS, ROUTINE W REFLEX MICROSCOPIC  POCT I-STAT TROPONIN I  POCT I-STAT TROPONIN I   Ct Head Wo Contrast  03/02/2013  *RADIOLOGY REPORT*  Clinical Data:  Larey Seat.  Trauma to the right side of the face.  CT HEAD WITHOUT CONTRAST CT MAXILLOFACIAL WITHOUT CONTRAST  Technique:  Multidetector CT imaging of the head and maxillofacial structures were performed using the standard protocol without intravenous contrast. Multiplanar CT image reconstructions of the maxillofacial structures were also generated.  Comparison:   None.  CT HEAD  Findings: The brain shows mild generalized atrophy.  There is no sign of acute infarction, mass lesion, hemorrhage, hydrocephalus or extra-axial collection.  There are a few punctate cortical calcifications not likely to be significant.  No hydrocephalus, no skull fracture.  Soft tissue laceration lateral to the right orbit is evident.  No fluid in the sinuses.  IMPRESSION: No skull fracture.  No intracranial abnormality.  Soft tissue laceration lateral to the right orbit.  CT MAXILLOFACIAL  Findings:   No evidence of facial fracture.  No fluid in the sinuses.  Soft tissue laceration seen lateral to the right orbit. The globe appears  intact.  No intraorbital injury evident.  Carotid artery calcification incidentally noted.  Previous changes of carotid endarterectomy on the left.  IMPRESSION: No evidence of facial fracture.  Soft tissue laceration lateral to the right orbit.   Original Report Authenticated By: Paulina Fusi, M.D.    Ct Maxillofacial Wo Cm  03/02/2013  *RADIOLOGY REPORT*  Clinical Data:  Larey Seat.  Trauma to the right side of the face.  CT HEAD WITHOUT CONTRAST CT MAXILLOFACIAL WITHOUT CONTRAST  Technique:  Multidetector CT imaging of the head and maxillofacial structures were performed using the standard protocol without intravenous contrast. Multiplanar CT image reconstructions of the maxillofacial structures were also generated.  Comparison:   None.  CT HEAD  Findings: The brain shows mild generalized atrophy.  There is no sign of acute infarction, mass lesion, hemorrhage, hydrocephalus or extra-axial collection.  There are a few punctate cortical calcifications not likely to be significant.  No hydrocephalus, no skull fracture.  Soft tissue laceration lateral to the right orbit is evident.  No fluid in the sinuses.  IMPRESSION: No skull fracture.  No intracranial abnormality.  Soft tissue laceration lateral to the right orbit.  CT MAXILLOFACIAL  Findings:   No evidence of facial fracture.  No fluid in the sinuses.  Soft tissue laceration seen lateral to the right orbit. The globe appears intact.  No intraorbital injury evident.  Carotid artery calcification incidentally noted.  Previous changes of carotid endarterectomy on the left.  IMPRESSION: No evidence of facial fracture.  Soft tissue laceration lateral to the right orbit.   Original Report Authenticated By: Paulina Fusi, M.D.      Diagnosis: 1. Orthostatic hypotension resulting in syncope 2. Facial laceration and contusion    MDM  Patient presented to the ER for evaluation after a fall. Patient reports that he has frequent episodes of dizziness, especially when he  stands up quickly. Patient reports that he has been checking his blood pressure daily at times it is quite low, as low as 80 systolic. He is on multiple blood pressure medications. Patient was monitored here in the ER, no arrhythmia noted.  CT head and facial bones was negative. He had a laceration to the right of his orbital that required sutures. Sutures placed by PA. She has continued to do well. The remainder of the workup was unremarkable. Patient is ambulating in the ER without difficulty. He was discharged, is to followup with his Dr. in the morning for repeat blood pressure check and possible medication changes.       Gilda Crease, MD 03/02/13 2100

## 2013-03-02 NOTE — ED Provider Notes (Signed)
LACERATION REPAIR Performed by: Wynetta Emery Authorized by: Wynetta Emery Consent: Verbal consent obtained. Risks and benefits: risks, benefits and alternatives were discussed Consent given by: patient Patient identity confirmed: Wrist band  Prepped and Draped in normal sterile fashion  Tetanus: UTD  Laceration Location: right temple  Laceration Length: 2cm  Anesthesia: local  Local anesthetic: 2% with epinephrine  Anesthetic total: 3 ml  Irrigation method: syringe  Amount of cleaning: copious   Wound explored to depth in good light on a bloodless field with no foreign bodies seen or palpated.   Skin closure: 6-0 point  Number of sutures: 2  Technique: simple interupted  Patient tolerance: Patient tolerated the procedure well with no immediate complications.  Antibx ointment applied. Instructions for care discussed verbally and patient provided with additional written instructions for homecare and f/u.  Wynetta Emery, PA-C 03/02/13 2343

## 2013-03-07 NOTE — ED Provider Notes (Signed)
Medical screening examination/treatment/procedure(s) were performed by non-physician practitioner and as supervising physician I was immediately available for consultation/collaboration.  Christopher J. Pollina, MD 03/07/13 2347 

## 2013-03-09 ENCOUNTER — Other Ambulatory Visit (HOSPITAL_COMMUNITY): Payer: Self-pay | Admitting: Cardiovascular Disease

## 2013-03-09 DIAGNOSIS — R0989 Other specified symptoms and signs involving the circulatory and respiratory systems: Secondary | ICD-10-CM

## 2013-03-09 DIAGNOSIS — I255 Ischemic cardiomyopathy: Secondary | ICD-10-CM

## 2013-03-09 DIAGNOSIS — I509 Heart failure, unspecified: Secondary | ICD-10-CM

## 2013-03-09 DIAGNOSIS — I447 Left bundle-branch block, unspecified: Secondary | ICD-10-CM

## 2013-03-09 DIAGNOSIS — K551 Chronic vascular disorders of intestine: Secondary | ICD-10-CM

## 2013-03-23 ENCOUNTER — Ambulatory Visit (HOSPITAL_COMMUNITY)
Admission: RE | Admit: 2013-03-23 | Discharge: 2013-03-23 | Disposition: A | Discharge status: Home / Self Care | Payer: Medicare Other | Source: Ambulatory Visit | Attending: Cardiovascular Disease | Admitting: Cardiovascular Disease

## 2013-03-23 DIAGNOSIS — K551 Chronic vascular disorders of intestine: Secondary | ICD-10-CM

## 2013-03-23 DIAGNOSIS — E785 Hyperlipidemia, unspecified: Secondary | ICD-10-CM | POA: Insufficient documentation

## 2013-03-23 DIAGNOSIS — I079 Rheumatic tricuspid valve disease, unspecified: Secondary | ICD-10-CM | POA: Insufficient documentation

## 2013-03-23 DIAGNOSIS — I509 Heart failure, unspecified: Secondary | ICD-10-CM

## 2013-03-23 DIAGNOSIS — I255 Ischemic cardiomyopathy: Secondary | ICD-10-CM

## 2013-03-23 DIAGNOSIS — I2589 Other forms of chronic ischemic heart disease: Secondary | ICD-10-CM | POA: Insufficient documentation

## 2013-03-23 DIAGNOSIS — I2510 Atherosclerotic heart disease of native coronary artery without angina pectoris: Secondary | ICD-10-CM | POA: Insufficient documentation

## 2013-03-23 DIAGNOSIS — I447 Left bundle-branch block, unspecified: Secondary | ICD-10-CM

## 2013-03-23 DIAGNOSIS — I4891 Unspecified atrial fibrillation: Secondary | ICD-10-CM | POA: Insufficient documentation

## 2013-03-23 DIAGNOSIS — I059 Rheumatic mitral valve disease, unspecified: Secondary | ICD-10-CM | POA: Insufficient documentation

## 2013-03-23 DIAGNOSIS — R0989 Other specified symptoms and signs involving the circulatory and respiratory systems: Secondary | ICD-10-CM

## 2013-03-23 DIAGNOSIS — I379 Nonrheumatic pulmonary valve disorder, unspecified: Secondary | ICD-10-CM | POA: Insufficient documentation

## 2013-03-23 DIAGNOSIS — I10 Essential (primary) hypertension: Secondary | ICD-10-CM | POA: Insufficient documentation

## 2013-03-23 NOTE — Progress Notes (Signed)
Carotid Duplex Completed. Denna Fryberger D  

## 2013-03-23 NOTE — Progress Notes (Signed)
[facility]   2D echo completed 03/23/2013.   Cindy Fiora Weill, RDCS  

## 2013-03-30 ENCOUNTER — Ambulatory Visit (INDEPENDENT_AMBULATORY_CARE_PROVIDER_SITE_OTHER): Payer: Medicare Other | Admitting: *Deleted

## 2013-03-30 ENCOUNTER — Other Ambulatory Visit: Payer: Self-pay | Admitting: Internal Medicine

## 2013-03-30 DIAGNOSIS — I2589 Other forms of chronic ischemic heart disease: Secondary | ICD-10-CM

## 2013-03-30 DIAGNOSIS — I509 Heart failure, unspecified: Secondary | ICD-10-CM

## 2013-03-30 LAB — ICD DEVICE OBSERVATION
AL AMPLITUDE: 2.5 mv
AL IMPEDENCE ICD: 532 Ohm
AL THRESHOLD: 0.5 V
BATTERY VOLTAGE: 3.1713 V
HV IMPEDENCE: 92 Ohm
LV LEAD IMPEDENCE ICD: 836 Ohm
RV LEAD IMPEDENCE ICD: 570 Ohm
RV LEAD THRESHOLD: 0.75 V
TOT-0002: 0
TOT-0006: 20130828000000
TZAT-0001ATACH: 1
TZAT-0001ATACH: 2
TZAT-0001ATACH: 3
TZAT-0001FASTVT: 1
TZAT-0001SLOWVT: 1
TZAT-0002ATACH: NEGATIVE
TZAT-0002FASTVT: NEGATIVE
TZAT-0002SLOWVT: NEGATIVE
TZAT-0012ATACH: 150 ms
TZAT-0012ATACH: 150 ms
TZAT-0012ATACH: 150 ms
TZAT-0018ATACH: NEGATIVE
TZAT-0018FASTVT: NEGATIVE
TZAT-0019ATACH: 6 V
TZAT-0019FASTVT: 8 V
TZAT-0020ATACH: 1.5 ms
TZON-0003ATACH: 350 ms
TZON-0003VSLOWVT: 360 ms
TZON-0004SLOWVT: 32
TZON-0004VSLOWVT: 32
TZON-0005SLOWVT: 12
TZST-0001ATACH: 4
TZST-0001ATACH: 6
TZST-0001FASTVT: 3
TZST-0001FASTVT: 4
TZST-0001FASTVT: 5
TZST-0001FASTVT: 6
TZST-0001SLOWVT: 2
TZST-0001SLOWVT: 4
TZST-0002FASTVT: NEGATIVE
TZST-0002FASTVT: NEGATIVE
TZST-0002SLOWVT: NEGATIVE
TZST-0002SLOWVT: NEGATIVE
TZST-0002SLOWVT: NEGATIVE
VENTRICULAR PACING ICD: 99.95 pct
VF: 0

## 2013-03-30 NOTE — Progress Notes (Signed)
ICD check with ICM 

## 2013-04-22 ENCOUNTER — Encounter: Payer: Self-pay | Admitting: Internal Medicine

## 2013-07-13 ENCOUNTER — Other Ambulatory Visit: Payer: Self-pay | Admitting: Internal Medicine

## 2013-08-31 ENCOUNTER — Other Ambulatory Visit: Payer: Self-pay | Admitting: *Deleted

## 2013-08-31 DIAGNOSIS — I2589 Other forms of chronic ischemic heart disease: Secondary | ICD-10-CM

## 2013-08-31 MED ORDER — NITROGLYCERIN 0.4 MG SL SUBL: 0.4000 mg | SUBLINGUAL_TABLET | SUBLINGUAL | Status: DC | PRN

## 2013-08-31 NOTE — Telephone Encounter (Signed)
Rx was sent to pharmacy electronically via [employer] 

## 2013-09-07 ENCOUNTER — Encounter: Payer: Self-pay | Admitting: *Deleted

## 2013-09-09 LAB — PACEMAKER DEVICE OBSERVATION

## 2013-09-12 NOTE — Telephone Encounter (Signed)
test

## 2013-09-15 ENCOUNTER — Encounter: Payer: Self-pay | Admitting: Cardiovascular Disease

## 2013-09-19 ENCOUNTER — Other Ambulatory Visit: Payer: Self-pay | Admitting: Cardiovascular Disease

## 2013-09-19 LAB — CBC WITH DIFFERENTIAL/PLATELET
Basophils Absolute: 0 10*3/uL (ref 0.0–0.1)
Basophils Relative: 1 % (ref 0–1)
Hemoglobin: 12.9 g/dL — ABNORMAL LOW (ref 13.0–17.0)
MCHC: 35 g/dL (ref 30.0–36.0)
Monocytes Relative: 18 % — ABNORMAL HIGH (ref 3–12)
Neutro Abs: 3.8 10*3/uL (ref 1.7–7.7)
Neutrophils Relative %: 65 % (ref 43–77)
RDW: 14 % (ref 11.5–15.5)
WBC: 5.8 10*3/uL (ref 4.0–10.5)

## 2013-09-19 LAB — COMPREHENSIVE METABOLIC PANEL
AST: 53 U/L — ABNORMAL HIGH (ref 0–37)
Albumin: 4.3 g/dL (ref 3.5–5.2)
Alkaline Phosphatase: 66 U/L (ref 39–117)
Potassium: 3.7 mEq/L (ref 3.5–5.3)
Sodium: 132 mEq/L — ABNORMAL LOW (ref 135–145)
Total Protein: 8 g/dL (ref 6.0–8.3)

## 2013-09-20 ENCOUNTER — Encounter: Payer: Self-pay | Admitting: Cardiovascular Disease

## 2013-10-11 ENCOUNTER — Encounter: Payer: Self-pay | Admitting: Internal Medicine

## 2013-10-11 ENCOUNTER — Telehealth: Payer: Self-pay | Admitting: Internal Medicine

## 2013-10-11 NOTE — Telephone Encounter (Signed)
Last pacer check was 03-2013, taylor was 12-2011, sent past due letter/mt

## 2013-11-09 ENCOUNTER — Other Ambulatory Visit: Payer: Self-pay | Admitting: *Deleted

## 2013-11-09 MED ORDER — SPIRONOLACTONE 25 MG PO TABS: 25.0000 mg | ORAL_TABLET | Freq: Every day | ORAL | Status: DC

## 2013-12-06 ENCOUNTER — Encounter: Payer: Self-pay | Admitting: *Deleted

## 2013-12-19 ENCOUNTER — Other Ambulatory Visit: Payer: Self-pay | Admitting: *Deleted

## 2013-12-19 NOTE — Telephone Encounter (Signed)
Refill for lorazepam 0.5mg  refused - defer to PCP

## 2014-01-13 ENCOUNTER — Other Ambulatory Visit: Payer: Self-pay | Admitting: Internal Medicine

## 2014-02-01 ENCOUNTER — Encounter: Payer: Medicare Other | Admitting: Cardiovascular Disease

## 2014-02-08 ENCOUNTER — Encounter: Payer: Self-pay | Admitting: *Deleted

## 2014-03-02 ENCOUNTER — Other Ambulatory Visit: Payer: Self-pay | Admitting: Internal Medicine

## 2014-03-28 ENCOUNTER — Telehealth: Payer: Self-pay | Admitting: Cardiovascular Disease

## 2014-04-10 NOTE — Telephone Encounter (Signed)
04-10-14 lmm @ 456pm for pt to call [facility] and rs defib ck with Dr C/MT

## 2014-04-15 ENCOUNTER — Other Ambulatory Visit: Payer: Self-pay | Admitting: Internal Medicine

## 2014-05-11 ENCOUNTER — Other Ambulatory Visit: Payer: Self-pay | Admitting: Cardiology

## 2014-05-12 ENCOUNTER — Other Ambulatory Visit: Payer: Self-pay

## 2014-05-12 MED ORDER — POTASSIUM CHLORIDE CRYS ER 20 MEQ PO TBCR: 20.0000 meq | EXTENDED_RELEASE_TABLET | Freq: Every day | ORAL | Status: DC

## 2014-05-19 ENCOUNTER — Other Ambulatory Visit: Payer: Self-pay

## 2014-05-19 MED ORDER — CARVEDILOL 12.5 MG PO TABS: 12.5000 mg | ORAL_TABLET | Freq: Two times a day (BID) | ORAL | Status: DC

## 2014-05-20 ENCOUNTER — Other Ambulatory Visit: Payer: Self-pay | Admitting: Internal Medicine

## 2014-05-22 ENCOUNTER — Encounter: Payer: Self-pay | Admitting: Cardiology

## 2014-05-23 ENCOUNTER — Telehealth: Payer: Self-pay | Admitting: Cardiovascular Disease

## 2014-05-23 NOTE — Telephone Encounter (Signed)
05-22-14 sent past due device check letter certified/mt ° °

## 2014-05-29 ENCOUNTER — Other Ambulatory Visit: Payer: Self-pay | Admitting: *Deleted

## 2014-05-29 MED ORDER — ROSUVASTATIN CALCIUM 20 MG PO TABS: 20.0000 mg | ORAL_TABLET | Freq: Every day | ORAL | Status: DC

## 2014-05-29 MED ORDER — AMIODARONE HCL 200 MG PO TABS: 200.0000 mg | ORAL_TABLET | Freq: Every day | ORAL | Status: DC

## 2014-05-29 NOTE — Telephone Encounter (Signed)
Rx was sent to pharmacy electronically. 

## 2014-06-02 ENCOUNTER — Encounter: Payer: Self-pay | Admitting: Cardiovascular Disease

## 2014-06-02 ENCOUNTER — Ambulatory Visit (INDEPENDENT_AMBULATORY_CARE_PROVIDER_SITE_OTHER): Payer: Medicare Other | Admitting: Cardiovascular Disease

## 2014-06-02 VITALS — BP 116/82 | HR 67 | Resp 20 | Ht 73.0 in | Wt 182.7 lb

## 2014-06-02 DIAGNOSIS — I671 Cerebral aneurysm, nonruptured: Secondary | ICD-10-CM

## 2014-06-02 DIAGNOSIS — Z79899 Other long term (current) drug therapy: Secondary | ICD-10-CM

## 2014-06-02 DIAGNOSIS — Z953 Presence of xenogenic heart valve: Secondary | ICD-10-CM

## 2014-06-02 DIAGNOSIS — I5042 Chronic combined systolic (congestive) and diastolic (congestive) heart failure: Secondary | ICD-10-CM

## 2014-06-02 DIAGNOSIS — I4891 Unspecified atrial fibrillation: Secondary | ICD-10-CM

## 2014-06-02 DIAGNOSIS — E785 Hyperlipidemia, unspecified: Secondary | ICD-10-CM

## 2014-06-02 DIAGNOSIS — Z95810 Presence of automatic (implantable) cardiac defibrillator: Secondary | ICD-10-CM

## 2014-06-02 DIAGNOSIS — I10 Essential (primary) hypertension: Secondary | ICD-10-CM

## 2014-06-02 DIAGNOSIS — Z952 Presence of prosthetic heart valve: Secondary | ICD-10-CM

## 2014-06-02 DIAGNOSIS — I25810 Atherosclerosis of coronary artery bypass graft(s) without angina pectoris: Secondary | ICD-10-CM

## 2014-06-02 DIAGNOSIS — I2589 Other forms of chronic ischemic heart disease: Secondary | ICD-10-CM

## 2014-06-02 DIAGNOSIS — I739 Peripheral vascular disease, unspecified: Secondary | ICD-10-CM

## 2014-06-02 LAB — MDC_IDC_ENUM_SESS_TYPE_INCLINIC
Battery Voltage: 3.12 V
Brady Statistic AP VP Percent: 32.2 %
Brady Statistic AP VS Percent: 0.1 % — CL
Brady Statistic AS VP Percent: 67.8 %
Brady Statistic AS VS Percent: 0.1 % — CL
HighPow Impedance: 98 Ohm
Lead Channel Impedance Value: 570 Ohm
Lead Channel Impedance Value: 969 Ohm
Lead Channel Pacing Threshold Amplitude: 0.75 V
Lead Channel Pacing Threshold Pulse Width: 0.4 ms
Lead Channel Pacing Threshold Pulse Width: 0.4 ms
Lead Channel Sensing Intrinsic Amplitude: 2.3 mV
Lead Channel Sensing Intrinsic Amplitude: 20 mV
Lead Channel Setting Pacing Amplitude: 2.25 V
Lead Channel Setting Pacing Pulse Width: 0.4 ms
Lead Channel Setting Pacing Pulse Width: 0.4 ms
MDC IDC MSMT LEADCHNL LV PACING THRESHOLD AMPLITUDE: 1 V
MDC IDC MSMT LEADCHNL RA IMPEDANCE VALUE: 513 Ohm
MDC IDC MSMT LEADCHNL RV PACING THRESHOLD AMPLITUDE: 0.5 V
MDC IDC MSMT LEADCHNL RV PACING THRESHOLD PULSEWIDTH: 0.4 ms
MDC IDC SET LEADCHNL RA PACING AMPLITUDE: 2 V
MDC IDC SET LEADCHNL RV PACING AMPLITUDE: 2.5 V
MDC IDC SET LEADCHNL RV SENSING SENSITIVITY: 0.3 mV
MDC IDC SET ZONE DETECTION INTERVAL: 350 ms
MDC IDC SET ZONE DETECTION INTERVAL: 360 ms
Zone Setting Detection Interval: 300 ms
Zone Setting Detection Interval: 360 ms

## 2014-06-02 LAB — COMPREHENSIVE METABOLIC PANEL
ALT: 41 U/L (ref 0–53)
AST: 71 U/L — AB (ref 0–37)
Albumin: 4.5 g/dL (ref 3.5–5.2)
Alkaline Phosphatase: 75 U/L (ref 39–117)
BUN: 15 mg/dL (ref 6–23)
CO2: 31 mEq/L (ref 19–32)
CREATININE: 1.39 mg/dL — AB (ref 0.50–1.35)
Calcium: 10.3 mg/dL (ref 8.4–10.5)
Chloride: 85 mEq/L — ABNORMAL LOW (ref 96–112)
Glucose, Bld: 101 mg/dL — ABNORMAL HIGH (ref 70–99)
Potassium: 4.1 mEq/L (ref 3.5–5.3)
Sodium: 130 mEq/L — ABNORMAL LOW (ref 135–145)
Total Bilirubin: 0.8 mg/dL (ref 0.2–1.2)
Total Protein: 9 g/dL — ABNORMAL HIGH (ref 6.0–8.3)

## 2014-06-02 LAB — PACEMAKER DEVICE OBSERVATION

## 2014-06-02 LAB — LIPID PANEL
CHOLESTEROL: 281 mg/dL — AB (ref 0–200)
HDL: 64 mg/dL (ref >39)
LDL Cholesterol: 184 mg/dL — ABNORMAL HIGH (ref 0–99)
TRIGLYCERIDES: 163 mg/dL — AB (ref <150)
Total CHOL/HDL Ratio: 4.4 Ratio
VLDL: 33 mg/dL (ref 0–40)

## 2014-06-02 NOTE — Patient Instructions (Signed)
Your physician recommends that you schedule a follow-up appointment in: 3 months Please note that CARVEDILOL is a TWICE A DAY medication - take one 12.5 mg tablet every 12 hours Change the time of our ENALAPRIL to lunchtime - dose unchanged

## 2014-06-02 NOTE — Telephone Encounter (Signed)
06-02-14 CERTIFIED LETTER SIGNED  05-27-14 AND APPT MADE FOR 06-02-14/MT

## 2014-06-04 ENCOUNTER — Encounter: Payer: Self-pay | Admitting: Cardiovascular Disease

## 2014-06-04 DIAGNOSIS — I671 Cerebral aneurysm, nonruptured: Secondary | ICD-10-CM | POA: Insufficient documentation

## 2014-06-04 DIAGNOSIS — Z952 Presence of prosthetic heart valve: Secondary | ICD-10-CM | POA: Insufficient documentation

## 2014-06-04 NOTE — Assessment & Plan Note (Signed)
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

## 2014-06-04 NOTE — Assessment & Plan Note (Addendum)
Remote diagnosis, no recent episodes of arrhythmia recorded by his CRT-D device. Not on anticoagulation. Note remote history of gastrointestinal bleeding. No history of stroke or TIA that I'm aware of. Needs twice yearly liver function tests and thyroid function tests while taking amiodarone. This medicine was started in August of 2010. He was on Multaq before that.

## 2014-06-04 NOTE — Assessment & Plan Note (Signed)
Lab tests, which became available after he had left his appointments, suggest that he is again noncompliant with medication. We'll have to address this since he is obviously at high risk of disease progression.

## 2014-06-04 NOTE — Assessment & Plan Note (Signed)
Well controlled 

## 2014-06-04 NOTE — Assessment & Plan Note (Signed)
Currently well compensated

## 2014-06-04 NOTE — Assessment & Plan Note (Signed)
LVEF most recently estimated to be 40-45%, NYHA functional class II, clinically euvolemic. Appropriate treatment with ACE inhibitors and beta blockers

## 2014-06-04 NOTE — Assessment & Plan Note (Signed)
Prosthetic valve function is normal by echocardiogram 2014

## 2014-06-04 NOTE — Assessment & Plan Note (Signed)
Currently asymptomatic 15 years status post second bypass procedure, all grafts patent by cardiac catheterization 2010.

## 2014-06-04 NOTE — Assessment & Plan Note (Signed)
Normal device function. No history of indolent infection of previous left-sided system that has been removed. No reprogramming changes necessary.

## 2014-06-04 NOTE — Assessment & Plan Note (Signed)
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 Plans for percutaneous treatment of this aneurysm were in place, but as far as I can tell from records review it was never treated.

## 2014-06-04 NOTE — Progress Notes (Signed)
Patient ID: [name], male   DOB: 1941-08-15, 73 y.o.   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 Terance Ice. He has a rich history of cardiac disease.   He underwent his initial CABG in 1992, redo bypass in 2004 for occluded grafts. Cardiac catheterization in 2010 showed patent LIMA to LAD, free RIMA to OM, SVG to ramus, 80% stenosis in a small PLA branch with inferior hypokinesis.   The catheterization was done just prior to aortic valve replacement for stenosis with a biological prosthesis (Mosaic 29 mm) in 2010. He had problems with congestive heart failure requiring inotropic support not long after that surgery, but has subsequently had substantial functional improvement.   Left ventricular systolic function was severely depressed at one point at 20-25%. His most recent echocardiogram in April 2014 showed an ejection fraction of 40-45% with inferior wall motion abnormalities per  He had implantation of a CRT-D device in 2008 (LBBB), system removal for indolent infection in July 2013 with reimplantation in August 2013 ([employer] [place], Dr. Lovena Le), history of atrial flutter status post ablation, sinus node dysfunction, paroxysmal atrial fibrillation. Atrial fibrillation has not been detected in a very long time. He has a remote history of gastrointestinal bleeding. He is not receiving anti-coagulation, but does take aspirin.  Has a history of previous right and left carotid endarterectomy. Carotid duplex ultrasonography in April of 2014 showed minor plaque bilaterally in the carotids and an occluded right vertebral artery (chronic finding). 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 There was a plan for percutaneous coiling of this aneurysm. As  far as I can tell from chart review, this was never performs his other health problems take precedence. There is also mention of significant right renal artery stenosis, but no recent evaluation has been necessary.  He has a history of spotty compliance with medications, particularly statin therapy as well as chronic alcohol use. Other medical problems include remote history of gouty arthritis. He is very active physically. This is confirmed by interrogation of his defibrillator that consistently show 7-8 hours of physical activity on a daily basis.  He complains of shortness of breath frequently but very rarely has chest pain. He denies edema or palpitations and has not had any defibrillator shocks. 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   Interrogation of his defibrillator today shows normal device function. Thoracic impedance has been steady for the last 6 months, except for a couple of days in May when he briefly crossed the threshold with rapid resolution. He has not had any episodes of meaningful atrial or ventricular arrhythmia. There is 100% biventricular pacing (also about 30% atrial pacing) lead parameters are excellent.   Allergies  Allergen Reactions  . Atorvastatin Other (See Comments)    Joint pain.    Current Outpatient Prescriptions  Medication Sig Dispense Refill  . amiodarone (PACERONE) 200 MG tablet Take 1 tablet (200 mg total) by mouth daily.  30 tablet  2  . aspirin EC 81 MG tablet Take by mouth daily.       . carvedilol (COREG) 12.5 MG tablet Take 1 tablet (12.5 mg total) by mouth 2 (two) times daily with a meal.  60 tablet  1  . Cyanocobalamin (VITAMIN B-12 PO)  Take 1 tablet by mouth daily.      . enalapril (VASOTEC) 10 MG tablet Take 10 mg by mouth daily.      . furosemide (LASIX) 40 MG tablet TAKE TWO TABLETS BY MOUTH DAILY  60 tablet  6  . LORazepam (ATIVAN) 0.5 MG tablet Take 0.5 mg by mouth daily.       . magnesium oxide (MAG-OX) 400 MG tablet Take  400 mg by mouth 2 (two) times daily.      . megestrol (MEGACE) 40 MG/ML suspension Take 600 mg by mouth daily.      . nitroGLYCERIN (NITROSTAT) 0.4 MG SL tablet Place 1 tablet (0.4 mg total) under the tongue every 5 (five) minutes as needed. Chest pain  25 tablet  3  . potassium chloride SA (K-DUR,KLOR-CON) 20 MEQ tablet Take 1 tablet (20 mEq total) by mouth daily.  30 tablet  6  . rosuvastatin (CRESTOR) 20 MG tablet Take 1 tablet (20 mg total) by mouth daily.  30 tablet  2  . spironolactone (ALDACTONE) 25 MG tablet Take 1 tablet (25 mg total) by mouth daily.  30 tablet  9   No current facility-administered medications for this visit.    Past Medical History  Diagnosis Date  . Chest pain   . Hypertension   . Hyperlipidemia   . 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 H/O atrial flutter     ablation in sinus rhythm   . GERD (gastroesophageal reflux disease)     with gas and bloating probably cause of chest pain  . Ischemic cardiomyopathy     H/O with EF at this cath of 45%  . H/O: gout   . 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 Myocardial infarction   . Anginal pain   . Dysrhythmia   . Anxiety   . Shortness of breath   . Pacemaker   . CHF (congestive heart failure)   . Arthritis     gout    Past Surgical History  Procedure Laterality Date  . Coronary artery bypass graft  1993    redo in 2000, he has had multiple MI previous to both procedures.  . Cardiac catheterization  Dec 18, 2003    with a presentation of atrial flutter with rapid ventricular response. He had a cath and two stents to his PDA after his SVG to his RCA. His other graft were patent. He had LV with EF of 30%.   . Carotid endarterectomy      left in December 2003 and know renal artery stenosis of 60-70%  . Valve replacement  2010    No family history on file.  History   Social History  . Marital Status: Married    Spouse  Name: N/A    Number of Children: N/A  . Years of Education: N/A   Occupational History  . Not on file.   Social History Main Topics  . Smoking status: Never Smoker   . Smokeless tobacco: Current User    Types: Chew  . Alcohol Use: 8.4 oz/week    14 Cans of beer per week  . Drug Use: No  . Sexual Activity: Not on file   Other Topics Concern  . Not on file   Social History Narrative  . No narrative on file    Review of systems: Functional class II exertional dyspnea. The patient specifically denies any recent chest pain  at rest or with exertion, dyspnea at rest, orthopnea, paroxysmal nocturnal dyspnea, syncope, palpitations, focal neurological deficits, intermittent claudication, lower extremity edema, unexplained weight gain, cough, hemoptysis or wheezing.  The patient also denies abdominal pain, nausea, vomiting, dysphagia, diarrhea, constipation, polyuria, polydipsia, dysuria, hematuria, frequency, urgency, abnormal bleeding or bruising, fever, chills, unexpected weight changes, mood swings, change in skin or hair texture, change in voice quality, auditory or visual problems, allergic reactions or rashes, new musculoskeletal complaints other than usual "aches and pains".   PHYSICAL EXAM BP 116/82  Pulse 67  Resp 20  Ht $R'6\' 1"'oV$  (1.854 m)  Wt 182 lb 11.2 oz (82.872 kg)  BMI 24.11 kg/m2  General: Alert, oriented x3, no distress Head: no evidence of trauma, PERRL, EOMI, no exophtalmos or lid lag, no myxedema, no xanthelasma; normal ears, nose and oropharynx Neck: normal jugular venous pulsations and no hepatojugular reflux; brisk carotid pulses without delay and no carotid bruits Chest: clear to auscultation, no signs of consolidation by percussion or palpation, normal fremitus, symmetrical and full respiratory excursions; bilateral subclavian scars of defibrillator pocket, current device on the right side, sternotomy scar, all well-healed Cardiovascular: Inferolateral  displacement of the apical impulse, regular rhythm, normal first and paradoxical splitting of the second heart sounds, early peaking grade 1-2/6 aortic ejection murmurs, rubs or gallops Abdomen: no tenderness or distention, no masses by palpation, no abnormal pulsatility or arterial bruits, normal bowel sounds, no hepatosplenomegaly Extremities: no clubbing, cyanosis or edema; 2+ radial, ulnar and brachial pulses bilaterally; 2+ right femoral, posterior tibial and dorsalis pedis pulses; 2+ left femoral, posterior tibial and dorsalis pedis pulses; no subclavian or femoral bruits Neurological: grossly nonfocal   EKG: Atrial sensed biventricular paced  Lipid Panel     Component Value Date/Time   CHOL 281* 06/02/2014 1154   TRIG 163* 06/02/2014 1154   HDL 64 06/02/2014 1154   CHOLHDL 4.4 06/02/2014 1154   VLDL 33 06/02/2014 1154   LDLCALC 184* 06/02/2014 1154    BMET    Component Value Date/Time   NA 130* 06/02/2014 1154   K 4.1 06/02/2014 1154   CL 85* 06/02/2014 1154   CO2 31 06/02/2014 1154   GLUCOSE 101* 06/02/2014 1154   BUN 15 06/02/2014 1154   CREATININE 1.39* 06/02/2014 1154   CREATININE 1.14 03/02/2013 1430   CALCIUM 10.3 06/02/2014 1154   GFRNONAA 63* 03/02/2013 1430   GFRAA 73* 03/02/2013 1430     ASSESSMENT AND PLAN CAD s/p redo CABG Currently asymptomatic 15 years status post second bypass procedure, all grafts patent by cardiac catheterization 2010.  ATRIAL FIBRILLATION Remote diagnosis, no recent episodes of arrhythmia recorded by his CRT-D device. Not on anticoagulation. Note remote history of gastrointestinal bleeding. No history of stroke or TIA that I'm aware of. Needs twice yearly liver function tests and thyroid function tests while taking amiodarone. This medicine was started in August of 2010. He was on Multaq before that.  CARDIOMYOPATHY, ISCHEMIC LVEF most recently estimated to be 40-45%, NYHA functional class II, clinically euvolemic. Appropriate treatment with ACE  inhibitors and beta blockers  CRT-D [employer] Normal device function. No history of indolent infection of previous left-sided system that has been removed. No reprogramming changes necessary.  COMBINED HEART FAILURE, CHRONIC Currently well compensated  H/O aortic valve replacement with porcine valve Prosthetic valve function is normal by echocardiogram 2014  HYPERTENSION Well controlled  HYPERLIPIDEMIA Lab tests, which became available after he had left his appointments, suggest that he is again noncompliant with medication. We'll  have to address this since he is obviously at high risk of disease progression.  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 Plans for percutaneous treatment of this aneurysm were in place, but as far as I can tell from records review it was never treated.   Orders Placed This Encounter  Procedures  . Comp Met (CMET)  . Lipid Profile  . EKG 12-Lead   No orders of the defined types were placed in this encounter.    Holli Humbles, MD, [facility] [phone] office [phone] pager

## 2014-06-05 ENCOUNTER — Telehealth: Payer: Self-pay | Admitting: *Deleted

## 2014-06-05 DIAGNOSIS — Z79899 Other long term (current) drug therapy: Secondary | ICD-10-CM

## 2014-06-05 DIAGNOSIS — E782 Mixed hyperlipidemia: Secondary | ICD-10-CM

## 2014-06-05 NOTE — Telephone Encounter (Signed)
Lab results called to patient's daughter.  She will check with him to see if he has stopped his statin and restart.  If he is still taking she will call me back.  Order placed for repeat labs in 3 months and mailed to patient.

## 2014-06-05 NOTE — Telephone Encounter (Signed)
Message copied by Vita BarleyLASSITER, Anatalia Kronk A on Mon Jun 05, 2014  4:19 PM ------      Message from: Thurmon FairROITORU, MIHAI      Created: Mon Jun 05, 2014  3:00 PM       Looks like he may have stopped his statin again. He needs to go back on it and repeat labs in 3 months please ------

## 2014-06-06 ENCOUNTER — Telehealth: Payer: Self-pay | Admitting: Cardiovascular Disease

## 2014-06-06 DIAGNOSIS — E782 Mixed hyperlipidemia: Secondary | ICD-10-CM

## 2014-06-06 DIAGNOSIS — Z79899 Other long term (current) drug therapy: Secondary | ICD-10-CM

## 2014-06-06 NOTE — Telephone Encounter (Signed)
Returning your call from yesterday. °

## 2014-06-06 NOTE — Telephone Encounter (Signed)
Mr [name] states he is taking Crestor 20mg  daily.  He missed 3-4 days due to running out of his med but normally takes regularly.  Told to continue current dose and will send Dr. Salena Saner this info for review.  Patient notified Dr. Salena Saner is out of the [place] so it will be several weeks before I have a response.  Voiced understanding.

## 2014-06-27 NOTE — Telephone Encounter (Signed)
Please continue crestor,  add zetia 10 mg daily and recheck CMET/lipids in 2 months

## 2014-06-29 MED ORDER — EZETIMIBE 10 MG PO TABS: 10.0000 mg | ORAL_TABLET | Freq: Every day | ORAL | Status: DC

## 2014-06-29 NOTE — Addendum Note (Signed)
Addended by: Ronnell GuadalajaraLASSITER, Aryelle Figg A on: 06/29/2014 07:57 AM   Modules accepted: Orders

## 2014-06-29 NOTE — Telephone Encounter (Signed)
LM Dr. Salena Saner wants him to continue Crestor and add Zetia 10mg  daily and have labs rechecked in 2 months.  Rx sent to pharmacy and lab order mailed to patient.

## 2014-07-11 ENCOUNTER — Encounter: Payer: Self-pay | Admitting: Cardiovascular Disease

## 2014-09-11 ENCOUNTER — Encounter: Payer: Medicare Other | Admitting: Cardiovascular Disease

## 2014-09-19 ENCOUNTER — Other Ambulatory Visit: Payer: Self-pay | Admitting: *Deleted

## 2014-09-19 ENCOUNTER — Ambulatory Visit (INDEPENDENT_AMBULATORY_CARE_PROVIDER_SITE_OTHER): Payer: Medicare Other | Admitting: Cardiovascular Disease

## 2014-09-19 ENCOUNTER — Encounter: Payer: Self-pay | Admitting: Cardiovascular Disease

## 2014-09-19 ENCOUNTER — Telehealth: Payer: Self-pay | Admitting: *Deleted

## 2014-09-19 VITALS — BP 132/86 | HR 69 | Ht 73.0 in | Wt 196.0 lb

## 2014-09-19 DIAGNOSIS — R5383 Other fatigue: Secondary | ICD-10-CM

## 2014-09-19 DIAGNOSIS — I4891 Unspecified atrial fibrillation: Secondary | ICD-10-CM

## 2014-09-19 DIAGNOSIS — Z79899 Other long term (current) drug therapy: Secondary | ICD-10-CM

## 2014-09-19 DIAGNOSIS — I679 Cerebrovascular disease, unspecified: Secondary | ICD-10-CM

## 2014-09-19 DIAGNOSIS — I5042 Chronic combined systolic (congestive) and diastolic (congestive) heart failure: Secondary | ICD-10-CM

## 2014-09-19 DIAGNOSIS — Z953 Presence of xenogenic heart valve: Secondary | ICD-10-CM

## 2014-09-19 DIAGNOSIS — R5381 Other malaise: Secondary | ICD-10-CM

## 2014-09-19 DIAGNOSIS — I2589 Other forms of chronic ischemic heart disease: Secondary | ICD-10-CM

## 2014-09-19 DIAGNOSIS — Z951 Presence of aortocoronary bypass graft: Secondary | ICD-10-CM

## 2014-09-19 DIAGNOSIS — E785 Hyperlipidemia, unspecified: Secondary | ICD-10-CM

## 2014-09-19 DIAGNOSIS — I480 Paroxysmal atrial fibrillation: Secondary | ICD-10-CM

## 2014-09-19 DIAGNOSIS — I25810 Atherosclerosis of coronary artery bypass graft(s) without angina pectoris: Secondary | ICD-10-CM

## 2014-09-19 DIAGNOSIS — E782 Mixed hyperlipidemia: Secondary | ICD-10-CM

## 2014-09-19 DIAGNOSIS — I10 Essential (primary) hypertension: Secondary | ICD-10-CM

## 2014-09-19 DIAGNOSIS — Z952 Presence of prosthetic heart valve: Secondary | ICD-10-CM

## 2014-09-19 LAB — MDC_IDC_ENUM_SESS_TYPE_INCLINIC
Battery Voltage: 3.1 V
Brady Statistic AP VP Percent: 26.53 %
Brady Statistic RV Percent Paced: 99.94 %
Date Time Interrogation Session: 20150929094206
HIGH POWER IMPEDANCE MEASURED VALUE: 78 Ohm
HighPow Impedance: 133 Ohm
HighPow Impedance: 342 Ohm
Lead Channel Impedance Value: 494 Ohm
Lead Channel Pacing Threshold Amplitude: 0.625 V
Lead Channel Pacing Threshold Pulse Width: 0.4 ms
Lead Channel Pacing Threshold Pulse Width: 0.4 ms
Lead Channel Sensing Intrinsic Amplitude: 1.5 mV
Lead Channel Sensing Intrinsic Amplitude: 14.75 mV
Lead Channel Setting Pacing Amplitude: 2 V
Lead Channel Setting Pacing Amplitude: 2 V
Lead Channel Setting Pacing Pulse Width: 0.4 ms
MDC IDC MSMT LEADCHNL LV IMPEDANCE VALUE: 323 Ohm
MDC IDC MSMT LEADCHNL LV IMPEDANCE VALUE: 646 Ohm
MDC IDC MSMT LEADCHNL LV IMPEDANCE VALUE: 817 Ohm
MDC IDC MSMT LEADCHNL LV PACING THRESHOLD AMPLITUDE: 1 V
MDC IDC MSMT LEADCHNL LV PACING THRESHOLD PULSEWIDTH: 0.4 ms
MDC IDC MSMT LEADCHNL RA PACING THRESHOLD AMPLITUDE: 1 V
MDC IDC MSMT LEADCHNL RA SENSING INTR AMPL: 1.875 mV
MDC IDC MSMT LEADCHNL RV IMPEDANCE VALUE: 456 Ohm
MDC IDC MSMT LEADCHNL RV SENSING INTR AMPL: 17.125 mV
MDC IDC SET LEADCHNL LV PACING AMPLITUDE: 2 V
MDC IDC SET LEADCHNL RV PACING PULSEWIDTH: 0.4 ms
MDC IDC SET LEADCHNL RV SENSING SENSITIVITY: 0.3 mV
MDC IDC SET ZONE DETECTION INTERVAL: 360 ms
MDC IDC STAT BRADY AP VS PERCENT: 0.01 %
MDC IDC STAT BRADY AS VP PERCENT: 73.41 %
MDC IDC STAT BRADY AS VS PERCENT: 0.04 %
MDC IDC STAT BRADY RA PERCENT PACED: 26.55 %
Zone Setting Detection Interval: 300 ms
Zone Setting Detection Interval: 350 ms
Zone Setting Detection Interval: 360 ms

## 2014-09-19 NOTE — Patient Instructions (Addendum)
If your weight goes above 195 lbs. Take an extra 40mg  of Furosemide a day.  When your weight is 195 or below take ONLY 40mg  of Furosemide daily.  Remote monitoring is used to monitor your ICD from home. This monitoring reduces the number of office visits required to check your device to one time per year. It allows us to keep an eye on the functioning of your device to ensure it is working properly. You are scheduled for a device check from home on 12-21-2014. You may send your transmission at any time that day. If you have a wireless device, the transmission will be sent automatically. After your physician reviews your transmission, you will receive a postcard with your next transmission date.  Your physician recommends that you schedule a follow-up appointment in: 6 months with Dr.Croitoru

## 2014-09-19 NOTE — Telephone Encounter (Signed)
Notified he needs FASTING lab done in December.  Voiced understanding and requested order be mailed.

## 2014-09-19 NOTE — Progress Notes (Signed)
Patient ID: [name], male   DOB: 22-Feb-1941, 73 y.o.   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 Susa Griffinsichard [name]. He has a rich history of cardiac disease, but over the last many years has been well compensated.   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 He is limited by knee pain rather than shortness of breath. He denies lower extremity edema  Interrogation of his defibrillator shows normal device function. He has not had any atrial fibrillation in the last 12 months. The device has not recorded VT or VF. The Optivol suggests fluid buildup over the last 4 weeks or so. He has 100% biventricular pacing and only 26% atrial pacing.  He underwent his initial CABG in 1992, redo bypass in 2004 for occluded grafts. Cardiac catheterization in 2010 showed patent LIMA to LAD, free RIMA to OM, SVG to ramus, 80% stenosis in a small PLA branch with inferior hypokinesis.  The catheterization was done just prior to aortic valve replacement for stenosis with a biological prosthesis ([name] 29 mm) in 2010. He had problems with congestive heart failure requiring inotropic support not long after that surgery, but has subsequently had substantial functional improvement.  Left ventricular systolic function was severely depressed at one point at 20-25%. His most recent echocardiogram in April 2014 showed an ejection fraction of 40-45% with inferior wall motion abnormalities per  He had implantation of a CRT-D device in 2008 (LBBB), system removal for indolent infection in July 2013 with reimplantation in August 2013 ([employer] [name], Dr. Ladona [name]), history of atrial flutter status post ablation, sinus node dysfunction, paroxysmal atrial fibrillation. Atrial fibrillation has not been detected in a very long time. He has a remote history of gastrointestinal bleeding. He is not receiving  anti-coagulation, but does take aspirin.  Has a history of previous right and left carotid endarterectomy. Carotid duplex ultrasonography in April of 2014 showed minor plaque bilaterally in the carotids and an occluded right vertebral artery (chronic finding). 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 There was a plan for percutaneous coiling of this aneurysm. As far as I can tell from chart review, this was never performed due to his other health problems. There is also mention of significant right renal artery stenosis, but no recent evaluation has been necessary.  He has a history of spotty compliance with medications, particularly statin therapy as well as chronic alcohol use. Other medical problems include remote history of gouty arthritis. He is very active physically. This is confirmed by interrogation of his defibrillator that consistently show 7-8 hours of physical activity on a daily basis.    Allergies  Allergen Reactions  . Atorvastatin Other (See Comments)    Joint pain.    Current Outpatient Prescriptions  Medication Sig Dispense Refill  . amiodarone (PACERONE) 200 MG tablet Take 1 tablet (200 mg total) by mouth daily.  30 tablet  2  . aspirin EC 81 MG tablet Take by mouth daily.       . carvedilol (COREG) 12.5 MG tablet Take 1 tablet (12.5 mg total) by mouth 2 (two) times daily with a meal.  60 tablet  1  . Cyanocobalamin (VITAMIN B-12 PO) Take 1 tablet by mouth daily.      . enalapril (VASOTEC) 10 MG tablet Take 10 mg by mouth  daily.      . ezetimibe (ZETIA) 10 MG tablet Take 1 tablet (10 mg total) by mouth daily.  30 tablet  5  . furosemide (LASIX) 40 MG tablet TAKE TWO TABLETS BY MOUTH DAILY  60 tablet  6  . LORazepam (ATIVAN) 0.5 MG tablet Take 0.5 mg by mouth daily.       . magnesium oxide (MAG-OX) 400 MG tablet Take 400 mg by mouth 2 (two) times daily.      . megestrol (MEGACE) 40 MG/ML  suspension Take 600 mg by mouth daily.      . nitroGLYCERIN (NITROSTAT) 0.4 MG SL tablet Place 1 tablet (0.4 mg total) under the tongue every 5 (five) minutes as needed. Chest pain  25 tablet  3  . potassium chloride SA (K-DUR,KLOR-CON) 20 MEQ tablet Take 1 tablet (20 mEq total) by mouth daily.  30 tablet  6  . rosuvastatin (CRESTOR) 20 MG tablet Take 1 tablet (20 mg total) by mouth daily.  30 tablet  2  . spironolactone (ALDACTONE) 25 MG tablet Take 1 tablet (25 mg total) by mouth daily.  30 tablet  9   No current facility-administered medications for this visit.    Past Medical History  Diagnosis Date  . Chest pain   . Hypertension   . Hyperlipidemia   . 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 H/O atrial flutter     ablation in sinus rhythm   . GERD (gastroesophageal reflux disease)     with gas and bloating probably cause of chest pain  . Ischemic cardiomyopathy     H/O with EF at this cath of 45%  . H/O: gout   . 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 Myocardial infarction   . Anginal pain   . Dysrhythmia   . Anxiety   . Shortness of breath   . Pacemaker   . CHF (congestive heart failure)   . Arthritis     gout    Past Surgical History  Procedure Laterality Date  . Coronary artery bypass graft  1993    redo in 2000, he has had multiple MI previous to both procedures.  . Cardiac catheterization  Dec 18, 2003    with a presentation of atrial flutter with rapid ventricular response. He had a cath and two stents to his PDA after his SVG to his RCA. His other graft were patent. He had LV with EF of 30%.   . Carotid endarterectomy      left in December 2003 and know renal artery stenosis of 60-70%  . Valve replacement  2010    No family history on file.  History   Social History  . Marital Status: Married    Spouse Name: N/A    Number of Children: N/A  . Years of Education: N/A    Occupational History  . Not on file.   Social History Main Topics  . Smoking status: Never Smoker   . Smokeless tobacco: Current User    Types: Chew  . Alcohol Use: 8.4 oz/week    14 Cans of beer per week  . Drug Use: No  . Sexual Activity: Not on file   Other Topics Concern  . Not on file   Social History Narrative  . No narrative on file    Review of systems: The patient specifically denies any chest pain at rest or with exertion, dyspnea at  rest or with exertion, orthopnea, paroxysmal nocturnal dyspnea, syncope, palpitations, focal neurological deficits, intermittent claudication, lower extremity edema, unexplained weight gain, cough, hemoptysis or wheezing.  The patient also denies abdominal pain, nausea, vomiting, dysphagia, diarrhea, constipation, polyuria, polydipsia, dysuria, hematuria, frequency, urgency, abnormal bleeding or bruising, fever, chills, unexpected weight changes, mood swings, change in skin or hair texture, change in voice quality, auditory or visual problems, allergic reactions or rashes, new musculoskeletal complaints other than usual "aches and pains".   PHYSICAL EXAM BP 132/86  Pulse 69  Ht 6\' 1"  (1.854 m)  Wt 88.905 kg (196 lb)  BMI 25.86 kg/m2 General: Alert, oriented x3, no distress  Head: no evidence of trauma, PERRL, EOMI, no exophtalmos or lid lag, no myxedema, no xanthelasma; normal ears, nose and oropharynx  Neck: normal jugular venous pulsations and no hepatojugular reflux; brisk carotid pulses without delay and no carotid bruits  Chest: clear to auscultation, no signs of consolidation by percussion or palpation, normal fremitus, symmetrical and full respiratory excursions; bilateral subclavian scars of defibrillator pocket, current device on the right side, sternotomy scar, all well-healed  Cardiovascular: Inferolateral displacement of the apical impulse, regular rhythm, normal first and paradoxical splitting of the second heart sounds,  early peaking grade 1-2/6 aortic ejection murmurs, rubs or gallops  Abdomen: no tenderness or distention, no masses by palpation, no abnormal pulsatility or arterial bruits, normal bowel sounds, no hepatosplenomegaly  Extremities: no clubbing, cyanosis or edema; 2+ radial, ulnar and brachial pulses bilaterally; 2+ right femoral, posterior tibial and dorsalis pedis pulses; 2+ left femoral, posterior tibial and dorsalis pedis pulses; no subclavian or femoral bruits  Neurological: grossly nonfocal   EKG: Atrial sensed biventricular paced  Lipid Panel     Component Value Date/Time   CHOL 281* 06/02/2014 1154   TRIG 163* 06/02/2014 1154   HDL 64 06/02/2014 1154   CHOLHDL 4.4 06/02/2014 1154   VLDL 33 06/02/2014 1154   LDLCALC 184* 06/02/2014 1154    BMET    Component Value Date/Time   NA 130* 06/02/2014 1154   K 4.1 06/02/2014 1154   CL 85* 06/02/2014 1154   CO2 31 06/02/2014 1154   GLUCOSE 101* 06/02/2014 1154   BUN 15 06/02/2014 1154   CREATININE 1.39* 06/02/2014 1154   CREATININE 1.14 03/02/2013 1430   CALCIUM 10.3 06/02/2014 1154   GFRNONAA 63* 03/02/2013 1430   GFRAA 73* 03/02/2013 1430     ASSESSMENT AND PLAN CAD s/p redo CABG  Currently asymptomatic 15 years status post second bypass procedure, all grafts patent by cardiac catheterization 2010.  ATRIAL FIBRILLATION  Remote diagnosis, no recent episodes of arrhythmia recorded by his CRT-D device. Not on anticoagulation. Note remote history of gastrointestinal bleeding. No history of stroke or TIA that I'm aware of.  Needs twice yearly liver function tests and thyroid function tests while taking amiodarone. This medicine was started in August of 2010. He was on Multaq before that.  Normal liver function tests in June. He is due thyroid function studies. CARDIOMYOPATHY, ISCHEMIC  LVEF most recently estimated to be 40-45%, NYHA functional class II, clinically euvolemic. Appropriate treatment with ACE inhibitors and beta blockers  CRT-D  [employer]  Normal device function. No history of indolent infection of previous left-sided system that has been removed. No reprogramming changes necessary.  COMBINED HEART FAILURE, CHRONIC  He has minimal elevation in his jugular venous pulsations and his optivol suggests fluid buildup. Clinically he has no change in his dyspnea and remains just as active as always.  I recommended that he start weighing himself on a daily basis, which he has not been doing and to take an extra 40 mg tablet of furosemide on days that his weight exceeds 195 pounds on his home scale. H/O aortic valve replacement with porcine valve  Prosthetic valve function is normal by echocardiogram 2014  HYPERTENSION  Well controlled  HYPERLIPIDEMIA  Lipids were elevated in June. He was not taking his statin. 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  Anterior communicating artery aneurysm  4.5 mm aneurysm was discovered incidentally at the time of preoperative carotid angiography. Plans for percutaneous treatment of this aneurysm were in place, but as far as I can tell from records review it was never treated.  Orders Placed This Encounter  Procedures  . EKG 12-Lead   No orders of the defined types were placed in this encounter.    Junious Silk, MD, [facility] [phone] office [phone] pager

## 2014-09-19 NOTE — Telephone Encounter (Signed)
Message copied by Vita BarleyLASSITER, Hideo Googe A on Tue Sep 19, 2014  5:20 PM ------      Message from: Thurmon FairROITORU, MIHAI      Created: Tue Sep 19, 2014  9:23 AM       Needs a TSH and LFTs in December please ------

## 2014-09-28 ENCOUNTER — Encounter: Payer: Self-pay | Admitting: Internal Medicine

## 2014-09-28 ENCOUNTER — Other Ambulatory Visit: Payer: Self-pay | Admitting: *Deleted

## 2014-09-28 MED ORDER — AMIODARONE HCL 200 MG PO TABS: 200.0000 mg | ORAL_TABLET | Freq: Every day | ORAL | Status: DC

## 2014-09-28 NOTE — Telephone Encounter (Signed)
Medication electronically refilled. 

## 2014-10-02 ENCOUNTER — Other Ambulatory Visit: Payer: Self-pay

## 2014-10-02 MED ORDER — CARVEDILOL 12.5 MG PO TABS: 12.5000 mg | ORAL_TABLET | Freq: Two times a day (BID) | ORAL | Status: DC

## 2014-11-30 ENCOUNTER — Encounter (HOSPITAL_COMMUNITY): Payer: Self-pay | Admitting: Internal Medicine

## 2014-12-21 ENCOUNTER — Telehealth: Payer: Self-pay | Admitting: Cardiology

## 2014-12-21 ENCOUNTER — Encounter: Payer: Medicare Other | Admitting: *Deleted

## 2014-12-21 NOTE — Telephone Encounter (Signed)
LMOVM reminding pt to send remote transmission.   

## 2014-12-26 ENCOUNTER — Encounter: Payer: Self-pay | Admitting: Cardiology

## 2015-01-20 ENCOUNTER — Other Ambulatory Visit: Payer: Self-pay | Admitting: Internal Medicine

## 2015-01-22 NOTE — Telephone Encounter (Signed)
Rx refill sent to patient pharmacy   

## 2015-03-22 ENCOUNTER — Encounter: Payer: Self-pay | Admitting: *Deleted

## 2015-04-17 ENCOUNTER — Encounter: Payer: Self-pay | Admitting: *Deleted

## 2015-04-25 ENCOUNTER — Other Ambulatory Visit: Payer: Self-pay | Admitting: Cardiovascular Disease

## 2015-05-03 ENCOUNTER — Encounter: Payer: Self-pay | Admitting: Cardiovascular Disease

## 2015-05-03 ENCOUNTER — Ambulatory Visit (INDEPENDENT_AMBULATORY_CARE_PROVIDER_SITE_OTHER): Payer: Medicare Other | Admitting: Cardiovascular Disease

## 2015-05-03 VITALS — BP 118/74 | HR 71 | Ht 73.0 in | Wt 186.2 lb

## 2015-05-03 DIAGNOSIS — I480 Paroxysmal atrial fibrillation: Secondary | ICD-10-CM | POA: Diagnosis not present

## 2015-05-03 DIAGNOSIS — I739 Peripheral vascular disease, unspecified: Secondary | ICD-10-CM

## 2015-05-03 DIAGNOSIS — I5042 Chronic combined systolic (congestive) and diastolic (congestive) heart failure: Secondary | ICD-10-CM | POA: Diagnosis not present

## 2015-05-03 DIAGNOSIS — I10 Essential (primary) hypertension: Secondary | ICD-10-CM

## 2015-05-03 DIAGNOSIS — Z953 Presence of xenogenic heart valve: Secondary | ICD-10-CM

## 2015-05-03 DIAGNOSIS — R079 Chest pain, unspecified: Secondary | ICD-10-CM | POA: Diagnosis not present

## 2015-05-03 DIAGNOSIS — Z951 Presence of aortocoronary bypass graft: Secondary | ICD-10-CM

## 2015-05-03 DIAGNOSIS — E785 Hyperlipidemia, unspecified: Secondary | ICD-10-CM

## 2015-05-03 DIAGNOSIS — I255 Ischemic cardiomyopathy: Secondary | ICD-10-CM | POA: Diagnosis not present

## 2015-05-03 DIAGNOSIS — Z95810 Presence of automatic (implantable) cardiac defibrillator: Secondary | ICD-10-CM

## 2015-05-03 DIAGNOSIS — I25718 Atherosclerosis of autologous vein coronary artery bypass graft(s) with other forms of angina pectoris: Secondary | ICD-10-CM

## 2015-05-03 NOTE — Progress Notes (Signed)
Patient ID: [name], male   DOB: [birth year], 74 y.o.   MRN: [id number]      Cardiology Office Note   Date:  05/04/2015   ID:  [name], DOB [birth year], MRN [id number]  PCP:  Fabio NeighborsFry, Rebecca Ann  Cardiologist:   Thurmon FairROITORU,Jeneane Pieczynski, MD   Chief Complaint  Patient presents with  . Follow-up    patient reports some chest pain-r/t to indigestion, take a GasX and has relief, dizziness and shortness of breath      History of Present Illness: Donald AmasDonald R Steppe is a 74 y.o. 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 He has occasional "indigestion" relieved by Gas-X, not related to physical activity. He is limited by knee pain rather than shortness of breath. He denies lower extremity edema  Interrogation of his defibrillator shows normal device function. He has not had any atrial fibrillation in the last 12 months, but has rare episodes of maximum 9 minutes of regular PAT. The device has not recorded VT or VF. The Optivol is normal. He has 100% biventricular pacing and only 0.1% atrial pacing.  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 He is very uncertain about his medications. He has stopped "some of them", but cannot tell me which ones. He definitely stopped zetia due to cost, but is pretty sure he is taking furosemide, potassium and aspirin  He underwent his initial CABG in 1992, redo bypass in 2004 for occluded grafts. Cardiac catheterization in 2010 showed patent LIMA to LAD, free RIMA to OM, SVG to ramus, 80% stenosis in a small PLA branch with inferior hypokinesis.  The catheterization was done just prior to aortic valve replacement for stenosis with a biological prosthesis ([place] 29 mm) in 2010. He had problems with congestive heart failure requiring inotropic support not long after that surgery, but has subsequently had substantial functional improvement.  Left ventricular systolic  function was severely depressed at one point at 20-25%. His most recent echocardiogram in April 2014 showed an ejection fraction of 40-45% with inferior wall motion abnormalities. He had implantation of a CRT-D device in 2008 (LBBB), system removal for indolent infection in July 2013 with reimplantation in August 2013 ([employer] [place], Dr. Ladona Ridgelaylor), history of atrial flutter status post ablation, sinus node dysfunction, paroxysmal atrial fibrillation. Atrial fibrillation has not been detected in a very long time. He has a remote history of gastrointestinal bleeding. He is not receiving anti-coagulation, but does take aspirin.  Has a history of previous right and left carotid endarterectomy. Carotid duplex ultrasonography in April of 2014 showed minor plaque bilaterally in the carotids and an occluded right vertebral artery (chronic finding). 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 There was a plan for percutaneous coiling of this aneurysm. As far as I can tell from chart review, this was never performed due to his other health problems. There is also mention of significant right renal artery stenosis, but no recent evaluation has been necessary.  He has a history of spotty compliance with medications, particularly statin therapy as well as chronic alcohol use. Other medical problems include remote history of gouty arthritis. He is very active physically. This is confirmed by interrogation of his defibrillator that consistently show 6.6 hours of physical activity on a daily basis.    Past Medical History  Diagnosis Date  .  Chest pain   . Hypertension   . Hyperlipidemia   . 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 H/O atrial flutter     ablation in sinus rhythm   . GERD (gastroesophageal reflux disease)     with gas and bloating probably cause of chest pain  . Ischemic cardiomyopathy      H/O with EF at this cath of 45%  . H/O: gout   . 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 Myocardial infarction   . Anginal pain   . Dysrhythmia   . Anxiety   . Shortness of breath   . Pacemaker   . CHF (congestive heart failure)   . Arthritis     gout    Past Surgical History  Procedure Laterality Date  . Coronary artery bypass graft  1993    redo in 2000, he has had multiple MI previous to both procedures.  . Cardiac catheterization  Dec 18, 2003    with a presentation of atrial flutter with rapid ventricular response. He had a cath and two stents to his PDA after his SVG to his RCA. His other graft were patent. He had LV with EF of 30%.   . Carotid endarterectomy      left in December 2003 and know renal artery stenosis of 60-70%  . Valve replacement  2010  . Implantable cardioverter defibrillator (icd) generator change N/A 06/25/2012    Procedure: ICD GENERATOR CHANGE;  Surgeon: Marinus Maw, MD;  Location: [facility] C[facility] LAB;  Service: Cardiovascular;  Laterality: N/A;  . Bi-ventricular implantable cardioverter defibrillator Right 08/18/2012    Procedure: BI-VENTRICULAR IMPLANTABLE CARDIOVERTER DEFIBRILLATOR  (CRT-D);  Surgeon: Marinus Maw, MD;  Location: [facility] CATH LAB;  Service: Cardiovascular;  Laterality: Right;     Current Outpatient Prescriptions  Medication Sig Dispense Refill  . aspirin EC 81 MG tablet Take by mouth daily.     . carvedilol (COREG) 12.5 MG tablet Take 1 tablet (12.5 mg total) by mouth 2 (two) times daily with a meal. 60 tablet 6  . Cyanocobalamin (VITAMIN B-12 PO) Take 1 tablet by mouth daily.    . enalapril (VASOTEC) 10 MG tablet Take 10 mg by mouth daily.    [name] ezetimibe (ZETIA) 10 MG tablet Take 1 tablet (10 mg total) by mouth daily. 30 tablet 5  . furosemide (LASIX) 40 MG tablet TAKE TWO TABLETS BY MOUTH DAILY 60 tablet 6  . LORazepam (ATIVAN) 0.5 MG tablet Take 0.5 mg by mouth daily.     . magnesium oxide  (MAG-OX) 400 MG tablet Take 400 mg by mouth 2 (two) times daily.    . megestrol (MEGACE) 40 MG/ML suspension Take 600 mg by mouth daily.    . nitroGLYCERIN (NITROSTAT) 0.4 MG SL tablet Place 1 tablet (0.4 mg total) under the tongue every 5 (five) minutes as needed. Chest pain 25 tablet 3  . PACERONE 200 MG tablet TAKE ONE TABLET BY MOUTH ONE TIME DAILY 30 tablet 4  . potassium chloride SA (K-DUR,KLOR-CON) 20 MEQ tablet TAKE ONE TABLET BY MOUTH ONE TIME DAILY 30 tablet 7  . spironolactone (ALDACTONE) 25 MG tablet Take 1 tablet (25 mg total) by mouth daily. 30 tablet 9   No current facility-administered medications for this visit.    Allergies:   Atorvastatin    Social History:  The patient  reports that he has never smoked. His smokeless tobacco use includes  Chew. He reports that he drinks about 8.4 oz of alcohol per week. He reports that he does not use illicit drugs.     ROS:  Please see the history of present illness.    Otherwise, review of systems positive for none.   All other systems are reviewed and negative.    PHYSICAL EXAM: VS:  BP 118/74 mmHg  Pulse 71  Ht 6\' 1"  (1.854 m)  Wt 186 lb 3.2 oz (84.46 kg)  BMI 24.57 kg/m2 , BMI Body mass index is 24.57 kg/(m^2).  General: Alert, oriented x3, no distress  Head: no evidence of trauma, PERRL, EOMI, no exophtalmos or lid lag, no myxedema, no xanthelasma; normal ears, nose and oropharynx  Neck: normal jugular venous pulsations and no hepatojugular reflux; brisk carotid pulses without delay and no carotid bruits  Chest: clear to auscultation, no signs of consolidation by percussion or palpation, normal fremitus, symmetrical and full respiratory excursions; bilateral subclavian scars of defibrillator pocket, current device on the right side, sternotomy scar, all well-healed  Cardiovascular: Inferolateral displacement of the apical impulse, regular rhythm, normal first and paradoxical splitting of the second heart sounds, early  peaking grade 1-2/6 aortic ejection murmurs, rubs or gallops  Abdomen: no tenderness or distention, no masses by palpation, no abnormal pulsatility or arterial bruits, normal bowel sounds, no hepatosplenomegaly  Extremities: no clubbing, cyanosis or edema; 2+ radial, ulnar and brachial pulses bilaterally; 2+ right femoral, posterior tibial and dorsalis pedis pulses; 2+ left femoral, posterior tibial and dorsalis pedis pulses; no subclavian or femoral bruits  Neurological: grossly nonfocal l Psych: euthymic mood, full affect   EKG:  EKG is not ordered today.    Recent Labs: 06/02/2014: ALT 41; BUN 15; Creatinine 1.39*; Potassium 4.1; Sodium 130*    Lipid Panel    Component Value Date/Time   CHOL 281* 06/02/2014 1154   TRIG 163* 06/02/2014 1154   HDL 64 06/02/2014 1154   CHOLHDL 4.4 06/02/2014 1154   VLDL 33 06/02/2014 1154   LDLCALC 184* 06/02/2014 1154      Wt Readings from Last 3 Encounters:  05/03/15 186 lb 3.2 oz (84.46 kg)  09/19/14 196 lb (88.905 kg)  06/02/14 182 lb 11.2 oz (82.872 kg)       ASSESSMENT AND PLAN:  CAD s/p redo CABG  Currently asymptomatic 15 years status post second bypass procedure, all grafts patent by cardiac catheterization 2010.  ATRIAL FIBRILLATION  Remote diagnosis, no recent episodes of arrhythmia recorded by his CRT-D device. Not on anticoagulation. Note remote history of gastrointestinal bleeding. No history of stroke or TIA that I'm aware of.  Needs twice yearly liver function tests and thyroid function tests while taking amiodarone (is he still taking it?) . This medicine was started in August of 2010. He was on Multaq before that.   CARDIOMYOPATHY, ISCHEMIC  LVEF most recently estimated to be 40-45%, NYHA functional class II, clinically euvolemic. Appropriate treatment with ACE inhibitors and beta blockers   CRT-D [employer]  Normal device function. Note history of indolent infection of previous left-sided system that has been  removed. No reprogramming changes necessary.   COMBINED HEART FAILURE, CHRONIC  Seema euvolemic. Again, I recommended that he start weighing himself on a daily basis, which he has not been doing.  H/O aortic valve replacement with porcine valve  Prosthetic valve function is normal by echocardiogram 2014   HYPERTENSION  Well controlled   HYPERLIPIDEMIA  Lipids were elevated in June. 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   Anterior communicating artery aneurysm  4.5 mm aneurysm was discovered incidentally at the time of preoperative carotid angiography. Plans for percutaneous treatment of this aneurysm were in place, but as far as I can tell from records review it was never treated.    Current medicines are reviewed at length with the patient today.  The patient does not have concerns regarding medicines.  The following changes have been made:  no change  Labs/ tests ordered today include:  Orders Placed This Encounter  Procedures  . EKG 12-Lead   Patient Instructions  Remote monitoring is used to monitor your Pacemaker or ICD from home. This monitoring reduces the number of office visits required to check your device to one time per year. It allows [place] to monitor the functioning of your device to ensure it is working properly. You are scheduled for a device check from home on August 03, 2015. You may send your transmission at any time that day. If you have a wireless device, the transmission will be sent automatically. After your physician reviews your transmission, you will receive a postcard with your next transmission date.  Dr. Royann Shivers recommends that you schedule a follow-up appointment in: ONE YEAR   PLEASE CALL OUR OFFICE [phone] WITH A LIST OF THE MEDICATIONS YOU ARE TAKING.    Joie Bimler, MD    05/04/2015 9:35 PM    Thurmon Fair, MD, [facility] [phone] office [phone] pager

## 2015-05-03 NOTE — Patient Instructions (Addendum)
Remote monitoring is used to monitor your Pacemaker or ICD from home. This monitoring reduces the number of office visits required to check your device to one time per year. It allows us to monitor the functioning of your device to ensure it is working properly. You are scheduled for a device check from home on August 03, 2015. You may send your transmission at any time that day. If you have a wireless device, the transmission will be sent automatically. After your physician reviews your transmission, you will receive a postcard with your next transmission date.  Dr. Royann Shiversroitoru recommends that you schedule a follow-up appointment in: ONE YEAR   PLEASE CALL OUR OFFICE [phone] WITH A LIST OF THE MEDICATIONS YOU ARE TAKING.

## 2015-05-07 LAB — CUP PACEART INCLINIC DEVICE CHECK
Battery Voltage: 3.08 V
Brady Statistic AP VP Percent: 34.33 %
Brady Statistic AS VP Percent: 65.52 %
Brady Statistic RA Percent Paced: 34.36 %
Date Time Interrogation Session: 20160512184153
HIGH POWER IMPEDANCE MEASURED VALUE: 86 Ohm
HighPow Impedance: 399 Ohm
Lead Channel Impedance Value: 342 Ohm
Lead Channel Impedance Value: 494 Ohm
Lead Channel Pacing Threshold Amplitude: 0.75 V
Lead Channel Pacing Threshold Amplitude: 1.25 V
Lead Channel Pacing Threshold Pulse Width: 0.4 ms
Lead Channel Pacing Threshold Pulse Width: 0.4 ms
Lead Channel Sensing Intrinsic Amplitude: 7.25 mV
Lead Channel Setting Pacing Amplitude: 2 V
Lead Channel Setting Pacing Amplitude: 2 V
Lead Channel Setting Pacing Pulse Width: 0.4 ms
MDC IDC MSMT LEADCHNL LV IMPEDANCE VALUE: 665 Ohm
MDC IDC MSMT LEADCHNL LV IMPEDANCE VALUE: 893 Ohm
MDC IDC MSMT LEADCHNL RA IMPEDANCE VALUE: 513 Ohm
MDC IDC MSMT LEADCHNL RA PACING THRESHOLD AMPLITUDE: 1 V
MDC IDC MSMT LEADCHNL RA SENSING INTR AMPL: 1.5 mV
MDC IDC MSMT LEADCHNL RA SENSING INTR AMPL: 1.5 mV
MDC IDC MSMT LEADCHNL RV PACING THRESHOLD PULSEWIDTH: 0.4 ms
MDC IDC MSMT LEADCHNL RV SENSING INTR AMPL: 7.25 mV
MDC IDC SET LEADCHNL LV PACING AMPLITUDE: 2.25 V
MDC IDC SET LEADCHNL LV PACING PULSEWIDTH: 0.4 ms
MDC IDC SET LEADCHNL RV SENSING SENSITIVITY: 0.3 mV
MDC IDC SET ZONE DETECTION INTERVAL: 300 ms
MDC IDC SET ZONE DETECTION INTERVAL: 350 ms
MDC IDC STAT BRADY AP VS PERCENT: 0.03 %
MDC IDC STAT BRADY AS VS PERCENT: 0.12 %
MDC IDC STAT BRADY RV PERCENT PACED: 99.85 %
Zone Setting Detection Interval: 360 ms
Zone Setting Detection Interval: 360 ms

## 2015-05-30 ENCOUNTER — Encounter: Payer: Self-pay | Admitting: Cardiovascular Disease

## 2015-06-17 ENCOUNTER — Other Ambulatory Visit: Payer: Self-pay | Admitting: Internal Medicine

## 2015-06-18 ENCOUNTER — Other Ambulatory Visit: Payer: Self-pay

## 2015-06-18 MED ORDER — FUROSEMIDE 40 MG PO TABS: 80.0000 mg | ORAL_TABLET | Freq: Every day | ORAL | Status: DC

## 2015-06-22 ENCOUNTER — Other Ambulatory Visit: Payer: Self-pay

## 2015-06-22 MED ORDER — FUROSEMIDE 40 MG PO TABS: 80.0000 mg | ORAL_TABLET | Freq: Every day | ORAL | Status: DC

## 2015-08-03 ENCOUNTER — Encounter: Payer: Medicare Other | Admitting: *Deleted

## 2015-08-03 ENCOUNTER — Telehealth: Payer: Self-pay | Admitting: *Deleted

## 2015-08-03 NOTE — Telephone Encounter (Signed)
LMVOM stating automatic remote did not send. Requested pt send a manual transmission. Left device clinic #. I stated no emergency, pt may send anytime he returns home.

## 2015-08-07 ENCOUNTER — Encounter: Payer: Self-pay | Admitting: Cardiology

## 2015-08-17 ENCOUNTER — Ambulatory Visit (INDEPENDENT_AMBULATORY_CARE_PROVIDER_SITE_OTHER): Payer: Medicare Other | Admitting: *Deleted

## 2015-08-17 DIAGNOSIS — I255 Ischemic cardiomyopathy: Secondary | ICD-10-CM | POA: Diagnosis not present

## 2015-08-21 NOTE — Progress Notes (Signed)
Remote ICD transmission.   

## 2015-09-03 LAB — CUP PACEART REMOTE DEVICE CHECK
Battery Voltage: 3.05 V
Brady Statistic AP VP Percent: 36.84 %
Brady Statistic RV Percent Paced: 99.96 %
Date Time Interrogation Session: 20160826201221
HighPow Impedance: 342 Ohm
HighPow Impedance: 86 Ohm
Lead Channel Impedance Value: 513 Ohm
Lead Channel Impedance Value: 817 Ohm
Lead Channel Pacing Threshold Amplitude: 0.75 V
Lead Channel Pacing Threshold Pulse Width: 0.4 ms
Lead Channel Pacing Threshold Pulse Width: 0.4 ms
Lead Channel Sensing Intrinsic Amplitude: 1.625 mV
Lead Channel Sensing Intrinsic Amplitude: 1.625 mV
Lead Channel Sensing Intrinsic Amplitude: 16 mV
Lead Channel Setting Pacing Amplitude: 2.25 V
Lead Channel Setting Pacing Amplitude: 2.25 V
Lead Channel Setting Pacing Pulse Width: 0.4 ms
Lead Channel Setting Pacing Pulse Width: 0.4 ms
Lead Channel Setting Sensing Sensitivity: 0.3 mV
MDC IDC MSMT LEADCHNL LV IMPEDANCE VALUE: 342 Ohm
MDC IDC MSMT LEADCHNL LV IMPEDANCE VALUE: 627 Ohm
MDC IDC MSMT LEADCHNL LV PACING THRESHOLD AMPLITUDE: 1.25 V
MDC IDC MSMT LEADCHNL RA PACING THRESHOLD AMPLITUDE: 1 V
MDC IDC MSMT LEADCHNL RV IMPEDANCE VALUE: 437 Ohm
MDC IDC MSMT LEADCHNL RV PACING THRESHOLD PULSEWIDTH: 0.4 ms
MDC IDC MSMT LEADCHNL RV SENSING INTR AMPL: 16 mV
MDC IDC SET LEADCHNL RV PACING AMPLITUDE: 2 V
MDC IDC SET ZONE DETECTION INTERVAL: 350 ms
MDC IDC SET ZONE DETECTION INTERVAL: 360 ms
MDC IDC STAT BRADY AP VS PERCENT: 0.03 %
MDC IDC STAT BRADY AS VP PERCENT: 63.12 %
MDC IDC STAT BRADY AS VS PERCENT: 0.01 %
MDC IDC STAT BRADY RA PERCENT PACED: 36.87 %
Zone Setting Detection Interval: 300 ms
Zone Setting Detection Interval: 360 ms

## 2015-09-26 ENCOUNTER — Encounter: Payer: Self-pay | Admitting: Cardiology

## 2015-10-09 ENCOUNTER — Encounter: Payer: Self-pay | Admitting: Cardiovascular Disease

## 2015-10-27 ENCOUNTER — Other Ambulatory Visit: Payer: Self-pay | Admitting: Cardiovascular Disease

## 2015-11-19 ENCOUNTER — Ambulatory Visit (INDEPENDENT_AMBULATORY_CARE_PROVIDER_SITE_OTHER): Payer: Medicare Other | Admitting: *Deleted

## 2015-11-19 ENCOUNTER — Telehealth: Payer: Self-pay | Admitting: Cardiology

## 2015-11-19 DIAGNOSIS — I255 Ischemic cardiomyopathy: Secondary | ICD-10-CM | POA: Diagnosis not present

## 2015-11-19 NOTE — Telephone Encounter (Signed)
LMOVM reminding pt to send remote transmission.   

## 2015-11-21 NOTE — Progress Notes (Signed)
Remote ICD transmission.   

## 2015-11-22 ENCOUNTER — Other Ambulatory Visit: Payer: Self-pay | Admitting: Cardiovascular Disease

## 2015-11-22 NOTE — Telephone Encounter (Signed)
REFILL 

## 2015-11-29 LAB — CUP PACEART REMOTE DEVICE CHECK
Battery Voltage: 3.05 V
Brady Statistic AP VS Percent: 0.02 %
Brady Statistic AS VP Percent: 64.57 %
Brady Statistic AS VS Percent: 0.02 %
Brady Statistic RV Percent Paced: 99.96 %
HIGH POWER IMPEDANCE MEASURED VALUE: 72 Ohm
HighPow Impedance: 266 Ohm
Implantable Lead Implant Date: 20130828
Implantable Lead Location: 753858
Implantable Lead Location: 753860
Implantable Lead Model: 4194
Implantable Lead Model: 5076
Implantable Lead Model: 6935
Lead Channel Impedance Value: 323 Ohm
Lead Channel Impedance Value: 494 Ohm
Lead Channel Impedance Value: 589 Ohm
Lead Channel Impedance Value: 760 Ohm
Lead Channel Pacing Threshold Amplitude: 0.875 V
Lead Channel Pacing Threshold Amplitude: 1 V
Lead Channel Pacing Threshold Amplitude: 1 V
Lead Channel Pacing Threshold Pulse Width: 0.4 ms
Lead Channel Sensing Intrinsic Amplitude: 1.125 mV
Lead Channel Sensing Intrinsic Amplitude: 7.5 mV
Lead Channel Setting Pacing Amplitude: 2 V
Lead Channel Setting Pacing Amplitude: 2 V
Lead Channel Setting Pacing Amplitude: 2 V
Lead Channel Setting Pacing Pulse Width: 0.4 ms
Lead Channel Setting Sensing Sensitivity: 0.3 mV
MDC IDC LEAD IMPLANT DT: 20130828
MDC IDC LEAD IMPLANT DT: 20130828
MDC IDC LEAD LOCATION: 753859
MDC IDC MSMT LEADCHNL LV PACING THRESHOLD PULSEWIDTH: 0.4 ms
MDC IDC MSMT LEADCHNL RA SENSING INTR AMPL: 1.125 mV
MDC IDC MSMT LEADCHNL RV IMPEDANCE VALUE: 342 Ohm
MDC IDC MSMT LEADCHNL RV PACING THRESHOLD PULSEWIDTH: 0.4 ms
MDC IDC MSMT LEADCHNL RV SENSING INTR AMPL: 7.5 mV
MDC IDC SESS DTM: 20161129062510
MDC IDC SET LEADCHNL LV PACING PULSEWIDTH: 0.4 ms
MDC IDC STAT BRADY AP VP PERCENT: 35.39 %
MDC IDC STAT BRADY RA PERCENT PACED: 35.41 %

## 2015-11-30 ENCOUNTER — Encounter: Payer: Self-pay | Admitting: Cardiology

## 2015-12-05 NOTE — Progress Notes (Signed)
[facility]

## 2015-12-26 ENCOUNTER — Telehealth: Payer: Self-pay | Admitting: Cardiovascular Disease

## 2015-12-26 NOTE — Telephone Encounter (Signed)
[facility] . Please f/u with pt  Thanks

## 2015-12-26 NOTE — Telephone Encounter (Signed)
No recent encounter found - [facility].

## 2015-12-27 NOTE — Telephone Encounter (Signed)
NA 12/27/15:  Calling patient to see how he is feeling.  Any complaints of SOB or edema.  Last device indicated increased fluid.

## 2015-12-27 NOTE — Progress Notes (Signed)
NA 12/27/15:  Calling patient to see how he is feeling.  Any complaints of SOB or edema.  Last device indicated increased fluid. 

## 2015-12-27 NOTE — Telephone Encounter (Signed)
Patient returning call.  States he feels fine.  States he will always be SOB but no increased SOB or edema.  Reminded of next transmission and return appt some time in May 2017.  Patient voiced understanding.

## 2016-01-20 ENCOUNTER — Encounter (HOSPITAL_COMMUNITY): Payer: Self-pay | Admitting: Emergency Medicine

## 2016-01-20 ENCOUNTER — Emergency Department (HOSPITAL_COMMUNITY)
Admission: EM | Admit: 2016-01-20 | Discharge: 2016-01-21 | Disposition: A | Discharge status: Home / Self Care | Payer: Medicare Other | Attending: Emergency Medicine | Admitting: Emergency Medicine

## 2016-01-20 DIAGNOSIS — Z79899 Other long term (current) drug therapy: Secondary | ICD-10-CM | POA: Insufficient documentation

## 2016-01-20 DIAGNOSIS — Z950 Presence of cardiac pacemaker: Secondary | ICD-10-CM | POA: Insufficient documentation

## 2016-01-20 DIAGNOSIS — E785 Hyperlipidemia, unspecified: Secondary | ICD-10-CM | POA: Diagnosis not present

## 2016-01-20 DIAGNOSIS — D649 Anemia, unspecified: Secondary | ICD-10-CM | POA: Insufficient documentation

## 2016-01-20 DIAGNOSIS — Z951 Presence of aortocoronary bypass graft: Secondary | ICD-10-CM | POA: Insufficient documentation

## 2016-01-20 DIAGNOSIS — Z046 Encounter for general psychiatric examination, requested by authority: Secondary | ICD-10-CM | POA: Diagnosis present

## 2016-01-20 DIAGNOSIS — I4892 Unspecified atrial flutter: Secondary | ICD-10-CM | POA: Diagnosis not present

## 2016-01-20 DIAGNOSIS — Z8719 Personal history of other diseases of the digestive system: Secondary | ICD-10-CM | POA: Diagnosis not present

## 2016-01-20 DIAGNOSIS — I252 Old myocardial infarction: Secondary | ICD-10-CM | POA: Diagnosis not present

## 2016-01-20 DIAGNOSIS — Z9889 Other specified postprocedural states: Secondary | ICD-10-CM | POA: Diagnosis not present

## 2016-01-20 DIAGNOSIS — M1990 Unspecified osteoarthritis, unspecified site: Secondary | ICD-10-CM | POA: Insufficient documentation

## 2016-01-20 DIAGNOSIS — N289 Disorder of kidney and ureter, unspecified: Secondary | ICD-10-CM | POA: Insufficient documentation

## 2016-01-20 DIAGNOSIS — Z7982 Long term (current) use of aspirin: Secondary | ICD-10-CM | POA: Diagnosis not present

## 2016-01-20 DIAGNOSIS — F419 Anxiety disorder, unspecified: Secondary | ICD-10-CM | POA: Insufficient documentation

## 2016-01-20 DIAGNOSIS — F10229 Alcohol dependence with intoxication, unspecified: Secondary | ICD-10-CM | POA: Diagnosis not present

## 2016-01-20 DIAGNOSIS — F10920 Alcohol use, unspecified with intoxication, uncomplicated: Secondary | ICD-10-CM

## 2016-01-20 NOTE — ED Notes (Signed)
telepsych completed ,
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
## 2016-01-21 LAB — POC OCCULT BLOOD, ED: FECAL OCCULT BLD: NEGATIVE

## 2016-01-21 LAB — COMPREHENSIVE METABOLIC PANEL
ALK PHOS: 55 U/L (ref 38–126)
ALT: 33 U/L (ref 17–63)
AST: 51 U/L — AB (ref 15–41)
Albumin: 3.3 g/dL — ABNORMAL LOW (ref 3.5–5.0)
Anion gap: 15 (ref 5–15)
BILIRUBIN TOTAL: 0.5 mg/dL (ref 0.3–1.2)
BUN: 21 mg/dL — AB (ref 6–20)
CALCIUM: 8.9 mg/dL (ref 8.9–10.3)
CHLORIDE: 100 mmol/L — AB (ref 101–111)
CO2: 23 mmol/L (ref 22–32)
CREATININE: 2.01 mg/dL — AB (ref 0.61–1.24)
GFR calc Af Amer: 36 mL/min — ABNORMAL LOW (ref >60)
GFR, EST NON AFRICAN AMERICAN: 31 mL/min — AB (ref >60)
Glucose, Bld: 123 mg/dL — ABNORMAL HIGH (ref 65–99)
Potassium: 3.1 mmol/L — ABNORMAL LOW (ref 3.5–5.1)
Sodium: 138 mmol/L (ref 135–145)
TOTAL PROTEIN: 7.4 g/dL (ref 6.5–8.1)

## 2016-01-21 LAB — CBC WITH DIFFERENTIAL/PLATELET
BASOS ABS: 0 10*3/uL (ref 0.0–0.1)
Basophils Relative: 1 %
EOS ABS: 0.1 10*3/uL (ref 0.0–0.7)
EOS PCT: 1 %
HCT: 28.2 % — ABNORMAL LOW (ref 39.0–52.0)
Hemoglobin: 8.8 g/dL — ABNORMAL LOW (ref 13.0–17.0)
LYMPHS PCT: 17 %
Lymphs Abs: 1 10*3/uL (ref 0.7–4.0)
MCH: 24.9 pg — ABNORMAL LOW (ref 26.0–34.0)
MCHC: 31.2 g/dL (ref 30.0–36.0)
MCV: 79.9 fL (ref 78.0–100.0)
MONO ABS: 0.9 10*3/uL (ref 0.1–1.0)
Monocytes Relative: 15 %
Neutro Abs: 4.1 10*3/uL (ref 1.7–7.7)
Neutrophils Relative %: 66 %
PLATELETS: 292 10*3/uL (ref 150–400)
RBC: 3.53 MIL/uL — ABNORMAL LOW (ref 4.22–5.81)
RDW: 16.4 % — AB (ref 11.5–15.5)
WBC: 6.2 10*3/uL (ref 4.0–10.5)

## 2016-01-21 LAB — IRON AND TIBC
Iron: 14 ug/dL — ABNORMAL LOW (ref 45–182)
Saturation Ratios: 3 % — ABNORMAL LOW (ref 17.9–39.5)
TIBC: 413 ug/dL (ref 250–450)
UIBC: 399 ug/dL

## 2016-01-21 LAB — RETICULOCYTES
RBC.: 3.31 MIL/uL — AB (ref 4.22–5.81)
RETIC COUNT ABSOLUTE: 72.8 10*3/uL (ref 19.0–186.0)
Retic Ct Pct: 2.2 % (ref 0.4–3.1)

## 2016-01-21 LAB — RAPID URINE DRUG SCREEN, HOSP PERFORMED
Amphetamines: NOT DETECTED
BARBITURATES: NOT DETECTED
BENZODIAZEPINES: NOT DETECTED
Cocaine: NOT DETECTED
Opiates: NOT DETECTED
Tetrahydrocannabinol: NOT DETECTED

## 2016-01-21 LAB — VITAMIN B12: Vitamin B-12: 252 pg/mL (ref 180–914)

## 2016-01-21 LAB — FERRITIN: Ferritin: 33 ng/mL (ref 24–336)

## 2016-01-21 LAB — FOLATE: FOLATE: 11.1 ng/mL (ref >5.9)

## 2016-01-21 LAB — ETHANOL: ALCOHOL ETHYL (B): 134 mg/dL — AB (ref <5)

## 2016-01-21 MED ORDER — POTASSIUM CHLORIDE CRYS ER 20 MEQ PO TBCR
40.0000 meq | EXTENDED_RELEASE_TABLET | Freq: Once | ORAL | Status: AC
  Administered 2016-01-21: 40 meq via ORAL
  Filled 2016-01-21: qty 2

## 2016-01-21 NOTE — Discharge Instructions (Signed)
You have an anemia. I have sent blood work to help differentiate why you are anemic. You should get a gastroenterology evaluation for your black stools. You may have an inflammation of your stomach causing some bleeding which will make your stools look black. If they don't feel that is the cause of your anemia, you can be evaluated by Dr Galen Manila, who is a hematologist (blood specialist).  You are also having some kidney problems. You can see Dr Kristian Covey, a kidney specialist to get more testing for that.  Return to the ED if you get abdominal pain, feel weak, dizzy, have black bowel movements, chest pain, shortness of breath or you feel worse. Consider cutting back on the alcohol.     Emergency Department Resource Guide   Self-Help/Support Groups Organization         Address  Phone             Notes  [facility] [place] - variety of support groups  336- [phone] Call for more information  Narcotics Anonymous (NA), [facility] [address], [employer]  2 meetings at this location   Statistician         Address  Phone  Notes  [facility] [address],    [place]  [phone]   [facility]  [address], [place] [address], [place], [place] [phone]   [facility] [address] [place], [place] [phone] Admissions: 8am-3pm M-F  [facility] [address],    [place], [place] [phone]   [facility] [address] [place], [place], [place] [phone]   [facility] [address],  [place], [place] [phone]   [facility] - Intensive Outpatient [address], [place], [place] [phone]   [facility] ([facility] [address]  [place], [place] [phone] or [phone]   [facility] [address], [place], [place] [phone] Accepts Medicaid  [facility] [address]  [place] [phone] Substance Abuse/Addiction Treatment   [facility] Organization         Address  Phone  Notes  [facility]  [phone]   Angie Fava, PhD [address] [place] [place], [place]   [phone] or [phone]   [facility]   [address] [place], [place] [phone]   [facility] [address] [place], [place] [phone] Insurance/Medicaid/sponsorship through [facility] and Families [address]                                    [place], [place] [phone] Therapy/tele-psych/case  [facility] [address], [place] [phone]    Dr. Lolly Mustache  [phone]   [facility]  [employer] [facility] Dept. 1) [address] [place] 2) [address] [place] 3)  [address], [place] [phone]  [phone]   [facility] Child Abuse Hotline ([phone] or [phone] (After Hours)

## 2016-01-21 NOTE — BH Assessment (Signed)
Informed Donald Fess, NP that labs are completed. She says that while Pt would benefit from alcohol treatment he does not meet criteria for IVC. Notified Dr. [name] of recommendation.  Harlin Rain Patsy [place], LPC, [facility], [facility] Triage Specialist [phone]

## 2016-01-21 NOTE — ED Notes (Signed)
Pt updated on plan of care, sitter remains at bedside,  

## 2016-01-21 NOTE — ED Notes (Signed)
Dr Lynelle Doctor at bedside, pt reports that he has "dark stools" at times, last time was about a week or so ago,
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
## 2016-01-21 NOTE — ED Notes (Signed)
[facility]) called, requesting pt's etoh level, information given to [facility] who advised that he would consult with NP about pt,

## 2016-01-29 ENCOUNTER — Emergency Department (HOSPITAL_COMMUNITY): Payer: Medicare Other

## 2016-01-29 ENCOUNTER — Inpatient Hospital Stay (HOSPITAL_COMMUNITY)
Admission: EM | Admit: 2016-01-29 | Discharge: 2016-02-02 | DRG: 897 | Disposition: A | Discharge status: Skilled Nursing Facility | Payer: Medicare Other | Attending: Internal Medicine | Admitting: Internal Medicine

## 2016-01-29 ENCOUNTER — Encounter (HOSPITAL_COMMUNITY): Payer: Self-pay

## 2016-01-29 DIAGNOSIS — R531 Weakness: Secondary | ICD-10-CM | POA: Diagnosis not present

## 2016-01-29 DIAGNOSIS — I480 Paroxysmal atrial fibrillation: Secondary | ICD-10-CM | POA: Insufficient documentation

## 2016-01-29 DIAGNOSIS — W19XXXA Unspecified fall, initial encounter: Secondary | ICD-10-CM | POA: Diagnosis present

## 2016-01-29 DIAGNOSIS — M542 Cervicalgia: Secondary | ICD-10-CM | POA: Diagnosis not present

## 2016-01-29 DIAGNOSIS — F419 Anxiety disorder, unspecified: Secondary | ICD-10-CM | POA: Diagnosis present

## 2016-01-29 DIAGNOSIS — F1020 Alcohol dependence, uncomplicated: Secondary | ICD-10-CM

## 2016-01-29 DIAGNOSIS — Z952 Presence of prosthetic heart valve: Secondary | ICD-10-CM

## 2016-01-29 DIAGNOSIS — I5042 Chronic combined systolic (congestive) and diastolic (congestive) heart failure: Secondary | ICD-10-CM | POA: Diagnosis present

## 2016-01-29 DIAGNOSIS — I252 Old myocardial infarction: Secondary | ICD-10-CM

## 2016-01-29 DIAGNOSIS — K219 Gastro-esophageal reflux disease without esophagitis: Secondary | ICD-10-CM | POA: Diagnosis present

## 2016-01-29 DIAGNOSIS — F10239 Alcohol dependence with withdrawal, unspecified: Secondary | ICD-10-CM | POA: Diagnosis not present

## 2016-01-29 DIAGNOSIS — I2510 Atherosclerotic heart disease of native coronary artery without angina pectoris: Secondary | ICD-10-CM | POA: Diagnosis present

## 2016-01-29 DIAGNOSIS — D638 Anemia in other chronic diseases classified elsewhere: Secondary | ICD-10-CM | POA: Diagnosis present

## 2016-01-29 DIAGNOSIS — Z95810 Presence of automatic (implantable) cardiac defibrillator: Secondary | ICD-10-CM

## 2016-01-29 DIAGNOSIS — I482 Chronic atrial fibrillation: Secondary | ICD-10-CM | POA: Diagnosis present

## 2016-01-29 DIAGNOSIS — S42002A Fracture of unspecified part of left clavicle, initial encounter for closed fracture: Secondary | ICD-10-CM

## 2016-01-29 DIAGNOSIS — I739 Peripheral vascular disease, unspecified: Secondary | ICD-10-CM | POA: Diagnosis present

## 2016-01-29 DIAGNOSIS — E860 Dehydration: Secondary | ICD-10-CM | POA: Diagnosis present

## 2016-01-29 DIAGNOSIS — F10939 Alcohol use, unspecified with withdrawal, unspecified: Secondary | ICD-10-CM | POA: Diagnosis present

## 2016-01-29 DIAGNOSIS — F17220 Nicotine dependence, chewing tobacco, uncomplicated: Secondary | ICD-10-CM | POA: Diagnosis present

## 2016-01-29 DIAGNOSIS — E876 Hypokalemia: Secondary | ICD-10-CM | POA: Diagnosis present

## 2016-01-29 DIAGNOSIS — I110 Hypertensive heart disease with heart failure: Secondary | ICD-10-CM | POA: Diagnosis present

## 2016-01-29 DIAGNOSIS — D519 Vitamin B12 deficiency anemia, unspecified: Secondary | ICD-10-CM | POA: Insufficient documentation

## 2016-01-29 DIAGNOSIS — Z951 Presence of aortocoronary bypass graft: Secondary | ICD-10-CM

## 2016-01-29 DIAGNOSIS — Z7982 Long term (current) use of aspirin: Secondary | ICD-10-CM

## 2016-01-29 DIAGNOSIS — Z79899 Other long term (current) drug therapy: Secondary | ICD-10-CM

## 2016-01-29 DIAGNOSIS — S42009A Fracture of unspecified part of unspecified clavicle, initial encounter for closed fracture: Secondary | ICD-10-CM

## 2016-01-29 DIAGNOSIS — S42032A Displaced fracture of lateral end of left clavicle, initial encounter for closed fracture: Secondary | ICD-10-CM | POA: Diagnosis present

## 2016-01-29 DIAGNOSIS — E785 Hyperlipidemia, unspecified: Secondary | ICD-10-CM | POA: Diagnosis present

## 2016-01-29 LAB — COMPREHENSIVE METABOLIC PANEL
ALK PHOS: 59 U/L (ref 38–126)
ALT: 34 U/L (ref 17–63)
AST: 52 U/L — AB (ref 15–41)
Albumin: 3.3 g/dL — ABNORMAL LOW (ref 3.5–5.0)
Anion gap: 13 (ref 5–15)
BILIRUBIN TOTAL: 0.6 mg/dL (ref 0.3–1.2)
BUN: 12 mg/dL (ref 6–20)
CALCIUM: 8.5 mg/dL — AB (ref 8.9–10.3)
CHLORIDE: 96 mmol/L — AB (ref 101–111)
CO2: 25 mmol/L (ref 22–32)
CREATININE: 0.98 mg/dL (ref 0.61–1.24)
Glucose, Bld: 96 mg/dL (ref 65–99)
Potassium: 3.1 mmol/L — ABNORMAL LOW (ref 3.5–5.1)
Sodium: 134 mmol/L — ABNORMAL LOW (ref 135–145)
Total Protein: 7.4 g/dL (ref 6.5–8.1)

## 2016-01-29 LAB — RAPID URINE DRUG SCREEN, HOSP PERFORMED
AMPHETAMINES: NOT DETECTED
Barbiturates: NOT DETECTED
Benzodiazepines: NOT DETECTED
Cocaine: NOT DETECTED
OPIATES: NOT DETECTED
TETRAHYDROCANNABINOL: NOT DETECTED

## 2016-01-29 LAB — CBC WITH DIFFERENTIAL/PLATELET
BASOS PCT: 1 %
Basophils Absolute: 0.1 10*3/uL (ref 0.0–0.1)
EOS ABS: 0 10*3/uL (ref 0.0–0.7)
Eosinophils Relative: 0 %
HEMATOCRIT: 27.6 % — AB (ref 39.0–52.0)
HEMOGLOBIN: 8.7 g/dL — AB (ref 13.0–17.0)
Lymphocytes Relative: 13 %
Lymphs Abs: 1 10*3/uL (ref 0.7–4.0)
MCH: 24.4 pg — ABNORMAL LOW (ref 26.0–34.0)
MCHC: 31.5 g/dL (ref 30.0–36.0)
MCV: 77.3 fL — ABNORMAL LOW (ref 78.0–100.0)
Monocytes Absolute: 1.1 10*3/uL — ABNORMAL HIGH (ref 0.1–1.0)
Monocytes Relative: 15 %
NEUTROS ABS: 5.1 10*3/uL (ref 1.7–7.7)
NEUTROS PCT: 71 %
Platelets: 260 10*3/uL (ref 150–400)
RBC: 3.57 MIL/uL — AB (ref 4.22–5.81)
RDW: 16.5 % — ABNORMAL HIGH (ref 11.5–15.5)
WBC: 7.2 10*3/uL (ref 4.0–10.5)

## 2016-01-29 LAB — ETHANOL: ALCOHOL ETHYL (B): 266 mg/dL — AB (ref <5)

## 2016-01-29 LAB — PROTIME-INR
INR: 1.09 (ref 0.00–1.49)
PROTHROMBIN TIME: 14.3 s (ref 11.6–15.2)

## 2016-01-29 MED ORDER — HYDROMORPHONE HCL 1 MG/ML IJ SOLN
1.0000 mg | INTRAMUSCULAR | Status: DC | PRN
  Administered 2016-01-29 – 2016-01-30 (×2): 1 mg via INTRAVENOUS
  Filled 2016-01-29 (×2): qty 1

## 2016-01-29 MED ORDER — ADULT MULTIVITAMIN W/MINERALS CH
1.0000 | ORAL_TABLET | Freq: Every day | ORAL | Status: DC
  Administered 2016-01-29 – 2016-02-02 (×5): 1 via ORAL
  Filled 2016-01-29 (×6): qty 1

## 2016-01-29 MED ORDER — AMIODARONE HCL 200 MG PO TABS
200.0000 mg | ORAL_TABLET | Freq: Every day | ORAL | Status: DC
  Administered 2016-01-30 – 2016-02-02 (×4): 200 mg via ORAL
  Filled 2016-01-29 (×4): qty 1

## 2016-01-29 MED ORDER — ENALAPRIL MALEATE 5 MG PO TABS
10.0000 mg | ORAL_TABLET | Freq: Every day | ORAL | Status: DC
  Administered 2016-01-30 – 2016-02-02 (×4): 10 mg via ORAL
  Filled 2016-01-29 (×4): qty 2

## 2016-01-29 MED ORDER — CYCLOBENZAPRINE HCL 10 MG PO TABS
10.0000 mg | ORAL_TABLET | Freq: Three times a day (TID) | ORAL | Status: DC
  Administered 2016-01-29 – 2016-01-30 (×3): 10 mg via ORAL
  Filled 2016-01-29 (×3): qty 1

## 2016-01-29 MED ORDER — POTASSIUM CHLORIDE 10 MEQ/100ML IV SOLN
10.0000 meq | INTRAVENOUS | Status: AC
  Administered 2016-01-29 – 2016-01-30 (×3): 10 meq via INTRAVENOUS
  Filled 2016-01-29: qty 100

## 2016-01-29 MED ORDER — ONDANSETRON HCL 4 MG/2ML IJ SOLN: 4.0000 mg | Freq: Four times a day (QID) | INTRAMUSCULAR | Status: DC | PRN

## 2016-01-29 MED ORDER — SODIUM CHLORIDE 0.9% FLUSH
3.0000 mL | INTRAVENOUS | Status: DC | PRN
  Administered 2016-01-31: 1 mL via INTRAVENOUS
  Filled 2016-01-29: qty 3

## 2016-01-29 MED ORDER — ENOXAPARIN SODIUM 40 MG/0.4ML ~~LOC~~ SOLN
40.0000 mg | SUBCUTANEOUS | Status: DC
  Administered 2016-01-29 – 2016-02-01 (×4): 40 mg via SUBCUTANEOUS
  Filled 2016-01-29 (×4): qty 0.4

## 2016-01-29 MED ORDER — THIAMINE HCL 100 MG/ML IJ SOLN
100.0000 mg | Freq: Every day | INTRAMUSCULAR | Status: DC
  Filled 2016-01-29: qty 2

## 2016-01-29 MED ORDER — FOLIC ACID 1 MG PO TABS
1.0000 mg | ORAL_TABLET | Freq: Every day | ORAL | Status: DC
  Administered 2016-01-29 – 2016-02-02 (×5): 1 mg via ORAL
  Filled 2016-01-29 (×6): qty 1

## 2016-01-29 MED ORDER — POTASSIUM CHLORIDE CRYS ER 20 MEQ PO TBCR
20.0000 meq | EXTENDED_RELEASE_TABLET | Freq: Every day | ORAL | Status: DC
  Administered 2016-01-30 – 2016-02-02 (×4): 20 meq via ORAL
  Filled 2016-01-29 (×3): qty 1
  Filled 2016-01-29: qty 2
  Filled 2016-01-29: qty 1

## 2016-01-29 MED ORDER — SODIUM CHLORIDE 0.9% FLUSH
3.0000 mL | Freq: Two times a day (BID) | INTRAVENOUS | Status: DC
  Administered 2016-01-29 – 2016-02-01 (×6): 3 mL via INTRAVENOUS

## 2016-01-29 MED ORDER — SPIRONOLACTONE 25 MG PO TABS
25.0000 mg | ORAL_TABLET | Freq: Every day | ORAL | Status: DC
  Administered 2016-01-30 – 2016-02-02 (×4): 25 mg via ORAL
  Filled 2016-01-29 (×4): qty 1

## 2016-01-29 MED ORDER — MAGNESIUM OXIDE 400 (241.3 MG) MG PO TABS
400.0000 mg | ORAL_TABLET | Freq: Two times a day (BID) | ORAL | Status: DC
  Administered 2016-01-29 – 2016-02-02 (×8): 400 mg via ORAL
  Filled 2016-01-29 (×8): qty 1

## 2016-01-29 MED ORDER — VITAMIN B-1 100 MG PO TABS
100.0000 mg | ORAL_TABLET | Freq: Every day | ORAL | Status: DC
  Administered 2016-01-29 – 2016-02-02 (×5): 100 mg via ORAL
  Filled 2016-01-29 (×5): qty 1

## 2016-01-29 MED ORDER — ONDANSETRON HCL 4 MG PO TABS: 4.0000 mg | ORAL_TABLET | Freq: Four times a day (QID) | ORAL | Status: DC | PRN

## 2016-01-29 MED ORDER — EZETIMIBE 10 MG PO TABS
10.0000 mg | ORAL_TABLET | Freq: Every day | ORAL | Status: DC
  Administered 2016-01-30 – 2016-02-02 (×4): 10 mg via ORAL
  Filled 2016-01-29 (×4): qty 1

## 2016-01-29 MED ORDER — LORAZEPAM 2 MG/ML IJ SOLN
1.0000 mg | Freq: Four times a day (QID) | INTRAMUSCULAR | Status: AC | PRN
  Administered 2016-01-31: 1 mg via INTRAVENOUS
  Filled 2016-01-29: qty 1

## 2016-01-29 MED ORDER — SODIUM CHLORIDE 0.9 % IV SOLN: 250.0000 mL | INTRAVENOUS | Status: DC | PRN

## 2016-01-29 MED ORDER — ASPIRIN EC 81 MG PO TBEC
81.0000 mg | DELAYED_RELEASE_TABLET | Freq: Every day | ORAL | Status: DC
Start: 2016-01-30 — End: 2016-02-02
  Administered 2016-01-30 – 2016-02-02 (×4): 81 mg via ORAL
  Filled 2016-01-29 (×4): qty 1

## 2016-01-29 MED ORDER — MEGESTROL ACETATE 400 MG/10ML PO SUSP
600.0000 mg | Freq: Every day | ORAL | Status: DC
  Administered 2016-01-30 – 2016-02-02 (×4): 600 mg via ORAL
  Filled 2016-01-29: qty 15
  Filled 2016-01-29: qty 20
  Filled 2016-01-29 (×3): qty 15
  Filled 2016-01-29: qty 20
  Filled 2016-01-29 (×2): qty 15

## 2016-01-29 MED ORDER — CARVEDILOL 12.5 MG PO TABS
12.5000 mg | ORAL_TABLET | Freq: Two times a day (BID) | ORAL | Status: DC
  Administered 2016-01-30 – 2016-02-01 (×5): 12.5 mg via ORAL
  Filled 2016-01-29 (×8): qty 1

## 2016-01-29 MED ORDER — LORAZEPAM 2 MG/ML IJ SOLN
0.0000 mg | Freq: Four times a day (QID) | INTRAMUSCULAR | Status: AC
  Administered 2016-01-29: 2 mg via INTRAVENOUS
  Administered 2016-01-30: 4 mg via INTRAVENOUS
  Administered 2016-01-30: 1 mg via INTRAVENOUS
  Administered 2016-01-30: 4 mg via INTRAVENOUS
  Administered 2016-01-31 (×2): 2 mg via INTRAVENOUS
  Filled 2016-01-29 (×3): qty 1
  Filled 2016-01-29 (×4): qty 2

## 2016-01-29 MED ORDER — LORAZEPAM 2 MG/ML IJ SOLN
0.0000 mg | Freq: Two times a day (BID) | INTRAMUSCULAR | Status: DC
  Administered 2016-01-31: 2 mg via INTRAVENOUS
  Filled 2016-01-29: qty 1

## 2016-01-29 MED ORDER — LORAZEPAM 1 MG PO TABS: 1.0000 mg | ORAL_TABLET | Freq: Four times a day (QID) | ORAL | Status: AC | PRN

## 2016-01-29 NOTE — ED Provider Notes (Addendum)
CSN: [id number]     Arrival date & time 01/29/16  1417 History   First MD Initiated Contact with Patient 01/29/16 1433     Chief Complaint  Patient presents with  . Joint Pain      The history is provided by the patient and the EMS personnel.   patient presents with left-sided weakness. States he was unable to get Patient presented with difficulty walking. States that last night he was lying on the couch and was unable to get up. States he still is not able to walk. States he is weak on his left side. Denies headache. States he has been falling a lot. Has had recent ER visit for alcoholism patient was not willing to get treatment. Patient states he only drinks about 4 beers a day. No headache. States the bruise on his left chest is from a while ago. Denies fall last night. No chest pain. No trouble breathing. States he does not drink water and will only drink beer.   Past Medical History  Diagnosis Date  . Chest pain   . Hypertension   . Hyperlipidemia   . 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 H/O atrial flutter     ablation in sinus rhythm   . GERD (gastroesophageal reflux disease)     with gas and bloating probably cause of chest pain  . Ischemic cardiomyopathy     H/O with EF at this cath of 45%  . H/O: gout   . 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 Myocardial infarction (HCC)   . Anginal pain (HCC)   . Dysrhythmia   . Anxiety   . Shortness of breath   . Pacemaker   . CHF (congestive heart failure) (HCC)   . Arthritis     gout   Past Surgical History  Procedure Laterality Date  . Coronary artery bypass graft  1993    redo in 2000, he has had multiple MI previous to both procedures.  . Cardiac catheterization  Dec 18, 2003    with a presentation of atrial flutter with rapid ventricular response. He had a cath and two stents to his PDA after his SVG to his RCA. His other graft were  patent. He had LV with EF of 30%.   . Carotid endarterectomy      left in December 2003 and know renal artery stenosis of 60-70%  . Valve replacement  2010  . Implantable cardioverter defibrillator (icd) generator change N/A 06/25/2012    Procedure: ICD GENERATOR CHANGE;  Surgeon: Marinus Maw, MD;  Location[facility] CATH LAB;  Service: Cardiovascular;  Laterality: N/A;  . Bi-ventricular implantable cardioverter defibrillator Right 08/18/2012    Procedure: BI-VENTRICULAR IMPLANTABLE CARDIOVERTER DEFIBRILLATOR  (CRT-D);  Surgeon: Marinus Maw, MD;  Location: [facility] CATH LAB;  Service: Cardiovascular;  Laterality: Right;   No family history on file. Social History  Substance Use Topics  . Smoking status: Never Smoker   . Smokeless tobacco: Current User    Types: Chew  . Alcohol Use: 8.4 oz/week    14 Cans of beer per week    Review of Systems  Constitutional: Positive for appetite change and fatigue.  HENT: Negative for ear pain and facial swelling.   Respiratory: Negative for cough and shortness of breath.   Cardiovascular: Negative for chest pain and leg swelling.  Gastrointestinal: Negative for abdominal pain.  Musculoskeletal: Positive for neck pain.  Negative for back pain.  Skin: Negative for wound.  Neurological: Positive for weakness. Negative for light-headedness and headaches.  Hematological: Bruises/bleeds easily.      Allergies  Atorvastatin  Home Medications   Prior to Admission medications   Medication Sig Start Date End Date Taking? Authorizing Provider  amiodarone (PACERONE) 200 MG tablet TAKE ONE TABLET BY MOUTH ONE TIME DAILY 10/29/15   Lennette Bihari, MD  aspirin EC 81 MG tablet Take by mouth daily.     Historical Provider, MD  carvedilol (COREG) 12.5 MG tablet TAKE ONE TABLET BY MOUTH TWICE A DAY WITH MEALS 11/22/15   Mihai Croitoru, MD  Cyanocobalamin (VITAMIN B-12 PO) Take 1 tablet by mouth daily.    Historical Provider, MD  enalapril (VASOTEC) 10 MG tablet Take  10 mg by mouth daily.    Historical Provider, MD  ezetimibe (ZETIA) 10 MG tablet Take 1 tablet (10 mg total) by mouth daily. 06/29/14   Mihai Croitoru, MD  furosemide (LASIX) 40 MG tablet Take 2 tablets (80 mg total) by mouth daily. 06/22/15   Mihai Croitoru, MD  LORazepam (ATIVAN) 0.5 MG tablet Take 0.5 mg by mouth daily.     Historical Provider, MD  magnesium oxide (MAG-OX) 400 MG tablet Take 400 mg by mouth 2 (two) times daily.    Historical Provider, MD  megestrol (MEGACE) 40 MG/ML suspension Take 600 mg by mouth daily.    Historical Provider, MD  nitroGLYCERIN (NITROSTAT) 0.4 MG SL tablet Place 1 tablet (0.4 mg total) under the tongue every 5 (five) minutes as needed. Chest pain 08/31/13   Susa Griffins, MD  potassium chloride SA (K-DUR,KLOR-CON) 20 MEQ tablet TAKE ONE TABLET BY MOUTH ONE TIME DAILY 10/29/15   Thurmon Fair, MD  spironolactone (ALDACTONE) 25 MG tablet Take 1 tablet (25 mg total) by mouth daily. 11/09/13   Runell Gess, MD   BP 130/66 mmHg  Pulse 69  Temp(Src) 98.4 F (36.9 C) (Oral)  Resp 19  SpO2 96% Physical Exam  Constitutional: He appears well-developed.  HENT:  Head: Atraumatic.  Cardiovascular: Normal rate.   Pulmonary/Chest: Effort normal.  Abdominal: Soft. There is no tenderness.  Musculoskeletal: He exhibits tenderness.  Ecchymosis to left upper chest wall. Good grip strength on right side. Patient does have a decent grip and leg raise on left side appears effort dependent. Patient will not raise it on his own but then later will raise it spontaneously. No cervical tenderness.  Skin: Skin is warm.   tenderness over left anterior shoulder area.  ED Course  Procedures (including critical care time) Labs Review Labs Reviewed  COMPREHENSIVE METABOLIC PANEL  ETHANOL  URINE RAPID DRUG SCREEN, HOSP PERFORMED  CBC WITH DIFFERENTIAL/PLATELET    Imaging Review No results found. I have personally reviewed and evaluated these images and lab results as  part of my medical decision-making.   EKG Interpretation None      MDM   Final diagnoses:  None  Left side weakness Alcoholism Left clavicle fracture  Patient presented with left-sided weakness. Reportedly began yesterday. No acute trauma with that but has had numerous falls. He appears to be an alcoholic. His exam is somewhat variable and there appears to be a effort component to any deficits. Patient states he does have court in the morning. Will admit to internal medicine. X-rays show clavicle fracture. Likely causes some pain.    Benjiman Core, MD 01/29/16 6962  Benjiman Core, MD 01/29/16 [phone]

## 2016-01-29 NOTE — ED Notes (Signed)
Pt's hands are swollen. Multiple bruises over body. Pt has a lage reddish/pirple bruise to left chest area. Per EMS, daughter states pt has been falling around. States he fell several days ago and lay in the yard for hours.

## 2016-01-29 NOTE — H&P (Signed)
PCP:   Fabio Neighbors   Chief Complaint:  Left arm pain  HPI: 75 yr old  male who   has a past medical history of Chest pain; Hypertension; Hyperlipidemia; PVD (peripheral vascular disease) (HCC); H/O atrial flutter; GERD (gastroesophageal reflux disease); Ischemic cardiomyopathy; H/O: gout; ICD (implantable cardiac defibrillator) infection, s/p removal of ICD (07/12/2012); Myocardial infarction (HCC); Anginal pain (HCC); Dysrhythmia; Anxiety; Shortness of breath; Pacemaker; CHF (congestive heart failure) (HCC); and Arthritis. Patient has a history of alcohol abuse, and was intoxicated last night patient says that he slept on the couch and was unable to get up this morning and complained of pain in the left arm and had difficulty walking. In the ED patient was unable to move left shoulder and there was a bruise on the left side of the chest. Patient does not remember falling. Imaging studies in the ED showed distal clavicle fracture, also showed possible broad-based C6-7 central disc protrusion or extrusion. Recommended MRI. Patient denies nausea vomiting or diarrhea. No chest pain. He has significant history of CAD status post CABG.   Allergies:   Allergies  Allergen Reactions  . Atorvastatin Other (See Comments)    Joint pain.      Past Medical History  Diagnosis Date  . Chest pain   . Hypertension   . Hyperlipidemia   . 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 H/O atrial flutter     ablation in sinus rhythm   . GERD (gastroesophageal reflux disease)     with gas and bloating probably cause of chest pain  . Ischemic cardiomyopathy     H/O with EF at this cath of 45%  . H/O: gout   . 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 Myocardial infarction (HCC)   . Anginal pain (HCC)   . Dysrhythmia   . Anxiety   . Shortness of breath   . Pacemaker   . CHF (congestive heart failure) (HCC)   .  Arthritis     gout    Past Surgical History  Procedure Laterality Date  . Coronary artery bypass graft  1993    redo in 2000, he has had multiple MI previous to both procedures.  . Cardiac catheterization  Dec 18, 2003    with a presentation of atrial flutter with rapid ventricular response. He had a cath and two stents to his PDA after his SVG to his RCA. His other graft were patent. He had LV with EF of 30%.   . Carotid endarterectomy      left in December 2003 and know renal artery stenosis of 60-70%  . Valve replacement  2010  . Implantable cardioverter defibrillator (icd) generator change N/A 06/25/2012    Procedure: ICD GENERATOR CHANGE;  Surgeon: Marinus Maw, MD;  Location: [facility] CATH LAB;  Service: Cardiovascular;  Laterality: N/A;  . Bi-ventricular implantable cardioverter defibrillator Right 08/18/2012    Procedure: BI-VENTRICULAR IMPLANTABLE CARDIOVERTER DEFIBRILLATOR  (CRT-D);  Surgeon: Marinus Maw, MD;  Location: [facility] CATH LAB;  Service: Cardiovascular;  Laterality: Right;    Prior to Admission medications   Medication Sig Start Date End Date Taking? Authorizing Provider  amiodarone (PACERONE) 200 MG tablet TAKE ONE TABLET BY MOUTH ONE TIME DAILY 10/29/15  Yes Lennette Bihari, MD  furosemide (LASIX) 40 MG tablet Take 2 tablets (80 mg total) by mouth daily. 06/22/15  Yes Mihai Croitoru, MD  potassium chloride  SA (K-DUR,KLOR-CON) 20 MEQ tablet TAKE ONE TABLET BY MOUTH ONE TIME DAILY 10/29/15  Yes Mihai Croitoru, MD  aspirin EC 81 MG tablet Take by mouth daily.     Historical Provider, MD  carvedilol (COREG) 12.5 MG tablet TAKE ONE TABLET BY MOUTH TWICE A DAY WITH MEALS 11/22/15   Mihai Croitoru, MD  Cyanocobalamin (VITAMIN B-12 PO) Take 1 tablet by mouth daily.    Historical Provider, MD  enalapril (VASOTEC) 10 MG tablet Take 10 mg by mouth daily.    Historical Provider, MD  ezetimibe (ZETIA) 10 MG tablet Take 1 tablet (10 mg total) by mouth daily. 06/29/14   Mihai Croitoru, MD    LORazepam (ATIVAN) 0.5 MG tablet Take 0.5 mg by mouth daily.     Historical Provider, MD  magnesium oxide (MAG-OX) 400 MG tablet Take 400 mg by mouth 2 (two) times daily.    Historical Provider, MD  megestrol (MEGACE) 40 MG/ML suspension Take 600 mg by mouth daily.    Historical Provider, MD  nitroGLYCERIN (NITROSTAT) 0.4 MG SL tablet Place 1 tablet (0.4 mg total) under the tongue every 5 (five) minutes as needed. Chest pain 08/31/13   Susa Griffins, MD  spironolactone (ALDACTONE) 25 MG tablet Take 1 tablet (25 mg total) by mouth daily. 11/09/13   Runell Gess, MD    Social History:  reports that he has never smoked. His smokeless tobacco use includes Chew. He reports that he drinks about 8.4 oz of alcohol per week. He reports that he does not use illicit drugs.   All the positives are listed in BOLD  Review of Systems:  HEENT: Headache, blurred vision, runny nose, sore throat Neck: Hypothyroidism, hyperthyroidism,,lymphadenopathy Chest : Shortness of breath, history of COPD, Asthma Heart : Chest pain, history of coronary arterey disease GI:  Nausea, vomiting, diarrhea, constipation, GERD GU: Dysuria, urgency, frequency of urination, hematuria Neuro: Stroke, seizures, syncope Psych: Depression, anxiety, hallucinations   Physical Exam: Blood pressure 146/77, pulse 71, temperature 98.4 F (36.9 C), temperature source Oral, resp. rate 17, SpO2 100 %. Constitutional:   Patient is a well-developed and well-nourished male in no acute distress and cooperative with exam. Head: Normocephalic and atraumatic Mouth: Mucus membranes moist Eyes: PERRL, EOMI, conjunctivae normal Neck: Supple, No Thyromegaly Cardiovascular: RRR, S1 normal, S2 normal Pulmonary/Chest: Clear to auscultation bilaterally, ecchymosis noted in the left anterior chest wall Abdominal: Soft. Non-tender, non-distended, bowel sounds are normal, no masses, organomegaly, or guarding present.  Neurological: A&O x3,  Strength is normal and symmetric bilaterally, cranial nerve II-XII are grossly intact, no focal motor deficit, sensory intact to light touch bilaterally.  Extremities : Unable to move left shoulder due to pain, tenderness at the acromioclavicular joint  Labs on Admission:  Basic Metabolic Panel:  Recent Labs Lab 01/29/16 1520  NA 134*  K 3.1*  CL 96*  CO2 25  GLUCOSE 96  BUN 12  CREATININE 0.98  CALCIUM 8.5*   Liver Function Tests:  Recent Labs Lab 01/29/16 1520  AST 52*  ALT 34  ALKPHOS 59  BILITOT 0.6  PROT 7.4  ALBUMIN 3.3*   No results for input(s): LIPASE, AMYLASE in the last 168 hours. No results for input(s): AMMONIA in the last 168 hours. CBC:  Recent Labs Lab 01/29/16 1520  WBC 7.2  NEUTROABS 5.1  HGB 8.7*  HCT 27.6*  MCV 77.3*  PLT 260    Radiological Exams on Admission: Dg Chest 1 View  01/29/2016  CLINICAL DATA:  Weakness.  Syncopal  episodes. EXAM: CHEST 1 VIEW COMPARISON:  Left shoulder radiographs obtained earlier today. Chest radiographs dated 08/19/2012. FINDINGS: Decreased inspiration with no gross change in enlargement of the cardiac silhouette. The right subclavian pacer and AICD leads are unchanged. Stable post CABG changes. Mild bibasilar atelectasis. Otherwise, clear lungs. Old, healed left rib fractures are again demonstrated. There is also a probable acute fracture of the lateral aspect of the left seventh rib. Previously described distal left clavicle fracture. IMPRESSION: 1. Previously described distal left clavicle fracture. 2. Probable acute left seventh rib fracture. 3. Decreased inspiration with mild bibasilar atelectasis. Electronically Signed   By: Beckie Salts M.D.   On: 01/29/2016 16:11   Ct Head Wo Contrast  01/29/2016  CLINICAL DATA:  75 year old who fell approximately 2 days ago and was unable to get up, lying in his yard for several hours before he was found. Frequent falls at baseline, due to possible syncopal episodes. Current  history of cerebrovascular disease with prior carotid endarterectomy. He presents now with acute onset of left-sided weakness which began earlier today. Initial encounter. EXAM: CT HEAD WITHOUT CONTRAST CT CERVICAL SPINE WITHOUT CONTRAST TECHNIQUE: Multidetector CT imaging of the head and cervical spine was performed following the standard protocol without intravenous contrast. Multiplanar CT image reconstructions of the cervical spine were also generated. COMPARISON:  Head CT 03/02/2013.  No prior cervical spine imaging. FINDINGS: CT HEAD FINDINGS Moderate age related cortical and deep atrophy, not significantly changed. Mild changes of small vessel disease of the white matter, unchanged. Dystrophic cortical calcification involving the right frontotemporal region, unchanged. No mass lesion. No midline shift. No acute hemorrhage or hematoma. No extra-axial fluid collections. No evidence of acute infarction. No skull fracture or other focal osseous abnormality involving the skull. Visualized paranasal sinuses, bilateral mastoid air cells and bilateral middle ear cavities well-aerated. Severe bilateral carotid siphon and vertebral artery atherosclerosis. CT CERVICAL SPINE FINDINGS The patient's head is rotated slightly to the right. No fractures identified involving the cervical spine. Sagittal reconstructed images demonstrate degenerative grade 1 spondylolisthesis of C3 on C4 approximating 4 mm and C4 on C5 approximating 2 mm. Facet joints intact throughout with degenerative changes, particularly on the left. Disc space narrowing at C3-4 and C5-6. Soft tissue window images suggest a broad-based disc protrusion or extrusion at C6-7. Coronal reformatted images demonstrate an intact craniocervical junction, intact C1-C2 articulation with degenerative changes, intact dens, and intact lateral masses throughout. Uncinate and predominantly facet hypertrophy account for multilevel foraminal stenoses including moderate left  C2-3, severe bilateral C3-4, severe bilateral C4-5, severe bilateral C5-6, moderate right C6-7 and mild right C7-T1. IMPRESSION: 1. No acute intracranial abnormality. 2. Stable moderate generalized atrophy and mild chronic microvascular ischemic changes the white matter. 3. No fractures identified involving the cervical spine. 4. Degenerative changes throughout the cervical spine as detailed above. 5. Possible broad-based central disc protrusion or extrusion at C6-7. If the patient has radicular symptoms, MRI of the cervical spine without contrast could be performed on a non urgent basis. Electronically Signed   By: Hulan Saas M.D.   On: 01/29/2016 16:37   Ct Cervical Spine Wo Contrast  01/29/2016  CLINICAL DATA:  75 year old who fell approximately 2 days ago and was unable to get up, lying in his yard for several hours before he was found. Frequent falls at baseline, due to possible syncopal episodes. Current history of cerebrovascular disease with prior carotid endarterectomy. He presents now with acute onset of left-sided weakness which began earlier today. Initial encounter.  EXAM: CT HEAD WITHOUT CONTRAST CT CERVICAL SPINE WITHOUT CONTRAST TECHNIQUE: Multidetector CT imaging of the head and cervical spine was performed following the standard protocol without intravenous contrast. Multiplanar CT image reconstructions of the cervical spine were also generated. COMPARISON:  Head CT 03/02/2013.  No prior cervical spine imaging. FINDINGS: CT HEAD FINDINGS Moderate age related cortical and deep atrophy, not significantly changed. Mild changes of small vessel disease of the white matter, unchanged. Dystrophic cortical calcification involving the right frontotemporal region, unchanged. No mass lesion. No midline shift. No acute hemorrhage or hematoma. No extra-axial fluid collections. No evidence of acute infarction. No skull fracture or other focal osseous abnormality involving the skull. Visualized paranasal  sinuses, bilateral mastoid air cells and bilateral middle ear cavities well-aerated. Severe bilateral carotid siphon and vertebral artery atherosclerosis. CT CERVICAL SPINE FINDINGS The patient's head is rotated slightly to the right. No fractures identified involving the cervical spine. Sagittal reconstructed images demonstrate degenerative grade 1 spondylolisthesis of C3 on C4 approximating 4 mm and C4 on C5 approximating 2 mm. Facet joints intact throughout with degenerative changes, particularly on the left. Disc space narrowing at C3-4 and C5-6. Soft tissue window images suggest a broad-based disc protrusion or extrusion at C6-7. Coronal reformatted images demonstrate an intact craniocervical junction, intact C1-C2 articulation with degenerative changes, intact dens, and intact lateral masses throughout. Uncinate and predominantly facet hypertrophy account for multilevel foraminal stenoses including moderate left C2-3, severe bilateral C3-4, severe bilateral C4-5, severe bilateral C5-6, moderate right C6-7 and mild right C7-T1. IMPRESSION: 1. No acute intracranial abnormality. 2. Stable moderate generalized atrophy and mild chronic microvascular ischemic changes the white matter. 3. No fractures identified involving the cervical spine. 4. Degenerative changes throughout the cervical spine as detailed above. 5. Possible broad-based central disc protrusion or extrusion at C6-7. If the patient has radicular symptoms, MRI of the cervical spine without contrast could be performed on a non urgent basis. Electronically Signed   By: Hulan Saas M.D.   On: 01/29/2016 16:37   Dg Shoulder Left  01/29/2016  CLINICAL DATA:  Left shoulder pain and bruising.  Probable fall. EXAM: LEFT SHOULDER - 2+ VIEW COMPARISON:  None. FINDINGS: Minimally comminuted oblique fracture of the distal clavicle with almost 1 shaft width of superior displacement of the distal fragment and overlapping of the fragments. The distal fragment  maintains a normal relationship with the acromion and the coracoclavicular distance is narrowed. IMPRESSION: Distal clavicle fracture, as described above. Electronically Signed   By: Beckie Salts M.D.   On: 01/29/2016 16:06    EKG: Independently reviewed.  Based rhythm   Assessment/Plan Active Problems:   Left-sided weakness   Neck pain   Clavicle fracture    Hypokalemia    Distal clavicle fracture Patient has distal clavicle fracture seen on the x-ray of the shoulder. Will put the left arm in sling Pain control with Dilaudid Flexeril 10 mg by mouth 3 times a day  Alcohol abuse Start the patient on CIWA protocol  Hypokalemia Replace potassium and check BMP in a.m.  Neck pain Patient may have possible C6-7 central disc protrusion. Will not be able to get MRI as patient has pacemaker/defibrillator in place Continue pain control, Flexeril  History of CAD Stable, continue Coreg, aspirin, Aldactone  History of atrial fibrillation Continue low-dose amiodarone. Patient not on anticoagulation due to remote history of GI bleed as per cardiology note from 06/04/2014  DVT prophylaxis Lovenox   Code status: Full code  Family discussion: No family  at bedside   Time Spent on Admission: 60 min  [facility] Pager: [phone] 01/29/2016, 7:36 PM  If 7PM-7AM, please contact night-coverage  [email or web address]  Password [email or web address]

## 2016-01-29 NOTE — ED Notes (Signed)
Phone number for son [phone]

## 2016-01-29 NOTE — ED Notes (Signed)
Pt keeps removing oxygen probe.  Spot check sats 98% on room air.

## 2016-01-29 NOTE — ED Notes (Signed)
Pt has no specific complaint.  Bruising noted to left chest.

## 2016-01-29 NOTE — ED Notes (Signed)
Pt here for frequent falls and joint pain. Pt states he drinks everyday " I love my beer "

## 2016-01-30 DIAGNOSIS — F10230 Alcohol dependence with withdrawal, uncomplicated: Secondary | ICD-10-CM | POA: Diagnosis not present

## 2016-01-30 DIAGNOSIS — D519 Vitamin B12 deficiency anemia, unspecified: Secondary | ICD-10-CM | POA: Diagnosis not present

## 2016-01-30 DIAGNOSIS — D649 Anemia, unspecified: Secondary | ICD-10-CM

## 2016-01-30 DIAGNOSIS — F1010 Alcohol abuse, uncomplicated: Secondary | ICD-10-CM

## 2016-01-30 DIAGNOSIS — S42002A Fracture of unspecified part of left clavicle, initial encounter for closed fracture: Secondary | ICD-10-CM | POA: Diagnosis not present

## 2016-01-30 DIAGNOSIS — I10 Essential (primary) hypertension: Secondary | ICD-10-CM | POA: Diagnosis not present

## 2016-01-30 DIAGNOSIS — D518 Other vitamin B12 deficiency anemias: Secondary | ICD-10-CM | POA: Diagnosis not present

## 2016-01-30 DIAGNOSIS — E538 Deficiency of other specified B group vitamins: Secondary | ICD-10-CM | POA: Diagnosis not present

## 2016-01-30 DIAGNOSIS — S42002D Fracture of unspecified part of left clavicle, subsequent encounter for fracture with routine healing: Secondary | ICD-10-CM | POA: Diagnosis not present

## 2016-01-30 DIAGNOSIS — E785 Hyperlipidemia, unspecified: Secondary | ICD-10-CM

## 2016-01-30 DIAGNOSIS — K219 Gastro-esophageal reflux disease without esophagitis: Secondary | ICD-10-CM

## 2016-01-30 LAB — CBC
HEMATOCRIT: 26.4 % — AB (ref 39.0–52.0)
HEMOGLOBIN: 8.3 g/dL — AB (ref 13.0–17.0)
MCH: 24.6 pg — AB (ref 26.0–34.0)
MCHC: 31.4 g/dL (ref 30.0–36.0)
MCV: 78.1 fL (ref 78.0–100.0)
Platelets: 244 10*3/uL (ref 150–400)
RBC: 3.38 MIL/uL — AB (ref 4.22–5.81)
RDW: 16.4 % — ABNORMAL HIGH (ref 11.5–15.5)
WBC: 6.3 10*3/uL (ref 4.0–10.5)

## 2016-01-30 LAB — COMPREHENSIVE METABOLIC PANEL
ALBUMIN: 3.1 g/dL — AB (ref 3.5–5.0)
ALK PHOS: 60 U/L (ref 38–126)
ALT: 32 U/L (ref 17–63)
AST: 50 U/L — ABNORMAL HIGH (ref 15–41)
Anion gap: 11 (ref 5–15)
BILIRUBIN TOTAL: 1.3 mg/dL — AB (ref 0.3–1.2)
BUN: 12 mg/dL (ref 6–20)
CALCIUM: 8.5 mg/dL — AB (ref 8.9–10.3)
CO2: 26 mmol/L (ref 22–32)
CREATININE: 0.97 mg/dL (ref 0.61–1.24)
Chloride: 99 mmol/L — ABNORMAL LOW (ref 101–111)
GFR calc Af Amer: >60 mL/min (ref >60)
GFR calc non Af Amer: >60 mL/min (ref >60)
GLUCOSE: 75 mg/dL (ref 65–99)
Potassium: 3.7 mmol/L (ref 3.5–5.1)
Sodium: 136 mmol/L (ref 135–145)
TOTAL PROTEIN: 6.8 g/dL (ref 6.5–8.1)

## 2016-01-30 MED ORDER — PANTOPRAZOLE SODIUM 40 MG PO TBEC
40.0000 mg | DELAYED_RELEASE_TABLET | Freq: Every day | ORAL | Status: DC
  Administered 2016-01-30 – 2016-02-02 (×4): 40 mg via ORAL
  Filled 2016-01-30 (×5): qty 1

## 2016-01-30 MED ORDER — TRAMADOL HCL 50 MG PO TABS
50.0000 mg | ORAL_TABLET | Freq: Four times a day (QID) | ORAL | Status: DC | PRN
  Administered 2016-01-31 (×2): 50 mg via ORAL
  Filled 2016-01-30 (×2): qty 1

## 2016-01-30 MED ORDER — ACETAMINOPHEN 325 MG PO TABS: 650.0000 mg | ORAL_TABLET | Freq: Four times a day (QID) | ORAL | Status: DC | PRN

## 2016-01-30 MED ORDER — CYCLOBENZAPRINE HCL 10 MG PO TABS
10.0000 mg | ORAL_TABLET | Freq: Three times a day (TID) | ORAL | Status: DC | PRN
  Administered 2016-02-01: 10 mg via ORAL
  Filled 2016-01-30 (×2): qty 1

## 2016-01-30 NOTE — Progress Notes (Signed)
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 But patient on acute withdrawal.    Consultants:  None   Procedures:  See below for x-ray reports   Antibiotics:  None   HPI/Subjective: Afebrile, no CP, no nausea, no vomiting. Complaining of bilat shoulders pain. Patient is tremulous and somnolent oriented to person and place only.   Objective: Filed Vitals:   01/30/16 0515 01/30/16 1400  BP: 135/62 124/71  Pulse: 76 71  Temp: 99.3 F (37.4 C) 98.8 F (37.1 C)  Resp: 18 18    Intake/Output Summary (Last 24 hours) at 01/30/16 1837 Last data filed at 01/30/16 1800  Gross per 24 hour  Intake    363 ml  Output      0 ml  Net    363 ml   Filed Weights   01/29/16 2053  Weight: 84.188 kg (185 lb 9.6 oz)    Exam:   General:  Tremulous, somnolent and confused. Patient complaining of pain in his shoulders. No fever,  no nausea, no vomiting.  Cardiovascular: S1 and s2, no rubs or gallops  Respiratory: no wheezing, good air movement, no rales   Abdomen: soft, NT, ND, positive BS  Musculoskeletal: left upper ext on a sling, no LE edema appreciated, no cyanosis   Data Reviewed: Basic Metabolic Panel:  Recent Labs Lab 01/29/16 1520 01/30/16 0542  NA 134* 136  K 3.1* 3.7  CL 96* 99*  CO2 25 26  GLUCOSE 96 75  BUN 12 12  CREATININE 0.98 0.97  CALCIUM 8.5* 8.5*   Liver Function Tests:  Recent Labs Lab 01/29/16 1520 01/30/16 0542  AST 52* 50*  ALT 34 32  ALKPHOS 59 60  BILITOT 0.6 1.3*  PROT 7.4 6.8  ALBUMIN 3.3* 3.1*   CBC:  Recent Labs Lab 01/29/16 1520 01/30/16 0542  WBC 7.2 6.3  NEUTROABS 5.1  --   HGB 8.7* 8.3*  HCT 27.6* 26.4*  MCV 77.3* 78.1  PLT 260 244   Studies: Dg Chest 1 View  01/29/2016  CLINICAL DATA:  Weakness.  Syncopal episodes. EXAM: CHEST 1 VIEW COMPARISON:  Left shoulder radiographs obtained earlier today. Chest radiographs dated 08/19/2012. FINDINGS: Decreased inspiration with no gross change in enlargement of the cardiac silhouette. The right subclavian pacer and AICD leads are unchanged. Stable post CABG changes. Mild bibasilar atelectasis. Otherwise, clear lungs. Old, healed left rib fractures are again demonstrated. There is also a probable acute fracture of the lateral aspect of the left  seventh rib. Previously described distal left clavicle fracture. IMPRESSION: 1. Previously described distal left clavicle fracture. 2. Probable acute left seventh rib fracture. 3. Decreased inspiration with mild bibasilar atelectasis. Electronically Signed   By: Beckie Salts M.D.   On: 01/29/2016 16:11   Ct Head Wo Contrast  01/29/2016  CLINICAL DATA:  75 year old who fell approximately 2 days ago and was unable to get up, lying in his yard for several hours before he was found. Frequent falls at baseline, due to possible syncopal episodes. Current history of cerebrovascular  disease with prior carotid endarterectomy. He presents now with acute onset of left-sided weakness which began earlier today. Initial encounter. EXAM: CT HEAD WITHOUT CONTRAST CT CERVICAL SPINE WITHOUT CONTRAST TECHNIQUE: Multidetector CT imaging of the head and cervical spine was performed following the standard protocol without intravenous contrast. Multiplanar CT image reconstructions of the cervical spine were also generated. COMPARISON:  Head CT 03/02/2013.  No prior cervical spine imaging. FINDINGS: CT HEAD FINDINGS Moderate age related cortical and deep atrophy, not significantly changed. Mild changes of small vessel disease of the white matter, unchanged. Dystrophic cortical calcification involving the right frontotemporal region, unchanged. No mass lesion. No midline shift. No acute hemorrhage or hematoma. No extra-axial fluid collections. No evidence of acute infarction. No skull fracture or other focal osseous abnormality involving the skull. Visualized paranasal sinuses, bilateral mastoid air cells and bilateral middle ear cavities well-aerated. Severe bilateral carotid siphon and vertebral artery atherosclerosis. CT CERVICAL SPINE FINDINGS The patient's head is rotated slightly to the right. No fractures identified involving the cervical spine. Sagittal reconstructed images demonstrate degenerative grade 1 spondylolisthesis of C3 on C4 approximating 4 mm and C4 on C5 approximating 2 mm. Facet joints intact throughout with degenerative changes, particularly on the left. Disc space narrowing at C3-4 and C5-6. Soft tissue window images suggest a broad-based disc protrusion or extrusion at C6-7. Coronal reformatted images demonstrate an intact craniocervical junction, intact C1-C2 articulation with degenerative changes, intact dens, and intact lateral masses throughout. Uncinate and predominantly facet hypertrophy account for multilevel foraminal stenoses including moderate left C2-3, severe bilateral C3-4,  severe bilateral C4-5, severe bilateral C5-6, moderate right C6-7 and mild right C7-T1. IMPRESSION: 1. No acute intracranial abnormality. 2. Stable moderate generalized atrophy and mild chronic microvascular ischemic changes the white matter. 3. No fractures identified involving the cervical spine. 4. Degenerative changes throughout the cervical spine as detailed above. 5. Possible broad-based central disc protrusion or extrusion at C6-7. If the patient has radicular symptoms, MRI of the cervical spine without contrast could be performed on a non urgent basis. Electronically Signed   By: Hulan Saas M.D.   On: 01/29/2016 16:37   Ct Cervical Spine Wo Contrast  01/29/2016  CLINICAL DATA:  75 year old who fell approximately 2 days ago and was unable to get up, lying in his yard for several hours before he was found. Frequent falls at baseline, due to possible syncopal episodes. Current history of cerebrovascular disease with prior carotid endarterectomy. He presents now with acute onset of left-sided weakness which began earlier today. Initial encounter. EXAM: CT HEAD WITHOUT CONTRAST CT CERVICAL SPINE WITHOUT CONTRAST TECHNIQUE: Multidetector CT imaging of the head and cervical spine was performed following the standard protocol without intravenous contrast. Multiplanar CT image reconstructions of the cervical spine were also generated. COMPARISON:  Head CT 03/02/2013.  No prior cervical spine imaging. FINDINGS: CT HEAD FINDINGS Moderate age related cortical and deep atrophy, not significantly changed. Mild changes of small vessel disease  of the white matter, unchanged. Dystrophic cortical calcification involving the right frontotemporal region, unchanged. No mass lesion. No midline shift. No acute hemorrhage or hematoma. No extra-axial fluid collections. No evidence of acute infarction. No skull fracture or other focal osseous abnormality involving the skull. Visualized paranasal sinuses, bilateral mastoid air  cells and bilateral middle ear cavities well-aerated. Severe bilateral carotid siphon and vertebral artery atherosclerosis. CT CERVICAL SPINE FINDINGS The patient's head is rotated slightly to the right. No fractures identified involving the cervical spine. Sagittal reconstructed images demonstrate degenerative grade 1 spondylolisthesis of C3 on C4 approximating 4 mm and C4 on C5 approximating 2 mm. Facet joints intact throughout with degenerative changes, particularly on the left. Disc space narrowing at C3-4 and C5-6. Soft tissue window images suggest a broad-based disc protrusion or extrusion at C6-7. Coronal reformatted images demonstrate an intact craniocervical junction, intact C1-C2 articulation with degenerative changes, intact dens, and intact lateral masses throughout. Uncinate and predominantly facet hypertrophy account for multilevel foraminal stenoses including moderate left C2-3, severe bilateral C3-4, severe bilateral C4-5, severe bilateral C5-6, moderate right C6-7 and mild right C7-T1. IMPRESSION: 1. No acute intracranial abnormality. 2. Stable moderate generalized atrophy and mild chronic microvascular ischemic changes the white matter. 3. No fractures identified involving the cervical spine. 4. Degenerative changes throughout the cervical spine as detailed above. 5. Possible broad-based central disc protrusion or extrusion at C6-7. If the patient has radicular symptoms, MRI of the cervical spine without contrast could be performed on a non urgent basis. Electronically Signed   By: Hulan Saas M.D.   On: 01/29/2016 16:37   Dg Shoulder Left  01/29/2016  CLINICAL DATA:  Left shoulder pain and bruising.  Probable fall. EXAM: LEFT SHOULDER - 2+ VIEW COMPARISON:  None. FINDINGS: Minimally comminuted oblique fracture of the distal clavicle with almost 1 shaft width of superior displacement of the distal fragment and overlapping of the fragments. The distal fragment maintains a normal relationship  with the acromion and the coracoclavicular distance is narrowed. IMPRESSION: Distal clavicle fracture, as described above. Electronically Signed   By: Beckie Salts M.D.   On: 01/29/2016 16:06    Scheduled Meds: . amiodarone  200 mg Oral Daily  . aspirin EC  81 mg Oral Daily  . carvedilol  12.5 mg Oral BID WC  . enalapril  10 mg Oral Daily  . enoxaparin (LOVENOX) injection  40 mg Subcutaneous Q24H  . ezetimibe  10 mg Oral Daily  . folic acid  1 mg Oral Daily  . LORazepam  0-4 mg Intravenous Q6H   Followed by  . [START ON 01/31/2016] LORazepam  0-4 mg Intravenous Q12H  . magnesium oxide  400 mg Oral BID  . megestrol  600 mg Oral Daily  . multivitamin with minerals  1 tablet Oral Daily  . potassium chloride SA  20 mEq Oral Daily  . sodium chloride flush  3 mL Intravenous Q12H  . spironolactone  25 mg Oral Daily  . thiamine  100 mg Oral Daily   Or  . thiamine  100 mg Intravenous Daily    Time spent:30 minutes    Vassie Loll MD  [facility] Pager [phone]. If 7PM-7AM, please contact night-coverage at [email or web address], password [facility] 01/30/2016, 6:37 PM

## 2016-01-30 NOTE — Progress Notes (Signed)
PT Cancellation Note  Patient Details Name: [name] MRN: [id number] DOB: [birth year]   Cancelled Treatment:    Reason Eval/Treat Not Completed: Patient's level of consciousness;Fatigue/lethargy limiting ability to participate. Pt asleep upon entry, requiring heavy physical stimulation to awaken. Pt remains somnolent throughout bed mobility attempts. Pt requires Max assistance to come to EOB, demonstrating forceful intentional tremor. Discussed with RN. Will wait until pt is more appropriate for PT eval. Will attempt again at later date/time as appropriate.    10:42 AM, 01/30/2016 Rosamaria Lints, PT, DPT PRN Physical Therapist at [facility] [place] License # [id number] [phone] (wireless)  [phone] (mobile)

## 2016-01-30 NOTE — Care Management Obs Status (Signed)
MEDICARE OBSERVATION STATUS NOTIFICATION   Patient Details  Name: [name] MRN: [id number] Date of Birth: [birth year]   Medicare Observation Status Notification Given:  Yes    Malcolm Metro, RN 01/30/2016, 12:24 PM

## 2016-01-30 NOTE — Care Management Note (Signed)
Case Management Note  Patient Details  Name: [name] MRN: [id number] Date of Birth: 1941-05-15  Subjective/Objective:                  Pt is from home, lives alone and is ind with ADL's at baseline. Pt does use cane with ambulation. Attempted to assess pt but pt is confused and tremulous and unable to complete assessment at this time. Pt's son, Jorja Loa, called and provided information. Pt was supposed to have court date today for "50B" with his wife. Per son, when pt discharges he will be home alone as family unwilling/unable to provide continued support due to pt's ETOH abuse. Pt's son should be contacted for updates or needs. Jorja Loa [phone]). Tim will take pt home at DC. Pt currently in withdrawal. Attending made aware as this is a change since admission.    Action/Plan: Will cont to follow for DC planning.   Expected Discharge Date:  01/31/16               Expected Discharge Plan:  Home/Self Care  In-House Referral:  NA  Discharge planning Services  CM Consult  Post Acute Care Choice:  NA Choice offered to:  NA  DME Arranged:    DME Agency:     HH Arranged:    HH Agency:     Status of Service:  In process, will continue to follow  Medicare Important Message Given:    Date Medicare IM Given:    Medicare IM give by:    Date Additional Medicare IM Given:    Additional Medicare Important Message give by:     If discussed at Long Length of Stay Meetings, dates discussed:    Additional Comments:  Malcolm Metro, RN 01/30/2016, 12:25 PM

## 2016-01-31 DIAGNOSIS — F10230 Alcohol dependence with withdrawal, uncomplicated: Secondary | ICD-10-CM | POA: Diagnosis not present

## 2016-01-31 DIAGNOSIS — K219 Gastro-esophageal reflux disease without esophagitis: Secondary | ICD-10-CM | POA: Diagnosis present

## 2016-01-31 DIAGNOSIS — I10 Essential (primary) hypertension: Secondary | ICD-10-CM | POA: Diagnosis not present

## 2016-01-31 DIAGNOSIS — Z951 Presence of aortocoronary bypass graft: Secondary | ICD-10-CM | POA: Diagnosis not present

## 2016-01-31 DIAGNOSIS — S42002A Fracture of unspecified part of left clavicle, initial encounter for closed fracture: Secondary | ICD-10-CM | POA: Diagnosis not present

## 2016-01-31 DIAGNOSIS — I739 Peripheral vascular disease, unspecified: Secondary | ICD-10-CM | POA: Diagnosis present

## 2016-01-31 DIAGNOSIS — E876 Hypokalemia: Secondary | ICD-10-CM | POA: Diagnosis present

## 2016-01-31 DIAGNOSIS — D518 Other vitamin B12 deficiency anemias: Secondary | ICD-10-CM | POA: Diagnosis not present

## 2016-01-31 DIAGNOSIS — E860 Dehydration: Secondary | ICD-10-CM | POA: Diagnosis present

## 2016-01-31 DIAGNOSIS — I110 Hypertensive heart disease with heart failure: Secondary | ICD-10-CM | POA: Diagnosis present

## 2016-01-31 DIAGNOSIS — E538 Deficiency of other specified B group vitamins: Secondary | ICD-10-CM | POA: Diagnosis not present

## 2016-01-31 DIAGNOSIS — Z79899 Other long term (current) drug therapy: Secondary | ICD-10-CM | POA: Diagnosis not present

## 2016-01-31 DIAGNOSIS — F10239 Alcohol dependence with withdrawal, unspecified: Secondary | ICD-10-CM | POA: Diagnosis present

## 2016-01-31 DIAGNOSIS — D519 Vitamin B12 deficiency anemia, unspecified: Secondary | ICD-10-CM | POA: Diagnosis not present

## 2016-01-31 DIAGNOSIS — W19XXXA Unspecified fall, initial encounter: Secondary | ICD-10-CM | POA: Diagnosis present

## 2016-01-31 DIAGNOSIS — R531 Weakness: Secondary | ICD-10-CM | POA: Diagnosis present

## 2016-01-31 DIAGNOSIS — M542 Cervicalgia: Secondary | ICD-10-CM | POA: Diagnosis present

## 2016-01-31 DIAGNOSIS — F10939 Alcohol use, unspecified with withdrawal, unspecified: Secondary | ICD-10-CM | POA: Diagnosis present

## 2016-01-31 DIAGNOSIS — S42032A Displaced fracture of lateral end of left clavicle, initial encounter for closed fracture: Secondary | ICD-10-CM | POA: Diagnosis present

## 2016-01-31 DIAGNOSIS — S42002D Fracture of unspecified part of left clavicle, subsequent encounter for fracture with routine healing: Secondary | ICD-10-CM

## 2016-01-31 DIAGNOSIS — I5042 Chronic combined systolic (congestive) and diastolic (congestive) heart failure: Secondary | ICD-10-CM | POA: Diagnosis present

## 2016-01-31 DIAGNOSIS — Z95810 Presence of automatic (implantable) cardiac defibrillator: Secondary | ICD-10-CM | POA: Diagnosis not present

## 2016-01-31 DIAGNOSIS — I252 Old myocardial infarction: Secondary | ICD-10-CM | POA: Diagnosis not present

## 2016-01-31 DIAGNOSIS — F419 Anxiety disorder, unspecified: Secondary | ICD-10-CM | POA: Diagnosis present

## 2016-01-31 DIAGNOSIS — E785 Hyperlipidemia, unspecified: Secondary | ICD-10-CM | POA: Diagnosis present

## 2016-01-31 DIAGNOSIS — Z7982 Long term (current) use of aspirin: Secondary | ICD-10-CM | POA: Diagnosis not present

## 2016-01-31 DIAGNOSIS — F17220 Nicotine dependence, chewing tobacco, uncomplicated: Secondary | ICD-10-CM | POA: Diagnosis present

## 2016-01-31 DIAGNOSIS — I482 Chronic atrial fibrillation: Secondary | ICD-10-CM | POA: Diagnosis present

## 2016-01-31 DIAGNOSIS — Z952 Presence of prosthetic heart valve: Secondary | ICD-10-CM | POA: Diagnosis not present

## 2016-01-31 DIAGNOSIS — D638 Anemia in other chronic diseases classified elsewhere: Secondary | ICD-10-CM | POA: Diagnosis present

## 2016-01-31 DIAGNOSIS — I2510 Atherosclerotic heart disease of native coronary artery without angina pectoris: Secondary | ICD-10-CM | POA: Diagnosis present

## 2016-01-31 LAB — BASIC METABOLIC PANEL
Anion gap: 9 (ref 5–15)
BUN: 26 mg/dL — AB (ref 6–20)
CALCIUM: 8.5 mg/dL — AB (ref 8.9–10.3)
CO2: 28 mmol/L (ref 22–32)
CREATININE: 1.34 mg/dL — AB (ref 0.61–1.24)
Chloride: 98 mmol/L — ABNORMAL LOW (ref 101–111)
GFR calc Af Amer: 59 mL/min — ABNORMAL LOW (ref >60)
GFR, EST NON AFRICAN AMERICAN: 51 mL/min — AB (ref >60)
Glucose, Bld: 93 mg/dL (ref 65–99)
POTASSIUM: 3.6 mmol/L (ref 3.5–5.1)
SODIUM: 135 mmol/L (ref 135–145)

## 2016-01-31 LAB — VITAMIN B12: Vitamin B-12: 256 pg/mL (ref 180–914)

## 2016-01-31 LAB — MAGNESIUM: MAGNESIUM: 2 mg/dL (ref 1.7–2.4)

## 2016-01-31 LAB — FERRITIN: FERRITIN: 60 ng/mL (ref 24–336)

## 2016-01-31 LAB — PHOSPHORUS: Phosphorus: 2.8 mg/dL (ref 2.5–4.6)

## 2016-01-31 LAB — FOLATE: Folate: 13.4 ng/mL (ref >5.9)

## 2016-01-31 LAB — IRON AND TIBC
Iron: 22 ug/dL — ABNORMAL LOW (ref 45–182)
SATURATION RATIOS: 7 % — AB (ref 17.9–39.5)
TIBC: 326 ug/dL (ref 250–450)
UIBC: 304 ug/dL

## 2016-01-31 MED ORDER — VITAMIN B-12 1000 MCG PO TABS
1000.0000 ug | ORAL_TABLET | Freq: Every day | ORAL | Status: DC
  Administered 2016-01-31 – 2016-02-02 (×3): 1000 ug via ORAL
  Filled 2016-01-31 (×3): qty 1

## 2016-01-31 MED ORDER — SODIUM CHLORIDE 0.9 % IV BOLUS (SEPSIS): 500.0000 mL | Freq: Once | INTRAVENOUS | Status: DC

## 2016-01-31 NOTE — Progress Notes (Signed)
PT Cancellation Note  Patient Details Name: JT BRABEC MRN: [id number] DOB: 02/25/1941   Cancelled Treatment:    Reason Eval/Treat Not Completed: Patient's level of consciousness;Fatigue/lethargy limiting ability to participate. Chart reviewed, RN consulted. Holding pt treatment at this time due to patient disposition. Most recent CIWA score is 16, and RN reports he would not tolerate PT evaluation well at this time. Will attempt at later date/time as appropriate.    11:58 AM, 01/31/2016 Rosamaria Lints, PT, DPT PRN Physical Therapist at [facility] [place] License # [id number] [phone] (wireless)  [phone] (mobile)

## 2016-01-31 NOTE — Progress Notes (Signed)
[facility] PROGRESS NOTE  [name]:[id number] DOB: November 05, 1941 DOA: 01/29/2016 PCP: Fabio Neighbors  Assessment/Plan: 1-alcohol abuse with withdrawal: actively with CIWA score of 9-11 -continue CIWA -continue folic acid and thiamine  -unable to provide much counseling, due to somnolence and mild confusion -B12 level 256; will start B12 repletion  -Mg and phosphorus normal  2-fall and clavicle fracture: -sling in place -will use tylenol and PRN tramadol -will d/c IV narcotics and will change flexeril to PRN -PT has been asked to assess patient  3-GERD; will continue PPI  4-anemia: most 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 But patient on acute withdrawal.    Consultants:  None   Procedures:  See below for x-ray reports   Antibiotics:  None   HPI/Subjective: Afebrile, no CP, no nausea, no vomiting. Still Complaining of bilat shoulders pain. Patient is less tremulous and Oriented X 2. Per nursing staff CIWA score still elevated.   Objective: Filed Vitals:   01/31/16 1004 01/31/16 1433  BP: 120/62 107/53  Pulse:  59  Temp:  98.5 F (36.9 C)  Resp:  17    Intake/Output Summary (Last 24 hours) at 01/31/16 1458 Last data filed at 01/31/16 1300  Gross per 24 hour  Intake   1080 ml  Output    200 ml  Net    880 ml   Filed Weights   01/29/16 2053  Weight: 84.188 kg (185 lb 9.6 oz)    Exam:   General:  Less Tremulous and oriented X 2. Patient still complaining  of pain in his shoulders (but better). No fever, no nausea, no vomiting.  Cardiovascular: S1 and s2, no rubs or gallops  Respiratory: no wheezing, good air movement, no rales   Abdomen: soft, NT, ND, positive BS  Musculoskeletal: left upper ext on a sling, no LE edema appreciated, no cyanosis   Data Reviewed: Basic Metabolic Panel:  Recent Labs Lab 01/29/16 1520 01/30/16 0542 01/31/16 0615  NA 134* 136 135  K 3.1* 3.7 3.6  CL 96* 99* 98*  CO2 25 26 28   GLUCOSE 96 75 93  BUN 12 12 26*  CREATININE 0.98 0.97 1.34*  CALCIUM 8.5* 8.5* 8.5*  MG  --   --  2.0  PHOS  --   --  2.8   Liver Function Tests:  Recent Labs Lab 01/29/16 1520 01/30/16 0542  AST 52* 50*  ALT 34 32  ALKPHOS 59 60  BILITOT 0.6 1.3*  PROT 7.4 6.8  ALBUMIN 3.3* 3.1*   CBC:  Recent Labs Lab 01/29/16 1520 01/30/16 0542  WBC 7.2 6.3  NEUTROABS 5.1  --   HGB 8.7* 8.3*  HCT 27.6* 26.4*  MCV 77.3* 78.1  PLT 260 244   Studies: Dg Chest 1 View  01/29/2016  CLINICAL DATA:  Weakness.  Syncopal episodes. EXAM: CHEST 1 VIEW COMPARISON:  Left shoulder radiographs obtained earlier today. Chest radiographs dated 08/19/2012. FINDINGS: Decreased inspiration with no gross change in enlargement of the cardiac silhouette. The right subclavian pacer and  AICD leads are unchanged. Stable post CABG changes. Mild bibasilar atelectasis. Otherwise, clear lungs. Old, healed left rib fractures are again demonstrated. There is also a probable acute fracture of the lateral aspect of the left seventh rib. Previously described distal left clavicle fracture. IMPRESSION: 1. Previously described distal left clavicle fracture. 2. Probable acute left seventh rib fracture. 3. Decreased inspiration with mild bibasilar atelectasis. Electronically Signed   By: Beckie Salts M.D.   On: 01/29/2016 16:11   Ct Head Wo Contrast  01/29/2016  CLINICAL DATA:  75 year old who fell approximately 2 days ago and was unable to get up, lying in his yard  for several hours before he was found. Frequent falls at baseline, due to possible syncopal episodes. Current history of cerebrovascular disease with prior carotid endarterectomy. He presents now with acute onset of left-sided weakness which began earlier today. Initial encounter. EXAM: CT HEAD WITHOUT CONTRAST CT CERVICAL SPINE WITHOUT CONTRAST TECHNIQUE: Multidetector CT imaging of the head and cervical spine was performed following the standard protocol without intravenous contrast. Multiplanar CT image reconstructions of the cervical spine were also generated. COMPARISON:  Head CT 03/02/2013.  No prior cervical spine imaging. FINDINGS: CT HEAD FINDINGS Moderate age related cortical and deep atrophy, not significantly changed. Mild changes of small vessel disease of the white matter, unchanged. Dystrophic cortical calcification involving the right frontotemporal region, unchanged. No mass lesion. No midline shift. No acute hemorrhage or hematoma. No extra-axial fluid collections. No evidence of acute infarction. No skull fracture or other focal osseous abnormality involving the skull. Visualized paranasal sinuses, bilateral mastoid air cells and bilateral middle ear cavities well-aerated. Severe bilateral carotid siphon and vertebral artery atherosclerosis. CT CERVICAL SPINE FINDINGS The patient's head is rotated slightly to the right. No fractures identified involving the cervical spine. Sagittal reconstructed images demonstrate degenerative grade 1 spondylolisthesis of C3 on C4 approximating 4 mm and C4 on C5 approximating 2 mm. Facet joints intact throughout with degenerative changes, particularly on the left. Disc space narrowing at C3-4 and C5-6. Soft tissue window images suggest a broad-based disc protrusion or extrusion at C6-7. Coronal reformatted images demonstrate an intact craniocervical junction, intact C1-C2 articulation with degenerative changes, intact dens, and intact lateral masses throughout.  Uncinate and predominantly facet hypertrophy account for multilevel foraminal stenoses including moderate left C2-3, severe bilateral C3-4, severe bilateral C4-5, severe bilateral C5-6, moderate right C6-7 and mild right C7-T1. IMPRESSION: 1. No acute intracranial abnormality. 2. Stable moderate generalized atrophy and mild chronic microvascular ischemic changes the white matter. 3. No fractures identified involving the cervical spine. 4. Degenerative changes throughout the cervical spine as detailed above. 5. Possible broad-based central disc protrusion or extrusion at C6-7. If the patient has radicular symptoms, MRI of the cervical spine without contrast could be performed on a non urgent basis. Electronically Signed   By: Hulan Saas M.D.   On: 01/29/2016 16:37   Ct Cervical Spine Wo Contrast  01/29/2016  CLINICAL DATA:  75 year old who fell approximately 2 days ago and was unable to get up, lying in his yard for several hours before he was found. Frequent falls at baseline, due to possible syncopal episodes. Current history of cerebrovascular disease with prior carotid endarterectomy. He presents now with acute onset of left-sided weakness which began earlier today. Initial encounter. EXAM: CT HEAD WITHOUT CONTRAST CT CERVICAL SPINE WITHOUT CONTRAST TECHNIQUE: Multidetector CT imaging of the head and cervical spine was performed following the standard protocol without intravenous contrast. Multiplanar CT image reconstructions of the  cervical spine were also generated. COMPARISON:  Head CT 03/02/2013.  No prior cervical spine imaging. FINDINGS: CT HEAD FINDINGS Moderate age related cortical and deep atrophy, not significantly changed. Mild changes of small vessel disease of the white matter, unchanged. Dystrophic cortical calcification involving the right frontotemporal region, unchanged. No mass lesion. No midline shift. No acute hemorrhage or hematoma. No extra-axial fluid collections. No evidence of  acute infarction. No skull fracture or other focal osseous abnormality involving the skull. Visualized paranasal sinuses, bilateral mastoid air cells and bilateral middle ear cavities well-aerated. Severe bilateral carotid siphon and vertebral artery atherosclerosis. CT CERVICAL SPINE FINDINGS The patient's head is rotated slightly to the right. No fractures identified involving the cervical spine. Sagittal reconstructed images demonstrate degenerative grade 1 spondylolisthesis of C3 on C4 approximating 4 mm and C4 on C5 approximating 2 mm. Facet joints intact throughout with degenerative changes, particularly on the left. Disc space narrowing at C3-4 and C5-6. Soft tissue window images suggest a broad-based disc protrusion or extrusion at C6-7. Coronal reformatted images demonstrate an intact craniocervical junction, intact C1-C2 articulation with degenerative changes, intact dens, and intact lateral masses throughout. Uncinate and predominantly facet hypertrophy account for multilevel foraminal stenoses including moderate left C2-3, severe bilateral C3-4, severe bilateral C4-5, severe bilateral C5-6, moderate right C6-7 and mild right C7-T1. IMPRESSION: 1. No acute intracranial abnormality. 2. Stable moderate generalized atrophy and mild chronic microvascular ischemic changes the white matter. 3. No fractures identified involving the cervical spine. 4. Degenerative changes throughout the cervical spine as detailed above. 5. Possible broad-based central disc protrusion or extrusion at C6-7. If the patient has radicular symptoms, MRI of the cervical spine without contrast could be performed on a non urgent basis. Electronically Signed   By: Hulan Saas M.D.   On: 01/29/2016 16:37   Dg Shoulder Left  01/29/2016  CLINICAL DATA:  Left shoulder pain and bruising.  Probable fall. EXAM: LEFT SHOULDER - 2+ VIEW COMPARISON:  None. FINDINGS: Minimally comminuted oblique fracture of the distal clavicle with almost 1  shaft width of superior displacement of the distal fragment and overlapping of the fragments. The distal fragment maintains a normal relationship with the acromion and the coracoclavicular distance is narrowed. IMPRESSION: Distal clavicle fracture, as described above. Electronically Signed   By: Beckie Salts M.D.   On: 01/29/2016 16:06    Scheduled Meds: . amiodarone  200 mg Oral Daily  . aspirin EC  81 mg Oral Daily  . carvedilol  12.5 mg Oral BID WC  . enalapril  10 mg Oral Daily  . enoxaparin (LOVENOX) injection  40 mg Subcutaneous Q24H  . ezetimibe  10 mg Oral Daily  . folic acid  1 mg Oral Daily  . LORazepam  0-4 mg Intravenous Q6H   Followed by  . LORazepam  0-4 mg Intravenous Q12H  . magnesium oxide  400 mg Oral BID  . megestrol  600 mg Oral Daily  . multivitamin with minerals  1 tablet Oral Daily  . pantoprazole  40 mg Oral Daily  . potassium chloride SA  20 mEq Oral Daily  . sodium chloride  500 mL Intravenous Once  . sodium chloride flush  3 mL Intravenous Q12H  . spironolactone  25 mg Oral Daily  . thiamine  100 mg Oral Daily   Or  . thiamine  100 mg Intravenous Daily    Time spent:30 minutes    Vassie Loll MD  [facility] Pager [phone]. If 7PM-7AM, please contact night-coverage  at [email or web address], password [facility] 01/31/2016, 2:58 PM  LOS: 0 days

## 2016-02-01 DIAGNOSIS — D509 Iron deficiency anemia, unspecified: Secondary | ICD-10-CM

## 2016-02-01 DIAGNOSIS — D518 Other vitamin B12 deficiency anemias: Secondary | ICD-10-CM

## 2016-02-01 DIAGNOSIS — R5381 Other malaise: Secondary | ICD-10-CM

## 2016-02-01 MED ORDER — CARVEDILOL 3.125 MG PO TABS
6.2500 mg | ORAL_TABLET | Freq: Two times a day (BID) | ORAL | Status: DC
  Administered 2016-02-02: 6.25 mg via ORAL
  Filled 2016-02-01: qty 2

## 2016-02-01 MED ORDER — POLYSACCHARIDE IRON COMPLEX 150 MG PO CAPS
150.0000 mg | ORAL_CAPSULE | Freq: Two times a day (BID) | ORAL | Status: DC
  Administered 2016-02-01 – 2016-02-02 (×3): 150 mg via ORAL
  Filled 2016-02-01 (×3): qty 1

## 2016-02-01 NOTE — NC FL2 (Signed)
[place] MEDICAID FL2 LEVEL OF CARE SCREENING TOOL     IDENTIFICATION  Patient Name: [name] Birthdate: 1941-01-19 Sex: male Admission Date (Current Location): 01/29/2016  [facility] and [place] Number:  [employer] and Address:  [facility],  [address], [place] [address]      Provider Number: [id number]  Attending Physician Name and Address:  Vassie Loll, MD  Relative Name and Phone Number:       Current Level of Care: Hospital Recommended Level of Care: Skilled Nursing Facility Prior Approval Number:    Date Approved/Denied:   PASRR Number: [id number]  Discharge Plan: SNF    Current Diagnoses: Patient Active Problem List   Diagnosis Date Noted  . Alcohol withdrawal (HCC) 01/31/2016  . Left-sided weakness 01/29/2016  . Neck pain 01/29/2016  . Clavicle fracture 01/29/2016  . H/O aortic valve replacement with porcine valve 06/04/2014  . 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 CRT-D [employer] 07/12/2012  . HYPOTHYROIDISM 03/14/2009  . Hyperlipidemia 03/14/2009  . GOUT 03/14/2009  . ALCOHOLISM 03/14/2009  . Essential hypertension 03/14/2009  . Cardiomyopathy, ischemic 03/14/2009  . CEREBROVASCULAR DISEASE 03/14/2009  . Peripheral vascular disease (HCC) 03/14/2009  . RENAL INSUFFICIENCY, CHRONIC 03/14/2009  . ANEMIA, HX OF 03/14/2009  . CAROTID ENDARTERECTOMY, LEFT, HX OF 03/14/2009  . CORONARY ARTERY BYPASS GRAFT, HX OF 03/14/2009  . CAD s/p redo CABG 01/17/2009  . ATRIAL FIBRILLATION 01/17/2009  . COMBINED HEART FAILURE, CHRONIC 01/17/2009    Orientation RESPIRATION BLADDER Height & Weight     Self, Time  O2 (1.5 L) Continent Weight: 185 lb 9.6 oz (84.188 kg) Height:   (185.4 cm)  BEHAVIORAL SYMPTOMS/MOOD NEUROLOGICAL BOWEL NUTRITION STATUS      Continent  (HEART HEALTHY)  AMBULATORY STATUS COMMUNICATION OF NEEDS Skin   Limited Assist Verbally Normal                       Personal Care  Assistance Level of Assistance  Bathing, Dressing Bathing Assistance: Limited assistance   Dressing Assistance: Limited assistance     Functional Limitations Info             SPECIAL CARE FACTORS FREQUENCY  PT (By licensed PT), OT (By licensed OT)     PT Frequency: 5 OT Frequency: 5            Contractures      Additional Factors Info  Code Status Code Status Info: FULL CODE             Current Medications (02/01/2016):  This is the current hospital active medication list Current Facility-Administered Medications  Medication Dose Route Frequency Provider Last Rate Last Dose  . 0.9 %  sodium chloride infusion  250 mL Intravenous PRN Meredeth Ide, MD      . acetaminophen (TYLENOL) tablet 650 mg  650 mg Oral Q6H PRN Vassie Loll, MD      . amiodarone (PACERONE) tablet 200 mg  200 mg Oral Daily Meredeth Ide, MD   200 mg at 01/31/16 1007  . aspirin EC tablet 81 mg  81 mg Oral Daily Meredeth Ide, MD   81 mg at 01/31/16 1004  . carvedilol (COREG) tablet 12.5 mg  12.5 mg Oral BID WC Meredeth Ide, MD   12.5 mg at 01/31/16 1701  . cyclobenzaprine (FLEXERIL) tablet 10 mg  10 mg Oral TID PRN Vassie Loll, MD      . enalapril (VASOTEC)  tablet 10 mg  10 mg Oral Daily Meredeth Ide, MD   10 mg at 01/31/16 1004  . enoxaparin (LOVENOX) injection 40 mg  40 mg Subcutaneous Q24H Meredeth Ide, MD   40 mg at 01/31/16 2014  . ezetimibe (ZETIA) tablet 10 mg  10 mg Oral Daily Meredeth Ide, MD   10 mg at 01/31/16 1004  . folic acid (FOLVITE) tablet 1 mg  1 mg Oral Daily Meredeth Ide, MD   1 mg at 01/31/16 1003  . LORazepam (ATIVAN) injection 0-4 mg  0-4 mg Intravenous Q12H Meredeth Ide, MD   2 mg at 01/31/16 2228  . LORazepam (ATIVAN) tablet 1 mg  1 mg Oral Q6H PRN Meredeth Ide, MD       Or  . LORazepam (ATIVAN) injection 1 mg  1 mg Intravenous Q6H PRN Meredeth Ide, MD   1 mg at 01/31/16 2014  . magnesium oxide (MAG-OX) tablet 400 mg  400 mg Oral BID Meredeth Ide, MD   400 mg at 01/31/16  2014  . megestrol (MEGACE) 40 MG/ML suspension 600 mg  600 mg Oral Daily Meredeth Ide, MD   600 mg at 01/31/16 1411  . multivitamin with minerals tablet 1 tablet  1 tablet Oral Daily Meredeth Ide, MD   1 tablet at 01/31/16 1008  . ondansetron (ZOFRAN) tablet 4 mg  4 mg Oral Q6H PRN Meredeth Ide, MD       Or  . ondansetron (ZOFRAN) injection 4 mg  4 mg Intravenous Q6H PRN Meredeth Ide, MD      . pantoprazole (PROTONIX) EC tablet 40 mg  40 mg Oral Daily Vassie Loll, MD   40 mg at 01/31/16 1003  . potassium chloride SA (K-DUR,KLOR-CON) CR tablet 20 mEq  20 mEq Oral Daily Meredeth Ide, MD   20 mEq at 01/31/16 1009  . sodium chloride 0.9 % bolus 500 mL  500 mL Intravenous Once Vassie Loll, MD   500 mL at 01/31/16 1022  . sodium chloride flush (NS) 0.9 % injection 3 mL  3 mL Intravenous Q12H Meredeth Ide, MD   3 mL at 01/31/16 2228  . sodium chloride flush (NS) 0.9 % injection 3 mL  3 mL Intravenous PRN Meredeth Ide, MD   1 mL at 01/31/16 1013  . spironolactone (ALDACTONE) tablet 25 mg  25 mg Oral Daily Meredeth Ide, MD   25 mg at 01/31/16 1003  . thiamine (VITAMIN B-1) tablet 100 mg  100 mg Oral Daily Meredeth Ide, MD   100 mg at 01/31/16 1011   Or  . thiamine (B-1) injection 100 mg  100 mg Intravenous Daily Meredeth Ide, MD      . traMADol Janean Sark) tablet 50 mg  50 mg Oral Q6H PRN Vassie Loll, MD   50 mg at 01/31/16 2014  . vitamin B-12 (CYANOCOBALAMIN) tablet 1,000 mcg  1,000 mcg Oral Daily Vassie Loll, MD   1,000 mcg at 01/31/16 1701     Discharge Medications: Please see discharge summary for a list of discharge medications.  Relevant Imaging Results:  Relevant Lab Results:   Additional Information SSN [id number]  Liliana Cline, LCSW

## 2016-02-01 NOTE — Progress Notes (Signed)
[facility] PROGRESS NOTE  [name]:[id number] DOB: March 06, 1941 DOA: 01/29/2016 PCP: Fabio Neighbors  Assessment/Plan: 1-alcohol abuse with uncomplicated withdrawal: actively with CIWA score of < 9 -continue CIWA -continue folic acid and thiamine  -unable to provide much counseling, due to somnolence and mild confusion -B12 level 256; will continue B12 repletion  -Mg and phosphorus normal  2-fall and clavicle fracture: -sling in place -will use tylenol and PRN tramadol -will d/c IV narcotics and will change flexeril to PRN -PT has been asked to assess patient and will need SNF  3-GERD; will continue PPI  4-anemia: most likely associated to AOCD and alcohol consumption  -anemia panel suggesting component of iron and B12 deficiency as well   -no signs of bleeding  -will add niferex BID -continue B12 supplementation   5-hx of atrial fibrillation  -continue amiodarone and ASA -not a candidate for anticoagulation due to falls and alcohol use -CHADsVASC 3  6-HTN: will continue current antihypertensive regimen -BP is overall stable and well controlled  7-HLD: continue zetia  Code Status: Full Family Communication: no family at bedside  Disposition Plan: to be determine; currently unable to do much by himself when assessed by PT. But patient on acute withdrawal.    Consultants:  None   Procedures:  See below for x-ray reports   Antibiotics:  None   HPI/Subjective: Afebrile, no CP, no nausea, no vomiting. Still Complaining of bilat shoulders pain. Patient is less tremulous and Oriented X 2 overall. Per nursing staff CIWA score is less elevated. Still confuse and with physical limitations/poor judgement.   Objective: Filed Vitals:   02/01/16 0842 02/01/16 0958  BP:    Pulse: 66 76  Temp:    Resp:      Intake/Output Summary (Last 24 hours) at 02/01/16 1148 Last data filed at 02/01/16 0613  Gross per 24 hour  Intake    720 ml  Output   4000  ml  Net  -3280 ml   Filed Weights   01/29/16 2053  Weight: 84.188 kg (185 lb 9.6 oz)    Exam:   General:  A lot less Tremulous and oriented X 2. Patient still complaining of pain in his shoulders (but better). No fever, no nausea, no vomiting. Oriented to person, knows who is the president and knew the year. Confused about context and need for admission and thought [place] was in a farm.  Cardiovascular: S1 and S2, no rubs or gallops  Respiratory: no wheezing, good air movement, no rales   Abdomen: soft, NT, ND, positive BS  Musculoskeletal: left upper ext on a sling, no LE edema appreciated, no cyanosis   Data Reviewed: Basic Metabolic Panel:  Recent Labs Lab 01/29/16 1520 01/30/16 0542 01/31/16 0615  NA 134* 136 135  K 3.1* 3.7 3.6  CL 96* 99* 98*  CO2 GLUCOSE 96 75 93  BUN 12 12 26*  CREATININE 0.98 0.97 1.34*  CALCIUM 8.5* 8.5* 8.5*  MG  --   --  2.0  PHOS  --   --  2.8   Liver Function Tests:  Recent Labs Lab 01/29/16 1520 01/30/16 0542  AST 52* 50*  ALT 34 32  ALKPHOS 59 60  BILITOT 0.6 1.3*  PROT 7.4 6.8  ALBUMIN 3.3* 3.1*   CBC:  Recent Labs Lab 01/29/16 1520 01/30/16 0542  WBC 7.2 6.3  NEUTROABS 5.1  --   HGB 8.7* 8.3*  HCT 27.6* 26.4*  MCV 77.3* 78.1  PLT 260 244   Studies: No results found.  Scheduled Meds: . amiodarone  200 mg Oral Daily  . aspirin EC  81 mg Oral Daily  . carvedilol  12.5 mg Oral BID WC  . enalapril  10 mg Oral Daily  . enoxaparin (LOVENOX) injection  40 mg Subcutaneous Q24H  . ezetimibe  10 mg Oral Daily  . folic acid  1 mg Oral Daily  . LORazepam  0-4 mg Intravenous Q12H  . magnesium oxide  400 mg Oral BID  . megestrol  600 mg Oral Daily  . multivitamin with minerals  1 tablet Oral Daily  . pantoprazole  40 mg Oral Daily  . potassium chloride SA  20 mEq Oral Daily  . sodium chloride  500 mL Intravenous Once  . sodium chloride flush  3 mL Intravenous Q12H  . spironolactone  25 mg Oral Daily  .  thiamine  100 mg Oral Daily   Or  . thiamine  100 mg Intravenous Daily  . vitamin B-12  1,000 mcg Oral Daily    Time spent: 30 minutes    Vassie Loll MD  [facility] Pager [phone]. If 7PM-7AM, please contact night-coverage at [email or web address], password [facility] 02/01/2016, 11:48 AM  LOS: 1 day

## 2016-02-01 NOTE — [place] FL2 (Deleted)
[place] MEDICAID FL2 LEVEL OF CARE SCREENING TOOL     IDENTIFICATION  Patient Name: [name] Birthdate: Jan 15, 1941 Sex: male Admission Date (Current Location): 01/29/2016  [facility] and [place] Number:  [employer] and Address:  [facility],  [address], [place] [address]      Provider Number: [id number]  Attending Physician Name and Address:  Vassie Loll, MD  Relative Name and Phone Number:       Current Level of Care: Hospital Recommended Level of Care: Skilled Nursing Facility Prior Approval Number:    Date Approved/Denied:   PASRR Number:    Discharge Plan: SNF    Current Diagnoses: Patient Active Problem List   Diagnosis Date Noted  . Alcohol withdrawal (HCC) 01/31/2016  . Left-sided weakness 01/29/2016  . Neck pain 01/29/2016  . Clavicle fracture 01/29/2016  . H/O aortic valve replacement with porcine valve 06/04/2014  . 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 CRT-D [employer] 07/12/2012  . HYPOTHYROIDISM 03/14/2009  . Hyperlipidemia 03/14/2009  . GOUT 03/14/2009  . ALCOHOLISM 03/14/2009  . Essential hypertension 03/14/2009  . Cardiomyopathy, ischemic 03/14/2009  . CEREBROVASCULAR DISEASE 03/14/2009  . Peripheral vascular disease (HCC) 03/14/2009  . RENAL INSUFFICIENCY, CHRONIC 03/14/2009  . ANEMIA, HX OF 03/14/2009  . CAROTID ENDARTERECTOMY, LEFT, HX OF 03/14/2009  . CORONARY ARTERY BYPASS GRAFT, HX OF 03/14/2009  . CAD s/p redo CABG 01/17/2009  . ATRIAL FIBRILLATION 01/17/2009  . COMBINED HEART FAILURE, CHRONIC 01/17/2009    Orientation RESPIRATION BLADDER Height & Weight     Self, Time  O2 (1.5 L) Continent Weight: 185 lb 9.6 oz (84.188 kg) Height:   (185.4 cm)  BEHAVIORAL SYMPTOMS/MOOD NEUROLOGICAL BOWEL NUTRITION STATUS      Continent  (HEART HEALTHY)  AMBULATORY STATUS COMMUNICATION OF NEEDS Skin   Limited Assist Verbally Normal                       Personal Care Assistance  Level of Assistance  Bathing, Dressing Bathing Assistance: Limited assistance   Dressing Assistance: Limited assistance     Functional Limitations Info             SPECIAL CARE FACTORS FREQUENCY  PT (By licensed PT), OT (By licensed OT)                    Contractures      Additional Factors Info  Code Status Code Status Info: FULL CODE             Current Medications (02/01/2016):  This is the current hospital active medication list Current Facility-Administered Medications  Medication Dose Route Frequency Provider Last Rate Last Dose  . 0.9 %  sodium chloride infusion  250 mL Intravenous PRN Meredeth Ide, MD      . acetaminophen (TYLENOL) tablet 650 mg  650 mg Oral Q6H PRN Vassie Loll, MD      . amiodarone (PACERONE) tablet 200 mg  200 mg Oral Daily Meredeth Ide, MD   200 mg at 01/31/16 1007  . aspirin EC tablet 81 mg  81 mg Oral Daily Meredeth Ide, MD   81 mg at 01/31/16 1004  . carvedilol (COREG) tablet 12.5 mg  12.5 mg Oral BID WC Meredeth Ide, MD   12.5 mg at 01/31/16 1701  . cyclobenzaprine (FLEXERIL) tablet 10 mg  10 mg Oral TID PRN Vassie Loll, MD      . enalapril (VASOTEC) tablet  10 mg  10 mg Oral Daily Meredeth Ide, MD   10 mg at 01/31/16 1004  . enoxaparin (LOVENOX) injection 40 mg  40 mg Subcutaneous Q24H Meredeth Ide, MD   40 mg at 01/31/16 2014  . ezetimibe (ZETIA) tablet 10 mg  10 mg Oral Daily Meredeth Ide, MD   10 mg at 01/31/16 1004  . folic acid (FOLVITE) tablet 1 mg  1 mg Oral Daily Meredeth Ide, MD   1 mg at 01/31/16 1003  . LORazepam (ATIVAN) injection 0-4 mg  0-4 mg Intravenous Q12H Meredeth Ide, MD   2 mg at 01/31/16 2228  . LORazepam (ATIVAN) tablet 1 mg  1 mg Oral Q6H PRN Meredeth Ide, MD       Or  . LORazepam (ATIVAN) injection 1 mg  1 mg Intravenous Q6H PRN Meredeth Ide, MD   1 mg at 01/31/16 2014  . magnesium oxide (MAG-OX) tablet 400 mg  400 mg Oral BID Meredeth Ide, MD   400 mg at 01/31/16 2014  . megestrol (MEGACE) 40 MG/ML  suspension 600 mg  600 mg Oral Daily Meredeth Ide, MD   600 mg at 01/31/16 1411  . multivitamin with minerals tablet 1 tablet  1 tablet Oral Daily Meredeth Ide, MD   1 tablet at 01/31/16 1008  . ondansetron (ZOFRAN) tablet 4 mg  4 mg Oral Q6H PRN Meredeth Ide, MD       Or  . ondansetron (ZOFRAN) injection 4 mg  4 mg Intravenous Q6H PRN Meredeth Ide, MD      . pantoprazole (PROTONIX) EC tablet 40 mg  40 mg Oral Daily Vassie Loll, MD   40 mg at 01/31/16 1003  . potassium chloride SA (K-DUR,KLOR-CON) CR tablet 20 mEq  20 mEq Oral Daily Meredeth Ide, MD   20 mEq at 01/31/16 1009  . sodium chloride 0.9 % bolus 500 mL  500 mL Intravenous Once Vassie Loll, MD   500 mL at 01/31/16 1022  . sodium chloride flush (NS) 0.9 % injection 3 mL  3 mL Intravenous Q12H Meredeth Ide, MD   3 mL at 01/31/16 2228  . sodium chloride flush (NS) 0.9 % injection 3 mL  3 mL Intravenous PRN Meredeth Ide, MD   1 mL at 01/31/16 1013  . spironolactone (ALDACTONE) tablet 25 mg  25 mg Oral Daily Meredeth Ide, MD   25 mg at 01/31/16 1003  . thiamine (VITAMIN B-1) tablet 100 mg  100 mg Oral Daily Meredeth Ide, MD   100 mg at 01/31/16 1011   Or  . thiamine (B-1) injection 100 mg  100 mg Intravenous Daily Meredeth Ide, MD      . traMADol Janean Sark) tablet 50 mg  50 mg Oral Q6H PRN Vassie Loll, MD   50 mg at 01/31/16 2014  . vitamin B-12 (CYANOCOBALAMIN) tablet 1,000 mcg  1,000 mcg Oral Daily Vassie Loll, MD   1,000 mcg at 01/31/16 1701     Discharge Medications: Please see discharge summary for a list of discharge medications.  Relevant Imaging Results:  Relevant Lab Results:   Additional Information SSN [id number]  Liliana Cline, LCSW

## 2016-02-01 NOTE — Progress Notes (Signed)
CSW is working on SNF options- he is currently with sitter at bedside which will deter offers. RNCM, RN and MD advised. Will work on SNF offers for selection and potential [employer].  [name], MSW, Theresia Majors  [phone]

## 2016-02-01 NOTE — Progress Notes (Signed)
SNF bed offers rec'd- family selects [facility]- will plan dc for tomorrow given he has had sitter at bedside today. Wife and daughter agreeable and appreciative of help.  Reece Levy, MSW, Theresia Majors  [phone]

## 2016-02-01 NOTE — Progress Notes (Signed)
Dr. Gwenlyn Perking notified of patient's blood pressure and heart rate with Coreg 12.5 mg due.  Dr. Gwenlyn Perking gave order to hold the 1700 dose of Coreg this evening.  Orders followed.

## 2016-02-01 NOTE — Plan of Care (Signed)
Problem: Acute Rehab PT Goals(only PT should resolve) Goal: Pt Will Go Supine/Side To Sit Pt will demonstrate ModI bed mobility supine to sitting edge-of-bed to return to PLOF and to decrease caregiver burden.       Goal: Patient Will Transfer Sit To/From Stand Pt will transfer sit to/from-stand with SPC at ModI without loss-of-balance to demonstrate good safety awareness for independent mobility in home.     Goal: Pt Will Ambulate Pt will ambulate with SPC at Supervision using a step-through pattern and equal step length for a distance greater than 286ft to demonstrate the ability to perform safe household distance ambulation at discharge.

## 2016-02-01 NOTE — Clinical Documentation Improvement (Signed)
Internal Medicine  Can the diagnosis of alcohol abuse  be further specified?   Specify intoxication/withdrawal as "Uncomplicated" or "With delirium" with symptoms, other related/associated conditions  List blood alcohol level if available  State "no related complications" when applicable  Other  Clinically Undetermined   Supporting Information: alcohol abuse with withdrawal:  actively with CIWA score of 9-11-continue CIWA -continue folic acid and thiamine  -unable to provide much counseling, due to somnolence and mild confusion -B12 level 256; will start B12 repletion   01/31/2016 PT eval: Holding pt treatment at this time due to patient disposition. Most recent CIWA score is 16  01/31/2016 nursing flow sheet: CIWA total 15  Please exercise your independent, professional judgment when responding. A specific answer is not anticipated or expected. Please update your documentation within the medical record to reflect your response to this query. Thank you  Thank Barrie Dunker Health Information Management [place] [phone]

## 2016-02-01 NOTE — Clinical Social Work Placement (Signed)
   CLINICAL SOCIAL WORK PLACEMENT  NOTE  Date:  02/01/2016  Patient Details  Name: [name] MRN: [id number] Date of Birth: 06-24-1941  Clinical Social Work is seeking post-discharge placement for this patient at the Skilled  Nursing Facility level of care (*CSW will initial, date and re-position this form in  chart as items are completed):  Yes   Patient/family provided with [facility] Clinical Social Work Department's list of facilities offering this level of care within the geographic area requested by the patient (or if unable, by the patient's family).  Yes   Patient/family informed of their freedom to choose among providers that offer the needed level of care, that participate in Medicare, Medicaid or managed care program needed by the patient, have an available bed and are willing to accept the patient.  Yes   Patient/family informed of [facility]'s ownership interest in [facility] and [facility], as well as of the fact that they are under no obligation to receive care at these facilities.  PASRR submitted to EDS on 02/01/16     PASRR number received on 02/01/16     Existing PASRR number confirmed on       FL2 transmitted to all facilities in geographic area requested by pt/family on 02/01/16     FL2 transmitted to all facilities within larger geographic area on       Patient informed that his/her managed care company has contracts with or will negotiate with certain facilities, including the following:            Patient/family informed of bed offers received.  Patient chooses bed at       Physician recommends and patient chooses bed at      Patient to be transferred to   on  .  Patient to be transferred to facility by       Patient family notified on   of transfer.  Name of family member notified:        PHYSICIAN       Additional Comment:    _______________________________________________ Liliana Cline, LCSW 02/01/2016, 10:44  AM

## 2016-02-01 NOTE — Evaluation (Signed)
Physical Therapy Evaluation Patient Details Name: [name] MRN: [id number] DOB: [birth year] Today's Date: 02/01/2016   History of Present Illness  75yo white male who comes to [facility] on 2/7 after acute onset difficulty walking and LUE pain/weakkness upon waking on couch that morning. PMH: CP, PVD, HLD, HTN, ischemic CM, MI, and PPM. Hs inclused ETOH abuse and pt is believed to have sustained a fall the night PTA, but is unabel to account for bruises or activity. Imaging this visit reveals L distal clavicular fracture as well as discopathy at the C6/7 level. PT orders were put in soon after admission, but patient has been in detox with elevated CIWA scores. Today, patient remains somnolent and confused, but is willing to participate.  At baseline pt ambulates with a SPC in RUE, but no additonal subjective infor is able to be obtained at this time.   Clinical Impression  Pt received asleep in bed with sitter in room. Pt awakens with moderate verbal stimulation, but remains somnolent throughout session, falling asleep at least 3 times durign session, and generally having difficulty maintaining eyes open. Pt is oriented to person and place only. Pt is difficult to understand at times, and mumbles most of session in such a way is incoherent or not appropriate to current situation.  Pt demonstrates impaired memory, unabel to answer questions about history consistently. During mobility assessment pt is noted to have mild intention tremor in RUE during [facility] use at stance, which is significantly improved from attempted evaluation 2DA. Functional mobility assessment reveals global weakness, wherein pt requires Max to Total assistance for OOB and attempted gait; however pt is unable to ambulated despite total assistance for weight shifting. RUE is positive for dysmetria, poor gross/fine motor coordination, and intention tremor, which viewed in light of ETOH history and wide based stance, reveal suspicion for ETOH  related cerebella dysfunction. At DC pt will require physical assistance for all functional mobility, ADL, and IADL. I will obtain additional baseline info from family, but suspect patient's ability to follow commands will continue to improve in time, and pt will be successful with PT skilled services to restore to PLOF.      Follow Up Recommendations SNF;Supervision/Assistance - 24 hour;Supervision for mobility/OOB    Equipment Recommendations  None recommended by PT    Recommendations for Other Services       Precautions / Restrictions Precautions Precautions: None Precaution Comments: acute L clavicular fracture, wearing safety mittens upon entry and exit.  Required Braces or Orthoses: Sling Restrictions Weight Bearing Restrictions: No      Mobility  Bed Mobility Overal bed mobility: Needs Assistance Bed Mobility: Supine to Sit;Sit to Supine     Supine to sit: Max assist Sit to supine: Max assist   General bed mobility comments: inconsistently follows simple verbal/visual cues; at EOB, pt falls asleep and begins to fall backward onto bed, requiring total A from PT to avoid falling.   Transfers Overall transfer level: Needs assistance Equipment used: Straight cane Transfers: Sit to/from Stand Sit to Stand: Max assist         General transfer comment: requires heavy assistance to stand; very unstable upon stance, trying to grab things with LUE in spite of sling.    Ambulation/Gait Ambulation/Gait assistance:  (unable to attempt in spite of heavy phsyical assistance. )              Stairs            Wheelchair Mobility    Modified  Rankin (Stroke Patients Only)       Balance                                             Pertinent Vitals/Pain Pain Assessment: No/denies pain    Home Living Family/patient expects to be discharged to::  (unable to obtain at this time; will contact family. )               Home Equipment:  [employer] [employer] - single point      Prior Function                 Hand Dominance   Dominant Hand: Right    Extremity/Trunk Assessment   Upper Extremity Assessment: Generalized weakness;RUE deficits/detail;LUE deficits/detail RUE Deficits / Details: poor gross motor coordination and dysmetria.      LUE Deficits / Details: Not fully evaluated due to fracture.    Lower Extremity Assessment: Generalized weakness         Communication      Cognition Arousal/Alertness: Lethargic;Suspect due to medications Behavior During Therapy:  (confused. ) Overall Cognitive Status: No family/caregiver present to determine baseline cognitive functioning       Memory: Decreased recall of precautions;Decreased short-term memory (no awareness of L shoulder fracture, despite having discussed this with patient 2DA)              General Comments      Exercises        Assessment/Plan    PT Assessment Patient needs continued PT services  PT Diagnosis Difficulty walking;Abnormality of gait;Generalized weakness;Altered mental status   PT Problem List Decreased strength;Decreased range of motion;Decreased activity tolerance;Decreased balance;Decreased mobility;Decreased coordination;Decreased cognition;Cardiopulmonary status limiting activity;Decreased safety awareness  PT Treatment Interventions Gait training;Functional mobility training;Therapeutic activities;Therapeutic exercise;Balance training;Patient/family education   PT Goals (Current goals can be found in the Care Plan section) Acute Rehab PT Goals PT Goal Formulation: Patient unable to participate in goal setting    Frequency Min 3X/week   Barriers to discharge Inaccessible home environment;Decreased caregiver support      Co-evaluation               End of Session Equipment Utilized During Treatment: Gait belt;Other (comment) (LUe sling. ) Activity Tolerance: Patient tolerated treatment well;Patient limited by  lethargy;Patient limited by fatigue Patient left: in bed;with bed alarm set;with nursing/sitter in room Nurse Communication: Mobility status;Other (comment)         Time: 1610-9604 PT Time Calculation (min) (ACUTE ONLY): 14 min   Charges:   PT Evaluation $PT Eval High Complexity: 1 Procedure PT Treatments $Therapeutic Activity: 8-22 mins   PT G Codes:        10:17 AM, 27-Feb-2016 Rosamaria Lints, PT, DPT PRN Physical Therapist at [facility] License # [id number] [phone] (wireless)  [phone] (mobile)

## 2016-02-01 NOTE — Clinical Social Work Note (Signed)
Clinical Social Work Assessment  Patient Details  Name: [name] MRN: [id number] Date of Birth: 07/16/1941  Date of referral:  02/01/16               Reason for consult:  Facility Placement                Permission sought to share information with:  Family Supports Permission granted to share information::     Name::      (daughter and wife)  Agency::     Relationship::     Contact Information:     Housing/Transportation Living arrangements for the past 2 months:  Single Family Home Source of Information:  Adult Children, Spouse Patient Interpreter Needed:  None Criminal Activity/Legal Involvement Pertinent to Current Situation/Hospitalization:  No - Comment as needed Significant Relationships:  Spouse Lives with:  Spouse Do you feel safe going back to the place where you live?  No Need for family participation in patient care:  Yes (Comment)  Care giving concerns:  SNF need at dc-    Office manager / plan:  Spoke with patient who is somewhat confused- I have also spoken with wife and she agrees to SNF.   Employment status:  Retired Health and safety inspector:    PT Recommendations:    Information / Referral to community resources:  Skilled Nursing Facility  Patient/Family's Response to care:  Agreeable to SNF  Patient/Family's Understanding of and Emotional Response to Diagnosis, Current Treatment, and Prognosis:  Wife wants to hear more about his medical condition and I have provided her with the # for unit/RN.   Emotional Assessment Appearance:  Appears older than stated age Attitude/Demeanor/Rapport:    Affect (typically observed):  Accepting Orientation:  Oriented to Self, Fluctuating Orientation (Suspected and/or reported Sundowners) Alcohol / Substance use:  Alcohol Use Psych involvement (Current and /or in the community):  No (Comment)  Discharge Needs  Concerns to be addressed:    Readmission within the last 30 days:    Current discharge risk:   None Barriers to Discharge:      Liliana Cline, LCSW 02/01/2016, 10:40 AM

## 2016-02-02 DIAGNOSIS — I5042 Chronic combined systolic (congestive) and diastolic (congestive) heart failure: Secondary | ICD-10-CM

## 2016-02-02 DIAGNOSIS — K219 Gastro-esophageal reflux disease without esophagitis: Secondary | ICD-10-CM | POA: Insufficient documentation

## 2016-02-02 DIAGNOSIS — I482 Chronic atrial fibrillation: Secondary | ICD-10-CM

## 2016-02-02 DIAGNOSIS — E785 Hyperlipidemia, unspecified: Secondary | ICD-10-CM | POA: Insufficient documentation

## 2016-02-02 DIAGNOSIS — I480 Paroxysmal atrial fibrillation: Secondary | ICD-10-CM | POA: Insufficient documentation

## 2016-02-02 DIAGNOSIS — D519 Vitamin B12 deficiency anemia, unspecified: Secondary | ICD-10-CM

## 2016-02-02 LAB — BASIC METABOLIC PANEL
Anion gap: 10 (ref 5–15)
BUN: 30 mg/dL — AB (ref 6–20)
CHLORIDE: 104 mmol/L (ref 101–111)
CO2: 25 mmol/L (ref 22–32)
CREATININE: 1.21 mg/dL (ref 0.61–1.24)
Calcium: 8.9 mg/dL (ref 8.9–10.3)
GFR calc Af Amer: >60 mL/min (ref >60)
GFR calc non Af Amer: 58 mL/min — ABNORMAL LOW (ref >60)
Glucose, Bld: 89 mg/dL (ref 65–99)
Potassium: 3.8 mmol/L (ref 3.5–5.1)
Sodium: 139 mmol/L (ref 135–145)

## 2016-02-02 MED ORDER — FUROSEMIDE 40 MG PO TABS: 40.0000 mg | ORAL_TABLET | Freq: Every day | ORAL | Status: DC

## 2016-02-02 MED ORDER — ACETAMINOPHEN 325 MG PO TABS: 650.0000 mg | ORAL_TABLET | Freq: Four times a day (QID) | ORAL | Status: DC | PRN

## 2016-02-02 MED ORDER — TRAMADOL HCL 50 MG PO TABS: 50.0000 mg | ORAL_TABLET | Freq: Four times a day (QID) | ORAL | Status: DC | PRN

## 2016-02-02 MED ORDER — THIAMINE HCL 100 MG PO TABS: 100.0000 mg | ORAL_TABLET | Freq: Every day | ORAL | Status: DC

## 2016-02-02 MED ORDER — CYCLOBENZAPRINE HCL 10 MG PO TABS: 10.0000 mg | ORAL_TABLET | Freq: Three times a day (TID) | ORAL | Status: DC | PRN

## 2016-02-02 MED ORDER — VITAMIN B-12 1000 MCG PO TABS: 1000.0000 ug | ORAL_TABLET | Freq: Every day | ORAL | Status: DC

## 2016-02-02 MED ORDER — POLYSACCHARIDE IRON COMPLEX 150 MG PO CAPS: 150.0000 mg | ORAL_CAPSULE | Freq: Two times a day (BID) | ORAL | Status: DC

## 2016-02-02 MED ORDER — LORAZEPAM 0.5 MG PO TABS: 0.5000 mg | ORAL_TABLET | Freq: Two times a day (BID) | ORAL | Status: DC | PRN

## 2016-02-02 MED ORDER — CARVEDILOL 6.25 MG PO TABS: 6.2500 mg | ORAL_TABLET | Freq: Two times a day (BID) | ORAL | Status: DC

## 2016-02-02 MED ORDER — PANTOPRAZOLE SODIUM 40 MG PO TBEC: 40.0000 mg | DELAYED_RELEASE_TABLET | Freq: Every day | ORAL | Status: DC

## 2016-02-02 MED ORDER — FOLIC ACID 1 MG PO TABS: 1.0000 mg | ORAL_TABLET | Freq: Every day | ORAL | Status: DC

## 2016-02-02 NOTE — Discharge Summary (Signed)
Physician Discharge Summary  [name] ZOX:[id number] DOB: 1941-04-09 DOA: 01/29/2016  PCP: Fabio Neighbors  Admit date: 01/29/2016 Discharge date: 02/02/2016  Time spent: 35 minutes  Recommendations for Outpatient Follow-up:  1. Repeat in 5 days basic metabolic panel to follow electrolytes and renal function 2. Repeat CBC in 5 days to follow hemoglobin trend 3. Reassess blood pressure and adjust antihypertensive regimen as needed (patient carvedilol was recently caught in half secondary to bradycardia) 4. Close follow-up of patient's Volume status   Discharge Diagnoses:  Active Problems:   Left-sided weakness   Neck pain   Clavicle fracture   Alcohol withdrawal (HCC)   Discharge Condition: Stable and improved. Patient will be discharged to [facility] nursing facility for physical rehabilitation and further care. Will recommend follow-up with PCP in approximately 2 weeks after discharge from facility.  Diet recommendation: Heart healthy diet (2 gram of sodium per day or less)  Filed Weights   01/29/16 2053  Weight: 84.188 kg (185 lb 9.6 oz)    History of present illness:  75 year old male with a past medical history significant for hypertension, hyperlipidemia, atrial flutter/chronic A. fib (not on anticoagulation), chronic combined CHF, anxiety and alcohol abuse; who presented to the emergency department secondary to pain of his left arm and difficulty walking. Patient was initially seen the prior night due to alcohol intoxication and some difficulty ambulating; at that time he refused treatment and went home. He has not returned with inability to walk and complaining of left upper extremity pain. On physical exam and was found to have bruises on his left chest and also on his left upper extremity and a x-ray done demonstrated left distal clavicle fracture. Patient was also found to have mild signs of dehydration and alcohol withdrawal symptoms. He was admitted for further  evaluation and treatment.  Hospital Course:  1-alcohol abuse with uncomplicated withdrawals: actively with CIWA score of 7 -Patient has been now over 24 hours without needs for Sitter or Ativan -Encourage to quit drinking -Will continue thiamine and folic acid  -B12 level 256; will continue B12 supplementation   -Mg and phosphorus  within normal limits  -Patient will be discharged to skilled nursing facility for physical rehabilitation and conditioning.  -When necessary Ativan available for anxiety/any further withdrawal symptoms.   2-fall and left clavicle fracture: -sling in place -will use tylenol, Flexeril and PRN tramadol for pain control -PT has been asked to assess patient and recommended SNF for physical rehabilitation.  3-GERD; will continue PPI  4-anemia: most likely associated to AOCD and alcohol consumption  -anemia panel suggesting component of iron and B12 deficiency as well  -no signs of overt bleeding  -will discharged on niferex BID and B-12 supplementation -Patient also on thiamine and folic acid. -Instructed/encourage to stop drinking alcohol.  5-hx of atrial fibrillation  -continue amiodarone and ASA -not a candidate for anticoagulation due to falls and ongoing alcohol use -CHADsVASC 3  6-HTN: will continue current antihypertensive regimen -BP is overall stable and well controlled -Instructed to follow a heart healthy diet  7-HLD: continue zetia  8-chronic combined heart failure: Lasix ejection fraction 40-45% (prior to that patient ejection fraction in the 20-30%, he is status post ICD). -A stable and compensated -Will continue home medication regimen (carvedilol, lisinopril, spironolactone and Lasix) -Need to follow a low-sodium diet and his weight check on daily basis.   9-anxiety: Continue as needed Ativan.    Procedures:  See below for x-ray reports  Consultations:  None  Discharge Exam: Filed Vitals:   02/02/16 0555 02/02/16 0952   BP: 125/66   Pulse: 62 60  Temp: 98 F (36.7 C)   Resp: 16     General: A lot less Tremulous and with a CIWA score of 7. Patient still complaining of pain in his shoulders (but improved). No fever, no nausea, no vomiting. Oriented to person, knows who is the president and knew the year. Able to follow commands and eager to participate with rehabilitation in order to get home soon.   Cardiovascular: S1 and S2, no rubs or gallops  Respiratory: no wheezing, good air movement, no rales   Abdomen: soft, NT, ND, positive BS  Musculoskeletal: left upper ext on a sling, no LE edema appreciated, no cyanosis  Discharge Instructions   Discharge Instructions    Diet - low sodium heart healthy    Complete by:  As directed      Discharge instructions    Complete by:  As directed   Take medications as prescribed Heart healthy diet Physical rehabilitation and conditioning as per nursing home protocol Repeat basic metabolic panel in 5 days to follow electrolytes and renal function  Please make sure patient has adequate nutrition/hydration          Current Discharge Medication List    START taking these medications   Details  acetaminophen (TYLENOL) 325 MG tablet Take 2 tablets (650 mg total) by mouth every 6 (six) hours as needed for mild pain, fever or headache.    cyclobenzaprine (FLEXERIL) 10 MG tablet Take 1 tablet (10 mg total) by mouth 3 (three) times daily as needed for muscle spasms.    folic acid (FOLVITE) 1 MG tablet Take 1 tablet (1 mg total) by mouth daily.    iron polysaccharides (NIFEREX) 150 MG capsule Take 1 capsule (150 mg total) by mouth 2 (two) times daily.    pantoprazole (PROTONIX) 40 MG tablet Take 1 tablet (40 mg total) by mouth daily.    thiamine 100 MG tablet Take 1 tablet (100 mg total) by mouth daily.    traMADol (ULTRAM) 50 MG tablet Take 1 tablet (50 mg total) by mouth every 6 (six) hours as needed for severe pain. Qty: 20 tablet, Refills: 0       CONTINUE these medications which have CHANGED   Details  carvedilol (COREG) 6.25 MG tablet Take 1 tablet (6.25 mg total) by mouth 2 (two) times daily with a meal.    furosemide (LASIX) 40 MG tablet Take 1 tablet (40 mg total) by mouth daily.    LORazepam (ATIVAN) 0.5 MG tablet Take 1 tablet (0.5 mg total) by mouth every 12 (twelve) hours as needed for anxiety. Qty: 15 tablet, Refills: 0    vitamin B-12 (CYANOCOBALAMIN) 1000 MCG tablet Take 1 tablet (1,000 mcg total) by mouth daily.      CONTINUE these medications which have NOT CHANGED   Details  amiodarone (PACERONE) 200 MG tablet TAKE ONE TABLET BY MOUTH ONE TIME DAILY Qty: 30 tablet, Refills: 6    potassium chloride SA (K-DUR,KLOR-CON) 20 MEQ tablet TAKE ONE TABLET BY MOUTH ONE TIME DAILY Qty: 30 tablet, Refills: 6    aspirin EC 81 MG tablet Take by mouth daily.     enalapril (VASOTEC) 10 MG tablet Take 10 mg by mouth daily.    ezetimibe (ZETIA) 10 MG tablet Take 1 tablet (10 mg total) by mouth daily. Qty: 30 tablet, Refills: 5    magnesium oxide (MAG-OX) 400 MG tablet Take  400 mg by mouth 2 (two) times daily.    megestrol (MEGACE) 40 MG/ML suspension Take 600 mg by mouth daily.    nitroGLYCERIN (NITROSTAT) 0.4 MG SL tablet Place 1 tablet (0.4 mg total) under the tongue every 5 (five) minutes as needed. Chest pain Qty: 25 tablet, Refills: 3   Associated Diagnoses: Other specified forms of chronic ischemic heart disease    spironolactone (ALDACTONE) 25 MG tablet Take 1 tablet (25 mg total) by mouth daily. Qty: 30 tablet, Refills: 9      STOP taking these medications     predniSONE (DELTASONE) 10 MG tablet        Allergies  Allergen Reactions  . Atorvastatin Other (See Comments)    Joint pain.   Follow-up Information    Follow up with [facility].   Specialty:  Skilled Nursing Facility   Contact information:   [address] [place] [address] [phone]       The results of significant  diagnostics from this hospitalization (including imaging, microbiology, ancillary and laboratory) are listed below for reference.    Significant Diagnostic Studies: Dg Chest 1 View  01/29/2016  CLINICAL DATA:  Weakness.  Syncopal episodes. EXAM: CHEST 1 VIEW COMPARISON:  Left shoulder radiographs obtained earlier today. Chest radiographs dated 08/19/2012. FINDINGS: Decreased inspiration with no gross change in enlargement of the cardiac silhouette. The right subclavian pacer and AICD leads are unchanged. Stable post CABG changes. Mild bibasilar atelectasis. Otherwise, clear lungs. Old, healed left rib fractures are again demonstrated. There is also a probable acute fracture of the lateral aspect of the left seventh rib. Previously described distal left clavicle fracture. IMPRESSION: 1. Previously described distal left clavicle fracture. 2. Probable acute left seventh rib fracture. 3. Decreased inspiration with mild bibasilar atelectasis. Electronically Signed   By: Beckie Salts M.D.   On: 01/29/2016 16:11   Ct Head Wo Contrast  01/29/2016  CLINICAL DATA:  75 year old who fell approximately 2 days ago and was unable to get up, lying in his yard for several hours before he was found. Frequent falls at baseline, due to possible syncopal episodes. Current history of cerebrovascular disease with prior carotid endarterectomy. He presents now with acute onset of left-sided weakness which began earlier today. Initial encounter. EXAM: CT HEAD WITHOUT CONTRAST CT CERVICAL SPINE WITHOUT CONTRAST TECHNIQUE: Multidetector CT imaging of the head and cervical spine was performed following the standard protocol without intravenous contrast. Multiplanar CT image reconstructions of the cervical spine were also generated. COMPARISON:  Head CT 03/02/2013.  No prior cervical spine imaging. FINDINGS: CT HEAD FINDINGS Moderate age related cortical and deep atrophy, not significantly changed. Mild changes of small vessel disease of  the white matter, unchanged. Dystrophic cortical calcification involving the right frontotemporal region, unchanged. No mass lesion. No midline shift. No acute hemorrhage or hematoma. No extra-axial fluid collections. No evidence of acute infarction. No skull fracture or other focal osseous abnormality involving the skull. Visualized paranasal sinuses, bilateral mastoid air cells and bilateral middle ear cavities well-aerated. Severe bilateral carotid siphon and vertebral artery atherosclerosis. CT CERVICAL SPINE FINDINGS The patient's head is rotated slightly to the right. No fractures identified involving the cervical spine. Sagittal reconstructed images demonstrate degenerative grade 1 spondylolisthesis of C3 on C4 approximating 4 mm and C4 on C5 approximating 2 mm. Facet joints intact throughout with degenerative changes, particularly on the left. Disc space narrowing at C3-4 and C5-6. Soft tissue window images suggest a broad-based disc protrusion or extrusion at C6-7.  Coronal reformatted images demonstrate an intact craniocervical junction, intact C1-C2 articulation with degenerative changes, intact dens, and intact lateral masses throughout. Uncinate and predominantly facet hypertrophy account for multilevel foraminal stenoses including moderate left C2-3, severe bilateral C3-4, severe bilateral C4-5, severe bilateral C5-6, moderate right C6-7 and mild right C7-T1. IMPRESSION: 1. No acute intracranial abnormality. 2. Stable moderate generalized atrophy and mild chronic microvascular ischemic changes the white matter. 3. No fractures identified involving the cervical spine. 4. Degenerative changes throughout the cervical spine as detailed above. 5. Possible broad-based central disc protrusion or extrusion at C6-7. If the patient has radicular symptoms, MRI of the cervical spine without contrast could be performed on a non urgent basis. Electronically Signed   By: Hulan Saas M.D.   On: 01/29/2016 16:37    Ct Cervical Spine Wo Contrast  01/29/2016  CLINICAL DATA:  75 year old who fell approximately 2 days ago and was unable to get up, lying in his yard for several hours before he was found. Frequent falls at baseline, due to possible syncopal episodes. Current history of cerebrovascular disease with prior carotid endarterectomy. He presents now with acute onset of left-sided weakness which began earlier today. Initial encounter. EXAM: CT HEAD WITHOUT CONTRAST CT CERVICAL SPINE WITHOUT CONTRAST TECHNIQUE: Multidetector CT imaging of the head and cervical spine was performed following the standard protocol without intravenous contrast. Multiplanar CT image reconstructions of the cervical spine were also generated. COMPARISON:  Head CT 03/02/2013.  No prior cervical spine imaging. FINDINGS: CT HEAD FINDINGS Moderate age related cortical and deep atrophy, not significantly changed. Mild changes of small vessel disease of the white matter, unchanged. Dystrophic cortical calcification involving the right frontotemporal region, unchanged. No mass lesion. No midline shift. No acute hemorrhage or hematoma. No extra-axial fluid collections. No evidence of acute infarction. No skull fracture or other focal osseous abnormality involving the skull. Visualized paranasal sinuses, bilateral mastoid air cells and bilateral middle ear cavities well-aerated. Severe bilateral carotid siphon and vertebral artery atherosclerosis. CT CERVICAL SPINE FINDINGS The patient's head is rotated slightly to the right. No fractures identified involving the cervical spine. Sagittal reconstructed images demonstrate degenerative grade 1 spondylolisthesis of C3 on C4 approximating 4 mm and C4 on C5 approximating 2 mm. Facet joints intact throughout with degenerative changes, particularly on the left. Disc space narrowing at C3-4 and C5-6. Soft tissue window images suggest a broad-based disc protrusion or extrusion at C6-7. Coronal reformatted images  demonstrate an intact craniocervical junction, intact C1-C2 articulation with degenerative changes, intact dens, and intact lateral masses throughout. Uncinate and predominantly facet hypertrophy account for multilevel foraminal stenoses including moderate left C2-3, severe bilateral C3-4, severe bilateral C4-5, severe bilateral C5-6, moderate right C6-7 and mild right C7-T1. IMPRESSION: 1. No acute intracranial abnormality. 2. Stable moderate generalized atrophy and mild chronic microvascular ischemic changes the white matter. 3. No fractures identified involving the cervical spine. 4. Degenerative changes throughout the cervical spine as detailed above. 5. Possible broad-based central disc protrusion or extrusion at C6-7. If the patient has radicular symptoms, MRI of the cervical spine without contrast could be performed on a non urgent basis. Electronically Signed   By: Hulan Saas M.D.   On: 01/29/2016 16:37   Dg Shoulder Left  01/29/2016  CLINICAL DATA:  Left shoulder pain and bruising.  Probable fall. EXAM: LEFT SHOULDER - 2+ VIEW COMPARISON:  None. FINDINGS: Minimally comminuted oblique fracture of the distal clavicle with almost 1 shaft width of superior displacement of the distal fragment and  overlapping of the fragments. The distal fragment maintains a normal relationship with the acromion and the coracoclavicular distance is narrowed. IMPRESSION: Distal clavicle fracture, as described above. Electronically Signed   By: Beckie Salts M.D.   On: 01/29/2016 16:06   Labs: Basic Metabolic Panel:  Recent Labs Lab 01/29/16 1520 01/30/16 0542 01/31/16 0615 02/02/16 0619  NA 134* 136 135 139  K 3.1* 3.7 3.6 3.8  CL 96* 99* 98* 104  CO2 GLUCOSE 96 75 93 89  BUN 12 12 26* 30*  CREATININE 0.98 0.97 1.34* 1.21  CALCIUM 8.5* 8.5* 8.5* 8.9  MG  --   --  2.0  --   PHOS  --   --  2.8  --    Liver Function Tests:  Recent Labs Lab 01/29/16 1520 01/30/16 0542  AST 52* 50*  ALT  34 32  ALKPHOS 59 60  BILITOT 0.6 1.3*  PROT 7.4 6.8  ALBUMIN 3.3* 3.1*   CBC:  Recent Labs Lab 01/29/16 1520 01/30/16 0542  WBC 7.2 6.3  NEUTROABS 5.1  --   HGB 8.7* 8.3*  HCT 27.6* 26.4*  MCV 77.3* 78.1  PLT 260 244    Signed:  Vassie Loll MD.  [facility] 02/02/2016, 11:19 AM

## 2016-02-02 NOTE — Progress Notes (Signed)
Patient continues to remove sling from left arm.  Dr. Gwenlyn Perking notified and aware.

## 2016-02-02 NOTE — Progress Notes (Signed)
Patient transported by EMS to [facility].  Packet with prescriptions given to EMS by secretary.  Patient stable at time of transport.

## 2016-02-02 NOTE — Progress Notes (Signed)
Report called earlier to Clint Lipps, nurse at [facility].  Discharge Summary and FL2 faxed and will be sent in packet with patient.  Lea confirmed receipt of information via fax.  Patient's IV removed.  Site WNL.  Patient to be transported via EMS.  Patient's daughter aware.

## 2016-02-18 ENCOUNTER — Telehealth: Payer: Self-pay | Admitting: Cardiology

## 2016-02-18 ENCOUNTER — Ambulatory Visit: Payer: Medicare Other | Admitting: *Deleted

## 2016-02-18 NOTE — Telephone Encounter (Signed)
Attempted to confirm remote transmission with pt. No answer and was unable to leave a message.   

## 2016-02-19 ENCOUNTER — Encounter: Payer: Self-pay | Admitting: Cardiology

## 2016-02-22 ENCOUNTER — Ambulatory Visit (INDEPENDENT_AMBULATORY_CARE_PROVIDER_SITE_OTHER): Payer: Medicare Other | Admitting: *Deleted

## 2016-02-22 DIAGNOSIS — I255 Ischemic cardiomyopathy: Secondary | ICD-10-CM | POA: Diagnosis not present

## 2016-02-22 NOTE — Progress Notes (Signed)
Remote ICD transmission.   

## 2016-02-29 LAB — CUP PACEART REMOTE DEVICE CHECK
Battery Voltage: 2.99 V
Brady Statistic AP VP Percent: 2.04 %
Brady Statistic AP VS Percent: 0.14 %
Brady Statistic AS VP Percent: 97.78 %
Brady Statistic AS VS Percent: 0.05 %
HIGH POWER IMPEDANCE MEASURED VALUE: 323 Ohm
HighPow Impedance: 91 Ohm
Implantable Lead Implant Date: 20130828
Implantable Lead Implant Date: 20130828
Implantable Lead Location: 753858
Implantable Lead Location: 753859
Implantable Lead Location: 753860
Implantable Lead Model: 4194
Implantable Lead Model: 5076
Lead Channel Impedance Value: 380 Ohm
Lead Channel Impedance Value: 589 Ohm
Lead Channel Pacing Threshold Amplitude: 0.75 V
Lead Channel Pacing Threshold Amplitude: 0.75 V
Lead Channel Pacing Threshold Amplitude: 1.125 V
Lead Channel Pacing Threshold Pulse Width: 0.4 ms
Lead Channel Pacing Threshold Pulse Width: 0.4 ms
Lead Channel Sensing Intrinsic Amplitude: 1.625 mV
Lead Channel Setting Pacing Amplitude: 1.75 V
Lead Channel Setting Pacing Amplitude: 2.25 V
Lead Channel Setting Pacing Pulse Width: 0.4 ms
Lead Channel Setting Pacing Pulse Width: 0.4 ms
Lead Channel Setting Sensing Sensitivity: 0.3 mV
MDC IDC LEAD IMPLANT DT: 20130828
MDC IDC LEAD MODEL: 6935
MDC IDC MSMT LEADCHNL LV IMPEDANCE VALUE: 323 Ohm
MDC IDC MSMT LEADCHNL LV IMPEDANCE VALUE: 760 Ohm
MDC IDC MSMT LEADCHNL RA IMPEDANCE VALUE: 494 Ohm
MDC IDC MSMT LEADCHNL RA SENSING INTR AMPL: 1.625 mV
MDC IDC MSMT LEADCHNL RV PACING THRESHOLD PULSEWIDTH: 0.4 ms
MDC IDC MSMT LEADCHNL RV SENSING INTR AMPL: 7.75 mV
MDC IDC MSMT LEADCHNL RV SENSING INTR AMPL: 7.75 mV
MDC IDC SESS DTM: 20170303171757
MDC IDC SET LEADCHNL RV PACING AMPLITUDE: 2 V
MDC IDC STAT BRADY RA PERCENT PACED: 2.17 %
MDC IDC STAT BRADY RV PERCENT PACED: 99.82 %

## 2016-02-29 NOTE — Progress Notes (Signed)
Normal remote reviewed. Activity level decreased - recent fall and clavicle fracture ->SNF  Next follow up 04/2016 with [facility]

## 2016-03-05 ENCOUNTER — Encounter: Payer: Self-pay | Admitting: Cardiology

## 2016-04-18 ENCOUNTER — Other Ambulatory Visit: Payer: Self-pay | Admitting: *Deleted

## 2016-04-18 MED ORDER — FUROSEMIDE 40 MG PO TABS: 40.0000 mg | ORAL_TABLET | Freq: Every day | ORAL | Status: DC

## 2016-06-13 ENCOUNTER — Other Ambulatory Visit: Payer: Self-pay | Admitting: Cardiovascular Disease

## 2016-06-13 ENCOUNTER — Telehealth: Payer: Self-pay | Admitting: Cardiovascular Disease

## 2016-06-13 NOTE — Telephone Encounter (Signed)
Spoke w/pharmacy staff. Patient came in for refill today as he is out of lasix - he got an Rx 6/3. He told pharmacy staff he is taking 1-2 lasix 40mg  tablets QD - Rx was written for 40mg  QD at hospital discharge 01/2016. There have been no cardiology visits since then  Attempted to contact patient - mobile # did not work, home # rang continuously with no answer Was calling to seek clarification on why he was taking 1-2 tablets QD instead of 1 per Rx Patient is also due for appointment w/MD  Called pharmacy back to OK them to give patient enough tablets for him to take BID but he has already gotten 4 tablets to get him thru the weekend if he takes only 1 per day.   Will leave message in triage for a call attempt to be made on Monday June 26 to clarify dose/get an appointment scheduled

## 2016-06-13 NOTE — Telephone Encounter (Signed)
New message       *STAT* If patient is at the pharmacy, call can be transferred to refill team.   1. Which medications need to be refilled? (please list name of each medication and dose if known) furosemide 40mg  2. Which pharmacy/location (including street and city if local pharmacy) is medication to be sent to?[facility] [place]  3. Do they need a 30 day or 90 day supply? 30 Pt just got a refill on 05-24-16 and he is out of medication already.  He told pharmacist that he was taking 1-2 pills daily.  They need a new presc called.

## 2016-06-13 NOTE — Telephone Encounter (Signed)
[facility] is calling from [facility] is needing to clarify the instructions on the medication Furosemide 40mg  . Please call   Thanks

## 2016-06-13 NOTE — Telephone Encounter (Signed)
Patient called no answer.Left message on personal voice mail we received a request to refill lasix.Refill sent to pharmacy,needs to call office to schedule appointment with Dr.Croitoru.

## 2016-06-16 NOTE — Telephone Encounter (Signed)
Left message for pt to call.

## 2016-06-25 ENCOUNTER — Telehealth: Payer: Self-pay | Admitting: Cardiovascular Disease

## 2016-06-25 NOTE — Telephone Encounter (Signed)
Returned call to patient's daughter [name].She stated her father has swelling in both lower legs,feet,abdomen.Sob occasional.He does not weigh his self.Stated he has doubled lasix for the past few days with no results.Advised he needs to be seen.Dr.Croitoru's schedule full.I will speak to our scheduler about appointment with a PA and call her back tomorrow.

## 2016-06-25 NOTE — Telephone Encounter (Signed)
Pt c/o swelling: STAT is pt has developed SOB within 24 hours  1. How long have you been experiencing swelling? 2wks  2. Where is the swelling located? Lower legs and feet   3.  Are you currently taking a "fluid pill"?on a daily fluid pill ( not quite sure what the name of it is) .[name]. Patient has double up on medication , but its not working   4.  Are you currently SOB? Some   5.  Have you traveled recently?no

## 2016-06-26 ENCOUNTER — Encounter: Payer: Self-pay | Admitting: Physician Assistant

## 2016-06-26 ENCOUNTER — Ambulatory Visit (INDEPENDENT_AMBULATORY_CARE_PROVIDER_SITE_OTHER): Payer: Medicare Other | Admitting: Physician Assistant

## 2016-06-26 VITALS — BP 148/82 | HR 71 | Ht 73.0 in | Wt 188.8 lb

## 2016-06-26 DIAGNOSIS — Z952 Presence of prosthetic heart valve: Secondary | ICD-10-CM

## 2016-06-26 DIAGNOSIS — I25810 Atherosclerosis of coronary artery bypass graft(s) without angina pectoris: Secondary | ICD-10-CM | POA: Diagnosis not present

## 2016-06-26 DIAGNOSIS — Z954 Presence of other heart-valve replacement: Secondary | ICD-10-CM

## 2016-06-26 DIAGNOSIS — Z79899 Other long term (current) drug therapy: Secondary | ICD-10-CM | POA: Diagnosis not present

## 2016-06-26 DIAGNOSIS — Z9889 Other specified postprocedural states: Secondary | ICD-10-CM

## 2016-06-26 DIAGNOSIS — Z8679 Personal history of other diseases of the circulatory system: Secondary | ICD-10-CM

## 2016-06-26 DIAGNOSIS — Z95810 Presence of automatic (implantable) cardiac defibrillator: Secondary | ICD-10-CM

## 2016-06-26 DIAGNOSIS — I5022 Chronic systolic (congestive) heart failure: Secondary | ICD-10-CM | POA: Diagnosis not present

## 2016-06-26 DIAGNOSIS — I495 Sick sinus syndrome: Secondary | ICD-10-CM

## 2016-06-26 DIAGNOSIS — R600 Localized edema: Secondary | ICD-10-CM

## 2016-06-26 LAB — CBC WITH DIFFERENTIAL/PLATELET
BASOS ABS: 70 {cells}/uL (ref 0–200)
Basophils Relative: 1 %
EOS PCT: 1 %
Eosinophils Absolute: 70 cells/uL (ref 15–500)
HEMATOCRIT: 30.4 % — AB (ref 38.5–50.0)
HEMOGLOBIN: 9.3 g/dL — AB (ref 13.2–17.1)
LYMPHS ABS: 1050 {cells}/uL (ref 850–3900)
LYMPHS PCT: 15 %
MCH: 22.4 pg — ABNORMAL LOW (ref 27.0–33.0)
MCHC: 30.6 g/dL — AB (ref 32.0–36.0)
MCV: 73.3 fL — ABNORMAL LOW (ref 80.0–100.0)
MONO ABS: 980 {cells}/uL — AB (ref 200–950)
MPV: 9.2 fL (ref 7.5–12.5)
Monocytes Relative: 14 %
NEUTROS PCT: 69 %
Neutro Abs: 4830 cells/uL (ref 1500–7800)
Platelets: 237 10*3/uL (ref 140–400)
RBC: 4.15 MIL/uL — AB (ref 4.20–5.80)
RDW: 16.3 % — ABNORMAL HIGH (ref 11.0–15.0)
WBC: 7 10*3/uL (ref 3.8–10.8)

## 2016-06-26 LAB — HEPATIC FUNCTION PANEL
ALBUMIN: 3.7 g/dL (ref 3.6–5.1)
ALK PHOS: 77 U/L (ref 40–115)
ALT: 11 U/L (ref 9–46)
AST: 23 U/L (ref 10–35)
Bilirubin, Direct: 0.1 mg/dL (ref <=0.2)
Indirect Bilirubin: 0.4 mg/dL (ref 0.2–1.2)
TOTAL PROTEIN: 7.2 g/dL (ref 6.1–8.1)
Total Bilirubin: 0.5 mg/dL (ref 0.2–1.2)

## 2016-06-26 LAB — BASIC METABOLIC PANEL
BUN: 12 mg/dL (ref 7–25)
CALCIUM: 8.8 mg/dL (ref 8.6–10.3)
CO2: 27 mmol/L (ref 20–31)
Chloride: 97 mmol/L — ABNORMAL LOW (ref 98–110)
Creat: 1.14 mg/dL (ref 0.70–1.18)
GLUCOSE: 95 mg/dL (ref 65–99)
Potassium: 3.3 mmol/L — ABNORMAL LOW (ref 3.5–5.3)
SODIUM: 137 mmol/L (ref 135–146)

## 2016-06-26 MED ORDER — FUROSEMIDE 40 MG PO TABS: 40.0000 mg | ORAL_TABLET | Freq: Every day | ORAL | Status: DC

## 2016-06-26 NOTE — Progress Notes (Signed)
Cardiology Office Note    Date:  06/26/2016   ID:  [name], DOB [birth year], MRN [id number]  PCP:  Fabio NeighborsFry, Rebecca Ann  Cardiologist:  Dr. Royann Shiversroitoru  Chief Complaint  Patient presents with  . Edema    let/feet  . Follow-up    seen for Dr. Royann Shiversroitoru    History of Present Illness:  [name] is a 75 y.o. 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 His last cardiac catheterization in 2010 showed patent LIMA to LAD, free RIMA to OM, SVG to ramus, 80% stenosis in small PLA branch with inferior hypokinesis. This cardiac catheterization was done prior to the aortic valve replacement for stenosis with biological prosthesis in 2010. He required inotropic support for sure. After the surgery, however subsequently had significant functional improvement. His ejection fraction was as low as 20-25% one point. However most recent echocardiogram in April 2014 showed EF 40-45% with inferior wall abnormality. He had implantation of CRT-D device in 2008 however he had infection with subsequent system removal in July 2013 with reimplantation in August 2013. He has remote history of GI bleed, therefore he is not on systemic anticoagulation for paroxysmal A. fib, he does take aspirin. He had previous left and right carotid endarterectomy. 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 There was a plan for percutaneous coiling of aneurysm, however does not appears to be performed due to his other health issues.  He was last seen in the office on 04/26/2015, interrogation of his defibrillator showed normal device  function, no episodes of atrial fibrillation in 12 months prior to the visit, and a rare episode of maximum 9 minutes of regular PAT however no ventricular ectopy. He was most recently admitted in February 2017 after complaining with left upper extremity pain. He had a lot of bruising on physical exam, x-ray demonstrated left distal clavicular fracture. He also was intoxicated with alcohol. He was admitted for observation and was eventually [facility].  Recently, he has been noticing increasing fluid buildup in the lower extremity and abdomen. He says it is going on for the past few days. He has been taking additional dose of Lasix intermittently. Otherwise he has been compliant with all of his medications. He is running out of lasix. He does have some question regarding the cost of medication as he pays roughly $700 per month medications alone. I have listed all of his cardiac medication for him to check with his pharmacy. I will write him a refill on his Lasix. Otherwise he denies any chest discomfort, he has occasional indigestion, however those are clearly not his typical angina. Otherwise he denies any current shortness of breath. He appears to be euvolemic on physical exam. Given the fact that he is still on amiodarone, I will obtain CBC, BMET, LFT and TSH today.    Past Medical History  Diagnosis Date  . Chest pain   . Hypertension   . Hyperlipidemia   . 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 H/O atrial  flutter     ablation in sinus rhythm   . GERD (gastroesophageal reflux disease)     with gas and bloating probably cause of chest pain  . Ischemic cardiomyopathy     H/O with EF at this cath of 45%  . H/O: gout   . 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 Myocardial infarction (HCC)   . Anginal pain (HCC)   . Dysrhythmia   . Anxiety   .  Shortness of breath   . Pacemaker   . CHF (congestive heart failure) (HCC)   . Arthritis     gout    Past Surgical History  Procedure Laterality Date  . Coronary artery bypass graft  1993    redo in 2000, he has had multiple MI previous to both procedures.  . Cardiac catheterization  Dec 18, 2003    with a presentation of atrial flutter with rapid ventricular response. He had a cath and two stents to his PDA after his SVG to his RCA. His other graft were patent. He had LV with EF of 30%.   . Carotid endarterectomy      left in December 2003 and know renal artery stenosis of 60-70%  . Valve replacement  2010  . Implantable cardioverter defibrillator (icd) generator change N/A 06/25/2012    Procedure: ICD GENERATOR CHANGE;  Surgeon: Marinus Maw, MD;  Location: [facility] CATH LAB;  Service: Cardiovascular;  Laterality: N/A;  . Bi-ventricular implantable cardioverter defibrillator Right 08/18/2012    Procedure: BI-VENTRICULAR IMPLANTABLE CARDIOVERTER DEFIBRILLATOR  (CRT-D);  Surgeon: Marinus Maw, MD;  Location: [facility] CATH LAB;  Service: Cardiovascular;  Laterality: Right;    Current Medications: Outpatient Prescriptions Prior to Visit  Medication Sig Dispense Refill  . acetaminophen (TYLENOL) 325 MG tablet Take 2 tablets (650 mg total) by mouth every 6 (six) hours as needed for mild pain, fever or headache.    [name] amiodarone (PACERONE) 200 MG tablet TAKE ONE TABLET BY MOUTH ONE TIME DAILY 30 tablet 6  . aspirin EC 81 MG tablet Take by mouth daily.     . carvedilol (COREG) 6.25 MG tablet Take 1 tablet (6.25 mg total) by mouth 2 (two) times daily with a meal.    . enalapril (VASOTEC) 10 MG tablet Take 10 mg by mouth daily.    [name] ezetimibe (ZETIA) 10 MG tablet Take 1 tablet (10 mg total) by mouth daily. 30 tablet 5  . folic acid (FOLVITE) 1 MG tablet Take 1 tablet (1 mg total) by mouth daily.    . iron polysaccharides (NIFEREX) 150 MG capsule Take 1 capsule (150 mg total) by mouth 2 (two) times daily.     [name] LORazepam (ATIVAN) 0.5 MG tablet Take 1 tablet (0.5 mg total) by mouth every 12 (twelve) hours as needed for anxiety. 15 tablet 0  . magnesium oxide (MAG-OX) 400 MG tablet Take 400 mg by mouth 2 (two) times daily.    . megestrol (MEGACE) 40 MG/ML suspension Take 600 mg by mouth daily.    . nitroGLYCERIN (NITROSTAT) 0.4 MG SL tablet Place 1 tablet (0.4 mg total) under the tongue every 5 (five) minutes as needed. Chest pain 25 tablet 3  . pantoprazole (PROTONIX) 40 MG tablet Take 1 tablet (40 mg total) by mouth daily.    . potassium chloride SA (K-DUR,KLOR-CON) 20 MEQ tablet TAKE ONE TABLET BY MOUTH ONE TIME DAILY 30 tablet 6  . spironolactone (ALDACTONE) 25 MG tablet Take 1 tablet (25  mg total) by mouth daily. 30 tablet 9  . thiamine 100 MG tablet Take 1 tablet (100 mg total) by mouth daily.    . traMADol (ULTRAM) 50 MG tablet Take 1 tablet (50 mg total) by mouth every 6 (six) hours as needed for severe pain. 20 tablet 0  . vitamin B-12 (CYANOCOBALAMIN) 1000 MCG tablet Take 1 tablet (1,000 mcg total) by mouth daily.    . cyclobenzaprine (FLEXERIL) 10 MG tablet Take 1 tablet (10 mg total) by mouth 3 (three) times daily as needed for muscle spasms.    . furosemide (LASIX) 40 MG tablet TAKE 1 TABLET (40 MG TOTAL) BY MOUTH DAILY. 30 tablet 0   No facility-administered medications prior to visit.     Allergies:   Atorvastatin   Social History   Social History  . Marital Status: Married    Spouse Name: N/A  . Number of Children: N/A  . Years of Education: N/A   Social History Main Topics  . Smoking status: Never Smoker   . Smokeless tobacco: Current User    Types: Chew  . Alcohol Use: 8.4 oz/week    14 Cans of beer per week  . Drug Use: No  . Sexual Activity: Not Asked   Other Topics Concern  . None   Social History Narrative     Family History:  The patient's family history is not on file.   ROS:   Please see the history of present illness.    ROS All other systems  reviewed and are negative.   PHYSICAL EXAM:   VS:  BP 148/82 mmHg  Pulse 71  Ht 6\' 1"  (1.854 m)  Wt 188 lb 12.8 oz (85.639 kg)  BMI 24.91 kg/m2   GEN: Well nourished, well developed, in no acute distress HEENT: normal Neck: no JVD, carotid bruits, or masses Cardiac: RRR; no murmurs, rubs, or gallops,no edema  Respiratory:  clear to auscultation bilaterally, normal work of breathing GI: soft, nontender, nondistended, + BS MS: no deformity or atrophy Skin: warm and dry, no rash Neuro:  Alert and Oriented x 3, Strength and sensation are intact Psych: euthymic mood, full affect  Wt Readings from Last 3 Encounters:  06/26/16 188 lb 12.8 oz (85.639 kg)  01/29/16 185 lb 9.6 oz (84.188 kg)  01/20/16 189 lb (85.73 kg)      Studies/Labs Reviewed:   EKG:  EKG is not ordered today.  Recent Labs: 01/30/2016: ALT 32; Hemoglobin 8.3*; Platelets 244 01/31/2016: Magnesium 2.0 02/02/2016: BUN 30*; Creatinine, Ser 1.21; Potassium 3.8; Sodium 139   Lipid Panel    Component Value Date/Time   CHOL 281* 06/02/2014 1154   TRIG 163* 06/02/2014 1154   HDL 64 06/02/2014 1154   CHOLHDL 4.4 06/02/2014 1154   VLDL 33 06/02/2014 1154   LDLCALC 184* 06/02/2014 1154    Additional studies/ records that were reviewed today include:   Echo 03/23/2013 LV EF: 40% -  45%  Study Conclusions  - Left ventricle: The cavity size was moderately dilated. There was moderate concentric hypertrophy. Systolic function was mildly to moderately reduced. The estimated ejection fraction was in the range of 40% to 45%. Moderate hypokinesis of the inferolateral myocardium. Mild hypokinesis of the inferior myocardium. Doppler parameters are consistent with abnormal left ventricular relaxation (grade 1 diastolic dysfunction). - Aortic valve: A bioprosthesis was present. Valve area: 1.48cm^2(VTI). Valve area: 1.35cm^2 (Vmax). - Mitral valve: Calcified annulus. Mild regurgitation. Valve area by  pressure half-time: 1.31cm^2. Valve area by continuity equation (  using LVOT flow): 2.68cm^2. - Left atrium: The atrium was mildly to moderately dilated. - Right atrium: The atrium was mildly dilated. - Atrial septum: No defect or patent foramen ovale was identified. Impressions:  - Bi-V ICD with Bi-V pacding. Segmental wall motion abnormalities with EF approximately =>40%. Stable peridardial AVR. No significant intra-ventricular dyssynchrony.   ASSESSMENT:    1. Bilateral edema of lower extremity   2. On amiodarone therapy   3. Chronic systolic heart failure (HCC)   4. Coronary artery disease involving coronary bypass graft of native heart without angina pectoris   5. Sinus node dysfunction (HCC)   6. ICD (implantable cardioverter-defibrillator) in place   7. S/P AVR (aortic valve replacement)   8. Status post ablation of atrial flutter      PLAN:  In order of problems listed above:  1. Bilateral LE edema: he is actually euvolemic on exam today, he only take additional lasix when has has increased fluid buildup. His lung is clear, no significant LE edema observed. I will not change his lasix at this time and continue to instruct him to take additional dose of lasix as needed. I have also discussed with him regarding Na and fluid restriction  2. CAD s/p redo CABG: last cath 2010, all grafts patent  -  last cardiac catheterization in 2010 showed patent LIMA to LAD, free RIMA to OM, SVG to ramus, 80% stenosis in small PLA branch with inferior hypokinesis.  3. PAF on amiodarone. Not on systemic anticoagulation due to remote h/o GI bleed. No h/o stroke or TIA  - TSH, LFT, CBC and BMET today  4. ICM with EF 40-45%: euvolemic on exam.  5. Sinus nodal dysfunction s/p Medtronic CRT-D: last interrogation March, normal remote transmission, decrease activity level corresponding with timing of clavicle fracture  6. 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     Medication Adjustments/Labs and Tests Ordered: Current medicines are reviewed at length with the patient today.  Concerns regarding medicines are outlined above.  Medication changes, Labs and Tests ordered today are listed in the Patient Instructions below. Patient Instructions  Medication Instructions:  Your physician recommends that you continue on your current medications as directed. Please refer to the Current Medication list given to you today.   Lasix  Has been refilled today   Labwork: Bmet, Cbc, Tsh, Lft  Testing/Procedures: None ordered  Follow-Up: Your physician wants you to follow-up in: 3 months with Dr.Croitoru You will receive a reminder letter in the mail two months in advance. If you don't receive a letter, please call our office to schedule the follow-up appointment.   Any Other Special Instructions Will Be Listed Below (If Applicable).     If you need a refill on your cardiac medications before your next appointment, please call your pharmacy.       Ramond DialSigned, Teron Blais, [place]  06/26/2016 5:38 PM    [facility] [address], [place], [place]  [address] Phone: [phone]; Fax: [phone]) [phone]

## 2016-06-26 NOTE — Telephone Encounter (Signed)
Returned call to daughter Waynetta SandyBeth left message on personal voice mail I scheduled father appointment today at 2:00 pm with Azalee CourseHao Meng PA at [facility] office.  Spoke to wife advised of appointment.Advised to bring a list of all medication to appt.

## 2016-06-26 NOTE — Patient Instructions (Addendum)
Medication Instructions:  Your physician recommends that you continue on your current medications as directed. Please refer to the Current Medication list given to you today.   Lasix  Has been refilled today   Labwork: Bmet, Cbc, Tsh, Lft  Testing/Procedures: None ordered  Follow-Up: Your physician wants you to follow-up in: 3 months with Dr.Croitoru You will receive a reminder letter in the mail two months in advance. If you don't receive a letter, please call our office to schedule the follow-up appointment.   Any Other Special Instructions Will Be Listed Below (If Applicable).     If you need a refill on your cardiac medications before your next appointment, please call your pharmacy.

## 2016-06-27 ENCOUNTER — Telehealth: Payer: Self-pay | Admitting: Physician Assistant

## 2016-06-27 LAB — TSH: TSH: 5.97 mIU/L — ABNORMAL HIGH (ref 0.40–4.50)

## 2016-06-27 NOTE — Telephone Encounter (Signed)
Returned pts call.  No answer and no machine.  Will try again later.

## 2016-06-27 NOTE — Telephone Encounter (Signed)
New message ° ° ° ° °Returning a call to the nurse to get lab results °

## 2016-07-10 ENCOUNTER — Other Ambulatory Visit: Payer: Self-pay | Admitting: *Deleted

## 2016-07-11 MED ORDER — AMIODARONE HCL 200 MG PO TABS: 200.0000 mg | ORAL_TABLET | Freq: Every day | ORAL | Status: DC

## 2016-07-11 NOTE — Telephone Encounter (Signed)
REFILL 

## 2016-07-15 ENCOUNTER — Encounter: Payer: Self-pay | Admitting: *Deleted

## 2016-07-23 ENCOUNTER — Other Ambulatory Visit: Payer: Self-pay | Admitting: Cardiovascular Disease

## 2016-07-23 NOTE — Telephone Encounter (Signed)
Rx request sent to pharmacy.  

## 2016-08-12 ENCOUNTER — Ambulatory Visit (INDEPENDENT_AMBULATORY_CARE_PROVIDER_SITE_OTHER): Payer: Medicare Other | Admitting: *Deleted

## 2016-08-12 DIAGNOSIS — I255 Ischemic cardiomyopathy: Secondary | ICD-10-CM

## 2016-08-12 DIAGNOSIS — Z95810 Presence of automatic (implantable) cardiac defibrillator: Secondary | ICD-10-CM | POA: Diagnosis not present

## 2016-08-13 NOTE — Progress Notes (Signed)
Remote ICD transmission.   

## 2016-08-15 ENCOUNTER — Encounter: Payer: Self-pay | Admitting: Cardiology

## 2016-08-20 ENCOUNTER — Encounter: Payer: Self-pay | Admitting: Cardiology

## 2016-08-21 LAB — CUP PACEART REMOTE DEVICE CHECK
Battery Voltage: 2.99 V
Brady Statistic AP VP Percent: 28.35 %
Brady Statistic AS VP Percent: 71.58 %
Brady Statistic RA Percent Paced: 28.37 %
Brady Statistic RV Percent Paced: 99.93 %
Date Time Interrogation Session: 20170822152352
HighPow Impedance: 323 Ohm
HighPow Impedance: 84 Ohm
Implantable Lead Implant Date: 20130828
Implantable Lead Implant Date: 20130828
Implantable Lead Location: 753858
Implantable Lead Location: 753860
Implantable Lead Model: 4194
Implantable Lead Model: 6935
Lead Channel Impedance Value: 494 Ohm
Lead Channel Impedance Value: 760 Ohm
Lead Channel Pacing Threshold Amplitude: 0.875 V
Lead Channel Pacing Threshold Pulse Width: 0.4 ms
Lead Channel Pacing Threshold Pulse Width: 0.4 ms
Lead Channel Sensing Intrinsic Amplitude: 1.125 mV
Lead Channel Sensing Intrinsic Amplitude: 1.125 mV
MDC IDC LEAD IMPLANT DT: 20130828
MDC IDC LEAD LOCATION: 753859
MDC IDC MSMT LEADCHNL LV IMPEDANCE VALUE: 323 Ohm
MDC IDC MSMT LEADCHNL LV IMPEDANCE VALUE: 627 Ohm
MDC IDC MSMT LEADCHNL LV PACING THRESHOLD AMPLITUDE: 1 V
MDC IDC MSMT LEADCHNL RA PACING THRESHOLD AMPLITUDE: 0.75 V
MDC IDC MSMT LEADCHNL RA PACING THRESHOLD PULSEWIDTH: 0.4 ms
MDC IDC MSMT LEADCHNL RV IMPEDANCE VALUE: 342 Ohm
MDC IDC MSMT LEADCHNL RV SENSING INTR AMPL: 13.375 mV
MDC IDC MSMT LEADCHNL RV SENSING INTR AMPL: 13.375 mV
MDC IDC SET LEADCHNL LV PACING AMPLITUDE: 2 V
MDC IDC SET LEADCHNL LV PACING PULSEWIDTH: 0.4 ms
MDC IDC SET LEADCHNL RA PACING AMPLITUDE: 1.5 V
MDC IDC SET LEADCHNL RV PACING AMPLITUDE: 2 V
MDC IDC SET LEADCHNL RV PACING PULSEWIDTH: 0.4 ms
MDC IDC SET LEADCHNL RV SENSING SENSITIVITY: 0.3 mV
MDC IDC STAT BRADY AP VS PERCENT: 0.02 %
MDC IDC STAT BRADY AS VS PERCENT: 0.05 %

## 2016-09-25 ENCOUNTER — Encounter: Payer: Self-pay | Admitting: Cardiovascular Disease

## 2016-09-25 ENCOUNTER — Ambulatory Visit (INDEPENDENT_AMBULATORY_CARE_PROVIDER_SITE_OTHER): Payer: Medicare Other | Admitting: Cardiovascular Disease

## 2016-09-25 VITALS — BP 140/71 | HR 72 | Ht 73.0 in | Wt 188.0 lb

## 2016-09-25 DIAGNOSIS — Z79899 Other long term (current) drug therapy: Secondary | ICD-10-CM

## 2016-09-25 DIAGNOSIS — Z953 Presence of xenogenic heart valve: Secondary | ICD-10-CM

## 2016-09-25 DIAGNOSIS — I5042 Chronic combined systolic (congestive) and diastolic (congestive) heart failure: Secondary | ICD-10-CM

## 2016-09-25 DIAGNOSIS — I25718 Atherosclerosis of autologous vein coronary artery bypass graft(s) with other forms of angina pectoris: Secondary | ICD-10-CM

## 2016-09-25 DIAGNOSIS — E785 Hyperlipidemia, unspecified: Secondary | ICD-10-CM

## 2016-09-25 DIAGNOSIS — I6523 Occlusion and stenosis of bilateral carotid arteries: Secondary | ICD-10-CM

## 2016-09-25 DIAGNOSIS — Z95810 Presence of automatic (implantable) cardiac defibrillator: Secondary | ICD-10-CM

## 2016-09-25 DIAGNOSIS — I671 Cerebral aneurysm, nonruptured: Secondary | ICD-10-CM

## 2016-09-25 DIAGNOSIS — I10 Essential (primary) hypertension: Secondary | ICD-10-CM

## 2016-09-25 NOTE — Progress Notes (Signed)
Cardiology Office Note    Date:  09/25/2016   ID:  [name], DOB [birth year], MRN [id number]  PCP:  Fabio Neighbors  Cardiologist:   Thurmon Fair, MD   Chief Complaint  Patient presents with  . Follow-up  . Dizziness    History of Present Illness:  [name] is a 75 y.o. male with a long-standing history of CAD with redo CABG 2004, ischemic cardiomyopathy, CRT-D, well compensated heart failure, bilateral carotid stenoses and endarterectomy, returning for his first follow-up visit since May 2016. He has been mostly compliant with device downloads during this time, although not 100% consistently.  He has been poorly compliant with recommended follow-up but showed up in July of this year when he had some leg swelling.  He feels great, denying any problems with shortness of breath or angina. 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 He denies any angina pectoris. He has occasional "indigestion" occurring at rest, promptly relieved by Gas-X.  He states that he pays attention to sodium restriction. He never drinks water but drinks a sixpack of beer every day. He reports concerns that the water in his community is contaminated with arsenic and lead. He was hospitalized for alcohol intoxication early in 2017, he also had a left clavicular fracture.  He has stopped almost all his medications. He is only taking aspirin 81 mg daily furosemide 40 mg once daily, potassium chloride 20 mg once daily and amiodarone 200 mg daily. He reports feeling much better after he stopped all of his other medications, which included carvedilol and ACE inhibitor and spironolactone, statin and a variety of noncardiac medications. He reports taking a double dose of furosemide occasionally for fluid buildup, but has not done so in about 3 months.  Device interrogation shows normal function. Battery voltage is 2.97 V (RRT equals 2.63 V), all lead parameters  are excellent, there is virtually 100% biventricular pacing. There is also roughly 27% atrial pacing. There've been no episodes of treated ventricular arrhythmia or even nonsustained ventricular tachycardia. A few very brief episodes of atrial mode switch are recorded, there is no true atrial fibrillation. Thoracic impedance shows broad strings with periods of hypervolemia, but most recently his thoracic impedance has just returned to baseline. Activity level remains very high at roughly 8 hours per day, with the exception of a period around his hospitalization in the spring.  Past Medical History:  Diagnosis Date  . Anginal pain (HCC)   . Anxiety   . Arthritis    gout  . Chest pain   . CHF (congestive heart failure) (HCC)   . Dysrhythmia   . GERD (gastroesophageal reflux disease)    with gas and bloating probably cause of chest pain  . H/O atrial flutter    ablation in sinus rhythm   . H/O: gout   . Hyperlipidemia   . Hypertension   . 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 Ischemic cardiomyopathy    H/O with EF at this cath of 45%  . Myocardial infarction   . Pacemaker   . 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 Shortness of breath     Past Surgical History:  Procedure Laterality Date  . BI-VENTRICULAR IMPLANTABLE CARDIOVERTER DEFIBRILLATOR Right 08/18/2012   Procedure: BI-VENTRICULAR IMPLANTABLE CARDIOVERTER DEFIBRILLATOR  (CRT-D);  Surgeon: Marinus Maw, MD;  Location: [facility] CATH LAB;  Service: Cardiovascular;  Laterality: Right;  . CARDIAC CATHETERIZATION  Dec 18, 2003   with a presentation of atrial flutter with rapid ventricular response. He had a cath and two stents to his PDA after his SVG to his RCA. His other graft were patent. He had LV with EF of 30%.   . CAROTID ENDARTERECTOMY     left in December 2003 and know renal artery stenosis of 60-70%  . CORONARY ARTERY BYPASS GRAFT  1993   redo  in 2000, he has had multiple MI previous to both procedures.  . IMPLANTABLE CARDIOVERTER DEFIBRILLATOR (ICD) GENERATOR CHANGE N/A 06/25/2012   Procedure: ICD GENERATOR CHANGE;  Surgeon: Marinus MawGregg W Taylor, MD;  [facility];  Service: Cardiovascular;  Laterality: N/A;  . VALVE REPLACEMENT  2010    Current Medications: Outpatient Medications Prior to Visit  Medication Sig Dispense Refill  . amiodarone (PACERONE) 200 MG tablet Take 1 tablet (200 mg total) by mouth daily. 30 tablet 2  . aspirin EC 81 MG tablet Take by mouth daily.     . furosemide (LASIX) 40 MG tablet Take 1 tablet (40 mg total) by mouth daily. 30 tablet 5  . KLOR-CON M20 20 MEQ tablet TAKE ONE TABLET BY MOUTH ONE TIME DAILY 30 tablet 6  . vitamin B-12 (CYANOCOBALAMIN) 1000 MCG tablet Take 1 tablet (1,000 mcg total) by mouth daily.    [name]. acetaminophen (TYLENOL) 325 MG tablet Take 2 tablets (650 mg total) by mouth every 6 (six) hours as needed for mild pain, fever or headache. (Patient not taking: Reported on 09/25/2016)    . carvedilol (COREG) 6.25 MG tablet Take 1 tablet (6.25 mg total) by mouth 2 (two) times daily with a meal. (Patient not taking: Reported on 09/25/2016)    . enalapril (VASOTEC) 10 MG tablet Take 10 mg by mouth daily.    [name]. ezetimibe (ZETIA) 10 MG tablet Take 1 tablet (10 mg total) by mouth daily. (Patient not taking: Reported on 09/25/2016) 30 tablet 5  . folic acid (FOLVITE) 1 MG tablet Take 1 tablet (1 mg total) by mouth daily. (Patient not taking: Reported on 09/25/2016)    . iron polysaccharides (NIFEREX) 150 MG capsule Take 1 capsule (150 mg total) by mouth 2 (two) times daily. (Patient not taking: Reported on 09/25/2016)    . LORazepam (ATIVAN) 0.5 MG tablet Take 1 tablet (0.5 mg total) by mouth every 12 (twelve) hours as needed for anxiety. (Patient not taking: Reported on 09/25/2016) 15 tablet 0  . magnesium oxide (MAG-OX) 400 MG tablet Take 400 mg by mouth 2 (two) times daily.    . megestrol (MEGACE) 40  MG/ML suspension Take 600 mg by mouth daily.    . nitroGLYCERIN (NITROSTAT) 0.4 MG SL tablet Place 1 tablet (0.4 mg total) under the tongue every 5 (five) minutes as needed. Chest pain (Patient not taking: Reported on 09/25/2016) 25 tablet 3  . pantoprazole (PROTONIX) 40 MG tablet Take 1 tablet (40 mg total) by mouth daily. (Patient not taking: Reported on 09/25/2016)    . spironolactone (ALDACTONE) 25 MG tablet Take 1 tablet (25 mg total) by mouth daily. (Patient not taking: Reported on 09/25/2016) 30 tablet 9  . thiamine 100 MG tablet Take 1 tablet (100 mg total) by mouth daily. (Patient not taking: Reported on 09/25/2016)    . traMADol (ULTRAM) 50 MG tablet Take 1 tablet (50 mg total) by mouth every 6 (six) hours as needed for severe pain. (Patient not taking: Reported on 09/25/2016)  20 tablet 0   No facility-administered medications prior to visit.      Allergies:   Atorvastatin   Social History   Social History  . Marital status: Married    Spouse name: N/A  . Number of children: N/A  . Years of education: N/A   Social History Main Topics  . Smoking status: Never Smoker  . Smokeless tobacco: Current User    Types: Chew  . Alcohol use 8.4 oz/week    14 Cans of beer per week  . Drug use: No  . Sexual activity: Not Asked   Other Topics Concern  . None   Social History Narrative  . None       ROS:   Please see the history of present illness.    ROS All other systems reviewed and are negative.   PHYSICAL EXAM:   VS:  BP 140/71   Pulse 72   Ht 6\' 1"  (1.854 m)   Wt 85.3 kg (188 lb)   BMI 24.80 kg/m    GEN: Well nourished, well developed, in no acute distress  HEENT: normal  Neck: no JVD, carotid bruits, or masses Cardiac: Paradoxically split second heart sound, RRR; no murmurs, rubs, or gallops,no edema , healthy left subclavian device site Respiratory:  clear to auscultation bilaterally, normal work of breathing GI: soft, nontender, nondistended, + BS MS: no  deformity or atrophy  Skin: warm and dry, no rash Neuro:  Alert and Oriented x 3, Strength and sensation are intact Psych: euthymic mood, full affect  Wt Readings from Last 3 Encounters:  09/25/16 85.3 kg (188 lb)  06/26/16 85.6 kg (188 lb 12.8 oz)  01/29/16 84.2 kg (185 lb 9.6 oz)      Studies/Labs Reviewed:   EKG:  EKG is ordered today.  The ekg ordered today demonstrates , atrial sensed, Biventricular paced rhythm with positive R waves in lead V1 and V2 consistent with effective left ventricular pacing  Recent Labs: 01/31/2016: Magnesium 2.0 06/26/2016: ALT 11; BUN 12; Creat 1.14; Hemoglobin 9.3; Platelets 237; Potassium 3.3; Sodium 137; TSH 5.97   Lipid Panel    Component Value Date/Time   CHOL 281 (H) 06/02/2014 1154   TRIG 163 (H) 06/02/2014 1154   HDL 64 06/02/2014 1154   CHOLHDL 4.4 06/02/2014 1154   VLDL 33 06/02/2014 1154   LDLCALC 184 (H) 06/02/2014 1154    Additional studies/ records that were reviewed today include:  Records from recent hospitalization    ASSESSMENT:    1. Class 1 congestive heart failure, chronic, combined (HCC)   2. Coronary artery disease involving autologous vein coronary bypass graft with other forms of angina pectoris (HCC)   3. Cardiac resynchronization therapy defibrillator (CRT-D) in place   4. Dyslipidemia   5. Essential hypertension   6. Bilateral carotid artery stenosis   7. History of aortic valve replacement with bioprosthetic valve   8. Medication management   9. Aneurysm of anterior communicating artery      PLAN:  In order of problems listed above:  1. CHF: Well compensated, NYHA functional class I-II. 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 He appears clinically euvolemic today, although his Optivol shows substantial volatility throughout the year. He has been self dosing his diuretic in what appears to be an appropriate manner, although last him to rely on his weight changes in addition to  ankle swelling. He has stopped taking important medications including his ACE inhibitor and beta blocker. He feels  better off them and is cheerfully resistant to my suggestions that he should restart them. We'll try to gently convince him of their importance over time. I am more preoccupied with maintaining compliance with medical follow-up at this time. Also review the importance of sodium restriction, daily weight monitoring and the signs and symptoms of worsening heart failure. 2. CAD s/p redo CABG: Does not have angina pectoris despite a very active lifestyle. He has stopped his statin, but is taking aspirin 3. CRT-D: Normal device function. Encouraged compliance with remote and in office follow-up. 4. HLP: He had labs after this office visit and his LDL cholesterol is not in target range. Nevertheless it appears substantially lower than it has in the past. I'm not sure with this is an indication of some degree of liver dysfunction from his alcohol use or improvement in diet. Will call him and see if he is willing to restart atorvastatin. Target LDL 70. 5. HTN: His blood pressure is close to normal range. Ideally he should be on beta blockers and ace inhibitors. As mentioned above she does not want to start back on these medications. 6. Carotid disease s/p bilateral carotid endarterectomies. No recent neurological complaints. Encouraged him to follow-up carotid US. 7. AFib: None in a long time as reported by his defibrillator. On amiodarone with normal liver function tests. Mildly abnormal TSH without signs or symptoms of hypothyroidism. Repeat TSH with free T4 in 3 months. I do not think he is a good candidate for anticoagulation due to poor compliance with follow-up and medications and heavy alcohol use. 8. S/P AVR: Normal function of his bioprosthesis by most recent echo and no significant murmur on physical exam. No symptoms of aortic stenosis or insufficiency. 9. Anterior communicating artery  aneurysm: 4.56mm aneurysm discovered incidentally at the time of preoperative carotid angiography. Asymptomatic.    Medication Adjustments/Labs and Tests Ordered: Current medicines are reviewed at length with the patient today.  Concerns regarding medicines are outlined above.  Medication changes, Labs and Tests ordered today are listed in the Patient Instructions below. Patient Instructions  Medication Instructions: Dr Royann Shivers recommends that you continue on your current medications as directed. Please refer to the Current Medication list given to you today.  Labwork: Your physician recommends that you return for lab work at your earliest convenience - FASTING.  Testing/Procedures: 1. Remote Device Transmission - Remote monitoring is used to monitor your Pacemaker of ICD from home. This monitoring reduces the number of office visits required to check your device to one time per year. It allows [place] to keep an eye on the functioning of your device to ensure it is working properly. You are scheduled for a device check from home on Thursday, January 4th, 2018. You may send your transmission at any time that day. If you have a wireless device, the transmission will be sent automatically. After your physician reviews your transmission, you will receive a postcard with your next transmission date.  Follow-up: Dr Royann Shivers recommends that you schedule a follow-up appointment in 6 months with a device check. You will receive a reminder letter in the mail two months in advance. If you don't receive a letter, please call our office to schedule the follow-up appointment.  If you need a refill on your cardiac medications before your next appointment, please call your pharmacy.    Signed, Thurmon Fair, MD  09/25/2016 1:13 PM    [facility] [address] [place], [place], [place]  [address] Phone: [phone])  [phone]; Fax: [phone]

## 2016-09-25 NOTE — Patient Instructions (Signed)
Medication Instructions: Dr Royann Shiversroitoru recommends that you continue on your current medications as directed. Please refer to the Current Medication list given to you today.  Labwork: Your physician recommends that you return for lab work at your earliest convenience - FASTING.  Testing/Procedures: 1. Remote Device Transmission - Remote monitoring is used to monitor your Pacemaker of ICD from home. This monitoring reduces the number of office visits required to check your device to one time per year. It allows us to keep an eye on the functioning of your device to ensure it is working properly. You are scheduled for a device check from home on Thursday, January 4th, 2018. You may send your transmission at any time that day. If you have a wireless device, the transmission will be sent automatically. After your physician reviews your transmission, you will receive a postcard with your next transmission date.  Follow-up: Dr Royann Shiversroitoru recommends that you schedule a follow-up appointment in 6 months with a device check. You will receive a reminder letter in the mail two months in advance. If you don't receive a letter, please call our office to schedule the follow-up appointment.  If you need a refill on your cardiac medications before your next appointment, please call your pharmacy.

## 2016-09-26 ENCOUNTER — Other Ambulatory Visit (HOSPITAL_COMMUNITY)
Admission: RE | Admit: 2016-09-26 | Discharge: 2016-09-26 | Disposition: A | Discharge status: Home / Self Care | Payer: Medicare Other | Source: Ambulatory Visit | Attending: Cardiovascular Disease | Admitting: Cardiovascular Disease

## 2016-09-26 DIAGNOSIS — I25718 Atherosclerosis of autologous vein coronary artery bypass graft(s) with other forms of angina pectoris: Secondary | ICD-10-CM | POA: Insufficient documentation

## 2016-09-26 DIAGNOSIS — Z79899 Other long term (current) drug therapy: Secondary | ICD-10-CM | POA: Diagnosis present

## 2016-09-26 DIAGNOSIS — E785 Hyperlipidemia, unspecified: Secondary | ICD-10-CM | POA: Insufficient documentation

## 2016-09-26 LAB — COMPREHENSIVE METABOLIC PANEL
ALT: 21 U/L (ref 17–63)
ANION GAP: 10 (ref 5–15)
AST: 42 U/L — AB (ref 15–41)
Albumin: 4 g/dL (ref 3.5–5.0)
Alkaline Phosphatase: 62 U/L (ref 38–126)
BILIRUBIN TOTAL: 0.7 mg/dL (ref 0.3–1.2)
BUN: 13 mg/dL (ref 6–20)
CHLORIDE: 100 mmol/L — AB (ref 101–111)
CO2: 27 mmol/L (ref 22–32)
Calcium: 9.3 mg/dL (ref 8.9–10.3)
Creatinine, Ser: 1.1 mg/dL (ref 0.61–1.24)
Glucose, Bld: 109 mg/dL — ABNORMAL HIGH (ref 65–99)
POTASSIUM: 3.5 mmol/L (ref 3.5–5.1)
Sodium: 137 mmol/L (ref 135–145)
TOTAL PROTEIN: 8.3 g/dL — AB (ref 6.5–8.1)

## 2016-09-26 LAB — CBC
HEMATOCRIT: 31.4 % — AB (ref 39.0–52.0)
Hemoglobin: 9.3 g/dL — ABNORMAL LOW (ref 13.0–17.0)
MCH: 22.7 pg — AB (ref 26.0–34.0)
MCHC: 29.6 g/dL — AB (ref 30.0–36.0)
MCV: 76.6 fL — AB (ref 78.0–100.0)
PLATELETS: 293 10*3/uL (ref 150–400)
RBC: 4.1 MIL/uL — ABNORMAL LOW (ref 4.22–5.81)
RDW: 18.1 % — AB (ref 11.5–15.5)
WBC: 4.8 10*3/uL (ref 4.0–10.5)

## 2016-09-26 LAB — LIPID PANEL
CHOLESTEROL: 205 mg/dL — AB (ref 0–200)
HDL: 69 mg/dL (ref >40)
LDL Cholesterol: 127 mg/dL — ABNORMAL HIGH (ref 0–99)
Total CHOL/HDL Ratio: 3 RATIO
Triglycerides: 45 mg/dL (ref <150)
VLDL: 9 mg/dL (ref 0–40)

## 2016-09-26 LAB — TSH: TSH: 5.609 u[IU]/mL — AB (ref 0.350–4.500)

## 2016-10-07 ENCOUNTER — Other Ambulatory Visit: Payer: Self-pay | Admitting: Cardiovascular Disease

## 2016-10-07 NOTE — Telephone Encounter (Signed)
Rx has been sent to the pharmacy electronically. ° °

## 2016-12-05 ENCOUNTER — Telehealth: Payer: Self-pay | Admitting: Cardiovascular Disease

## 2016-12-05 NOTE — Telephone Encounter (Signed)
Pt wanted to know if he knew how to work his home monitor properly. After he walked me through the steps I informed him that he was correct. Pt verbalized understanding.

## 2016-12-05 NOTE — Telephone Encounter (Signed)
New message       1. Has your device fired? no 2. Is you device beeping? no 3. Are you experiencing draining or swelling at device site? no 4. Are you calling to see if we received your device transmission? no 5. Have you passed out? No Pt is calling to get help in sending his transmission.  Please call after 1

## 2016-12-10 ENCOUNTER — Encounter: Payer: Self-pay | Admitting: Student

## 2016-12-10 ENCOUNTER — Telehealth: Payer: Self-pay | Admitting: Cardiovascular Disease

## 2016-12-10 ENCOUNTER — Ambulatory Visit (INDEPENDENT_AMBULATORY_CARE_PROVIDER_SITE_OTHER): Payer: Medicare Other | Admitting: Student

## 2016-12-10 VITALS — BP 138/70 | HR 72 | Ht 73.0 in | Wt 198.2 lb

## 2016-12-10 DIAGNOSIS — I5043 Acute on chronic combined systolic (congestive) and diastolic (congestive) heart failure: Secondary | ICD-10-CM

## 2016-12-10 DIAGNOSIS — I255 Ischemic cardiomyopathy: Secondary | ICD-10-CM | POA: Diagnosis not present

## 2016-12-10 DIAGNOSIS — I495 Sick sinus syndrome: Secondary | ICD-10-CM | POA: Diagnosis not present

## 2016-12-10 DIAGNOSIS — I480 Paroxysmal atrial fibrillation: Secondary | ICD-10-CM

## 2016-12-10 DIAGNOSIS — I25810 Atherosclerosis of coronary artery bypass graft(s) without angina pectoris: Secondary | ICD-10-CM

## 2016-12-10 DIAGNOSIS — I350 Nonrheumatic aortic (valve) stenosis: Secondary | ICD-10-CM

## 2016-12-10 MED ORDER — AMIODARONE HCL 200 MG PO TABS: 200.0000 mg | ORAL_TABLET | Freq: Every day | ORAL | 11 refills | Status: DC

## 2016-12-10 MED ORDER — FUROSEMIDE 40 MG PO TABS: 40.0000 mg | ORAL_TABLET | Freq: Two times a day (BID) | ORAL | 11 refills | Status: DC

## 2016-12-10 MED ORDER — POTASSIUM CHLORIDE CRYS ER 20 MEQ PO TBCR: 20.0000 meq | EXTENDED_RELEASE_TABLET | Freq: Two times a day (BID) | ORAL | 11 refills | Status: DC

## 2016-12-10 NOTE — Patient Instructions (Signed)
Medication Instructions:  Your physician has recommended you make the following change in your medication:  1) INCREASE Lasix to 40mg  twice daily. RESUME 40mg  daily when your weight is back down to your baseline. 2) INCREASE Potassium to 20 mEq twice daily.RESUME 20 mEq daily when your weight is back down to your baseline.  Your cardiac medications have been refilled and sent to your pharmacy   Labwork: BNP and Bmet today. Please have your labs drawn @ [facility] lab on the 1st floor  Testing/Procedures: None ordered  Follow-Up: Your physician recommends that you schedule a follow-up appointment in: 4 weeks with Dr.Croitoru/ or Randall AnBrittany Strader, PA-C   Any Other Special Instructions Will Be Listed Below (If Applicable). Your physician recommends that you weigh, daily, at the same time every day, and in the same amount of clothing. Please record your daily weights.        If you need a refill on your cardiac medications before your next appointment, please call your pharmacy.

## 2016-12-10 NOTE — Telephone Encounter (Signed)
H/O AF, CHF  Spoke to patient. He is having breathing troubles 4-5 days. Couldn't breathe at all hardly last night. Sleeping on some extra pillows. Patient wants to send in a transmission for his pacemaker - advised this is appropriate. He has pains across his stomach & constipation - unsure if this is abdominal swelling. He has not reported much, if any weight gain.   Added patient to B. [place] OuchStrader, PA schedule today at 3pm.   [facility] to look out for transmission and provider/covering nurse

## 2016-12-10 NOTE — Telephone Encounter (Signed)
Pt have beenhaving problems breathing,today it is much worse. Please call to advise.

## 2016-12-10 NOTE — Progress Notes (Signed)
I also tried to get him back on HF meds without success. MCr

## 2016-12-10 NOTE — Telephone Encounter (Signed)
Spoke w/ CMA at [facility] office. Informed her that pt home monitor has not sent a remote transmission since 09-25-16. Informed her to have someone send a carelink express for the pt. CMA verbalized understanding.

## 2016-12-10 NOTE — Progress Notes (Signed)
Cardiology Office Note    Date:  12/10/2016   ID:  [name] 02-Mar-1941, MRN [id number]  PCP:  Fabio Neighbors  Cardiologist: Dr. Royann Shivers   Chief Complaint  Patient presents with  . Follow-up  . Edema  . Shortness of Breath    History of Present Illness:    [name] is a 75 y.o. 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   Was last seen by Dr. Royann Shivers in 09/2016 and reported doing well from a cardiac perspective. Did say he does not consume water regularly but drinks a 6-pack of beer daily. Reported stopping most of his cardiac medications including Coreg, ACE-I, Spironolactone and statin therapy. Did continue taking ASA, Lasix 40mg  daily with K+ supplementation, and Amiodarone 200mg  daily.   He presents today for worsening orthopnea, dyspnea with exertion, and lower extremity edema. Reports this began 1 week ago and has continued to progress over the past several days. He occasionally takes an extra Lasix tablet in the evening, and reportedly ran out of the medication last week and his pharmacy would not refill it. Upon obtaining the refill two days ago, he has started back on Lasix 40 mg daily but his symptoms have persisted. He has not taken an additional tablet as needed as he is concerned his insurance will not fill his Rx again for another 30 days.   Weight is up 10 lbs on his home scales which correlates with the office scales (188 lbs --> 198  lbs). He is not interested in resuming any of his prior cardiac medications including Coreg, ACE-I, or Spironolactone at this time as he "has more energy" since stopping them until his acute symptoms occurred. 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 Denies any chest discomfort with exertion.    Past Medical History:  Diagnosis Date  . Anginal pain (HCC)   . Anxiety   . Aortic stenosis    a. s/p bioprosthetic AVR in 2010  . Arthritis    gout  . CAD (coronary artery disease)    a. s/p CABG in 1992 b. redo CABG in 2004 c. cath in 2010 showing patent LIMA-LAD, free RIMA-OM, and SVG-OM, and 80% stenosis of small PLA branch  . Chest pain   . CHF (congestive heart failure) (HCC)   . Dysrhythmia   . GERD (gastroesophageal reflux disease)    with gas and bloating probably cause of chest pain  . H/O atrial flutter    a. s/p ablation and not on anticoagulation secondary to GIB and alcohol abuse.   . H/O: gout   . Hyperlipidemia   . Hypertension   . 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 Ischemic cardiomyopathy    a. EF previously 20-25%, improved to 40-45% by echo in 03/2013)  . Myocardial infarction   . Pacemaker   . 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 Shortness of breath  Past Surgical History:  Procedure Laterality Date  . BI-VENTRICULAR IMPLANTABLE CARDIOVERTER DEFIBRILLATOR Right 08/18/2012   Procedure: BI-VENTRICULAR IMPLANTABLE CARDIOVERTER DEFIBRILLATOR  (CRT-D);  Surgeon: Marinus MawGregg W Taylor, MD;  [facility];  Service: Cardiovascular;  Laterality: Right;  . CARDIAC CATHETERIZATION  Dec 18, 2003   with a presentation of atrial flutter with rapid ventricular response. He had a cath and two stents to his PDA after his SVG to his RCA. His other graft were patent. He had LV with EF of 30%.   . CAROTID ENDARTERECTOMY     left in December 2003 and know renal artery stenosis of 60-70%  .  CORONARY ARTERY BYPASS GRAFT  1993   redo in 2000, he has had multiple MI previous to both procedures.  . IMPLANTABLE CARDIOVERTER DEFIBRILLATOR (ICD) GENERATOR CHANGE N/A 06/25/2012   Procedure: ICD GENERATOR CHANGE;  Surgeon: Marinus MawGregg W Taylor, MD;  [facility];  Service: Cardiovascular;  Laterality: N/A;  . VALVE REPLACEMENT  2010    Current Medications: Outpatient Medications Prior to Visit  Medication Sig Dispense Refill  . aspirin EC 81 MG tablet Take by mouth daily.     . vitamin B-12 (CYANOCOBALAMIN) 1000 MCG tablet Take 1 tablet (1,000 mcg total) by mouth daily.    . furosemide (LASIX) 40 MG tablet Take 1 tablet (40 mg total) by mouth daily. 30 tablet 5  . KLOR-CON M20 20 MEQ tablet TAKE ONE TABLET BY MOUTH ONE TIME DAILY 30 tablet 6  . amiodarone (PACERONE) 200 MG tablet TAKE 1 TABLET (200 MG TOTAL) BY MOUTH DAILY. (Patient not taking: Reported on 12/10/2016) 30 tablet 9   No facility-administered medications prior to visit.      Allergies:   Atorvastatin   Social History   Social History  . Marital status: Married    Spouse name: N/A  . Number of children: N/A  . Years of education: N/A   Social History Main Topics  . Smoking status: Never Smoker  . Smokeless tobacco: Current User    Types: Chew  . Alcohol use 8.4 oz/week    14 Cans of beer per week  . Drug use: No  . Sexual activity: Not Asked   Other Topics Concern  . None   Social History Narrative  . None     Family History:  The patient's family history includes CAD in his brother.   Review of Systems:   Please see the history of present illness.     General:  No chills, fever, night sweats or weight changes.  Cardiovascular:  No chest pain, palpitations, paroxysmal nocturnal dyspnea. Positive for dyspnea with exertion, orthopnea, and lower extremity edema.  Dermatological: No rash, lesions/masses Respiratory: No cough, Positive for dyspnea. Urologic: No hematuria, dysuria Abdominal:   No  nausea, vomiting, diarrhea, bright red blood per rectum, melena, or hematemesis Neurologic:  No visual changes, wkns, changes in mental status. All other systems reviewed and are otherwise negative except as noted above.   Physical Exam:    VS:  BP 138/70   Pulse 72   Ht 6\' 1"  (1.854 m)   Wt 198 lb 3.2 oz (89.9 kg)   BMI 26.15 kg/m    General: Well developed, elderly Caucasian male appearing in no acute distress. Head: Normocephalic, atraumatic, sclera non-icteric, no xanthomas, nares are without discharge.  Neck: No carotid bruits. JVD at 9cm.  Lungs: Respirations regular and unlabored, decreased breath sounds along right base.  Heart: Regular rate and rhythm. No S3  or S4.  No murmur, no rubs, or gallops appreciated. Abdomen: Soft, non-tender, non-distended with normoactive bowel sounds. No hepatomegaly. No rebound/guarding. No obvious abdominal masses. Msk:  Strength and tone appear normal for age. No joint deformities or effusions. Extremities: No clubbing or cyanosis. 1+ pitting edema up to mid-shins bilaterally.  Distal pedal pulses are 2+ bilaterally. Neuro: Alert and oriented X 3. Moves all extremities spontaneously. No focal deficits noted. Psych:  Responds to questions appropriately with a normal affect. Skin: No rashes or lesions noted  Wt Readings from Last 3 Encounters:  12/10/16 198 lb 3.2 oz (89.9 kg)  09/25/16 188 lb (85.3 kg)  06/26/16 188 lb 12.8 oz (85.6 kg)     Studies/Labs Reviewed:   EKG:  EKG is ordered today.  The ekg ordered today demonstrates V-paced rhythm, HR 72.  Recent Labs: 01/31/2016: Magnesium 2.0 09/26/2016: ALT 21; BUN 13; Creatinine, Ser 1.10; Hemoglobin 9.3; Platelets 293; Potassium 3.5; Sodium 137; TSH 5.609   Lipid Panel    Component Value Date/Time   CHOL 205 (H) 09/26/2016 1012   TRIG 45 09/26/2016 1012   HDL 69 09/26/2016 1012   CHOLHDL 3.0 09/26/2016 1012   VLDL 9 09/26/2016 1012   LDLCALC 127 (H) 09/26/2016 1012    Additional  studies/ records that were reviewed today include:   Echocardiogram: 03/2013 Study Conclusions  - Left ventricle: The cavity size was moderately dilated. There was moderate concentric hypertrophy. Systolic function was mildly to moderately reduced. The estimated ejection fraction was in the range of 40% to 45%. Moderate hypokinesis of the inferolateral myocardium. Mild hypokinesis of the inferior myocardium. Doppler parameters are consistent with abnormal left ventricular relaxation (grade 1 diastolic dysfunction). - Aortic valve: A bioprosthesis was present. Valve area: 1.48cm^2(VTI). Valve area: 1.35cm^2 (Vmax). - Mitral valve: Calcified annulus. Mild regurgitation. Valve area by pressure half-time: 1.31cm^2. Valve area by continuity equation (using LVOT flow): 2.68cm^2. - Left atrium: The atrium was mildly to moderately dilated. - Right atrium: The atrium was mildly dilated. - Atrial septum: No defect or patent foramen ovale was identified. Impressions:  - Bi-V ICD with Bi-V pacding. Segmental wall motion abnormalities with EF approximately =>40%. Stable peridardial AVR. No significant intra-ventricular dyssynchrony.  Assessment:    1. Acute on chronic combined systolic and diastolic CHF (congestive heart failure) (HCC)   2. Cardiomyopathy, ischemic   3. Coronary artery disease involving coronary bypass graft of native heart without angina pectoris   4. SA node dysfunction (HCC)   5. PAF (paroxysmal atrial fibrillation) (HCC)   6. Severe aortic valve stenosis     Plan:   In order of problems listed above:  1. Acute on Chronic Combined Systolic and Diastolic CHF/ Ischemic Cardiomyopathy - EF previously 20-25%, improved to 40-45% by echo in 03/2013.  - he ran out of Lasix for several days last week and has since developed worsening orthopnea, dyspnea on exertion, and lower extremity edema. Weight is up 10 lbs from baseline and he does appear  volume overloaded on physical exam.  - will increase Lasix from 40mg  daily to 40mg  BID until weight is back at baseline. Was instructed to take an additional K+ with his extra Lasix dose. Once weight is at baseline and symptoms have improved, will take an extra tablet only as needed for weight gain > 3 lbs overnight, > 5 lbs in one week, or worsening edema. He will inform the office if symptoms do not improve with increased Lasix dosing. Will check BNP and BMET today.  -  he recently stopped his BB, ACE-I, and Spironolactone earlier this year and is not interested in restarting them. I reviewed with him that stopping these medications could further lower his EF and cause more acute exacerbations like he is currently experiencing. He says he will "consider resuming them in the future and we can discuss it later".   2. CAD without angina pectoris - s/p CABG in 1992 and redo CABG in 2004, cath in 2010 showing patent LIMA-LAD, free RIMA-OM, and SVG-OM, and 80% stenosis of small PLA branch.  - he denies any recent anginal symptoms.  - remains on ASA. Quit his BB and statin within the past year and will "consider resuming them in the future".   3. SA Node Dysfunction - s/p Medtronic ICD placement in 06/2012 - attempted to send remote transmission this morning which did not go through. Will have his device interrogated while in the office today.   4. PAF - This patients CHA2DS2-VASc Score and unadjusted Ischemic Stroke Rate (% per year) is equal to 4.8 % stroke rate/year from a score of 4 (CHF, HTN, Vascular, Age). Not on anticoagulation secondary to history of GIB and alcohol abuse. Remains on 81mg  ASA. The patient is aware of increased stroke risk with known history of PAF. Fortunately, recent device checks have not showed any recurrent episodes of PAF.   5. Severe AS - s/p bioprosthetic AVR in 2010.    Medication Adjustments/Labs and Tests Ordered: Current medicines are reviewed at length with the  patient today.  Concerns regarding medicines are outlined above.  Medication changes, Labs and Tests ordered today are listed in the Patient Instructions below. Patient Instructions  Medication Instructions:  Your physician has recommended you make the following change in your medication:  1) INCREASE Lasix to 40mg  twice daily. RESUME 40mg  daily when your weight is back down to your baseline. 2) INCREASE Potassium to 20 mEq twice daily.RESUME 20 mEq daily when your weight is back down to your baseline.  Your cardiac medications have been refilled and sent to your pharmacy  Labwork: BNP and Bmet today. Please have your labs drawn @ [facility] lab on the 1st floor  Testing/Procedures: None ordered  Follow-Up: Your physician recommends that you schedule a follow-up appointment in: 4 weeks with Dr.Croitoru/ or Randall AnBrittany Tenlee Wollin, PA-C   Any Other Special Instructions Will Be Listed Below (If Applicable). Your physician recommends that you weigh, daily, at the same time every day, and in the same amount of clothing. Please record your daily weights   If you need a refill on your cardiac medications before your next appointment, please call your pharmacy.   Lorri FrederickSigned, Mikki Ziff M Bibiana Gillean, [place]  12/10/2016 9:38 PM    [facility] [address] [place], [place]  [address] Phone: [phone]; Fax: [phone]  [address], [address] [place], [place] [address] Phone: [phone]

## 2016-12-11 LAB — BASIC METABOLIC PANEL
BUN: 17 mg/dL (ref 7–25)
CHLORIDE: 100 mmol/L (ref 98–110)
CO2: 25 mmol/L (ref 20–31)
Calcium: 9.2 mg/dL (ref 8.6–10.3)
Creat: 1.7 mg/dL — ABNORMAL HIGH (ref 0.70–1.18)
GLUCOSE: 95 mg/dL (ref 65–99)
Potassium: 4 mmol/L (ref 3.5–5.3)
SODIUM: 137 mmol/L (ref 135–146)

## 2016-12-11 LAB — BRAIN NATRIURETIC PEPTIDE: Brain Natriuretic Peptide: 1439.2 pg/mL — ABNORMAL HIGH (ref <100)

## 2016-12-17 ENCOUNTER — Telehealth: Payer: Self-pay | Admitting: *Deleted

## 2016-12-17 DIAGNOSIS — R7989 Other specified abnormal findings of blood chemistry: Secondary | ICD-10-CM

## 2016-12-17 NOTE — Telephone Encounter (Signed)
Pt aware of his lab results. He will come back in for repeat bmet 12/19/16

## 2016-12-17 NOTE — Telephone Encounter (Signed)
-----   Message from Ellsworth LennoxBrittany M Strader, [place] sent at 12/11/2016  5:51 PM EST ----- Please let the patient know his labs are consistent with an acute CHF exacerbation. Recommend increasing his Lasix as we discussed yesterday. Kidney function is elevated which is likely secondary to his volume overload. Will need a repeat BMET in 7-10 days. Thank you!

## 2016-12-19 ENCOUNTER — Telehealth: Payer: Self-pay | Admitting: *Deleted

## 2016-12-19 LAB — CBC
HEMATOCRIT: 29.6 % — AB (ref 38.5–50.0)
Hemoglobin: 8.6 g/dL — ABNORMAL LOW (ref 13.2–17.1)
MCH: 21.3 pg — AB (ref 27.0–33.0)
MCHC: 29.1 g/dL — ABNORMAL LOW (ref 32.0–36.0)
MCV: 73.3 fL — AB (ref 80.0–100.0)
MPV: 9.3 fL (ref 7.5–12.5)
Platelets: 251 10*3/uL (ref 140–400)
RBC: 4.04 MIL/uL — AB (ref 4.20–5.80)
RDW: 16.5 % — ABNORMAL HIGH (ref 11.0–15.0)
WBC: 6 10*3/uL (ref 3.8–10.8)

## 2016-12-19 LAB — COMPREHENSIVE METABOLIC PANEL
ALK PHOS: 75 U/L (ref 40–115)
ALT: 13 U/L (ref 9–46)
AST: 28 U/L (ref 10–35)
Albumin: 4 g/dL (ref 3.6–5.1)
BUN: 23 mg/dL (ref 7–25)
CHLORIDE: 100 mmol/L (ref 98–110)
CO2: 29 mmol/L (ref 20–31)
CREATININE: 1.55 mg/dL — AB (ref 0.70–1.18)
Calcium: 9.2 mg/dL (ref 8.6–10.3)
GLUCOSE: 104 mg/dL — AB (ref 65–99)
Potassium: 3.9 mmol/L (ref 3.5–5.3)
SODIUM: 139 mmol/L (ref 135–146)
TOTAL PROTEIN: 7.4 g/dL (ref 6.1–8.1)
Total Bilirubin: 0.7 mg/dL (ref 0.2–1.2)

## 2016-12-19 LAB — LIPID PANEL
CHOL/HDL RATIO: 4.2 ratio (ref <5.0)
Cholesterol: 158 mg/dL (ref <200)
HDL: 38 mg/dL — AB (ref >40)
LDL CALC: 105 mg/dL — AB (ref <100)
Triglycerides: 74 mg/dL (ref <150)
VLDL: 15 mg/dL (ref <30)

## 2016-12-19 LAB — TSH: TSH: 9.39 m[IU]/L — AB (ref 0.40–4.50)

## 2016-12-19 NOTE — Telephone Encounter (Signed)
Patient came in for walk-in weight check post-med changes (lasix).  Also to notify us [facility]. Will update in chart w new weight of 190.2 on office scale, per patient request.  Pt aware I will notify him once lab results posted/reviewed by Jaynie BreamB Strader, PA.

## 2016-12-21 ENCOUNTER — Other Ambulatory Visit: Payer: Self-pay | Admitting: Physician Assistant

## 2016-12-23 ENCOUNTER — Telehealth: Payer: Self-pay

## 2016-12-23 DIAGNOSIS — Z862 Personal history of diseases of the blood and blood-forming organs and certain disorders involving the immune mechanism: Secondary | ICD-10-CM

## 2016-12-23 DIAGNOSIS — Z79899 Other long term (current) drug therapy: Secondary | ICD-10-CM

## 2016-12-23 NOTE — Telephone Encounter (Signed)
Review for refill. 

## 2016-12-23 NOTE — Telephone Encounter (Signed)
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 Please check free T4.  Please draw free T4 and BMET and Hgb in 3-4 weeks, before his Jan 25 appt.  Anemia is slightly worse and consistent with iron deficiency (which was confirmed on labs in January) Kidney function has improved since 12/20, but not at baseline. Cholesterol not at goal, although better than it has been in the last several years.

## 2016-12-23 NOTE — Telephone Encounter (Signed)
Called patient with results. Patient verbalized understanding. Labs ordered to be mailed to patient. 

## 2016-12-25 ENCOUNTER — Telehealth: Payer: Self-pay | Admitting: Cardiology

## 2016-12-25 ENCOUNTER — Ambulatory Visit (INDEPENDENT_AMBULATORY_CARE_PROVIDER_SITE_OTHER): Payer: Medicare Other | Admitting: *Deleted

## 2016-12-25 DIAGNOSIS — I255 Ischemic cardiomyopathy: Secondary | ICD-10-CM

## 2016-12-25 NOTE — Telephone Encounter (Signed)
Spoke with pt and reminded pt of remote transmission that is due today. Pt verbalized understanding.   

## 2016-12-26 ENCOUNTER — Encounter: Payer: Self-pay | Admitting: Cardiology

## 2016-12-26 NOTE — Progress Notes (Signed)
Remote ICD transmission.   

## 2017-01-12 LAB — BASIC METABOLIC PANEL
BUN: 14 mg/dL (ref 7–25)
CALCIUM: 9.1 mg/dL (ref 8.6–10.3)
CO2: 29 mmol/L (ref 20–31)
CREATININE: 1.32 mg/dL — AB (ref 0.70–1.18)
Chloride: 98 mmol/L (ref 98–110)
Glucose, Bld: 102 mg/dL — ABNORMAL HIGH (ref 65–99)
Potassium: 3.9 mmol/L (ref 3.5–5.3)
Sodium: 137 mmol/L (ref 135–146)

## 2017-01-12 LAB — T4, FREE: FREE T4: 1.1 ng/dL (ref 0.8–1.8)

## 2017-01-12 LAB — HEMOGLOBIN: Hemoglobin: 8.4 g/dL — ABNORMAL LOW (ref 13.2–17.1)

## 2017-01-13 LAB — CUP PACEART REMOTE DEVICE CHECK
Battery Voltage: 2.95 V
Brady Statistic AP VS Percent: 0.03 %
Brady Statistic AS VS Percent: 0.05 %
Brady Statistic RA Percent Paced: 19.99 %
Brady Statistic RV Percent Paced: 99.89 %
Date Time Interrogation Session: 20180104205032
HIGH POWER IMPEDANCE MEASURED VALUE: 68 Ohm
HighPow Impedance: 304 Ohm
Implantable Lead Implant Date: 20130828
Implantable Lead Implant Date: 20130828
Implantable Lead Implant Date: 20130828
Implantable Lead Location: 753859
Implantable Lead Model: 6935
Implantable Pulse Generator Implant Date: 20130828
Lead Channel Impedance Value: 304 Ohm
Lead Channel Impedance Value: 456 Ohm
Lead Channel Pacing Threshold Amplitude: 0.75 V
Lead Channel Pacing Threshold Amplitude: 0.875 V
Lead Channel Pacing Threshold Pulse Width: 0.4 ms
Lead Channel Pacing Threshold Pulse Width: 0.4 ms
Lead Channel Sensing Intrinsic Amplitude: 3.625 mV
Lead Channel Sensing Intrinsic Amplitude: 3.625 mV
Lead Channel Setting Pacing Amplitude: 2 V
Lead Channel Setting Pacing Amplitude: 2.25 V
Lead Channel Setting Pacing Pulse Width: 0.4 ms
Lead Channel Setting Sensing Sensitivity: 0.3 mV
MDC IDC LEAD LOCATION: 753858
MDC IDC LEAD LOCATION: 753860
MDC IDC MSMT LEADCHNL LV IMPEDANCE VALUE: 532 Ohm
MDC IDC MSMT LEADCHNL LV IMPEDANCE VALUE: 703 Ohm
MDC IDC MSMT LEADCHNL LV PACING THRESHOLD AMPLITUDE: 1.125 V
MDC IDC MSMT LEADCHNL RA PACING THRESHOLD PULSEWIDTH: 0.4 ms
MDC IDC MSMT LEADCHNL RA SENSING INTR AMPL: 0.875 mV
MDC IDC MSMT LEADCHNL RA SENSING INTR AMPL: 0.875 mV
MDC IDC MSMT LEADCHNL RV IMPEDANCE VALUE: 323 Ohm
MDC IDC SET LEADCHNL RA PACING AMPLITUDE: 1.75 V
MDC IDC SET LEADCHNL RV PACING PULSEWIDTH: 0.4 ms
MDC IDC STAT BRADY AP VP PERCENT: 19.97 %
MDC IDC STAT BRADY AS VP PERCENT: 79.95 %

## 2017-01-14 NOTE — Progress Notes (Signed)
Cardiology Office Note    Date:  01/15/2017   ID:  [name], [name] 1941/02/07, MRN [id number]  PCP:  Fabio Neighbors  Cardiologist: Dr. Royann Shivers  Chief Complaint  Patient presents with  . Follow-up    pt c/o SOB on minimal exertion, occasional dizziness, mild swelling in lower extremities    History of Present Illness:    [name] is a 76 y.o. 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  Was last seen by myself on 12/10/2016, reporting worsening orthopnea, dyspnea with exertion, and lower extremity edema in the setting of not taking his Lasix. Weight was up to 198 lbs (previously 188 lbs) with BNP at 1439. His Lasix was increased from 40mg  daily to 40mg  BID until weight approached baseline. He had recently stopped his BB, ACE-I, Spironolactone and statin earlier this year and was not interested in restarting them.  In talking with the patient today, he reports continual lower extremity edema, dyspnea with exertion, and orthopnea. He reports his weight has remained elevated at 198 lbs with no changes since Lasix dosing changes, however his weight was noted to be 190.2 on 12/19/2016. It has since going back up to 198 lbs.  Creatinine recently checked on 1/22 and at 1.32 (improved from 1.70 in 11/2016).   He is concerned that his weight is remaining elevated and is interested in taking actions to prevent this from occurring in the future. As mentioned above, he had previously  stopped multiple cardiac medications including his BB, ACE-I, and Spironolactone. He is willing to re-start his BB if this will help prevent future acute CHF exacerbations.    Past Medical History:  Diagnosis Date  . Anginal pain (HCC)   . Anxiety   . Aortic stenosis    a. s/p bioprosthetic AVR in 2010  . Arthritis    gout  . CAD (coronary artery disease)    a. s/p CABG in 1992 b. redo CABG in 2004 c. cath in 2010 showing patent LIMA-LAD, free RIMA-OM, and SVG-OM, and 80% stenosis of small PLA branch  . Chest pain   . CHF (congestive heart failure) (HCC)   . Dysrhythmia   . GERD (gastroesophageal reflux disease)    with gas and bloating probably cause of chest pain  . H/O atrial flutter    a. s/p ablation and not on anticoagulation secondary to GIB and alcohol abuse.   . H/O: gout   . Hyperlipidemia   . Hypertension   . 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 Ischemic cardiomyopathy    a. EF previously 20-25%, improved to 40-45% by echo in 03/2013)  . Myocardial infarction   . Pacemaker   . 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 Shortness of breath     Past Surgical History:  Procedure Laterality Date  . BI-VENTRICULAR IMPLANTABLE CARDIOVERTER DEFIBRILLATOR Right 08/18/2012   Procedure: BI-VENTRICULAR IMPLANTABLE CARDIOVERTER DEFIBRILLATOR  (CRT-D);  Surgeon: Doylene Canning  Ladona Ridgelaylor, MD;  [facility];  Service: Cardiovascular;  Laterality: Right;  . CARDIAC CATHETERIZATION  Dec 18, 2003   with a presentation of atrial flutter with rapid ventricular response. He had a cath and two stents to his PDA after his SVG to his RCA. His other graft were patent. He had LV with EF of 30%.   . CAROTID ENDARTERECTOMY     left in December 2003 and know renal artery stenosis of 60-70%  . CORONARY ARTERY BYPASS GRAFT  1993   redo in 2000, he has had multiple MI previous to both procedures.  . IMPLANTABLE  CARDIOVERTER DEFIBRILLATOR (ICD) GENERATOR CHANGE N/A 06/25/2012   Procedure: ICD GENERATOR CHANGE;  Surgeon: Marinus MawGregg W Taylor, MD;  [facility];  Service: Cardiovascular;  Laterality: N/A;  . VALVE REPLACEMENT  2010    Current Medications: Outpatient Medications Prior to Visit  Medication Sig Dispense Refill  . amiodarone (PACERONE) 200 MG tablet Take 1 tablet (200 mg total) by mouth daily. 30 tablet 11  . aspirin EC 81 MG tablet Take by mouth daily.     . potassium chloride SA (KLOR-CON M20) 20 MEQ tablet Take 1 tablet (20 mEq total) by mouth 2 (two) times daily. 60 tablet 11  . vitamin B-12 (CYANOCOBALAMIN) 1000 MCG tablet Take 1 tablet (1,000 mcg total) by mouth daily.    . furosemide (LASIX) 40 MG tablet Take 1 tablet (40 mg total) by mouth 2 (two) times daily. 60 tablet 11   No facility-administered medications prior to visit.      Allergies:   Atorvastatin   Social History   Social History  . Marital status: Married    Spouse name: N/A  . Number of children: N/A  . Years of education: N/A   Social History Main Topics  . Smoking status: Never Smoker  . Smokeless tobacco: Current User    Types: Chew  . Alcohol use 8.4 oz/week    14 Cans of beer per week  . Drug use: No  . Sexual activity: Not Asked   Other Topics Concern  . None   Social History Narrative  . None     Family History:  The patient's family history includes CAD in his brother.   Review of Systems:   Please see the history of present illness.     General:  No chills, fever, night sweats or weight changes.  Cardiovascular:  No chest pain, palpitations, paroxysmal nocturnal dyspnea. Positive for edema, orthopnea, and dyspnea on exertion.  Dermatological: No rash, lesions/masses Respiratory: No cough, dyspnea Urologic: No hematuria, dysuria Abdominal:   No nausea, vomiting, diarrhea, bright red blood per rectum, melena, or hematemesis Neurologic:  No visual changes, wkns, changes in mental  status. All other systems reviewed and are otherwise negative except as noted above.   Physical Exam:    VS:  BP (!) 153/83 (BP Location: Right Arm, Patient Position: Sitting, Cuff Size: Normal)   Pulse 71   Ht 6\' 1"  (1.854 m)   Wt 198 lb 12.8 oz (90.2 kg)   SpO2 96%   BMI 26.23 kg/m    General: Well developed, well nourished Caucasian male appearing in no acute distress. Head: Normocephalic, atraumatic, sclera non-icteric, no xanthomas, nares are without discharge.  Neck: No carotid bruits. JVD at 8cm. Lungs: Respirations regular and unlabored, without wheezes or rales.  Heart: Regular rate and rhythm. No S3 or S4.  No murmur, no rubs, or gallops appreciated. Abdomen: Soft, non-tender, non-distended with  normoactive bowel sounds. No hepatomegaly. No rebound/guarding. No obvious abdominal masses. Msk:  Strength and tone appear normal for age. No joint deformities or effusions. Extremities: No clubbing or cyanosis. 1+ pitting edema bilaterally.  Distal pedal pulses are 2+ bilaterally. Neuro: Alert and oriented X 3. Moves all extremities spontaneously. No focal deficits noted. Psych:  Responds to questions appropriately with a normal affect. Skin: No rashes or lesions noted  Wt Readings from Last 3 Encounters:  01/15/17 198 lb 12.8 oz (90.2 kg)  12/10/16 198 lb 3.2 oz (89.9 kg)  09/25/16 188 lb (85.3 kg)     Studies/Labs Reviewed:   EKG:  EKG is not ordered today.   Recent Labs: 01/31/2016: Magnesium 2.0 12/10/2016: Brain Natriuretic Peptide 1,439.2 12/19/2016: ALT 13; Platelets 251; TSH 9.39 01/12/2017: BUN 14; Creat 1.32; Hemoglobin 8.4; Potassium 3.9; Sodium 137   Lipid Panel    Component Value Date/Time   CHOL 158 12/19/2016 0841   TRIG 74 12/19/2016 0841   HDL 38 (L) 12/19/2016 0841   CHOLHDL 4.2 12/19/2016 0841   VLDL 15 12/19/2016 0841   LDLCALC 105 (H) 12/19/2016 0841    Additional studies/ records that were reviewed today include:  Echocardiogram:  03/2013 Study Conclusions  - Left ventricle: The cavity size was moderately dilated. There was moderate concentric hypertrophy. Systolic function was mildly to moderately reduced. The estimated ejection fraction was in the range of 40% to 45%. Moderate hypokinesis of the inferolateral myocardium. Mild hypokinesis of the inferior myocardium. Doppler parameters are consistent with abnormal left ventricular relaxation (grade 1 diastolic dysfunction). - Aortic valve: A bioprosthesis was present. Valve area: 1.48cm^2(VTI). Valve area: 1.35cm^2 (Vmax). - Mitral valve: Calcified annulus. Mild regurgitation. Valve area by pressure half-time: 1.31cm^2. Valve area by continuity equation (using LVOT flow): 2.68cm^2. - Left atrium: The atrium was mildly to moderately dilated. - Right atrium: The atrium was mildly dilated. - Atrial septum: No defect or patent foramen ovale was identified. Impressions:  - Bi-V ICD with Bi-V pacding. Segmental wall motion abnormalities with EF approximately =>40%. Stable peridardial AVR. No significant intra-ventricular dyssynchrony.  Assessment:    1. Acute on chronic combined systolic and diastolic CHF (congestive heart failure) (HCC)   2. Cardiomyopathy, ischemic   3. Coronary artery disease involving autologous vein coronary bypass graft without angina pectoris   4. SA node dysfunction (HCC)   5. H/O aortic valve replacement with porcine valve   6. PAF (paroxysmal atrial fibrillation) (HCC)   7. CKD (chronic kidney disease) stage 3, GFR 30-59 ml/min   8. Medication management      Plan:   In order of problems listed above:  1. Acute on Chronic Combined Systolic and Diastolic CHF/ Ischemic Cardiomyopathy - EF previously 20-25%, improved to 40-45% by echo in 03/2013.  - he was seen in 11/2016 and noted to be volume overloaded, with Lasix being increased from 40mg  daily to 40mg  BID at that time. He has continued to  experience similar symptoms and weight has been stable at 198 lbs. He reports consuming a low-sodium diet and consumes less than 1.5L of fluid daily, however he does mention over half of his fluid intake is "probably from beer" and I am concerned he is consuming more fluid than being reported.  - he still appears volume overloaded on physical exam. Will increase Lasix to 80mg  AM and 40mg  in PM with instructions to take Metolazone 2.5mg  once weekly prior to his AM Lasix dosing (starting with tomorrow's dose). He will let us know if  volume status does not improve, as we could possibly transition to Torsemide for increased bioavailability.  - he had previously stopped multiple cardiac medications including his BB, ACE-I, and Spironolactone due to them making him feel "bad", but he is willing to re-start his BB if this will help prevent future acute CHF exacerbations! Therefore, we will restart his Coreg at 3.125mg  BID. Hopefully, he will allow [place] to restart more of his medications as time progresses.  2. CAD - s/p CABG in 1992 and redo CABG in 2004, cath in 2010 showing patent LIMA-LAD, free RIMA-OM, and SVG-OM, and 80% stenosis of small PLA branch. - continue ASA. Restart BB as above. Patient previously self-discontinued statin therapy and is not interested in restarting it at this time.   3. SA Node Dysfunction - s/p Medtronic ICD placement 06/2012  4. Severe AS - s/p bioprosthetic AVR in 2010. - normal function by echo in 2014. No significant murmurs appreciated on examination today. Consider repeat echo if dyspnea does not improve.   5. PAF - maintaining NSR by examination today. - This patients CHA2DS2-VASc Score and unadjusted Ischemic Stroke Rate (% per year) is equal to 4.8 % stroke rate/year from a score of 4 (CHF, HTN, Age, Vascular). Not on anticoagulation secondary to history of GIB and alcohol use.   6. Stage 3 CKD - creatinine 1.70 in 11/2016, improved to 1.32 on 01/12/2017. - repeat  BMET in 2 weeks with recent diuretic dose adjustment.     Medication Adjustments/Labs and Tests Ordered: Current medicines are reviewed at length with the patient today.  Concerns regarding medicines are outlined above.  Medication changes, Labs and Tests ordered today are listed in the Patient Instructions below.  Patient Instructions  Medication Instructions:  RE-START COREG 3.125MG  DAILY INCREASE LASIX TO 80 MG IN THE AM AND 40MG  IN THE PM START METOLAZONE 2.5MG  WEEKLY TAKE 30 MINUTES BEFORE LASIX  If you need a refill on your cardiac medications before your next appointment, please call your pharmacy.  Labwork: BMET AT [facility] IN 2 WEEKS  Follow-Up: Your physician recommends that you schedule a follow-up appointment in: 1 MONTH WITH DR Royann Shivers  Signed, Ellsworth Lennox, PA  01/15/2017 5:29 PM    [facility] [address] [place], [place]  [address] Phone: [phone]; Fax: [phone]  [address], [address] [place], [place] [address] Phone: [phone]

## 2017-01-15 ENCOUNTER — Ambulatory Visit (INDEPENDENT_AMBULATORY_CARE_PROVIDER_SITE_OTHER): Payer: Medicare Other | Admitting: Student

## 2017-01-15 ENCOUNTER — Encounter: Payer: Self-pay | Admitting: Student

## 2017-01-15 VITALS — BP 153/83 | HR 71 | Ht 73.0 in | Wt 198.8 lb

## 2017-01-15 DIAGNOSIS — Z79899 Other long term (current) drug therapy: Secondary | ICD-10-CM

## 2017-01-15 DIAGNOSIS — I5043 Acute on chronic combined systolic (congestive) and diastolic (congestive) heart failure: Secondary | ICD-10-CM | POA: Insufficient documentation

## 2017-01-15 DIAGNOSIS — I255 Ischemic cardiomyopathy: Secondary | ICD-10-CM

## 2017-01-15 DIAGNOSIS — N183 Chronic kidney disease, stage 3 unspecified: Secondary | ICD-10-CM

## 2017-01-15 DIAGNOSIS — I25810 Atherosclerosis of coronary artery bypass graft(s) without angina pectoris: Secondary | ICD-10-CM | POA: Diagnosis not present

## 2017-01-15 DIAGNOSIS — I495 Sick sinus syndrome: Secondary | ICD-10-CM | POA: Diagnosis not present

## 2017-01-15 DIAGNOSIS — I480 Paroxysmal atrial fibrillation: Secondary | ICD-10-CM

## 2017-01-15 DIAGNOSIS — Z953 Presence of xenogenic heart valve: Secondary | ICD-10-CM

## 2017-01-15 MED ORDER — METOLAZONE 2.5 MG PO TABS: 2.5000 mg | ORAL_TABLET | ORAL | 3 refills | Status: DC

## 2017-01-15 MED ORDER — FUROSEMIDE 40 MG PO TABS: 80.0000 mg | ORAL_TABLET | Freq: Two times a day (BID) | ORAL | 3 refills | Status: DC

## 2017-01-15 MED ORDER — CARVEDILOL 3.125 MG PO TABS: 3.1250 mg | ORAL_TABLET | Freq: Two times a day (BID) | ORAL | 3 refills | Status: DC

## 2017-01-15 NOTE — Patient Instructions (Signed)
Medication Instructions:  RE-START COREG 3.125MG  DAILY INCREASE LASIX TO 80 MG IN THE AM AND 40MG  IN THE PM START METOLAZONE 2.5MG  WEEKLY TAKE 30 MINUTES BEFORE LASIX  If you need a refill on your cardiac medications before your next appointment, please call your pharmacy.  Labwork: BMET AT [facility] IN 2 WEEKS  Follow-Up: Your physician recommends that you schedule a follow-up appointment in: 1 MONTH WITH DR ZOXWRUEACROITORU    Thank you for choosing [facility]!!    Lara Palinkas, LPN Randall AnBRITTANY STRADER, PA-C

## 2017-02-06 ENCOUNTER — Ambulatory Visit (INDEPENDENT_AMBULATORY_CARE_PROVIDER_SITE_OTHER): Payer: Medicare Other | Admitting: Cardiovascular Disease

## 2017-02-06 ENCOUNTER — Encounter: Payer: Self-pay | Admitting: Cardiovascular Disease

## 2017-02-06 VITALS — BP 119/69 | HR 84 | Ht 73.0 in | Wt 190.2 lb

## 2017-02-06 DIAGNOSIS — E7800 Pure hypercholesterolemia, unspecified: Secondary | ICD-10-CM | POA: Diagnosis not present

## 2017-02-06 DIAGNOSIS — Z79899 Other long term (current) drug therapy: Secondary | ICD-10-CM

## 2017-02-06 DIAGNOSIS — I5042 Chronic combined systolic (congestive) and diastolic (congestive) heart failure: Secondary | ICD-10-CM

## 2017-02-06 DIAGNOSIS — Z95810 Presence of automatic (implantable) cardiac defibrillator: Secondary | ICD-10-CM

## 2017-02-06 DIAGNOSIS — Z953 Presence of xenogenic heart valve: Secondary | ICD-10-CM

## 2017-02-06 DIAGNOSIS — I6523 Occlusion and stenosis of bilateral carotid arteries: Secondary | ICD-10-CM

## 2017-02-06 DIAGNOSIS — I480 Paroxysmal atrial fibrillation: Secondary | ICD-10-CM

## 2017-02-06 DIAGNOSIS — I25810 Atherosclerosis of coronary artery bypass graft(s) without angina pectoris: Secondary | ICD-10-CM | POA: Diagnosis not present

## 2017-02-06 DIAGNOSIS — I10 Essential (primary) hypertension: Secondary | ICD-10-CM

## 2017-02-06 DIAGNOSIS — Z5181 Encounter for therapeutic drug level monitoring: Secondary | ICD-10-CM

## 2017-02-06 LAB — BASIC METABOLIC PANEL
BUN: 20 mg/dL (ref 7–25)
CALCIUM: 9.1 mg/dL (ref 8.6–10.3)
CO2: 25 mmol/L (ref 20–31)
Chloride: 101 mmol/L (ref 98–110)
Creat: 1.48 mg/dL — ABNORMAL HIGH (ref 0.70–1.18)
Glucose, Bld: 89 mg/dL (ref 65–99)
Potassium: 3.4 mmol/L — ABNORMAL LOW (ref 3.5–5.3)
SODIUM: 139 mmol/L (ref 135–146)

## 2017-02-06 LAB — MAGNESIUM: MAGNESIUM: 1.8 mg/dL (ref 1.5–2.5)

## 2017-02-06 MED ORDER — METOLAZONE 2.5 MG PO TABS: ORAL_TABLET | ORAL | 3 refills | Status: DC

## 2017-02-06 MED ORDER — POTASSIUM CHLORIDE CRYS ER 20 MEQ PO TBCR: 20.0000 meq | EXTENDED_RELEASE_TABLET | Freq: Two times a day (BID) | ORAL | 11 refills | Status: DC

## 2017-02-06 NOTE — Progress Notes (Signed)
Cardiology Office Note    Date:  02/06/2017   ID:  [name], DOB 1941-09-27, MRN [id number]  PCP:  Fabio Neighbors  Cardiologist:   Thurmon Fair, MD   Chief Complaint  Patient presents with  . Follow-up    1 month;  [name] Shortness of Breath    occasionally.  . Dizziness    occasionally.  . Leg Pain    ache in legs.    History of Present Illness:  [name] is a 76 y.o. male with a long-standing history of CAD with redo CABG 2004, ischemic cardiomyopathy, CRT-D, systolic heart failure, bilateral carotid stenoses and endarterectomy, returning for follow-up After diuretics were increased in December for shortness of breath and edema.  His breathing has improved but is not back to baseline yet. He sleeps with a large pillow and keeps his head elevated at night. He does not have leg edema. His weight has decreased 9 pounds since his last appointment, still about 4-5 pounds above what is his likely dry weight of 185-186 pounds. He denies angina pectoris, palpitations, syncope. He does have dizziness if he jumps up too quickly. Optivol showed marked improvement since his last visit. Also noted is the fact that the average heart rate at night it now shows a lower value than during the day. This physiological difference had disappeared in early January.  Continues to drink 4-5 beers a day. Works hard on the farm.  Device interrogation shows normal function. Battery voltage is 2.93 V (RRT = 2.63 V), all lead parameters are excellent, there is 99.9% biventricular pacing. There is also roughly 19% atrial pacing. There've been no episodes of treated ventricular arrhythmia or even nonsustained ventricular tachycardia. A few very brief episodes of atrial mode switch are recorded, there is no true atrial fibrillation. Activity level remains very high at roughly 4-6 hours per day, lower than the summer months when it is about 8 hours per day.   Past Medical History:  Diagnosis Date    . Anginal pain (HCC)   . Anxiety   . Aortic stenosis    a. s/p bioprosthetic AVR in 2010  . Arthritis    gout  . CAD (coronary artery disease)    a. s/p CABG in 1992 b. redo CABG in 2004 c. cath in 2010 showing patent LIMA-LAD, free RIMA-OM, and SVG-OM, and 80% stenosis of small PLA branch  . Chest pain   . CHF (congestive heart failure) (HCC)   . Dysrhythmia   . GERD (gastroesophageal reflux disease)    with gas and bloating probably cause of chest pain  . H/O atrial flutter    a. s/p ablation and not on anticoagulation secondary to GIB and alcohol abuse.   . H/O: gout   . Hyperlipidemia   . Hypertension   . 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 Ischemic cardiomyopathy    a. EF previously 20-25%, improved to 40-45% by echo in 03/2013)  . Myocardial infarction   . Pacemaker   . 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 Shortness of breath     Past Surgical History:  Procedure Laterality Date  . BI-VENTRICULAR IMPLANTABLE CARDIOVERTER DEFIBRILLATOR Right 08/18/2012   Procedure: BI-VENTRICULAR IMPLANTABLE CARDIOVERTER DEFIBRILLATOR  (CRT-D);  Surgeon: Marinus Maw, MD;  Location: [facility] CATH LAB;  Service: Cardiovascular;  Laterality: Right;  . CARDIAC CATHETERIZATION  Dec 18, 2003   with a presentation  of atrial flutter with rapid ventricular response. He had a cath and two stents to his PDA after his SVG to his RCA. His other graft were patent. He had LV with EF of 30%.   . CAROTID ENDARTERECTOMY     left in December 2003 and know renal artery stenosis of 60-70%  . CORONARY ARTERY BYPASS GRAFT  1993   redo in 2000, he has had multiple MI previous to both procedures.  . IMPLANTABLE CARDIOVERTER DEFIBRILLATOR (ICD) GENERATOR CHANGE N/A 06/25/2012   Procedure: ICD GENERATOR CHANGE;  Surgeon: Marinus MawGregg W Taylor, MD;  [facility];  Service: Cardiovascular;  Laterality: N/A;  . VALVE REPLACEMENT   2010    Current Medications: Outpatient Medications Prior to Visit  Medication Sig Dispense Refill  . amiodarone (PACERONE) 200 MG tablet Take 1 tablet (200 mg total) by mouth daily. 30 tablet 11  . aspirin EC 81 MG tablet Take by mouth daily.     . carvedilol (COREG) 3.125 MG tablet Take 1 tablet (3.125 mg total) by mouth 2 (two) times daily. 30 tablet 3  . furosemide (LASIX) 40 MG tablet Take 2 tablets (80 mg total) by mouth 2 (two) times daily. TAKE 80 MG IN THE MORNING AND 40MG  IN THE PM 90 tablet 3  . vitamin B-12 (CYANOCOBALAMIN) 1000 MCG tablet Take 1 tablet (1,000 mcg total) by mouth daily.    . metolazone (ZAROXOLYN) 2.5 MG tablet Take 1 tablet (2.5 mg total) by mouth once a week. 30 MINUTES BEFORE LASIX 4 tablet 3  . potassium chloride SA (KLOR-CON M20) 20 MEQ tablet Take 1 tablet (20 mEq total) by mouth 2 (two) times daily. 60 tablet 11   No facility-administered medications prior to visit.      Allergies:   Atorvastatin   Social History   Social History  . Marital status: Married    Spouse name: N/A  . Number of children: N/A  . Years of education: N/A   Social History Main Topics  . Smoking status: Never Smoker  . Smokeless tobacco: Current User    Types: Chew  . Alcohol use 8.4 oz/week    14 Cans of beer per week  . Drug use: No  . Sexual activity: Not Asked   Other Topics Concern  . None   Social History Narrative  . None       ROS:   Please see the history of present illness.    ROS All other systems reviewed and are negative.   PHYSICAL EXAM:   VS:  BP 119/69   Pulse 84   Ht 6\' 1"  (1.854 m)   Wt 86.3 kg (190 lb 3.2 oz)   BMI 25.09 kg/m    GEN: Well nourished, well developed, in no acute distress  HEENT: normal  Neck: JVP 6 cm, carotid bruits, or masses Cardiac: Paradoxically split second heart sound, RRR; no murmurs, rubs, or gallops,no edema , healthy left subclavian device site Respiratory:  clear to auscultation bilaterally, normal work  of breathing GI: soft, nontender, nondistended, + BS MS: no deformity or atrophy  Skin: warm and dry, no rash Neuro:  Alert and Oriented x 3, Strength and sensation are intact Psych: euthymic mood, full affect  Wt Readings from Last 3 Encounters:  02/06/17 86.3 kg (190 lb 3.2 oz)  01/15/17 90.2 kg (198 lb 12.8 oz)  12/10/16 89.9 kg (198 lb 3.2 oz)      Studies/Labs Reviewed:   EKG:  EKG is not ordered today.  The intracardiac EGM shows atrial sensed, ventricular paced rhythm  Recent Labs: 12/10/2016: Brain Natriuretic Peptide 1,439.2 12/19/2016: ALT 13; Platelets 251; TSH 9.39 01/12/2017: BUN 14; Creat 1.32; Hemoglobin 8.4; Potassium 3.9; Sodium 137   Lipid Panel    Component Value Date/Time   CHOL 158 12/19/2016 0841   TRIG 74 12/19/2016 0841   HDL 38 (L) 12/19/2016 0841   CHOLHDL 4.2 12/19/2016 0841   VLDL 15 12/19/2016 0841   LDLCALC 105 (H) 12/19/2016 0841    ASSESSMENT:    1. COMBINED HEART FAILURE, CHRONIC   2. Coronary artery disease involving autologous vein coronary bypass graft without angina pectoris   3. Cardiac resynchronization therapy defibrillator (CRT-D) in place   4. Pure hypercholesterolemia   5. Essential hypertension   6. Bilateral carotid artery stenosis   7. PAF (paroxysmal atrial fibrillation) (HCC)   8. Encounter for monitoring amiodarone therapy   9. H/O aortic valve replacement with porcine valve      PLAN:  In order of problems listed above:  1. CHF: Improved, NYHA functional class II.Reviewed the importance of sodium restriction, daily weight monitoring and the signs and symptoms of worsening heart failure. He is not back to baseline and still has jugular venous distention. Increase the thiazide diuretic to 3 days weekly and check labs before his next office appointment. We need to restart his ARB/ACE inhibitor would like to see what his kidney function is before we do that. He would actually be an excellent candidate for  Entresto. 2. CAD s/p redo CABG: Does not have angina pectoris despite a very active lifestyle. He has stopped his statin, but his lipid profile is not terrible. I think we should focus on optimizing heart failure management first. 3. CRT-D: Normal device function. Encouraged compliance with remote and in office follow-up. 4. HLP: restart atorvastatin 20 mg daily. Target LDL 70. 5. HTN: His blood pressure in ideal range.. 6. Carotid disease s/p bilateral carotid endarterectomies. No recent neurological complaints. Encouraged him to follow-up carotid US. 7. AFib: None in a long time as reported by his defibrillator. I do not think he is a good candidate for anticoagulation due to poor compliance with follow-up and medications and heavy alcohol use. 8. Amiodarone: normal liver function tests. Mildly elevated TSH without signs or symptoms of hypothyroidism, normal free T4..  9. S/P AVR: Normal function of his bioprosthesis by most recent echo and no significant murmur on physical exam. No symptoms of aortic stenosis or insufficiency. 10. 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 Asymptomatic.    Medication Adjustments/Labs and Tests Ordered: Current medicines are reviewed at length with the patient today.  Concerns regarding medicines are outlined above.  Medication changes, Labs and Tests ordered today are listed in the Patient Instructions below. Patient Instructions  Medication Instructions: Dr Royann Shivers has recommended making the following medication changes: 1. INCREASE Metolazone to 1 tablet THREE TIMES A WEEK - Mondays, Wednesdays, and Fridays 2. TAKE AN EXTRA Potassium tablet on Mondays, Wednesdays, and Fridays  Labwork: Your physician recommends that you return for lab work in 2 weeks. Please have this done 2-3 days prior to your follow-up appointment a PA.  Testing/Procedures: NONE  ORDERED  Follow-up: Your physician recommends that you schedule a follow-up appointment in 2-3 weeks with Randall An, PA.  Dr Royann Shivers recommends that you schedule a follow-up appointment in 3 months.  If you need a refill on your cardiac medications before your next appointment, please  call your pharmacy.    Signed, Thurmon Fair, MD  02/06/2017 1:20 PM    [facility] [address] [place], [place], [place]  [address] Phone: [phone]; Fax: [phone]

## 2017-02-06 NOTE — Patient Instructions (Signed)
Medication Instructions: Dr Royann Shiversroitoru has recommended making the following medication changes: 1. INCREASE Metolazone to 1 tablet THREE TIMES A WEEK - Mondays, Wednesdays, and Fridays 2. TAKE AN EXTRA Potassium tablet on Mondays, Wednesdays, and Fridays  Labwork: Your physician recommends that you return for lab work in 2 weeks. Please have this done 2-3 days prior to your follow-up appointment a PA.  Testing/Procedures: NONE ORDERED  Follow-up: Your physician recommends that you schedule a follow-up appointment in 2-3 weeks with Randall AnBrittany Strader, PA.  Dr Royann Shiversroitoru recommends that you schedule a follow-up appointment in 3 months.  If you need a refill on your cardiac medications before your next appointment, please call your pharmacy.

## 2017-02-13 ENCOUNTER — Telehealth: Payer: Self-pay

## 2017-02-13 ENCOUNTER — Other Ambulatory Visit: Payer: Self-pay | Admitting: Cardiovascular Disease

## 2017-02-13 DIAGNOSIS — I739 Peripheral vascular disease, unspecified: Secondary | ICD-10-CM

## 2017-02-13 DIAGNOSIS — E876 Hypokalemia: Secondary | ICD-10-CM

## 2017-02-13 MED ORDER — ATORVASTATIN CALCIUM 20 MG PO TABS: 20.0000 mg | ORAL_TABLET | Freq: Every day | ORAL | 0 refills | Status: DC

## 2017-02-13 NOTE — Telephone Encounter (Signed)
-----   Message from Thurmon FairMihai Croitoru, MD sent at 02/07/2017  9:39 AM EST ----- K is low. Please change total dose of KCl to 60 mEq (= 3 x 20 mEq) EVERY day (not just on MWF). Recheck in 2 weeks.

## 2017-02-13 NOTE — Telephone Encounter (Signed)
Called patient with results. Patient verbalized understanding. Patient has an appointment with Gentry RochBrittney Strader, PA on 3/2. [facility]   At last office visit pt had questions regarding PAD. Reported bilateral thigh and calf pain, tingling in legs all the time and legs/feet are cold. Discussed with MCr. Ordered ABIs and strongly encouraged pt to restart atorvastatin 20 mg. Recommendations communicated with pt. Pt agreed with plan.  ABIs ordered. Repeat BMET ordered. Atorvastatin sent to patient's pharmacy electronically.  Message routed to vascular scheduler to schedule ABIs.

## 2017-02-18 ENCOUNTER — Ambulatory Visit (HOSPITAL_COMMUNITY)
Admission: RE | Admit: 2017-02-18 | Discharge: 2017-02-18 | Disposition: A | Discharge status: Home / Self Care | Payer: Medicare Other | Source: Ambulatory Visit | Attending: Cardiovascular Disease | Admitting: Cardiovascular Disease

## 2017-02-18 DIAGNOSIS — I739 Peripheral vascular disease, unspecified: Secondary | ICD-10-CM | POA: Insufficient documentation

## 2017-02-19 LAB — BASIC METABOLIC PANEL
BUN: 34 mg/dL — AB (ref 7–25)
CO2: 32 mmol/L — ABNORMAL HIGH (ref 20–31)
Calcium: 9.5 mg/dL (ref 8.6–10.3)
Chloride: 91 mmol/L — ABNORMAL LOW (ref 98–110)
Creat: 1.78 mg/dL — ABNORMAL HIGH (ref 0.70–1.18)
Glucose, Bld: 93 mg/dL (ref 65–99)
Potassium: 3.1 mmol/L — ABNORMAL LOW (ref 3.5–5.3)
SODIUM: 136 mmol/L (ref 135–146)

## 2017-02-19 NOTE — Progress Notes (Addendum)
Cardiology Office Note    Date:  02/20/2017   ID:  [name], DOB October 06, 1941, MRN [id number]  PCP:  Ria Comment  Cardiologist: Dr. Sallyanne Kuster  Chief Complaint  Patient presents with  . Follow-up    History of Present Illness:    [name] is a 76 y.o. 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   Last seen by Dr. Sallyanne Kuster on 02/06/2017 and reported improvement in his respiratory status but not at baseline. Weight had declined 8 lbs from his prior office visit (198 lbs --> 190 lbs) but still not at baseline of 186 lbs. Optivol showed improvement in his volume status as well. Was still consuming 4-5 beers daily. Metolazone was increased from once weekly to 3x weekly. Creatinine was at 1.48 during office visit but increased to 1.78 on 02/18/2017, therefore was instructed to reduce Metolazone to once weekly.  In talking with the patient today, he reports significant improvement in his dyspnea, orthopnea and lower extremity edema since last office visit. Weight is down to 180 pounds. Rechecked for accuracy and at 179.8. Reports similar weights on his home scales (only weighs 2 days per week when visiting his daughter). Initially told me he was taking Zaroxolyn every single day for the past 2 weeks but later retracted the statement saying he was only taking it 3 times a week as prescribed.  Reports feeling significantly better at his current weight, and has increased energy.  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 Denies any chest discomfort or dyspnea with exertion with these activities.   Past Medical History:  Diagnosis Date  . Anginal pain ([place])   . Anxiety   . Aortic stenosis    a. s/p bioprosthetic AVR in 2010  . Arthritis    gout  . CAD (coronary artery disease)    a. s/p CABG in 1992 b. redo CABG in 2004 c. cath in 2010 showing patent LIMA-LAD, free RIMA-OM, and SVG-OM, and 80% stenosis of small PLA branch  . Chest pain   . CHF (congestive heart failure) ([place])   . Dysrhythmia   . GERD (gastroesophageal reflux disease)    with gas and bloating probably cause of chest pain  . H/O atrial flutter    a. s/p ablation and not on anticoagulation secondary to GIB and alcohol abuse.   . H/O: gout   . Hyperlipidemia   . Hypertension   . 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 Ischemic cardiomyopathy    a. EF previously 20-25%, improved to 40-45% by echo in 03/2013)  . Myocardial infarction   . Pacemaker   . 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 Shortness of breath     Past Surgical History:  Procedure Laterality Date  . BI-VENTRICULAR IMPLANTABLE CARDIOVERTER DEFIBRILLATOR Right 08/18/2012   Procedure: BI-VENTRICULAR IMPLANTABLE CARDIOVERTER DEFIBRILLATOR  (CRT-D);  Surgeon: Evans Lance, MD;  Location[facility] CATH LAB;  Service: Cardiovascular;  Laterality: Right;  . CARDIAC CATHETERIZATION  Dec 18, 2003   with a presentation of atrial flutter with rapid ventricular response. He had a cath and two stents to his PDA after his SVG to his RCA. His other graft were patent. He had LV with EF of 30%.   . CAROTID ENDARTERECTOMY     left in December 2003 and know renal artery stenosis of 60-70%  . CORONARY ARTERY BYPASS GRAFT  1993   redo in 2000, he has had multiple MI previous to both procedures.  . IMPLANTABLE CARDIOVERTER DEFIBRILLATOR (ICD) GENERATOR  CHANGE N/A 06/25/2012   Procedure: ICD GENERATOR CHANGE;  Surgeon: Evans Lance, MD;  Location[facility] CATH LAB;  Service: Cardiovascular;  Laterality: N/A;  . VALVE REPLACEMENT  2010    Current Medications: Outpatient Medications Prior to Visit  Medication Sig Dispense Refill  . amiodarone (PACERONE) 200 MG tablet Take 1 tablet (200 mg total) by mouth daily. 30 tablet 11  . aspirin EC 81 MG tablet Take by mouth daily.     [name] atorvastatin (LIPITOR) 20 MG tablet Take 1 tablet (20 mg total) by mouth daily. 30 tablet 0  . carvedilol (COREG) 3.125 MG tablet Take 1 tablet (3.125 mg total) by mouth 2 (two) times daily. 30 tablet 3  . vitamin B-12 (CYANOCOBALAMIN) 1000 MCG tablet Take 1 tablet (1,000 mcg total) by mouth daily.    . metolazone (ZAROXOLYN) 2.5 MG tablet Take 1 tablet (2.5 mg total) by mouth 3 days a week (Mondays, Wednesdays, and Fridays) [place]. 12 tablet 3  . potassium chloride SA (KLOR-CON M20) 20 MEQ tablet Take 1 tablet (20 mEq total) by mouth 2 (two) times daily. Take an extra tablet on Mondays, Wednesday, and Fridays. 75 tablet 11  . furosemide (LASIX) 40 MG tablet Take 2 tablets (80 mg total) by mouth 2 (two) times daily. TAKE 80 MG IN THE MORNING AND 40MG IN THE PM (Patient taking differently: Take 80 mg by mouth 2 (two) times daily. ) 90 tablet 3   No facility-administered medications prior to visit.      Allergies:   Atorvastatin   Social History   Social History  . Marital status: Married    Spouse name: N/A  . Number of children: N/A  . Years of education: N/A   Social History Main Topics  . Smoking status: Never Smoker  . Smokeless tobacco: Current User    Types: Chew  . Alcohol use 8.4 oz/week    14 Cans of beer per week  . Drug use: No  . Sexual activity: Not Asked   Other Topics Concern  . None   Social History Narrative  . None     Family History:  The patient's family history includes CAD in his brother.   Review of Systems:     Please see the history of present illness.     General:  No chills, fever, night sweats or weight changes.  Cardiovascular:  No chest pain, edema, orthopnea, palpitations, paroxysmal nocturnal dyspnea. Positive for dyspnea with exertion (improved).  Dermatological: No rash, lesions/masses Respiratory: No cough, dyspnea Urologic: No hematuria, dysuria Abdominal:   No nausea, vomiting, diarrhea, bright red blood per rectum, melena, or hematemesis Neurologic:  No visual changes, wkns, changes in mental status. All other systems reviewed and are otherwise negative except as noted above.   Physical Exam:    VS:  BP 122/72   Pulse 62   Ht 6' (1.829  m)   Wt 180 lb 3.2 oz (81.7 kg)   BMI 24.44 kg/m    General: Well developed, well nourished Caucasian male appearing in no acute distress. Head: Normocephalic, atraumatic, sclera non-icteric, no xanthomas, nares are without discharge.  Neck: No carotid bruits. JVD not elevated.  Lungs: Respirations regular and unlabored, without wheezes or rales.  Heart: Regular rate and rhythm. No S3 or S4.  No rubs, or gallops appreciated. 2/6 SEM along RUSB.  Abdomen: Soft, non-tender, non-distended with normoactive bowel sounds. No hepatomegaly. No rebound/guarding. No obvious abdominal masses. Msk:  Strength and tone appear normal for age. No joint deformities or effusions. Extremities: No clubbing or cyanosis. No lower extremity edema.  Distal pedal pulses are 2+ bilaterally. Neuro: Alert and oriented X 3. Moves all extremities spontaneously. No focal deficits noted. Psych:  Responds to questions appropriately with a normal affect. Skin: No rashes or lesions noted  Wt Readings from Last 3 Encounters:  02/20/17 180 lb 3.2 oz (81.7 kg)  02/06/17 190 lb 3.2 oz (86.3 kg)  01/15/17 198 lb 12.8 oz (90.2 kg)     Studies/Labs Reviewed:   EKG:  EKG is not ordered today.   Recent Labs: 12/10/2016: Brain Natriuretic Peptide 1,439.2 12/19/2016: ALT 13;  Platelets 251; TSH 9.39 01/12/2017: Hemoglobin 8.4 02/06/2017: Magnesium 1.8 02/18/2017: BUN 34; Creat 1.78; Potassium 3.1; Sodium 136   Lipid Panel    Component Value Date/Time   CHOL 158 12/19/2016 0841   TRIG 74 12/19/2016 0841   HDL 38 (L) 12/19/2016 0841   CHOLHDL 4.2 12/19/2016 0841   VLDL 15 12/19/2016 0841   LDLCALC 105 (H) 12/19/2016 0841    Additional studies/ records that were reviewed today include:   Echocardiogram: 03/2013 Study Conclusions  - Left ventricle: The cavity size was moderately dilated. There was moderate concentric hypertrophy. Systolic function was mildly to moderately reduced. The estimated ejection fraction was in the range of 40% to 45%. Moderate hypokinesis of the inferolateral myocardium. Mild hypokinesis of the inferior myocardium. Doppler parameters are consistent with abnormal left ventricular relaxation (grade 1 diastolic dysfunction). - Aortic valve: A bioprosthesis was present. Valve area: 1.48cm^2(VTI). Valve area: 1.35cm^2 (Vmax). - Mitral valve: Calcified annulus. Mild regurgitation. Valve area by pressure half-time: 1.31cm^2. Valve area by continuity equation (using LVOT flow): 2.68cm^2. - Left atrium: The atrium was mildly to moderately dilated. - Right atrium: The atrium was mildly dilated. - Atrial septum: No defect or patent foramen ovale was identified. Impressions:  - Bi-V ICD with Bi-V pacding. Segmental wall motion abnormalities with EF approximately =>40%. Stable peridardial AVR. No significant intra-ventricular dyssynchrony.  Assessment:    1. Medication management   2. COMBINED HEART FAILURE, CHRONIC   3. Coronary artery disease involving autologous vein coronary bypass graft without angina pectoris   4. Cardiac resynchronization therapy defibrillator (CRT-D) in place   5. PAF (paroxysmal atrial fibrillation) ([place])   6. Mixed hyperlipidemia      Plan:   In order of problems listed  above:  1. Chronic Combined Systolic and Diastolic CHF - EF 88-89% by echo in 2014 as above. Has been followed closely in the office for acute CHF symptoms. Baseline weight of 186 lbs but peaked at 198 lbs. Started on Metolazone once weekly which was increased to 3 times weekly 2 weeks prior. Unsure if he was compliant with this plan or had been taking it daily. Creatinine increased to 1.78 on 02/18/2017, therefore was instructed to reduce Metolazone back to once weekly. Weight at 180  lbs during today's visit and he does not appear volume overloaded. - will stop Metolazone as I am concerned he is actually dry on examination. Will continue Lasix 9m BID. He is going to purchase a home scale and weigh daily. If weight goes over 185 lbs, instructed to call the office as we may need to restart Metolazone once weekly. Recheck BMET in 2 weeks. Will hold off on starting ACE-I/ARB at this time with his AKI. Continue BB.   2. CAD - s/p CABG in 1992 and redo CABG in 2004, cath in 2010 showing patent LIMA-LAD, free RIMA-OM, and SVG-OM, and 80% stenosis of small PLA branch. - denies any recent anginal symptoms. - continue ASA, statin, and BB.  3. Ischemic Cardiomyopathy - EF previously 20-25%, improved to 40-45% by echo in 03/2013 - s/p Medtronic ICD placement 06/2012  4. PAF - s/p ablation.  - This patients CHA2DS2-VASc Score and unadjusted Ischemic Stroke Rate (% per year) is equal to 4.8 % stroke rate/year from a score of 4 (CHF, HTN, Vascular, Age). Not on anticoagulation secondary to history of GI bleed and alcohol use.  - continue BB therapy.   5. HLD - Lipid Panel in 11/2016 showed total cholesterol 158, HDL 38, and LDL 105. - recently restarted on Atorvastatin 248mdaily.    Medication Adjustments/Labs and Tests Ordered: Current medicines are reviewed at length with the patient today.  Concerns regarding medicines are outlined above.  Medication changes, Labs and Tests ordered today are listed  in the Patient Instructions below. Patient Instructions  Medication Instructions:  1.) STOP the metolazone.  2.) take 80 mg of the furosemide twice a day.  3.) take 20 Meq of the potassium twice a day.  Labwork: B-MET in 2 weeks  Testing/Procedures: NONE  Follow-Up: Dr. CrSallyanne Kustern 4 weeks.  Any Other Special Instructions Will Be Listed Below (If Applicable). Call if your weight is greater than 185 lbs.    Signed, BrErma HeritagePAUtah3/01/2017 6:28 PM    [facility] [address]  [id number]: [phone]: [phone] [place] [address]one: [phone]

## 2017-02-20 ENCOUNTER — Ambulatory Visit (INDEPENDENT_AMBULATORY_CARE_PROVIDER_SITE_OTHER): Payer: Medicare Other | Admitting: Student

## 2017-02-20 ENCOUNTER — Encounter: Payer: Self-pay | Admitting: Student

## 2017-02-20 VITALS — BP 122/72 | HR 62 | Ht 72.0 in | Wt 180.2 lb

## 2017-02-20 DIAGNOSIS — Z79899 Other long term (current) drug therapy: Secondary | ICD-10-CM | POA: Diagnosis not present

## 2017-02-20 DIAGNOSIS — Z95810 Presence of automatic (implantable) cardiac defibrillator: Secondary | ICD-10-CM

## 2017-02-20 DIAGNOSIS — E782 Mixed hyperlipidemia: Secondary | ICD-10-CM | POA: Diagnosis not present

## 2017-02-20 DIAGNOSIS — I25810 Atherosclerosis of coronary artery bypass graft(s) without angina pectoris: Secondary | ICD-10-CM

## 2017-02-20 DIAGNOSIS — I5042 Chronic combined systolic (congestive) and diastolic (congestive) heart failure: Secondary | ICD-10-CM

## 2017-02-20 DIAGNOSIS — I480 Paroxysmal atrial fibrillation: Secondary | ICD-10-CM | POA: Diagnosis not present

## 2017-02-20 MED ORDER — POTASSIUM CHLORIDE CRYS ER 20 MEQ PO TBCR
20.0000 meq | EXTENDED_RELEASE_TABLET | Freq: Two times a day (BID) | ORAL | 11 refills | Status: DC
Start: 2017-02-20 — End: 2018-01-25

## 2017-02-20 NOTE — Patient Instructions (Addendum)
Medication Instructions:   1.) STOP the metolazone.  2.) take 80 mg of the furosemide twice a day.  3.) take 20 Meq of the potassium twice a day.  Labwork:  B-MET in 2 weeks  Testing/Procedures:  NONE  Follow-Up: Dr. Sallyanne Kuster in 4 weeks.  Any Other Special Instructions Will Be Listed Below (If Applicable).  Call if your weight is greater than 185 lbs.

## 2017-02-26 LAB — CUP PACEART INCLINIC DEVICE CHECK
Brady Statistic AP VP Percent: 19 %
Brady Statistic AS VP Percent: 80.85 %
Brady Statistic RA Percent Paced: 19.01 %
Date Time Interrogation Session: 20180216145305
HIGH POWER IMPEDANCE MEASURED VALUE: 304 Ohm
HighPow Impedance: 63 Ohm
Implantable Lead Implant Date: 20130828
Implantable Lead Location: 753858
Implantable Lead Location: 753860
Implantable Lead Model: 4194
Implantable Lead Model: 5076
Lead Channel Impedance Value: 323 Ohm
Lead Channel Impedance Value: 703 Ohm
Lead Channel Pacing Threshold Amplitude: 0.625 V
Lead Channel Pacing Threshold Amplitude: 0.875 V
Lead Channel Pacing Threshold Amplitude: 1.125 V
Lead Channel Pacing Threshold Pulse Width: 0.4 ms
Lead Channel Pacing Threshold Pulse Width: 0.4 ms
Lead Channel Sensing Intrinsic Amplitude: 0.875 mV
Lead Channel Sensing Intrinsic Amplitude: 0.875 mV
Lead Channel Sensing Intrinsic Amplitude: 3.375 mV
Lead Channel Setting Pacing Amplitude: 2 V
Lead Channel Setting Pacing Pulse Width: 0.4 ms
Lead Channel Setting Sensing Sensitivity: 0.3 mV
MDC IDC LEAD IMPLANT DT: 20130828
MDC IDC LEAD IMPLANT DT: 20130828
MDC IDC LEAD LOCATION: 753859
MDC IDC MSMT BATTERY VOLTAGE: 2.93 V
MDC IDC MSMT LEADCHNL LV IMPEDANCE VALUE: 266 Ohm
MDC IDC MSMT LEADCHNL LV IMPEDANCE VALUE: 532 Ohm
MDC IDC MSMT LEADCHNL RA IMPEDANCE VALUE: 456 Ohm
MDC IDC MSMT LEADCHNL RA PACING THRESHOLD PULSEWIDTH: 0.4 ms
MDC IDC MSMT LEADCHNL RV SENSING INTR AMPL: 3.375 mV
MDC IDC PG IMPLANT DT: 20130828
MDC IDC SET LEADCHNL LV PACING AMPLITUDE: 2.25 V
MDC IDC SET LEADCHNL LV PACING PULSEWIDTH: 0.4 ms
MDC IDC SET LEADCHNL RA PACING AMPLITUDE: 1.75 V
MDC IDC STAT BRADY AP VS PERCENT: 0.03 %
MDC IDC STAT BRADY AS VS PERCENT: 0.12 %
MDC IDC STAT BRADY RV PERCENT PACED: 99.77 %

## 2017-02-28 NOTE — Progress Notes (Signed)
Thanks. He seems to be listening to advice a little better. MCr

## 2017-03-05 LAB — BASIC METABOLIC PANEL
BUN: 23 mg/dL (ref 7–25)
CHLORIDE: 100 mmol/L (ref 98–110)
CO2: 29 mmol/L (ref 20–31)
Calcium: 8.8 mg/dL (ref 8.6–10.3)
Creat: 1.23 mg/dL — ABNORMAL HIGH (ref 0.70–1.18)
Glucose, Bld: 93 mg/dL (ref 65–99)
POTASSIUM: 3.5 mmol/L (ref 3.5–5.3)
Sodium: 138 mmol/L (ref 135–146)

## 2017-03-06 ENCOUNTER — Telehealth: Payer: Self-pay | Admitting: *Deleted

## 2017-03-06 NOTE — Telephone Encounter (Signed)
-----   Message from Ellsworth LennoxBrittany M Strader, [place]PA sent at 03/05/2017  1:57 PM EDT ----- Please let the patient know his K+ and kidney function look great! Significantly improved from previous. Continue current medication regimen and please make sure he is weighing daily (to call if weight > 185 lbs). Thank you!

## 2017-03-06 NOTE — Telephone Encounter (Signed)
Left msg to call.

## 2017-03-13 ENCOUNTER — Other Ambulatory Visit: Payer: Self-pay | Admitting: Cardiovascular Disease

## 2017-03-16 NOTE — Telephone Encounter (Signed)
Rx(s) sent to pharmacy electronically.  

## 2017-03-18 ENCOUNTER — Encounter: Payer: Self-pay | Admitting: *Deleted

## 2017-03-20 NOTE — Telephone Encounter (Signed)
Letter w results mailed.

## 2017-03-22 NOTE — Progress Notes (Signed)
Cardiology Office Note    Date:  03/24/2017   ID:  TAHJE BORAWSKI, DOB [birth year], MRN [id number]  PCP:  Fabio Neighbors  Cardiologist: Dr. Royann Shivers   Chief Complaint  Patient presents with  . Leg Swelling    History of Present Illness:    [name] is a 76 y.o. male with past medical history of CAD (s/p CABG in 1992 and redo CABG in 2004, cath in 2010 showing patent LIMA-LAD, free RIMA-OM, and SVG-OM, and 80% stenosis of small PLA branch), chronic combined systolic and diastolicCHF, ischemic cardiomyopathy (EF previously 20-25%, improved to 40-45% by echo in 03/2013), SA node dysfunction (s/p [employer] ICD placement 06/2012), severe AS (s/p bioprosthetic AVR in 2010), PAD (s/p bilateral CEA), PAF (s/p ablation and not on anticoagulation 2ry to GI bleed and alcohol use) who presents to the office today for worsening lower extremity edema.   Was last seen by myself on 02/20/2017 for follow-up of his CHF. Weight was down to 180 lbs at that time but it was unclear if he had been taking Zaroxlyn 3x weekly as prescribed or taking it daily. He actually appeared dry on examination and was therefore instructed to stop Metolazone and continue with PO Lasix  BID. Kidney function was checked at that time and actually improved to 1.23 when compared to prior results.   In talking with the patient today, he reports having worsening lower extremity edema for the past week. He brings with him his medication bottles today and the instructions for the current bottle says "take  in AM and  in PM" instead of taking  BID as prescribed at his last office visit. He took  BID one evening and noted improvement in his symptoms. He denies any recent orthopnea or PND. Weight is up 5 lbs since his last office visit (180 lbs --> 185 lbs). Reports a similar change on his home scales.   He denies any recent chest pain or dyspnea on exertion. Is currently planting seeds in over 4 greenhouses  and denies any symptoms with these activities.   Still consuming 4-5 beers daily and occasional mixed drinks. Patient has stated multiple times he has no intention of quitting. Is willing to limit use but has been consuming this amount for at least the past 3-4 months.    Past Medical History:  Diagnosis Date  . Anginal pain (HCC)   . Anxiety   . Aortic stenosis    a. s/p bioprosthetic AVR in 2010  . Arthritis    gout  . CAD (coronary artery disease)    a. s/p CABG in 1992 b. redo CABG in 2004 c. cath in 2010 showing patent LIMA-LAD, free RIMA-OM, and SVG-OM, and 80% stenosis of small PLA branch  . Chest pain   . CHF (congestive heart failure) (HCC)   . Dysrhythmia   . GERD (gastroesophageal reflux disease)    with gas and bloating probably cause of chest pain  . H/O atrial flutter    a. s/p ablation and not on anticoagulation secondary to GIB and alcohol abuse.   . H/O: gout   . Hyperlipidemia   . Hypertension   . 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 Ischemic cardiomyopathy    a. EF previously 20-25%, improved to 40-45% by echo in 03/2013)  . Myocardial infarction   . Pacemaker   . 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 Shortness  of breath     Past Surgical History:  Procedure Laterality Date  . BI-VENTRICULAR IMPLANTABLE CARDIOVERTER DEFIBRILLATOR Right 08/18/2012   Procedure: BI-VENTRICULAR IMPLANTABLE CARDIOVERTER DEFIBRILLATOR  (CRT-D);  Surgeon: Marinus Maw, MD;  Location: [facility] CATH LAB;  Service: Cardiovascular;  Laterality: Right;  . CARDIAC CATHETERIZATION  Dec 18, 2003   with a presentation of atrial flutter with rapid ventricular response. He had a cath and two stents to his PDA after his SVG to his RCA. His other graft were patent. He had LV with EF of 30%.   . CAROTID ENDARTERECTOMY     left in December 2003 and know renal artery stenosis of 60-70%  . CORONARY ARTERY BYPASS  GRAFT  1993   redo in 2000, he has had multiple MI previous to both procedures.  . IMPLANTABLE CARDIOVERTER DEFIBRILLATOR (ICD) GENERATOR CHANGE N/A 06/25/2012   Procedure: ICD GENERATOR CHANGE;  Surgeon: Marinus Maw, MD;  Location: [facility] CATH LAB;  Service: Cardiovascular;  Laterality: N/A;  . VALVE REPLACEMENT  2010    Current Medications: Outpatient Medications Prior to Visit  Medication Sig Dispense Refill  . amiodarone (PACERONE) 200 MG tablet Take 1 tablet (200 mg total) by mouth daily. 30 tablet 11  . aspirin EC 81 MG tablet Take by mouth daily.     [name] atorvastatin (LIPITOR) 20 MG tablet TAKE 1 TABLET (20 MG TOTAL) BY MOUTH DAILY. 30 tablet 6  . carvedilol (COREG) 3.125 MG tablet Take 1 tablet (3.125 mg total) by mouth 2 (two) times daily. 30 tablet 3  . potassium chloride SA (KLOR-CON M20) 20 MEQ tablet Take 1 tablet (20 mEq total) by mouth 2 (two) times daily. 75 tablet 11  . vitamin B-12 (CYANOCOBALAMIN) 1000 MCG tablet Take 1 tablet (1,000 mcg total) by mouth daily.    . furosemide (LASIX) 40 MG tablet Take 40 mg by mouth as directed. TAKE 80 MG TWICE A DAY     No facility-administered medications prior to visit.      Allergies:   Atorvastatin   Social History   Social History  . Marital status: Married    Spouse name: N/A  . Number of children: N/A  . Years of education: N/A   Social History Main Topics  . Smoking status: Never Smoker  . Smokeless tobacco: Current User    Types: Chew  . Alcohol use 8.4 oz/week    14 Cans of beer per week  . Drug use: No  . Sexual activity: Not Asked   Other Topics Concern  . None   Social History Narrative  . None     Family History:  The patient's family history includes CAD in his brother.   Review of Systems:   Please see the history of present illness.     General:  No chills, fever, night sweats or weight changes.  Cardiovascular:  No chest pain, dyspnea on exertion, orthopnea, palpitations, paroxysmal nocturnal  dyspnea. Positive for lower extremity edema.  Dermatological: No rash, lesions/masses Respiratory: No cough, dyspnea Urologic: No hematuria, dysuria Abdominal:   No nausea, vomiting, diarrhea, bright red blood per rectum, melena, or hematemesis Neurologic:  No visual changes, wkns, changes in mental status. All other systems reviewed and are otherwise negative except as noted above.   Physical Exam:    VS:  BP (!) 142/84   Pulse (!) 58   Ht 6' (1.829 m)   Wt 185 lb 12.8 oz (84.3 kg)   BMI 25.20 kg/m    General:  Well developed, well nourished Caucasian male appearing in no acute distress. Head: Normocephalic, atraumatic, sclera non-icteric, no xanthomas, nares are without discharge.  Neck: No carotid bruits. JVD not elevated.  Lungs: Respirations regular and unlabored, without wheezes or rales.  Heart: Regular rate and rhythm. No S3 or S4.  No murmur, no rubs, or gallops appreciated. Abdomen: Soft, non-tender, non-distended with normoactive bowel sounds. No hepatomegaly. No rebound/guarding. No obvious abdominal masses. Msk:  Strength and tone appear normal for age. No joint deformities or effusions. Extremities: No clubbing or cyanosis. 1+ pitting edema up to mid-shins bilaterally.  Distal pedal pulses are 2+ bilaterally. Neuro: Alert and oriented X 3. Moves all extremities spontaneously. No focal deficits noted. Psych:  Responds to questions appropriately with a normal affect. Skin: No rashes or lesions noted  Wt Readings from Last 3 Encounters:  03/24/17 185 lb 12.8 oz (84.3 kg)  02/20/17 180 lb 3.2 oz (81.7 kg)  02/06/17 190 lb 3.2 oz (86.3 kg)     Studies/Labs Reviewed:   EKG:  EKG is not ordered today.  Recent Labs: 12/10/2016: Brain Natriuretic Peptide 1,439.2 12/19/2016: ALT 13; Platelets 251; TSH 9.39 01/12/2017: Hemoglobin 8.4 02/06/2017: Magnesium 1.8 03/04/2017: BUN 23; Creat 1.23; Potassium 3.5; Sodium 138   Lipid Panel    Component Value Date/Time   CHOL 158  12/19/2016 0841   TRIG 74 12/19/2016 0841   HDL 38 (L) 12/19/2016 0841   CHOLHDL 4.2 12/19/2016 0841   VLDL 15 12/19/2016 0841   LDLCALC 105 (H) 12/19/2016 0841    Additional studies/ records that were reviewed today include:   Echocardiogram: 03/2013 Study Conclusions  - Left ventricle: The cavity size was moderately dilated. There was moderate concentric hypertrophy. Systolic function was mildly to moderately reduced. The estimated ejection fraction was in the range of 40% to 45%. Moderate hypokinesis of the inferolateral myocardium. Mild hypokinesis of the inferior myocardium. Doppler parameters are consistent with abnormal left ventricular relaxation (grade 1 diastolic dysfunction). - Aortic valve: A bioprosthesis was present. Valve area: 1.48cm^2(VTI). Valve area: 1.35cm^2 (Vmax). - Mitral valve: Calcified annulus. Mild regurgitation. Valve area by pressure half-time: 1.31cm^2. Valve area by continuity equation (using LVOT flow): 2.68cm^2. - Left atrium: The atrium was mildly to moderately dilated. - Right atrium: The atrium was mildly dilated. - Atrial septum: No defect or patent foramen ovale was identified. Impressions:  - Bi-V ICD with Bi-V pacding. Segmental wall motion abnormalities with EF approximately =>40%. Stable peridardial AVR. No significant intra-ventricular dyssynchrony.  Assessment:    1. Chronic combined systolic and diastolic heart failure (HCC)   2. Cardiomyopathy, ischemic   3. Coronary artery disease involving autologous vein coronary bypass graft without angina pectoris   4. H/O aortic valve replacement   5. PAF (paroxysmal atrial fibrillation) (HCC)      Plan:   In order of problems listed above:  1. Chronic Combined Systolic and Diastolic CHF - EF at 40-45% by echo in 2014, previously at 20-25%. Was informed at last office visit to take Lasix  BID. The label on his Rx bottle read as "take  in AM and   in PM" and he continued with this instead of taking  BID as instructed at his last office visit. Has experienced worsening lower extremity edema since with a 5 lb weight gain. At 185 lbs today.  - will have him resume Lasix at  BID as instructed at last office visit. I informed the patient that if weight does not decline with increased Lasix dosing,  then to call, as he made need the addition of once weekly Metolazone dosing (had been on PRN dosing in the past).  - continue Coreg. Patient is not open to being on additional medications such as an ACE-I/ARB/ARNI at this time.   2. Ischemic Cardiomyopathy - s/p ICD placement in 06/2012. - device check in 01/2017 showed normal device function.  - followed by Dr. Royann Shivers  3. CAD - s/p CABG in 1992 and redo CABG in 2004, cath in 2010 showing patent LIMA-LAD, free RIMA-OM, and SVG-OM, and 80% stenosis of small PLA branch.  - he denies any recent anginal symptoms. 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   - continue ASA, statin, and BB therapy.  4. Severe AS - s/p bioprosthetic AVR in 2010.  5. PAF  - s/p ablation and not on anticoagulation secondary to history of GI bleed and current alcohol use. Maintaining NSR by examination today. Still consuming 4-5 beers daily. Continue with ASA only at this time. - continue BB for rate-control.   Medication Adjustments/Labs and Tests Ordered: Current medicines are reviewed at length with the patient today.  Concerns regarding medicines are outlined above.  Medication changes, Labs and Tests ordered today are listed in the Patient Instructions below. Patient Instructions  Medication Instructions: Take Lasix 80 mg twice daily.  Follow-Up: Keep your follow-up appt with Dr. Royann Shivers as listed below.  If you need a refill on your cardiac medications before your next appointment, please call your pharmacy.  Signed, Ellsworth Lennox, PA  03/24/2017 5:35 PM      [facility] [address] [place], [address] [place], [place]  [address] Phone: [phone]; Fax: [phone]  [address], [address] [place], [place] [address] Phone: [phone]

## 2017-03-24 ENCOUNTER — Ambulatory Visit (INDEPENDENT_AMBULATORY_CARE_PROVIDER_SITE_OTHER): Payer: Medicare Other | Admitting: Student

## 2017-03-24 ENCOUNTER — Encounter: Payer: Self-pay | Admitting: Student

## 2017-03-24 ENCOUNTER — Encounter: Payer: Medicare Other | Admitting: Cardiovascular Disease

## 2017-03-24 VITALS — BP 142/84 | HR 58 | Ht 72.0 in | Wt 185.8 lb

## 2017-03-24 DIAGNOSIS — I480 Paroxysmal atrial fibrillation: Secondary | ICD-10-CM

## 2017-03-24 DIAGNOSIS — I25810 Atherosclerosis of coronary artery bypass graft(s) without angina pectoris: Secondary | ICD-10-CM

## 2017-03-24 DIAGNOSIS — I5042 Chronic combined systolic (congestive) and diastolic (congestive) heart failure: Secondary | ICD-10-CM

## 2017-03-24 DIAGNOSIS — Z952 Presence of prosthetic heart valve: Secondary | ICD-10-CM

## 2017-03-24 DIAGNOSIS — I255 Ischemic cardiomyopathy: Secondary | ICD-10-CM

## 2017-03-24 MED ORDER — FUROSEMIDE 40 MG PO TABS: 80.0000 mg | ORAL_TABLET | Freq: Two times a day (BID) | ORAL | 6 refills | Status: DC

## 2017-03-24 MED ORDER — FUROSEMIDE 40 MG PO TABS: 80.0000 mg | ORAL_TABLET | Freq: Two times a day (BID) | ORAL | 5 refills | Status: DC

## 2017-03-24 NOTE — Patient Instructions (Signed)
Medication Instructions: Take Lasix 80 mg twice daily.  Follow-Up: Keep your follow-up appt with Dr. Royann Shivers as listed below.   If you need a refill on your cardiac medications before your next appointment, please call your pharmacy.

## 2017-03-28 ENCOUNTER — Other Ambulatory Visit: Payer: Self-pay | Admitting: Student

## 2017-04-15 ENCOUNTER — Ambulatory Visit (INDEPENDENT_AMBULATORY_CARE_PROVIDER_SITE_OTHER): Payer: Medicare Other | Admitting: Cardiovascular Disease

## 2017-04-15 ENCOUNTER — Encounter: Payer: Self-pay | Admitting: Cardiovascular Disease

## 2017-04-15 VITALS — BP 110/58 | HR 61 | Ht 72.0 in | Wt 180.0 lb

## 2017-04-15 DIAGNOSIS — I255 Ischemic cardiomyopathy: Secondary | ICD-10-CM | POA: Diagnosis not present

## 2017-04-15 DIAGNOSIS — I10 Essential (primary) hypertension: Secondary | ICD-10-CM | POA: Diagnosis not present

## 2017-04-15 DIAGNOSIS — E782 Mixed hyperlipidemia: Secondary | ICD-10-CM

## 2017-04-15 DIAGNOSIS — I495 Sick sinus syndrome: Secondary | ICD-10-CM

## 2017-04-15 DIAGNOSIS — I6523 Occlusion and stenosis of bilateral carotid arteries: Secondary | ICD-10-CM | POA: Diagnosis not present

## 2017-04-15 DIAGNOSIS — M10442 Other secondary gout, left hand: Secondary | ICD-10-CM | POA: Diagnosis not present

## 2017-04-15 DIAGNOSIS — I480 Paroxysmal atrial fibrillation: Secondary | ICD-10-CM | POA: Diagnosis not present

## 2017-04-15 DIAGNOSIS — Z5181 Encounter for therapeutic drug level monitoring: Secondary | ICD-10-CM | POA: Diagnosis not present

## 2017-04-15 DIAGNOSIS — Z79899 Other long term (current) drug therapy: Secondary | ICD-10-CM

## 2017-04-15 DIAGNOSIS — I5042 Chronic combined systolic (congestive) and diastolic (congestive) heart failure: Secondary | ICD-10-CM

## 2017-04-15 DIAGNOSIS — Z95810 Presence of automatic (implantable) cardiac defibrillator: Secondary | ICD-10-CM

## 2017-04-15 DIAGNOSIS — Z952 Presence of prosthetic heart valve: Secondary | ICD-10-CM | POA: Diagnosis not present

## 2017-04-15 DIAGNOSIS — I25810 Atherosclerosis of coronary artery bypass graft(s) without angina pectoris: Secondary | ICD-10-CM

## 2017-04-15 LAB — CUP PACEART INCLINIC DEVICE CHECK
Battery Voltage: 2.86 V
Brady Statistic AP VP Percent: 40.77 %
Brady Statistic AS VS Percent: 0.16 %
Brady Statistic RV Percent Paced: 99.64 %
HighPow Impedance: 266 Ohm
HighPow Impedance: 74 Ohm
Implantable Lead Implant Date: 20130828
Implantable Lead Implant Date: 20130828
Implantable Lead Location: 753858
Implantable Lead Location: 753860
Implantable Lead Model: 4194
Implantable Lead Model: 5076
Lead Channel Impedance Value: 342 Ohm
Lead Channel Impedance Value: 456 Ohm
Lead Channel Impedance Value: 570 Ohm
Lead Channel Impedance Value: 722 Ohm
Lead Channel Pacing Threshold Amplitude: 0.875 V
Lead Channel Pacing Threshold Pulse Width: 0.4 ms
Lead Channel Sensing Intrinsic Amplitude: 0.75 mV
Lead Channel Setting Pacing Amplitude: 1.5 V
Lead Channel Setting Pacing Amplitude: 2.25 V
Lead Channel Setting Pacing Pulse Width: 0.4 ms
Lead Channel Setting Pacing Pulse Width: 0.4 ms
Lead Channel Setting Sensing Sensitivity: 0.3 mV
MDC IDC LEAD IMPLANT DT: 20130828
MDC IDC LEAD LOCATION: 753859
MDC IDC MSMT LEADCHNL LV IMPEDANCE VALUE: 304 Ohm
MDC IDC MSMT LEADCHNL LV PACING THRESHOLD AMPLITUDE: 1.125 V
MDC IDC MSMT LEADCHNL LV PACING THRESHOLD PULSEWIDTH: 0.4 ms
MDC IDC MSMT LEADCHNL RA PACING THRESHOLD AMPLITUDE: 0.75 V
MDC IDC MSMT LEADCHNL RA SENSING INTR AMPL: 0.875 mV
MDC IDC MSMT LEADCHNL RV PACING THRESHOLD PULSEWIDTH: 0.4 ms
MDC IDC MSMT LEADCHNL RV SENSING INTR AMPL: 4.125 mV
MDC IDC MSMT LEADCHNL RV SENSING INTR AMPL: 4.25 mV
MDC IDC PG IMPLANT DT: 20130828
MDC IDC SESS DTM: 20180425153136
MDC IDC SET LEADCHNL RV PACING AMPLITUDE: 2 V
MDC IDC STAT BRADY AP VS PERCENT: 0.06 %
MDC IDC STAT BRADY AS VP PERCENT: 59.01 %
MDC IDC STAT BRADY RA PERCENT PACED: 40.74 %

## 2017-04-15 MED ORDER — COLCHICINE 0.6 MG PO TABS: 0.6000 mg | ORAL_TABLET | ORAL | 3 refills | Status: DC | PRN

## 2017-04-15 MED ORDER — FUROSEMIDE 40 MG PO TABS: ORAL_TABLET | ORAL | 11 refills | Status: DC

## 2017-04-15 NOTE — Patient Instructions (Signed)
Dr Royann Shivers has recommended making the following medication changes: 1. START Colchicine 0.6 mg - take 1 tablet by mouth daily as needed for 5-7 days for gout attacks 2. DECREASE Furosemide to 2 tablets (80 mg total) by mouth every morning and 1 tablet (40 mg total) by mouth every afternoon  Remote monitoring is used to monitor your Pacemaker of ICD from home. This monitoring reduces the number of office visits required to check your device to one time per year. It allows [place] to keep an eye on the functioning of your device to ensure it is working properly. You are scheduled for a device check from home on Wednesday, July 25th, 2018. You may send your transmission at any time that day. If you have a wireless device, the transmission will be sent automatically. After your physician reviews your transmission, you will receive a postcard with your next transmission date.  Your physician recommends that you schedule a follow-up appointment in 3 months with Randall An, PA.  Dr Royann Shivers recommends that you schedule a follow-up appointment in 6 months with a defibrillator check. You will receive a reminder letter in the mail two months in advance. If you don't receive a letter, please call our office to schedule the follow-up appointment.  If you need a refill on your cardiac medications before your next appointment, please call your pharmacy.

## 2017-04-15 NOTE — Progress Notes (Signed)
Cardiology Office Note    Date:  04/16/2017   ID:  Donald, [name] [birth year], MRN [id number]  PCP:  Fabio Neighbors  Cardiologist:   Thurmon Fair, MD   No chief complaint on file.   History of Present Illness:  [name] is a 76 y.o. male with a long-standing history of CAD with redo CABG 2004, ischemic cardiomyopathy, CRT-D, systolic heart failure, bilateral carotid stenoses and endarterectomy, returning for follow-up.  He has achieved a good level of stability with his heart failure symptoms. His home scale typically shows 179-180 pounds. At times he feels a little dizzy and actually eats some salt. Usually his weight does not vary more than 2.5 or 3 pounds from this baseline, but occasionally has reached 190 pounds quickly, if he overindulges in salt. He is compliant with his loop diuretic prescription and has not required metolazone in the last 6 weeks. His defibrillator showed rather volatile thoracic impedance over the last several months but seems to have stabilized now. He is very active. 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 His defibrillator shows 9 hours a day of activity over the last week and this is typical for him. He has not had full-blown syncope and denies palpitations or exertional angina. He does not have orthopnea or PND.[name]  He stopped drinking beer but has 2 mixed drinks a day. He mixes whiskey with "mountain explosion" which I think is a caffeinated soda. From his description it sounds like each of his mixed drinks is equivalent to 2 or 3 shots of liquor.  He has acute gout in his left thumb.  He has a [employer] protecta CRT-D device implanted in 2013 Device interrogation shows normal function. Battery voltage shows estimated generator longevity of 10-13 months, all lead parameters are excellent, there is 99.7% biventricular pacing. There is also roughly 41% atrial pacing. There've been no episodes of treated ventricular arrhythmia or even nonsustained  ventricular tachycardia. A few very brief episodes of atrial mode switch are recorded, there is no true atrial fibrillation. Activity level remains very high at roughly 9 hours per day.   Past Medical History:  Diagnosis Date  . Anginal pain (HCC)   . Anxiety   . Aortic stenosis    a. s/p bioprosthetic AVR in 2010  . Arthritis    gout  . CAD (coronary artery disease)    a. s/p CABG in 1992 b. redo CABG in 2004 c. cath in 2010 showing patent LIMA-LAD, free RIMA-OM, and SVG-OM, and 80% stenosis of small PLA branch  . Chest pain   . CHF (congestive heart failure) (HCC)   . Dysrhythmia   . GERD (gastroesophageal reflux disease)    with gas and bloating probably cause of chest pain  . H/O atrial flutter    a. s/p ablation and not on anticoagulation secondary to GIB and alcohol abuse.   . H/O: gout   . Hyperlipidemia   . Hypertension   . 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 Ischemic cardiomyopathy    a. EF previously 20-25%, improved to 40-45% by echo in 03/2013)  . Myocardial infarction (HCC)   . Pacemaker   . 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 Shortness of breath     Past Surgical History:  Procedure Laterality Date  . BI-VENTRICULAR IMPLANTABLE CARDIOVERTER DEFIBRILLATOR Right 08/18/2012   Procedure: BI-VENTRICULAR IMPLANTABLE CARDIOVERTER DEFIBRILLATOR  (CRT-D);  Surgeon:  Marinus Maw, MD;  Location: [facility] C[facility] LAB;  Service: Cardiovascular;  Laterality: Right;  . CARDIAC CATHETERIZATION  Dec 18, 2003   with a presentation of atrial flutter with rapid ventricular response. He had a cath and two stents to his PDA after his SVG to his RCA. His other graft were patent. He had LV with EF of 30%.   . CAROTID ENDARTERECTOMY     left in December 2003 and know renal artery stenosis of 60-70%  . CORONARY ARTERY BYPASS GRAFT  1993   redo in 2000, he has had multiple MI previous to both procedures.  .  IMPLANTABLE CARDIOVERTER DEFIBRILLATOR (ICD) GENERATOR CHANGE N/A 06/25/2012   Procedure: ICD GENERATOR CHANGE;  Surgeon: Marinus Maw, MD;  Location: [facility] C[facility] LAB;  Service: Cardiovascular;  Laterality: N/A;  . VALVE REPLACEMENT  2010    Current Medications: Outpatient Medications Prior to Visit  Medication Sig Dispense Refill  . amiodarone (PACERONE) 200 MG tablet Take 1 tablet (200 mg total) by mouth daily. 30 tablet 11  . aspirin EC 81 MG tablet Take by mouth daily.     [name] atorvastatin (LIPITOR) 20 MG tablet TAKE 1 TABLET (20 MG TOTAL) BY MOUTH DAILY. 30 tablet 6  . carvedilol (COREG) 3.125 MG tablet TAKE 1 TABLET (3.125 MG TOTAL) BY MOUTH 2 (TWO) TIMES DAILY. 30 tablet 11  . nitroGLYCERIN (NITROSTAT) 0.4 MG SL tablet PUT1TAB UNDER TONGUE EVERY AS NEEDED FOR CHESTPAIN, UP TO 3DOSES. IF NOT RELIEVED, CALL EMS/MD 25 tablet 0  . potassium chloride SA (KLOR-CON M20) 20 MEQ tablet Take 1 tablet (20 mEq total) by mouth 2 (two) times daily. 75 tablet 11  . vitamin B-12 (CYANOCOBALAMIN) 1000 MCG tablet Take 1 tablet (1,000 mcg total) by mouth daily.    . furosemide (LASIX) 40 MG tablet Take 2 tablets (80 mg total) by mouth 2 (two) times daily. 120 tablet 5   No facility-administered medications prior to visit.      Allergies:   Atorvastatin   Social History   Social History  . Marital status: Married    Spouse name: N/A  . Number of children: N/A  . Years of education: N/A   Social History Main Topics  . Smoking status: Never Smoker  . Smokeless tobacco: Current User    Types: Chew  . Alcohol use 8.4 oz/week    14 Cans of beer per week  . Drug use: No  . Sexual activity: Not Asked   Other Topics Concern  . None   Social History Narrative  . None       ROS:   Please see the history of present illness.    ROS All other systems reviewed and are negative.   PHYSICAL EXAM:   VS:  BP (!) 110/58 (BP Location: Right Arm, Patient Position: Sitting, Cuff Size: Normal)    Pulse 61   Ht 6' (1.829 m)   Wt 81.6 kg (180 lb)   BMI 24.41 kg/m    GEN: Well nourished, well developed, in no acute distress  HEENT: normal  Neck: JVP 1-2 cm, carotid bruits, or masses Cardiac: Paradoxically split second heart sound, RRR; no murmurs, rubs, or gallops,no edema , healthy left subclavian device site Respiratory:  clear to auscultation bilaterally, normal work of breathing GI: soft, nontender, nondistended, + BS MS: no deformity or atrophy . Tender, erythematous gouty nodule in his right thumb Skin: warm and dry, no rash Neuro:  Alert and Oriented x 3, Strength and  sensation are intact.  Psych: euthymic mood, full affect  Wt Readings from Last 3 Encounters:  04/15/17 81.6 kg (180 lb)  03/24/17 84.3 kg (185 lb 12.8 oz)  02/20/17 81.7 kg (180 lb 3.2 oz)      Studies/Labs Reviewed:   EKG:  EKG is ordered today.  It shows atrial sensed, ventricular paced rhythm. Prominent R waves in lead V1 and V2 are consistent with effective left ventricular pacing, although the QRS is quite broad at 198 ms. QTC 557 ms  Recent Labs: 12/10/2016: Brain Natriuretic Peptide 1,439.2 12/19/2016: ALT 13; Platelets 251; TSH 9.39 01/12/2017: Hemoglobin 8.4 02/06/2017: Magnesium 1.8 03/04/2017: BUN 23; Creat 1.23; Potassium 3.5; Sodium 138   Lipid Panel    Component Value Date/Time   CHOL 158 12/19/2016 0841   TRIG 74 12/19/2016 0841   HDL 38 (L) 12/19/2016 0841   CHOLHDL 4.2 12/19/2016 0841   VLDL 15 12/19/2016 0841   LDLCALC 105 (H) 12/19/2016 0841    ASSESSMENT:    1. Chronic combined systolic and diastolic heart failure (HCC)   2. Coronary artery disease involving autologous vein coronary bypass graft without angina pectoris   3. 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 Mixed hyperlipidemia   5. Essential hypertension   6. Bilateral carotid artery stenosis   7. PAF (paroxysmal atrial fibrillation) (HCC)   8. Encounter for monitoring amiodarone  therapy   9. H/O aortic valve replacement   10. Other secondary acute gout of left hand      PLAN:  In order of problems listed above:  1. CHF: Improved, NYHA functional class II. He works hard on the farm, for 8 or 9 hours a day. It seems that his "dry weight" is just above 180 pounds. He has occasional dizziness and lightheadedness consistent with hypotension. Rather than eat extra salt, I recommended that he decrease the afternoon dose of furosemide by 50%. Reviewed the importance of sodium restriction, daily weight monitoring and the signs and symptoms of worsening heart failure. At this point, because of his symptoms of hypotension, I don't think we can add a RAAS inhibitor. Reviewed the fact that he is also getting quite a bit of diuretic effect from the alcohol and caffeine that he consumes. Encouraged him to decrease the intake of both. 2. CAD s/p redo CABG: Does not have angina pectoris despite a very active lifestyle. He has restarted his statin. He is drinking quite a bit. Monitor for toxicity especially since he is also taking amiodarone 3. CRT-D: Normal device function. Encouraged compliance with remote and in office follow-up. 4. HLP: Target LDL 70. Recheck in about 3 months 5. HTN: His blood pressure in ideal range, probably has hypotension at times when he works outside. 6. Carotid disease s/p bilateral carotid endarterectomies. No recent neurological complaints.  7. AFib: None in a long time as reported by his defibrillator. I do not think he is a good candidate for anticoagulation due to poor compliance with follow-up and medications and heavy alcohol use. 8. Amiodarone: normal liver function tests. Mildly elevated TSH without signs or symptoms of hypothyroidism, normal free T4 in December 2017. Check liver tests and TSH, free T4 at his next appointment in 3 months.  9. S/P AVR: Normal function of his bioprosthesis by most recent echo and no significant murmur on physical exam. No  symptoms of aortic stenosis or insufficiency. 10. Gout: Short course of colchicine prescribed 11. 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 Asymptomatic.    Medication Adjustments/Labs and Tests Ordered: Current medicines are reviewed at length with the patient today.  Concerns regarding medicines are outlined above.  Medication changes, Labs and Tests ordered today are listed in the Patient Instructions below. Patient Instructions  Dr Royann Shivers has recommended making the following medication changes: 1. START Colchicine 0.6 mg - take 1 tablet by mouth daily as needed for 5-7 days for gout attacks 2. DECREASE Furosemide to 2 tablets (80 mg total) by mouth every morning and 1 tablet (40 mg total) by mouth every afternoon  Remote monitoring is used to monitor your Pacemaker of ICD from home. This monitoring reduces the number of office visits required to check your device to one time per year. It allows [place] to keep an eye on the functioning of your device to ensure it is working properly. You are scheduled for a device check from home on Wednesday, July 25th, 2018. You may send your transmission at any time that day. If you have a wireless device, the transmission will be sent automatically. After your physician reviews your transmission, you will receive a postcard with your next transmission date.  Your physician recommends that you schedule a follow-up appointment in 3 months with Randall An, PA.  Dr Royann Shivers recommends that you schedule a follow-up appointment in 6 months with a defibrillator check. You will receive a reminder letter in the mail two months in advance. If you don't receive a letter, please call our office to schedule the follow-up appointment.  If you need a refill on your cardiac medications before your next appointment, please call your pharmacy.    Signed, Thurmon Fair, MD    04/16/2017 9:26 AM    [facility] [address] [place], [place], [place]  [address] Phone: [phone]; Fax: [phone]

## 2017-04-21 ENCOUNTER — Other Ambulatory Visit: Payer: Self-pay | Admitting: Student

## 2017-05-11 ENCOUNTER — Encounter: Payer: Medicare Other | Admitting: Cardiovascular Disease

## 2017-06-05 ENCOUNTER — Emergency Department (HOSPITAL_COMMUNITY): Payer: Medicare Other

## 2017-06-05 ENCOUNTER — Emergency Department (HOSPITAL_COMMUNITY)
Admission: EM | Admit: 2017-06-05 | Discharge: 2017-06-05 | Disposition: A | Discharge status: Home / Self Care | Payer: Medicare Other | Attending: Emergency Medicine | Admitting: Emergency Medicine

## 2017-06-05 ENCOUNTER — Encounter (HOSPITAL_COMMUNITY): Payer: Self-pay

## 2017-06-05 ENCOUNTER — Telehealth: Payer: Self-pay | Admitting: Cardiovascular Disease

## 2017-06-05 DIAGNOSIS — I255 Ischemic cardiomyopathy: Secondary | ICD-10-CM | POA: Insufficient documentation

## 2017-06-05 DIAGNOSIS — I712 Thoracic aortic aneurysm, without rupture, unspecified: Secondary | ICD-10-CM

## 2017-06-05 DIAGNOSIS — R42 Dizziness and giddiness: Secondary | ICD-10-CM | POA: Diagnosis not present

## 2017-06-05 DIAGNOSIS — E876 Hypokalemia: Secondary | ICD-10-CM | POA: Insufficient documentation

## 2017-06-05 DIAGNOSIS — K219 Gastro-esophageal reflux disease without esophagitis: Secondary | ICD-10-CM | POA: Diagnosis not present

## 2017-06-05 DIAGNOSIS — E785 Hyperlipidemia, unspecified: Secondary | ICD-10-CM | POA: Insufficient documentation

## 2017-06-05 DIAGNOSIS — Z79899 Other long term (current) drug therapy: Secondary | ICD-10-CM | POA: Insufficient documentation

## 2017-06-05 DIAGNOSIS — Z7982 Long term (current) use of aspirin: Secondary | ICD-10-CM | POA: Insufficient documentation

## 2017-06-05 DIAGNOSIS — T675XXA Heat exhaustion, unspecified, initial encounter: Secondary | ICD-10-CM

## 2017-06-05 DIAGNOSIS — I252 Old myocardial infarction: Secondary | ICD-10-CM | POA: Diagnosis not present

## 2017-06-05 DIAGNOSIS — R0602 Shortness of breath: Secondary | ICD-10-CM | POA: Diagnosis not present

## 2017-06-05 DIAGNOSIS — I5042 Chronic combined systolic (congestive) and diastolic (congestive) heart failure: Secondary | ICD-10-CM | POA: Diagnosis not present

## 2017-06-05 DIAGNOSIS — E039 Hypothyroidism, unspecified: Secondary | ICD-10-CM | POA: Insufficient documentation

## 2017-06-05 DIAGNOSIS — I480 Paroxysmal atrial fibrillation: Secondary | ICD-10-CM | POA: Insufficient documentation

## 2017-06-05 DIAGNOSIS — I2510 Atherosclerotic heart disease of native coronary artery without angina pectoris: Secondary | ICD-10-CM | POA: Insufficient documentation

## 2017-06-05 DIAGNOSIS — Z951 Presence of aortocoronary bypass graft: Secondary | ICD-10-CM | POA: Insufficient documentation

## 2017-06-05 DIAGNOSIS — I110 Hypertensive heart disease with heart failure: Secondary | ICD-10-CM | POA: Diagnosis not present

## 2017-06-05 DIAGNOSIS — Z950 Presence of cardiac pacemaker: Secondary | ICD-10-CM | POA: Diagnosis not present

## 2017-06-05 LAB — URINALYSIS, ROUTINE W REFLEX MICROSCOPIC
BACTERIA UA: NONE SEEN
Bilirubin Urine: NEGATIVE
Glucose, UA: NEGATIVE mg/dL
Ketones, ur: NEGATIVE mg/dL
Leukocytes, UA: NEGATIVE
NITRITE: NEGATIVE
PROTEIN: NEGATIVE mg/dL
SPECIFIC GRAVITY, URINE: 1.006 (ref 1.005–1.030)
pH: 7 (ref 5.0–8.0)

## 2017-06-05 LAB — I-STAT TROPONIN, ED
Troponin i, poc: 0.01 ng/mL (ref 0.00–0.08)
Troponin i, poc: 0.01 ng/mL (ref 0.00–0.08)

## 2017-06-05 LAB — CBC
HCT: 31.9 % — ABNORMAL LOW (ref 39.0–52.0)
Hemoglobin: 9.6 g/dL — ABNORMAL LOW (ref 13.0–17.0)
MCH: 23.3 pg — AB (ref 26.0–34.0)
MCHC: 30.1 g/dL (ref 30.0–36.0)
MCV: 77.4 fL — AB (ref 78.0–100.0)
PLATELETS: 287 10*3/uL (ref 150–400)
RBC: 4.12 MIL/uL — ABNORMAL LOW (ref 4.22–5.81)
RDW: 17.6 % — AB (ref 11.5–15.5)
WBC: 8.7 10*3/uL (ref 4.0–10.5)

## 2017-06-05 LAB — BASIC METABOLIC PANEL
Anion gap: 13 (ref 5–15)
BUN: 17 mg/dL (ref 6–20)
CO2: 26 mmol/L (ref 22–32)
CREATININE: 1.79 mg/dL — AB (ref 0.61–1.24)
Calcium: 9.1 mg/dL (ref 8.9–10.3)
Chloride: 97 mmol/L — ABNORMAL LOW (ref 101–111)
GFR calc Af Amer: 41 mL/min — ABNORMAL LOW (ref >60)
GFR calc non Af Amer: 36 mL/min — ABNORMAL LOW (ref >60)
GLUCOSE: 96 mg/dL (ref 65–99)
Potassium: 2.8 mmol/L — ABNORMAL LOW (ref 3.5–5.1)
Sodium: 136 mmol/L (ref 135–145)

## 2017-06-05 LAB — D-DIMER, QUANTITATIVE (NOT AT ARMC): D DIMER QUANT: 1.07 ug{FEU}/mL — AB (ref 0.00–0.50)

## 2017-06-05 LAB — BRAIN NATRIURETIC PEPTIDE: B NATRIURETIC PEPTIDE 5: 628.5 pg/mL — AB (ref 0.0–100.0)

## 2017-06-05 MED ORDER — SODIUM CHLORIDE 0.9 % IV SOLN
INTRAVENOUS | Status: DC
  Administered 2017-06-05: 14:00:00 via INTRAVENOUS

## 2017-06-05 MED ORDER — POTASSIUM CHLORIDE 10 MEQ/100ML IV SOLN
10.0000 meq | Freq: Once | INTRAVENOUS | Status: AC
  Administered 2017-06-05: 10 meq via INTRAVENOUS
  Filled 2017-06-05: qty 100

## 2017-06-05 MED ORDER — IOPAMIDOL (ISOVUE-370) INJECTION 76%
INTRAVENOUS | Status: AC
  Administered 2017-06-05: 100 mL
  Filled 2017-06-05: qty 100

## 2017-06-05 MED ORDER — POTASSIUM CHLORIDE CRYS ER 20 MEQ PO TBCR
40.0000 meq | EXTENDED_RELEASE_TABLET | Freq: Once | ORAL | Status: AC
  Administered 2017-06-05: 40 meq via ORAL
  Filled 2017-06-05: qty 2

## 2017-06-05 NOTE — Discharge Instructions (Signed)
.  During the workup we noted incidental findings on your imaging that would require you to follow-up with your regular doctor for further evaluation/management: 1.  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  Recommend semi-annual imaging followup by CTA or MRA and referral to  cardiothoracic surgery if not already obtained. This recommendation  follows 2010 ACCF/AHA/AATS/ACR/ASA/SCA/SCAI/SIR/STS/SVM Guidelines  for the Diagnosis and Management of Patients With Thoracic Aortic  Disease. Circulation. 2010; 121: Q657-Q469: [id number]   We have provider U with the number to cardiothoracic surgery. Please contact them to schedule follow-up appointment.

## 2017-06-05 NOTE — Telephone Encounter (Signed)
New message  Pt c/o Shortness Of Breath: STAT if SOB developed within the last 24 hours or pt is noticeably SOB on the phone  1. Are you currently SOB (can you hear that pt is SOB on the phone)? Yes    2. How long have you been experiencing SOB?   3. Are you SOB when sitting or when up moving around? Both   4. Are you currently experiencing any other symptoms? Swelling and dizziness

## 2017-06-05 NOTE — Telephone Encounter (Signed)
msg left for Trish - cardmaster - regarding pt arrival via private vehicle.

## 2017-06-05 NOTE — ED Provider Notes (Signed)
[facility]-EMERGENCY DEPT Provider Note   CSN: [id number] Arrival date & time: 06/05/17  1110     History   Chief Complaint Chief Complaint  Patient presents with  . Chest Pain  . Shortness of Breath    HPI [name] is a 76 y.o. male.  Patient followed by Dr. Clent Ridges is his primary care doctor. Patient with complaint of mostly shortness of breath for 4-5 days. Associated with some mild headache. Main complaint shortness of breath also with some mild left-sided chest pain. Ongoing for 4-5 days. No true vertigo. Patient has a history of distant heart failure and has a pacemaker of bleed it's also defibrillator. Patient also was on oral potassium for difficulty with low potassiums due to his Lasix. Patient is also an MRI room.      Past Medical History:  Diagnosis Date  . Anginal pain (HCC)   . Anxiety   . Aortic stenosis    a. s/p bioprosthetic AVR in 2010  . Arthritis    gout  . CAD (coronary artery disease)    a. s/p CABG in 1992 b. redo CABG in 2004 c. cath in 2010 showing patent LIMA-LAD, free RIMA-OM, and SVG-OM, and 80% stenosis of small PLA branch  . Chest pain   . CHF (congestive heart failure) (HCC)   . Dysrhythmia   . GERD (gastroesophageal reflux disease)    with gas and bloating probably cause of chest pain  . H/O atrial flutter    a. s/p ablation and not on anticoagulation secondary to GIB and alcohol abuse.   . H/O: gout   . Hyperlipidemia   . Hypertension   . 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 Ischemic cardiomyopathy    a. EF previously 20-25%, improved to 40-45% by echo in 03/2013)  . Myocardial infarction (HCC)   . Pacemaker   . 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 Shortness of breath     Patient Active Problem List   Diagnosis Date Noted  . Acute on chronic combined systolic and diastolic CHF (congestive heart failure) (HCC) 01/15/2017  . 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 B12 deficiency anemia   . Esophageal reflux   . PAF (paroxysmal atrial fibrillation) (HCC)   . HLD (hyperlipidemia)   . Alcohol withdrawal (HCC) 01/31/2016  . Left-sided weakness 01/29/2016  . Neck pain 01/29/2016  . Clavicle fracture 01/29/2016  . H/O aortic valve replacement 06/04/2014  . 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 CRT-D [employer] 07/12/2012  . HYPOTHYROIDISM 03/14/2009  . Hyperlipidemia 03/14/2009  . GOUT 03/14/2009  . ALCOHOLISM 03/14/2009  . Essential hypertension 03/14/2009  . Cardiomyopathy, ischemic 03/14/2009  . CEREBROVASCULAR DISEASE 03/14/2009  . Peripheral vascular disease (HCC) 03/14/2009  . RENAL INSUFFICIENCY, CHRONIC 03/14/2009  . ANEMIA, HX OF 03/14/2009  . CAROTID ENDARTERECTOMY, LEFT, HX OF 03/14/2009  . CORONARY ARTERY BYPASS GRAFT, HX OF 03/14/2009  . CAD s/p redo CABG 01/17/2009  . ATRIAL FIBRILLATION 01/17/2009  . COMBINED HEART FAILURE, CHRONIC 01/17/2009    Past Surgical History:  Procedure Laterality Date  . BI-VENTRICULAR IMPLANTABLE CARDIOVERTER DEFIBRILLATOR Right 08/18/2012   Procedure: BI-VENTRICULAR IMPLANTABLE CARDIOVERTER DEFIBRILLATOR  (CRT-D);  Surgeon: Marinus Maw, MD;  Location: [facility] CATH LAB;  Service: Cardiovascular;  Laterality: Right;  . CARDIAC CATHETERIZATION  Dec 18, 2003   with a presentation of atrial flutter with rapid ventricular response. He had a cath and two stents  to his PDA after his SVG to his RCA. His other graft were patent. He had LV with EF of 30%.   . CAROTID ENDARTERECTOMY     left in December 2003 and know renal artery stenosis of 60-70%  . CORONARY ARTERY BYPASS GRAFT  1993   redo in 2000, he has had multiple MI previous to both procedures.  . IMPLANTABLE CARDIOVERTER DEFIBRILLATOR (ICD) GENERATOR CHANGE N/A 06/25/2012   Procedure: ICD GENERATOR CHANGE;  Surgeon: Marinus Maw, MD;  Location: [facility] CATH LAB;  Service: Cardiovascular;  Laterality: N/A;  . VALVE  REPLACEMENT  2010       Home Medications    Prior to Admission medications   Medication Sig Start Date End Date Taking? Authorizing Provider  aspirin EC 81 MG tablet Take 81 mg by mouth daily.    Yes [provider]  atorvastatin (LIPITOR) 20 MG tablet TAKE 1 TABLET (20 MG TOTAL) BY MOUTH DAILY. 03/16/17 06/14/17 Yes Croitoru, Mihai, MD  carvedilol (COREG) 3.125 MG tablet TAKE 1 TABLET (3.125 MG TOTAL) BY MOUTH 2 (TWO) TIMES DAILY. 04/22/17 07/21/17 Yes Croitoru, Mihai, MD  furosemide (LASIX) 40 MG tablet Take 2 tablets (80 mg total) by mouth every morning and take 1 tablet (40 mg total) by mouth every evening. 04/15/17  Yes Croitoru, Mihai, MD  nitroGLYCERIN (NITROSTAT) 0.4 MG SL tablet PUT1TAB UNDER TONGUE EVERY AS NEEDED FOR CHESTPAIN, UP TO 3DOSES. IF NOT RELIEVED, CALL EMS/MD 03/30/17  Yes Croitoru, Mihai, MD  potassium chloride SA (KLOR-CON M20) 20 MEQ tablet Take 1 tablet (20 mEq total) by mouth 2 (two) times daily. 02/20/17  Yes Strader, [place] M, PA-C  vitamin B-12 (CYANOCOBALAMIN) 1000 MCG tablet Take 1 tablet (1,000 mcg total) by mouth daily. 02/02/16  Yes Vassie Loll, MD  amiodarone (PACERONE) 200 MG tablet Take 1 tablet (200 mg total) by mouth daily. Patient not taking: Reported on 06/05/2017 12/10/16   Ellsworth Lennox, PA-C  colchicine 0.6 MG tablet Take 1 tablet (0.6 mg total) by mouth as needed (gout attacks). 04/15/17   Croitoru, Rachelle Hora, MD    Family History Family History  Problem Relation Age of Onset  . CAD Brother     Social History Social History  Substance Use Topics  . Smoking status: Never Smoker  . Smokeless tobacco: Current User    Types: Chew  . Alcohol use 8.4 oz/week    14 Cans of beer per week     Allergies   Atorvastatin   Review of Systems Review of Systems  Constitutional: Negative for fever.  HENT: Negative for congestion.   Eyes: Negative for visual disturbance.  Respiratory: Positive for shortness of breath.     Cardiovascular: Positive for chest pain.  Gastrointestinal: Negative for abdominal pain.  Genitourinary: Negative for dysuria.  Musculoskeletal: Negative for back pain.  Skin: Negative for rash.  Neurological: Positive for dizziness.  Hematological: Does not bruise/bleed easily.  Psychiatric/Behavioral: Negative for confusion.     Physical Exam Updated Vital Signs BP 123/62   Pulse 65   Temp 97.3 F (36.3 C) (Oral)   Resp 16   SpO2 100%   Physical Exam  Constitutional: He is oriented to person, place, and time. He appears well-developed and well-nourished. No distress.  HENT:  Head: Normocephalic and atraumatic.  Mouth/Throat: Oropharynx is clear and moist.  Eyes: EOM are normal. Pupils are equal, round, and reactive to light.  Neck: Normal range of motion. Neck supple.  Cardiovascular: Normal rate, regular rhythm and normal  heart sounds.   Pulmonary/Chest: Effort normal and breath sounds normal.  Abdominal: Soft. Bowel sounds are normal.  Musculoskeletal: He exhibits edema.  Neurological: He is alert and oriented to person, place, and time. No cranial nerve deficit or sensory deficit. He exhibits normal muscle tone. Coordination normal.  Skin: Skin is warm.  Nursing note and vitals reviewed.    ED Treatments / Results  Labs (all labs ordered are listed, but only abnormal results are displayed) Labs Reviewed  BASIC METABOLIC PANEL - Abnormal; Notable for the following:       Result Value   Potassium 2.8 (*)    Chloride 97 (*)    Creatinine, Ser 1.79 (*)    GFR calc non Af Amer 36 (*)    GFR calc Af Amer 41 (*)    All other components within normal limits  CBC - Abnormal; Notable for the following:    RBC 4.12 (*)    Hemoglobin 9.6 (*)    HCT 31.9 (*)    MCV 77.4 (*)    MCH 23.3 (*)    RDW 17.6 (*)    All other components within normal limits  URINALYSIS, ROUTINE W REFLEX MICROSCOPIC - Abnormal; Notable for the following:    Color, Urine STRAW (*)    Hgb  urine dipstick SMALL (*)    Squamous Epithelial / LPF 0-5 (*)    All other components within normal limits  BRAIN NATRIURETIC PEPTIDE - Abnormal; Notable for the following:    B Natriuretic Peptide 628.5 (*)    All other components within normal limits  D-DIMER, QUANTITATIVE (NOT AT [facility]) - Abnormal; Notable for the following:    D-Dimer, Quant 1.07 (*)    All other components within normal limits  I-STAT TROPOININ, ED    EKG  EKG Interpretation  Date/Time:  Friday June 05 2017 11:20:01 EDT Ventricular Rate:  60 PR Interval:    QRS Duration: 202 QT Interval:  570 QTC Calculation: 570 R Axis:   -92 Text Interpretation:  Ventricular-paced rhythm Confirmed by Lincoln Brighamees, Liz [phone]([id number]) on 06/05/2017 11:23:11 AM       Radiology Dg Chest 2 View  Result Date: 06/05/2017 CLINICAL DATA:  Shortness of breath and chest pain EXAM: CHEST  2 VIEW COMPARISON:  January 29, 2016 FINDINGS: There is no edema or consolidation. Heart is mildly enlarged with pulmonary vascularity within normal limits. Pacemaker leads are attached to the right atrium, right ventricle, and coronary sinus. Patient is status post coronary artery bypass grafting. There is aortic atherosclerosis. No adenopathy. No bone lesions. IMPRESSION: No edema or consolidation. Stable cardiomegaly with stable postoperative changes. Aortic atherosclerosis. Electronically Signed   By: Bretta BangWilliam  Woodruff III M.D.   On: 06/05/2017 12:31   Ct Head Wo Contrast  Result Date: 06/05/2017 CLINICAL DATA:  Dizziness. EXAM: CT HEAD WITHOUT CONTRAST TECHNIQUE: Contiguous axial images were obtained from the base of the skull through the vertex without intravenous contrast. COMPARISON:  01/29/2016 FINDINGS: Brain: Stable age related cerebral atrophy, ventriculomegaly and periventricular white matter disease. No extra-axial fluid collections are identified. No CT findings for acute hemispheric infarction or intracranial hemorrhage. No mass lesions. The brainstem  and cerebellum are normal. Vascular: No hyperdense vessel or unexpected calcification. Moderate stable atherosclerotic calcifications. Skull: No skull lesions or fracture. Sinuses/Orbits: The paranasal sinuses and mastoid air cells are clear. The globes are intact. Other: No scalp lesions or hematoma. IMPRESSION: Stable age related cerebral atrophy, ventriculomegaly and periventricular white matter disease. No acute intracranial findings or  mass lesions. Electronically Signed   By: Rudie Meyer M.D.   On: 06/05/2017 15:23    Procedures Procedures (including critical care time)  CRITICAL CARE Performed by: Vanetta Mulders Total critical care time: 30 minutes Critical care time was exclusive of separately billable procedures and treating other patients. Critical care was necessary to treat or prevent imminent or life-threatening deterioration. Critical care was time spent personally by me on the following activities: development of treatment plan with patient and/or surrogate as well as nursing, discussions with consultants, evaluation of patient's response to treatment, examination of patient, obtaining history from patient or surrogate, ordering and performing treatments and interventions, ordering and review of laboratory studies, ordering and review of radiographic studies, pulse oximetry and re-evaluation of patient's condition   Medications Ordered in ED Medications  0.9 %  sodium chloride infusion ( Intravenous New Bag/Given 06/05/17 1344)  iopamidol (ISOVUE-370) 76 % injection (not administered)  potassium chloride 10 mEq in 100 mL IVPB (0 mEq Intravenous Stopped 06/05/17 1628)  potassium chloride 10 mEq in 100 mL IVPB (0 mEq Intravenous Stopped 06/05/17 1444)  potassium chloride SA (K-DUR,KLOR-CON) CR tablet 40 mEq (40 mEq Oral Given 06/05/17 1348)     Initial Impression / Assessment and Plan / ED Course  I have reviewed the triage vital signs and the nursing notes.  Pertinent labs &  imaging results that were available during my care of the patient were reviewed by me and considered in my medical decision making (see chart for details).     Patient with a 4-5 day history of shortness of breath and some dizziness no true vertigo. Dizziness brought on by kind of standing up rapidly. Patient feels that symptoms been getting worse. There's been some left-sided chest discomfort. The patient wasn't overly concerned about that main complaint was shortness of breath. Patient primary care doctors Dr. Clent Ridges. Patient has a history of congestive heart failure. Also has an implantable cardiac defibrillator.   Monitoring of his pacemaker will be done.  Patient's d-dimer was elevated. We'll get CT angios. If not able to do it based on his creatinine of 1.7 and will require VQ scan. Patient also with hypokalemia here. Patient normally has trouble with low potassium. Probably secondary to Lasix. But it is 2.8 here today given some IV potassium and oral potassium.  We'll have nurses make sure that his pacemaker gets interrogated.   Patient's chest x-ray here without any acute findings. No evidence pulmonary edema. CT angios may show other findings. We'll also repeat troponin will get interrogation of his pacemaker.   CT results will be followed up by evening physician.  Final Clinical Impressions(s) / ED Diagnoses   Final diagnoses:  SOB (shortness of breath)  Hypokalemia    New Prescriptions New Prescriptions   No medications on file     Vanetta Mulders, MD 06/05/17 1756

## 2017-06-05 NOTE — ED Notes (Signed)
ED Provider at bedside. 

## 2017-06-05 NOTE — ED Notes (Signed)
Pt ICD interrogated by this nurse.

## 2017-06-05 NOTE — ED Provider Notes (Signed)
I assumed care of this patient from Dr. Deretha EmoryZackowski at 1700.  Please see their note for further details of Hx, PE.  Briefly patient is a 76 y.o. male who presents with chest discomfort and shortness of breath. Off: Provider had low suspicion for ACS or CHF exacerbation. Was most concerned for pulmonary embolism, given positive d-dimer. EKG without acute ischemic changes. Troponins negative 2. Patient was noted to have hypokalemia for which she is already taking potassium at home. IV and oral repletion were given here. Current plan is to follow-up CTA to assess for pulmonary embolism.  CTA without evidence of pulmonary embolism or pulmonary findings. 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 This was discussed with Dr. Dorris FetchHendrickson from cardiothoracic surgery who recommended outpatient follow-up in clinic, as she is currently asymptomatic. His symptoms do not appear to be related to the aneurysm itself.  On reassessment patient was able to ambulate without complication. Orthostatics negative. On further questioning patient reported that his symptoms are associated with elevated environmental temperatures. 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 His shortness of breath improves with rest and going into the house with cooler. It returns if he goes outside in the heat again.  I agree that the symptoms are not consistent with ACS or CHF exacerbation.  I have discussed the CTA findings with the patient, and instructed him to follow up closely with cardiothoracic surgery.  The patient is safe for discharge with strict return precautions.  Disposition: Discharge  Condition: Good  I have discussed the results, Dx and Tx plan with the patient who expressed understanding and agree(s) with the plan. Discharge instructions discussed at great length. The patient was given strict  return precautions who verbalized understanding of the instructions. No further questions at time of discharge.    New Prescriptions   No medications on file    Follow Up: Loreli SlotHendrickson, Steven C, MD [address] [place] [address] [phone]  Schedule an appointment as soon as possible for a visit    Primary care provider  Schedule an appointment as soon as possible for a visit  As needed      Nira Connardama, Doral Digangi Eduardo, MD 06/05/17 2128

## 2017-06-05 NOTE — ED Notes (Signed)
Patient transported to CT 

## 2017-06-05 NOTE — ED Notes (Signed)
[employer]tr[employer] paperwork given to Dr. Eudelia Bunchardama.

## 2017-06-05 NOTE — ED Notes (Signed)
CT notified pt ready for scan 

## 2017-06-05 NOTE — ED Triage Notes (Signed)
Pt reports left sided chest pain, dizziness, headache and SOB; onset 4-5 days ago and is progressively getting worse.

## 2017-06-05 NOTE — ED Notes (Signed)
Patient transported to X-ray 

## 2017-06-05 NOTE — ED Notes (Signed)
Charge RN notified of need for [employer] to be interrogated.

## 2017-06-05 NOTE — Telephone Encounter (Signed)
Pt c/o SOB. Notes he's always "a little short" but now noticeably worse in last 4-5 days and especially worse this morning. He has hx of A Fib, CHF. Takes lasix for fluid regulation, notes no swelling in legs or weight gain.  Pt does not have recent VS at home - no BPs or HRs to report.  I did advised that since his dyspnea is noted to be worse today, I think it would be wise to go to ED for evaluation. Pt voiced understanding & thanks - [facility].

## 2017-06-23 ENCOUNTER — Other Ambulatory Visit: Payer: Self-pay | Admitting: Cardiovascular Disease

## 2017-07-10 ENCOUNTER — Other Ambulatory Visit: Payer: Self-pay

## 2017-07-10 MED ORDER — FUROSEMIDE 40 MG PO TABS: ORAL_TABLET | ORAL | 3 refills | Status: DC

## 2017-07-15 ENCOUNTER — Encounter: Payer: Medicare Other | Admitting: *Deleted

## 2017-07-15 ENCOUNTER — Telehealth: Payer: Self-pay | Admitting: Cardiology

## 2017-07-15 NOTE — Telephone Encounter (Signed)
LMOVM reminding pt to send remote transmission.   

## 2017-07-17 ENCOUNTER — Encounter: Payer: Self-pay | Admitting: Cardiology

## 2017-07-19 NOTE — Progress Notes (Signed)
Cardiology Office Note    Date:  07/20/2017   ID:  [name] 1941-09-13, MRN [id number]  PCP:  [name] A  Cardiologist: Dr. Royann [name]    Chief Complaint  Patient presents with  . Follow-up    3 months    History of Present Illness:    [name] is a 76 y.o. 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   He was last examined by Dr. Royann [name] on 04/15/2017 and reported doing well from a cardiac perspective at that time. Was still consuming several shots of liquor per day. Weight was stable at 180 lbs, therefore Lasix dosing was reduced to 80mg  in the AM and 40mg  in the PM.   The patient called the office on 06/05/2017 and reported acute worsening of his dyspnea and episodes of chest discomfort with instructions to proceed to the ED for further evaluation. Initial and delta troponin were negative. BNP at 628. D-dimer was elevated to 1.07, therefore a CTA was ordered to rule out a PE. This showed no evidence of a PE but a 5 cm ascending thoracic aortic aneurysm was noted with semi-annual imaging recommended.    In talking with the patient today, he reports overall doing well since his last visit. He denies any recent orthopnea, PND, or lower extremity edema. Has baseline dyspnea on exertion but denies any acute worsening of this. 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 He denies any recent palpitations or chest discomfort.  Weights have fluctuated on his home scales and have mostly been in the low 180's. Around the time of his recent ED visit, weight was in the high 170's by his report which he thinks was due to dehydration due to working in the heat. He has switched from consuming mixed drinks to beer, saying he drinks 2-4 beers per day.    Past Medical History:  Diagnosis Date  . Anginal pain (HCC)   . Anxiety   . Aortic stenosis    a. s/p bioprosthetic AVR in 2010  . Arthritis    gout  . CAD (coronary artery disease)    a. s/p CABG in 1992 b. redo CABG in 2004 c. cath in 2010 showing patent LIMA-LAD, free RIMA-OM, and SVG-OM, and 80% stenosis of small PLA branch  . Chest pain   . CHF (congestive heart failure) (HCC)   . Dysrhythmia   . GERD (gastroesophageal reflux disease)    with gas and bloating probably cause of chest pain  . H/O atrial flutter    a. s/p ablation and not on anticoagulation secondary to GIB and alcohol abuse.   . H/O: gout   . Hyperlipidemia   . Hypertension   . 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 Ischemic cardiomyopathy    a. EF previously 20-25%, improved to  40-45% by echo in 03/2013)  . Myocardial infarction (HCC)   . Pacemaker   . 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 Shortness of breath     Past Surgical History:  Procedure Laterality Date  . BI-VENTRICULAR IMPLANTABLE CARDIOVERTER DEFIBRILLATOR Right 08/18/2012   Procedure: BI-VENTRICULAR IMPLANTABLE CARDIOVERTER DEFIBRILLATOR  (CRT-D);  Surgeon: Marinus MawGregg W Taylor, MD;  [facility];  Service: Cardiovascular;  Laterality: Right;  . CARDIAC CATHETERIZATION  Dec 18, 2003   with a presentation of atrial flutter with rapid ventricular response. He had a cath and two stents to his PDA after his SVG to his RCA. His other graft were  patent. He had LV with EF of 30%.   . CAROTID ENDARTERECTOMY     left in December 2003 and know renal artery stenosis of 60-70%  . CORONARY ARTERY BYPASS GRAFT  1993   redo in 2000, he has had multiple MI previous to both procedures.  . IMPLANTABLE CARDIOVERTER DEFIBRILLATOR (ICD) GENERATOR CHANGE N/A 06/25/2012   Procedure: ICD GENERATOR CHANGE;  Surgeon: Marinus MawGregg W Taylor, MD;  [facility];  Service: Cardiovascular;  Laterality: N/A;  . VALVE REPLACEMENT  2010    Current Medications: Outpatient Medications Prior to Visit  Medication Sig Dispense Refill  . amiodarone (PACERONE) 200 MG tablet Take 1 tablet (200 mg total) by mouth daily. 30 tablet 11  . aspirin EC 81 MG tablet Take 81 mg by mouth daily.     [name]. atorvastatin (LIPITOR) 20 MG tablet TAKE 1 TABLET (20 MG TOTAL) BY MOUTH DAILY. 30 tablet 6  . carvedilol (COREG) 3.125 MG tablet TAKE 1 TABLET (3.125 MG TOTAL) BY MOUTH 2 (TWO) TIMES DAILY. 30 tablet 11  . colchicine 0.6 MG tablet Take 1 tablet (0.6 mg total) by mouth as needed (gout attacks). 30 tablet 3  . furosemide (LASIX) 40 MG tablet Take 2 tablets (80 mg total) by mouth every morning and take 1 tablet (40 mg total) by mouth every evening. 120 tablet 3  . nitroGLYCERIN (NITROSTAT) 0.4 MG SL tablet PUT1TAB UNDER TONGUE EVERY 5MINS AS NEEDED FOR CHESTPAIN, UP TO 3DOSES. IF NOT RELIEVED, CALL EMS/MD 25 tablet 0  . potassium chloride SA (KLOR-CON M20) 20 MEQ tablet Take 1 tablet (20 mEq total) by mouth 2 (two) times daily. 75 tablet 11  . vitamin B-12 (CYANOCOBALAMIN) 1000 MCG tablet Take 1 tablet (1,000 mcg total) by mouth daily.     No facility-administered medications prior to visit.      Allergies:   Atorvastatin   Social History   Social History  . Marital status: Married    Spouse name: N/A  . Number of children: N/A  . Years of education: N/A   Social History Main Topics  . Smoking status: Never Smoker  . Smokeless tobacco: Current User    Types: Chew  .  Alcohol use 8.4 oz/week    14 Cans of beer per week  . Drug use: No  . Sexual activity: Not Asked   Other Topics Concern  . None   Social History Narrative  . None     Family History:  The patient's family history includes CAD in his brother.   Review of Systems:   Please see the history of present illness.     General:  No chills, fever, night sweats or weight changes.  Cardiovascular:  No chest pain, edema, orthopnea, palpitations, paroxysmal nocturnal dyspnea. Positive for dyspnea on exertion (  at baseline).  Dermatological: No rash, lesions/masses Respiratory: No cough, dyspnea Urologic: No hematuria, dysuria Abdominal:   No nausea, vomiting, diarrhea, bright red blood per rectum, melena, or hematemesis Neurologic:  No visual changes, wkns, changes in mental status. All other systems reviewed and are otherwise negative except as noted above.   Physical Exam:    VS:  BP 124/60   Pulse 60   Ht 6' (1.829 m)   Wt 184 lb (83.5 kg)   BMI 24.95 kg/m    General: Well developed, well nourished Caucasian male appearing in no acute distress. Head: Normocephalic, atraumatic, sclera non-icteric, no xanthomas, nares are without discharge.  Neck: No carotid bruits. JVD not elevated.  Lungs: Respirations regular and unlabored, without wheezes or rales.  Heart: Regular rate and rhythm. No S3 or S4.  No murmur, no rubs, or gallops appreciated. Device site noted.  Abdomen: Soft, non-tender, non-distended with normoactive bowel sounds. No hepatomegaly. No rebound/guarding. No obvious abdominal masses. Msk:  Strength and tone appear normal for age. No joint deformities or effusions. Extremities: No clubbing or cyanosis. No lower extremity edema.  Distal pedal pulses are 2+ bilaterally. Varicose veins present bilaterally. Neuro: Alert and oriented X 3. Moves all extremities spontaneously. No focal deficits noted. Psych:  Responds to questions appropriately with a normal affect. Skin: No  rashes or lesions noted  Wt Readings from Last 3 Encounters:  07/20/17 184 lb (83.5 kg)  04/15/17 180 lb (81.6 kg)  03/24/17 185 lb 12.8 oz (84.3 kg)    Studies/Labs Reviewed:   EKG:  EKG is not ordered today. EKG from 06/05/2017 is reviewed which shows V-pacing, HR 60.  Recent Labs: 12/19/2016: ALT 13; TSH 9.39 02/06/2017: Magnesium 1.8 06/05/2017: B Natriuretic Peptide 628.5; BUN 17; Creatinine, Ser 1.79; Hemoglobin 9.6; Platelets 287; Potassium 2.8; Sodium 136   Lipid Panel    Component Value Date/Time   CHOL 158 12/19/2016 0841   TRIG 74 12/19/2016 0841   HDL 38 (L) 12/19/2016 0841   CHOLHDL 4.2 12/19/2016 0841   VLDL 15 12/19/2016 0841   LDLCALC 105 (H) 12/19/2016 0841    Additional studies/ records that were reviewed today include:   Echocardiogram: 03/23/2013 Study Conclusions  - Left ventricle: The cavity size was moderately dilated. There was moderate concentric hypertrophy. Systolic function was mildly to moderately reduced. The estimated ejection fraction was in the range of 40% to 45%. Moderate hypokinesis of the inferolateral myocardium. Mild hypokinesis of the inferior myocardium. Doppler parameters are consistent with abnormal left ventricular relaxation (grade 1 diastolic dysfunction). - Aortic valve: A bioprosthesis was present. Valve area: 1.48cm^2(VTI). Valve area: 1.35cm^2 (Vmax). - Mitral valve: Calcified annulus. Mild regurgitation. Valve area by pressure half-time: 1.31cm^2. Valve area by continuity equation (using LVOT flow): 2.68cm^2. - Left atrium: The atrium was mildly to moderately dilated. - Right atrium: The atrium was mildly dilated. - Atrial septum: No defect or patent foramen ovale was identified. Impressions:  - Bi-V ICD with Bi-V pacding. Segmental wall motion abnormalities with EF approximately =>40%. Stable peridardial AVR. No significant intra-ventricular dyssynchrony.  Assessment:    1. Chronic  combined systolic and diastolic heart failure (HCC)   2. Coronary artery disease involving autologous vein coronary bypass graft without angina pectoris   3. 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 H/O aortic valve replacement   5. Medication management   6. PAF (paroxysmal atrial fibrillation) (HCC)   7. CKD (chronic kidney disease) stage 3, GFR 30-59 ml/min   8. Abdominal aortic  aneurysm (AAA) without rupture (HCC)      Plan:   In order of problems listed above:  1. Chronic Combined Systolic and Diastolic CHF - EF 40-45% by echo in 2014. Weights have been stable between 180 lbs to 185 lbs since his last office visit.  - he does not appear volume overloaded by physical examination.  - has baseline dyspnea on exertion but denies any acute worsening of this. No recent orthopnea, lower extremity edema or PND.  - continue Lasix 80mg  in AM and 40mg  in PM. Recheck BMET today to assess K+ and kidney function.   2. CAD - s/p CABG in 1992 and redo CABG in 2004, cath in 2010 showing patent LIMA-LAD, free RIMA-OM, and SVG-OM, and 80% stenosis of small PLA branch. - he denies any recent chest pain or acute changes in his respiratory status. - continue ASA, BB, and statin therapy.   3. SA node dysfunction - s/p Medtronic ICD placement 06/2012 - he reports being unable to submit remote transmission due to his device not working. I have sent a staff message to the device clinic to follow-up on this.   4. Severe AS - s/p bioprosthetic AVR in 2010.  5. PAF  - s/p ablation. Device interrogation in 03/2017 showed he was Bi-V pacing 99.8% of the time.  - This patients CHA2DS2-VASc Score and unadjusted Ischemic Stroke Rate (% per year) is equal to 4.8 % stroke rate/year from a score of 4 (CHF, Vascular, HTN, Age). He denies any evidence of active bleeding. Not on anticoagulation secondary to history of GI bleed and continued alcohol use.   6. Stage 3 CKD - baseline  creatinine 1.2 - 1.4. - elevated to 1.79 on 06/05/2017. Recheck BMET today.   7. AAA - 5 cm by CT in 05/2017.  - will need semi-annual imaging with repeat CTA.     Medication Adjustments/Labs and Tests Ordered: Current medicines are reviewed at length with the patient today.  Concerns regarding medicines are outlined above.  Medication changes, Labs and Tests ordered today are listed in the Patient Instructions below. Patient Instructions  Medication Instructions: No changes   Labwork: Your physician recommends that you have the following lab drawn today: BMET  Follow-Up: Your physician recommends that you schedule a follow-up appointment in: 3 months with Dr. Royann [name].  Any Additional Special Instructions Will Be Listed Below (If Applicable). *We will call you with the lab results and further instructions with the potassium if needed.  If you need a refill on your cardiac medications before your next appointment, please call your pharmacy.     Signed, Ellsworth Lennox, PA-C  07/20/2017 4:37 PM    [facility] [address] [place], [address] [place], [place]  [address] Phone: [phone]; Fax: [phone]  [address] [place], [place] [address] Phone: [phone]

## 2017-07-20 ENCOUNTER — Encounter: Payer: Self-pay | Admitting: Student

## 2017-07-20 ENCOUNTER — Ambulatory Visit (INDEPENDENT_AMBULATORY_CARE_PROVIDER_SITE_OTHER): Payer: Medicare Other | Admitting: Student

## 2017-07-20 VITALS — BP 124/60 | HR 60 | Ht 72.0 in | Wt 184.0 lb

## 2017-07-20 DIAGNOSIS — I714 Abdominal aortic aneurysm, without rupture, unspecified: Secondary | ICD-10-CM

## 2017-07-20 DIAGNOSIS — Z95810 Presence of automatic (implantable) cardiac defibrillator: Secondary | ICD-10-CM | POA: Diagnosis not present

## 2017-07-20 DIAGNOSIS — I5042 Chronic combined systolic (congestive) and diastolic (congestive) heart failure: Secondary | ICD-10-CM

## 2017-07-20 DIAGNOSIS — N183 Chronic kidney disease, stage 3 unspecified: Secondary | ICD-10-CM

## 2017-07-20 DIAGNOSIS — I480 Paroxysmal atrial fibrillation: Secondary | ICD-10-CM

## 2017-07-20 DIAGNOSIS — Z79899 Other long term (current) drug therapy: Secondary | ICD-10-CM

## 2017-07-20 DIAGNOSIS — Z952 Presence of prosthetic heart valve: Secondary | ICD-10-CM | POA: Diagnosis not present

## 2017-07-20 DIAGNOSIS — I25810 Atherosclerosis of coronary artery bypass graft(s) without angina pectoris: Secondary | ICD-10-CM | POA: Diagnosis not present

## 2017-07-20 NOTE — Patient Instructions (Signed)
Medication Instructions: No changes   Labwork: Your physician recommends that you have the following lab drawn today: BMET   Follow-Up: Your physician recommends that you schedule a follow-up appointment in: 3 months with Dr. Royann Shiversroitoru.   Any Additional Special Instructions Will Be Listed Below (If Applicable). *We will call you with the lab results and further instructions with the potassium if needed.     If you need a refill on your cardiac medications before your next appointment, please call your pharmacy.

## 2017-07-20 NOTE — Progress Notes (Signed)
You are officially best friends! MCr

## 2017-07-21 ENCOUNTER — Telehealth: Payer: Self-pay | Admitting: Cardiology

## 2017-07-21 LAB — BASIC METABOLIC PANEL
BUN / CREAT RATIO: 17 (ref 10–24)
BUN: 22 mg/dL (ref 8–27)
CO2: 27 mmol/L (ref 20–29)
Calcium: 9.5 mg/dL (ref 8.6–10.2)
Chloride: 96 mmol/L (ref 96–106)
Creatinine, Ser: 1.33 mg/dL — ABNORMAL HIGH (ref 0.76–1.27)
GFR calc Af Amer: 60 mL/min/{1.73_m2} (ref >59)
GFR, EST NON AFRICAN AMERICAN: 52 mL/min/{1.73_m2} — AB (ref >59)
Glucose: 80 mg/dL (ref 65–99)
Potassium: 4 mmol/L (ref 3.5–5.2)
SODIUM: 141 mmol/L (ref 134–144)

## 2017-07-21 NOTE — Telephone Encounter (Signed)
LMOVM for pt to return call 

## 2017-07-21 NOTE — Telephone Encounter (Signed)
-----   Message from Ellsworth LennoxBrittany M Strader, [place] sent at 07/20/2017  3:49 PM EDT ----- Regarding: Remote Device Transmission Hi Britta MccreedyBarbara,   I saw this patient in clinic today and he said his transmitter is not working to send remote device transmissions. He has a Medtronic device and I did not know if one of the reps could reach out to him or someone from the device clinic could follow-up as it is not clear if it is the actual device transmitter or human error.   Thank you for your help!  Best,  [place]

## 2017-07-22 NOTE — Telephone Encounter (Signed)
2nd attempt  LMOVM for pt to return call.  

## 2017-07-30 NOTE — Telephone Encounter (Signed)
3rd attempt   LMOVM for pt to return call.  

## 2017-08-06 ENCOUNTER — Encounter: Payer: Self-pay | Admitting: *Deleted

## 2017-08-28 ENCOUNTER — Other Ambulatory Visit: Payer: Self-pay

## 2017-08-28 MED ORDER — CARVEDILOL 3.125 MG PO TABS: 3.1250 mg | ORAL_TABLET | Freq: Two times a day (BID) | ORAL | 11 refills | Status: DC

## 2017-09-24 ENCOUNTER — Other Ambulatory Visit: Payer: Self-pay | Admitting: Cardiovascular Disease

## 2017-10-21 ENCOUNTER — Telehealth: Payer: Self-pay | Admitting: Student

## 2017-10-21 NOTE — Telephone Encounter (Signed)
He has a history of adjusting his own medications. He was previously taking Lasix 80mg  in AM and 40mg  in PM. Please encourage him to resume this dosing if he has decreased it over the past several months. If compliant with this dosing, increase to 80mg  BID until weight returns to normal.   Thanks,  [place]

## 2017-10-21 NOTE — Telephone Encounter (Signed)
Tried to call pt but home phone just keeps ringing will try to call later

## 2017-10-21 NOTE — Telephone Encounter (Signed)
cell phone "not accepting phone calls at this time"  Returned call to pt he states that he has been have SOB x6 months but it has been getting worse for the last 2 weeks, this is exertional only. He states that it is intermittent. He also has "a little" bilat ankle swelling and bilat LE pain. He states that his weight is not up sp he states that he did not want to take an extra lasix. Denies any chest pain or pressure, headache nausea is only intermittent when he stands up but not all the time. He has not taken is weight or BP today, or lately. Made an appt 11-6 with Strader.  Please advise

## 2017-10-21 NOTE — Telephone Encounter (Signed)
Follow up    Call on the cellphone , home phone has not been working for several days

## 2017-10-21 NOTE — Telephone Encounter (Signed)
Pt c/o Shortness Of Breath: STAT if SOB developed within the last 24 hours or pt is noticeably SOB on the phone  1. Are you currently SOB (can you hear that pt is SOB on the phone)? no 2. How long have you been experiencing SOB? 2 weeks has gotten worse   3. Are you SOB when sitting or when up moving around? both  4. Are you currently experiencing any other symptoms? Legs and feet swelling and pain

## 2017-10-22 NOTE — Telephone Encounter (Signed)
Left message

## 2017-10-25 NOTE — Progress Notes (Signed)
Cardiology Office Note    Date:  10/27/2017   ID:  PAIGE VANDERWOUDE, DOB [birth year], MRN [id number]  PCP:  Vivien Rossetti A  Cardiologist: Dr. Royann Shivers   Chief Complaint  Patient presents with  . Follow-up    worsening dyspnea, weight gain     History of Present Illness:    [name] is a 76 y.o. 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   He was last examined by myself on 07/20/2017 and reported doing well from a cardiac perspective at that time. Was still consuming 2-4 beers per day. Weight was stable at 184 lbs and he was continued on Lasix 80mg  in AM/40mg  in PM.   The patient called the office on 10/31 reporting worsening shortness of breath and edema for the past 2 weeks. Therefore, a follow-up appointment was scheduled.   In talking with the patient today, he reports worsening orthopnea, lower extremity edema, and weight gain for the past several weeks. He does weigh regularly at home and has noted a weight gain of over 10 lbs in the past two weeks. Weight was at 184 lbs in 06/2017, at 192 lbs during today's visit.   He denies any recent chest pain, palpitations, or presyncope. He reports good compliance with his medication regimen and has been taking Lasix 80mg  in AM and 40mg  in PM. He sometimes takes an additional tablet in the evening hours but does not feel like this influences his  urination or weight. He has reduced his alcohol consumption to approximately 2-3 beers per day.    Past Medical History:  Diagnosis Date  . Anginal pain (HCC)   . Anxiety   . Aortic stenosis    a. s/p bioprosthetic AVR in 2010  . Arthritis    gout  . CAD (coronary artery disease)    a. s/p CABG in 1992 b. redo CABG in 2004 c. cath in 2010 showing patent LIMA-LAD, free RIMA-OM, and SVG-OM, and 80% stenosis of small PLA branch  . Chest pain   . CHF (congestive heart failure) (HCC)   . Dysrhythmia   . GERD (gastroesophageal reflux disease)    with gas and bloating probably cause of chest pain  . H/O atrial flutter    a. s/p ablation and not on anticoagulation secondary to GIB and alcohol abuse.   . H/O: gout   . Hyperlipidemia   . Hypertension   . 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 Ischemic cardiomyopathy    a. EF previously 20-25%, improved to 40-45% by echo in 03/2013)  . Myocardial infarction (HCC)   . Pacemaker   . 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 Shortness of breath     Past Surgical History:  Procedure Laterality Date  . CARDIAC CATHETERIZATION  Dec 18, 2003   with a presentation of atrial flutter  with rapid ventricular response. He had a cath and two stents to his PDA after his SVG to his RCA. His other graft were patent. He had LV with EF of 30%.   . CAROTID ENDARTERECTOMY     left in December 2003 and know renal artery stenosis of 60-70%  . CORONARY ARTERY BYPASS GRAFT  1993   redo in 2000, he has had multiple MI previous to both procedures.  [name]. VALVE REPLACEMENT  2010    Current Medications: Outpatient Medications Prior to Visit  Medication Sig Dispense Refill  . amiodarone (PACERONE) 200 MG tablet Take 1 tablet (200 mg total) by mouth daily. 30 tablet 11  . aspirin EC 81 MG tablet Take 81 mg by mouth daily.     [name]. atorvastatin (LIPITOR) 20 MG tablet TAKE 1 TABLET (20 MG  TOTAL) BY MOUTH DAILY. 30 tablet 9  . carvedilol (COREG) 3.125 MG tablet Take 1 tablet (3.125 mg total) by mouth 2 (two) times daily. 30 tablet 11  . colchicine 0.6 MG tablet Take 1 tablet (0.6 mg total) by mouth as needed (gout attacks). 30 tablet 3  . furosemide (LASIX) 40 MG tablet Take 2 tablets (80 mg total) by mouth every morning and take 1 tablet (40 mg total) by mouth every evening. 120 tablet 3  . nitroGLYCERIN (NITROSTAT) 0.4 MG SL tablet PUT1TAB UNDER TONGUE EVERY 5MINS AS NEEDED FOR CHESTPAIN, UP TO 3DOSES. IF NOT RELIEVED, CALL EMS/MD 25 tablet 0  . potassium chloride SA (KLOR-CON M20) 20 MEQ tablet Take 1 tablet (20 mEq total) by mouth 2 (two) times daily. 75 tablet 11  . vitamin B-12 (CYANOCOBALAMIN) 1000 MCG tablet Take 1 tablet (1,000 mcg total) by mouth daily.     No facility-administered medications prior to visit.      Allergies:   Atorvastatin   Social History   Socioeconomic History  . Marital status: Married    Spouse name: None  . Number of children: None  . Years of education: None  . Highest education level: None  Social Needs  . Financial resource strain: None  . Food insecurity - worry: None  . Food insecurity - inability: None  . Transportation needs - medical: None  . Transportation needs - non-medical: None  Occupational History  . None  Tobacco Use  . Smoking status: Never Smoker  . Smokeless tobacco: Current User    Types: Chew  Substance and Sexual Activity  . Alcohol use: Yes    Alcohol/week: 8.4 oz    Types: 14 Cans of beer per week  . Drug use: No  . Sexual activity: None  Other Topics Concern  . None  Social History Narrative  . None     Family History:  The patient's family history includes CAD in his brother.   Review of Systems:   Please see the history of present illness.     General:  No chills, fever, night sweats or weight changes.  Cardiovascular:  No chest pain, palpitations, paroxysmal nocturnal dyspnea. Positive for  dyspnea on exertion, edema, and orthopnea.  Dermatological: No rash, lesions/masses Respiratory: No cough, dyspnea Urologic: No hematuria, dysuria Abdominal:   No nausea, vomiting, diarrhea, bright red blood per rectum, melena, or hematemesis Neurologic:  No visual changes, wkns, changes in mental status. All other systems reviewed and are otherwise negative except as noted above.   Physical Exam:    VS:  BP 131/75   Pulse 78   Ht 6' (1.829 m)   Hart[employer] FinancialWt  192 lb 6.4 oz (87.3 kg)   BMI 26.09 kg/m    General: Well developed, well nourished Caucasian male appearing in no acute distress. Head: Normocephalic, atraumatic, sclera non-icteric, no xanthomas, nares are without discharge.  Neck: No carotid bruits. JVD at 9cm.  Lungs: Respirations regular and unlabored, mild rales along bases bilaterally.  Heart: Regular rate and rhythm. No S3 or S4.  No murmur, no rubs, or gallops appreciated. Abdomen: Soft, non-tender, non-distended with normoactive bowel sounds. No hepatomegaly. No rebound/guarding. No obvious abdominal masses. Msk:  Strength and tone appear normal for age. No joint deformities or effusions. Extremities: No clubbing or cyanosis. 1+ pitting edema up to mid-shins bilaterally.  Distal pedal pulses are 2+ bilaterally. Neuro: Alert and oriented X 3. Moves all extremities spontaneously. No focal deficits noted. Psych:  Responds to questions appropriately with a normal affect. Skin: No rashes or lesions noted  Wt Readings from Last 3 Encounters:  10/27/17 192 lb 6.4 oz (87.3 kg)  07/20/17 184 lb (83.5 kg)  04/15/17 180 lb (81.6 kg)     Studies/Labs Reviewed:   EKG:  EKG is not ordered today.   Recent Labs: 12/19/2016: ALT 13; TSH 9.39 02/06/2017: Magnesium 1.8 06/05/2017: B Natriuretic Peptide 628.5; Hemoglobin 9.6; Platelets 287 07/20/2017: BUN 22; Creatinine, Ser 1.33; Potassium 4.0; Sodium 141   Lipid Panel    Component Value Date/Time   CHOL 158 12/19/2016 0841   TRIG 74  12/19/2016 0841   HDL 38 (L) 12/19/2016 0841   CHOLHDL 4.2 12/19/2016 0841   VLDL 15 12/19/2016 0841   LDLCALC 105 (H) 12/19/2016 0841    Additional studies/ records that were reviewed today include:   Echocardiogram: 03/2013 Left ventricle: The cavity size was moderately dilated. There was moderate concentric hypertrophy. Systolic function was mildly to moderately reduced. The estimated ejection fraction was 40%. Regional wall motion abnormalities: Moderate hypokinesis of the inferolateral myocardium. Mild hypokinesis of the inferior myocardium. Doppler parameters are consistent with abnormal left ventricular relaxation (grade 1 diastolic dysfunction).  Assessment:    1. Chronic combined systolic and diastolic heart failure (HCC)   2. Coronary artery disease involving autologous vein coronary bypass graft without angina pectoris   3. 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 H/O aortic valve replacement   5. PAF (paroxysmal atrial fibrillation) (HCC)   6. Abdominal aortic aneurysm (AAA) without rupture (HCC)   7. CKD (chronic kidney disease) stage 3, GFR 30-59 ml/min (HCC)   8. Medication management      Plan:   In order of problems listed above:  1. Chronic Combined Systolic and Diastolic CHF - the patient has a known reduced EF of 40-45% by echo in 2014. He presents with worsening dyspnea on exertion, orthopnea, lower extremity edema, and weight gain for the past several weeks. Weight was at 184 lbs in 06/2017, at 192 lbs during today's visit.  - he appears volume overloaded on examination as JVD is elevated and rales are appreciated. He has been compliant with Lasix 80mg  in AM and 40mg  in PM and takes an additional tablet in the evening hours but has not noticed improvement in his symptoms. - will plan for a repeat echocardiogram to assess LV function and valve gradients. Start Metolazone 2.5mg  MWF (previously required Metolazone dosing  earlier this year) and reassess in 2-3 weeks. Will obtain a repeat BMET to examine kidney function and K+ levels. Sodium and fluid restriction reviewed with the patient. His home number has been disconnected and he will  call the office back to confirm his daughter's number so we can call her with the results.   2. CAD - s/p CABG in 1992 and redo CABG in 2004, cath in 2010 showing patent LIMA-LAD, free RIMA-OM, and SVG-OM, and 80% stenosis of small PLA branch.  - he denies any recent anginal symptoms.  - continue ASA, statin, and BB therapy.   3. SSS - s/p Medtronic ICD placement in 06/2012. He has been unable to submit a recent transmission due to his phone line being down but he is working on getting this repaired.   - followed by Dr. Royann Shivers  4. Severe AS - s/p bioprosthetic AVR in 2010. Most recent echocardiogram was in 2014 and showed normal device function.  - will plan for a repeat echocardiogram to reassess LV function and valve gradients.   5. PAF - s/p ablation and not on anticoagulation 2ry to history of GI bleed and continued alcohol use.  - he denies any recent palpitations. Continue Coreg 3.125mg  BID for rate-control.   6. AAA - 5 cm by CT in 05/2017. Follow with annual imaging.   7. Stage 3 CKD - creatinine stable at 1.33 in 06/2017. Will recheck BMET today.    Medication Adjustments/Labs and Tests Ordered: Current medicines are reviewed at length with the patient today.  Concerns regarding medicines are outlined above.  Medication changes, Labs and Tests ordered today are listed in the Patient Instructions below. Patient Instructions  Medication Instructions: Your physician has recommended you make the following change in your medication:  -1) START Metolazone - Take 1 tablet by mouth on Mondays, Wednesdays, and Fridays 30 minutes prior to taking your Lasix (Furosemide)    Labwork: Your physician recommends that you have lab work today:  BMET  Procedures/Testing: Your physician has requested that you have an echocardiogram. Echocardiography is a painless test that uses sound waves to create images of your heart. It provides your doctor with information about the size and shape of your heart and how well your heart's chambers and valves are working. This procedure takes approximately one hour. There are no restrictions for this procedure.  Follow-Up: Your physician recommends that you schedule a follow-up appointment in: 2-3 WEEKS with Dr. Royann Shivers or Randall An  If you need a refill on your cardiac medications before your next appointment, please call your pharmacy.      Signed, Ellsworth Lennox, PA-C  10/27/2017 9:06 AM    [facility] [address] [place], [address] [place], [place]  [address] Phone: [phone]; Fax: [phone]  [address] [place], [place] [address] Phone: [phone]

## 2017-10-27 ENCOUNTER — Ambulatory Visit: Payer: Medicare Other | Admitting: Student

## 2017-10-27 ENCOUNTER — Encounter: Payer: Self-pay | Admitting: Student

## 2017-10-27 VITALS — BP 131/75 | HR 78 | Ht 72.0 in | Wt 192.4 lb

## 2017-10-27 DIAGNOSIS — Z95810 Presence of automatic (implantable) cardiac defibrillator: Secondary | ICD-10-CM

## 2017-10-27 DIAGNOSIS — I480 Paroxysmal atrial fibrillation: Secondary | ICD-10-CM | POA: Diagnosis not present

## 2017-10-27 DIAGNOSIS — Z952 Presence of prosthetic heart valve: Secondary | ICD-10-CM | POA: Diagnosis not present

## 2017-10-27 DIAGNOSIS — Z79899 Other long term (current) drug therapy: Secondary | ICD-10-CM | POA: Diagnosis not present

## 2017-10-27 DIAGNOSIS — I714 Abdominal aortic aneurysm, without rupture, unspecified: Secondary | ICD-10-CM

## 2017-10-27 DIAGNOSIS — N183 Chronic kidney disease, stage 3 unspecified: Secondary | ICD-10-CM

## 2017-10-27 DIAGNOSIS — I5042 Chronic combined systolic (congestive) and diastolic (congestive) heart failure: Secondary | ICD-10-CM

## 2017-10-27 DIAGNOSIS — I25810 Atherosclerosis of coronary artery bypass graft(s) without angina pectoris: Secondary | ICD-10-CM

## 2017-10-27 LAB — BASIC METABOLIC PANEL
BUN / CREAT RATIO: 15 (ref 10–24)
BUN: 25 mg/dL (ref 8–27)
CO2: 27 mmol/L (ref 20–29)
Calcium: 9.7 mg/dL (ref 8.6–10.2)
Chloride: 97 mmol/L (ref 96–106)
Creatinine, Ser: 1.68 mg/dL — ABNORMAL HIGH (ref 0.76–1.27)
GFR calc non Af Amer: 39 mL/min/{1.73_m2} — ABNORMAL LOW (ref >59)
GFR, EST AFRICAN AMERICAN: 46 mL/min/{1.73_m2} — AB (ref >59)
Glucose: 91 mg/dL (ref 65–99)
POTASSIUM: 3.7 mmol/L (ref 3.5–5.2)
SODIUM: 140 mmol/L (ref 134–144)

## 2017-10-27 MED ORDER — METOLAZONE 2.5 MG PO TABS: ORAL_TABLET | ORAL | 3 refills | Status: DC

## 2017-10-27 NOTE — Patient Instructions (Addendum)
Medication Instructions: Your physician has recommended you make the following change in your medication:  -1) START Metolazone - Take 1 tablet by mouth on Mondays, Wednesdays, and Fridays 30 minutes prior to taking your Lasix (Furosemide)    Labwork: Your physician recommends that you have lab work today: BMET  Procedures/Testing: Your physician has requested that you have an echocardiogram. Echocardiography is a painless test that uses sound waves to create images of your heart. It provides your doctor with information about the size and shape of your heart and how well your heart's chambers and valves are working. This procedure takes approximately one hour. There are no restrictions for this procedure.  Follow-Up: Your physician recommends that you schedule a follow-up appointment in: 2-3 WEEKS with Dr. Royann Shiversroitoru or Randall AnBrittany Strader  If you need a refill on your cardiac medications before your next appointment, please call your pharmacy.

## 2017-10-27 NOTE — Telephone Encounter (Signed)
Patient is being seen today by APP

## 2017-10-27 NOTE — Progress Notes (Signed)
Agree. Who will he see when you go to [place]? MCr

## 2017-11-04 ENCOUNTER — Ambulatory Visit (HOSPITAL_COMMUNITY): Payer: Medicare Other | Attending: Cardiology

## 2017-11-04 ENCOUNTER — Other Ambulatory Visit: Payer: Self-pay

## 2017-11-04 DIAGNOSIS — I5042 Chronic combined systolic (congestive) and diastolic (congestive) heart failure: Secondary | ICD-10-CM

## 2017-11-04 DIAGNOSIS — I081 Rheumatic disorders of both mitral and tricuspid valves: Secondary | ICD-10-CM | POA: Insufficient documentation

## 2017-11-04 DIAGNOSIS — I110 Hypertensive heart disease with heart failure: Secondary | ICD-10-CM | POA: Insufficient documentation

## 2017-11-04 DIAGNOSIS — I272 Pulmonary hypertension, unspecified: Secondary | ICD-10-CM | POA: Insufficient documentation

## 2017-11-04 DIAGNOSIS — Z952 Presence of prosthetic heart valve: Secondary | ICD-10-CM | POA: Insufficient documentation

## 2017-11-04 DIAGNOSIS — I340 Nonrheumatic mitral (valve) insufficiency: Secondary | ICD-10-CM | POA: Diagnosis not present

## 2017-11-04 DIAGNOSIS — I712 Thoracic aortic aneurysm, without rupture: Secondary | ICD-10-CM | POA: Insufficient documentation

## 2017-11-18 NOTE — Progress Notes (Signed)
Cardiology Office Note    Date:  11/19/2017   ID:  [name], DOB [birth year], MRN [id number]  PCP:  Vivien Rossetti A  Cardiologist: Dr. Royann Shivers   Chief Complaint  Patient presents with  . Follow-up    3 week follow-up    History of Present Illness:    [name] is a 76 y.o. 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   He was last examined by myself on 10/27/2017 reported worsening orthopnea and dyspnea for the past few weeks. Had noted associated weight gain of 10 pounds on his home scales with weight being at 192 pounds on the office scales at that time (previously 184 lbs in 06/2017). He reported good compliance with Lasix 80 mg in AM. and 40 mg in PM. He was continued on that regimen along with starting Metolazone 2.5 mg on MWF. A repeat echocardiogram was obtained in the interim and showed an EF of 30-35% and normal functioning of his aortic valve prosthesis.  In talking with the patient today, he reports improvement in his dyspnea since his last office visit. He has been weighing himself daily and reports weight has been stable at 184 lbs to 186 lbs. He took Metolazone 3 days last week along with this past Monday and reports significant urination with this. He did however reduce his Lasix to 40mg  BID, due to "not wanting to urinate too much". Denies any recent chest discomfort or  palpitations.    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   Recent thyroid function was checked earlier this year and normal at that time.   When asked about his diet, he reports consuming no sodium but then tells me he will have a "spoonful of salt" if he thinks his blood pressure is getting too low.    Past Medical History:  Diagnosis Date  . Anginal pain (HCC)   . Anxiety   . Aortic stenosis    a. s/p bioprosthetic AVR in 2010  . Arthritis    gout  . CAD (coronary artery disease)    a. s/p CABG in 1992 b. redo CABG in 2004 c. cath in 2010 showing patent LIMA-LAD, free RIMA-OM, and SVG-OM, and 80% stenosis of small PLA branch  . Chest pain   . CHF (congestive heart failure) (HCC)   . Dysrhythmia   . GERD (gastroesophageal reflux disease)    with gas and bloating probably cause of chest pain  . H/O atrial flutter    a. s/p ablation and not on anticoagulation secondary to GIB and alcohol abuse.   . H/O: gout   . Hyperlipidemia   . Hypertension   . 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 Ischemic cardiomyopathy    a. EF previously 20-25%, improved to 40-45% by echo in 03/2013 b. 10/2017: echo  showing of 30-35% with Grade 2 DD, normal function of AVR, moderate MR.   . Myocardial infarction (HCC)   . Pacemaker   . 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 Shortness of breath     Past Surgical History:  Procedure Laterality Date  . BI-VENTRICULAR IMPLANTABLE CARDIOVERTER DEFIBRILLATOR Right 08/18/2012   Procedure: BI-VENTRICULAR IMPLANTABLE CARDIOVERTER DEFIBRILLATOR  (CRT-D);  Surgeon: Marinus MawGregg W Taylor, MD;  [facility];  Service: Cardiovascular;  Laterality: Right;  . CARDIAC CATHETERIZATION  Dec 18, 2003   with a presentation of atrial flutter with rapid ventricular response. He had a cath and two stents to  his PDA after his SVG to his RCA. His other graft were patent. He had LV with EF of 30%.   . CAROTID ENDARTERECTOMY     left in December 2003 and know renal artery stenosis of 60-70%  . CORONARY ARTERY BYPASS GRAFT  1993   redo in 2000, he has had multiple MI previous to both procedures.  . IMPLANTABLE CARDIOVERTER DEFIBRILLATOR (ICD) GENERATOR CHANGE N/A 06/25/2012   Procedure: ICD GENERATOR CHANGE;  Surgeon: Marinus MawGregg W Taylor, MD;  [facility];  Service: Cardiovascular;  Laterality: N/A;  . VALVE REPLACEMENT  2010    Current Medications: Outpatient Medications Prior to Visit  Medication Sig Dispense Refill  . amiodarone (PACERONE) 200 MG tablet Take 1 tablet (200 mg total) by mouth daily. 30 tablet 11  . aspirin EC 81 MG tablet Take 81 mg by mouth daily.     [name]. atorvastatin (LIPITOR) 20 MG tablet TAKE 1 TABLET (20 MG TOTAL) BY MOUTH DAILY. 30 tablet 9  . carvedilol (COREG) 3.125 MG tablet Take 1 tablet (3.125 mg total) by mouth 2 (two) times daily. 30 tablet 11  . colchicine 0.6 MG tablet Take 1 tablet (0.6 mg total) by mouth as needed (gout attacks). 30 tablet 3  . furosemide (LASIX) 40 MG tablet Take 2 tablets (80 mg total) by mouth every morning and take 1 tablet (40 mg total) by mouth every evening. 120 tablet 3  . nitroGLYCERIN (NITROSTAT) 0.4 MG SL tablet PUT1TAB UNDER TONGUE EVERY 5MINS AS NEEDED FOR CHESTPAIN, UP TO 3DOSES. IF NOT RELIEVED, CALL EMS/MD 25 tablet 0  . potassium chloride SA (KLOR-CON M20) 20 MEQ tablet Take 1 tablet (20 mEq total) by mouth 2 (two) times daily. 75 tablet 11  . vitamin B-12 (CYANOCOBALAMIN) 1000 MCG tablet Take 1 tablet (1,000 mcg total) by mouth daily.    . metolazone (ZAROXOLYN) 2.5 MG tablet Take 1 tablet by mouth on Monday, Wednesday, and Friday 30 minutes prior to taking lasix 15 tablet 3   No facility-administered medications prior to visit.      Allergies:   Atorvastatin   Social History   Socioeconomic History  . Marital status:  Married    Spouse name: None  . Number of children: None  . Years of education: None  . Highest education level: None  Social Needs  . Financial resource strain: None  . Food insecurity - worry: None  . Food insecurity - inability: None  . Transportation needs - medical: None  . Transportation needs - non-medical: None  Occupational History  . None  Tobacco Use  . Smoking status: Never Smoker  . Smokeless tobacco: Current User    Types: Chew  Substance and Sexual Activity  . Alcohol use: Yes    Alcohol/week: 8.4 oz    Types: 14 Cans  of beer per week  . Drug use: No  . Sexual activity: None  Other Topics Concern  . None  Social History Narrative  . None     Family History:  The patient's family history includes CAD in his brother.   Review of Systems:   Please see the history of present illness.     General:  No chills, fever, night sweats or weight changes. Positive for fatigue.  Cardiovascular:  No chest pain, dyspnea on exertion, edema, orthopnea, palpitations, paroxysmal nocturnal dyspnea. Dermatological: No rash, lesions/masses Respiratory: No cough, dyspnea Urologic: No hematuria, dysuria Abdominal:   No nausea, vomiting, diarrhea, bright red blood per rectum, melena, or hematemesis Neurologic:  No visual changes, wkns, changes in mental status. All other systems reviewed and are otherwise negative except as noted above.   Physical Exam:    VS:  BP (!) 106/56   Pulse 90   Ht 6\' 1"  (1.854 m)   Wt 188 lb (85.3 kg)   BMI 24.80 kg/m    General: Well developed, well nourished Caucasian male appearing in no acute distress. Head: Normocephalic, atraumatic, sclera non-icteric, no xanthomas, nares are without discharge.  Neck: No carotid bruits. JVD not elevated.  Lungs: Respirations regular and unlabored, without wheezes or rales.  Heart: Regular rate and rhythm. No S3 or S4.  No murmur, no rubs, or gallops appreciated. Abdomen: Soft, non-tender, non-distended  with normoactive bowel sounds. No hepatomegaly. No rebound/guarding. No obvious abdominal masses. Msk:  Strength and tone appear normal for age. No joint deformities or effusions. Extremities: No clubbing or cyanosis. No lower extremity edema.  Distal pedal pulses are 2+ bilaterally. Neuro: Alert and oriented X 3. Moves all extremities spontaneously. No focal deficits noted. Psych:  Responds to questions appropriately with a normal affect. Skin: No rashes or lesions noted  Wt Readings from Last 3 Encounters:  11/19/17 188 lb (85.3 kg)  10/27/17 192 lb 6.4 oz (87.3 kg)  07/20/17 184 lb (83.5 kg)     Studies/Labs Reviewed:   EKG:  EKG is not ordered today.   Recent Labs: 12/19/2016: ALT 13; TSH 9.39 02/06/2017: Magnesium 1.8 06/05/2017: B Natriuretic Peptide 628.5; Hemoglobin 9.6; Platelets 287 10/27/2017: BUN 25; Creatinine, Ser 1.68; Potassium 3.7; Sodium 140   Lipid Panel    Component Value Date/Time   CHOL 158 12/19/2016 0841   TRIG 74 12/19/2016 0841   HDL 38 (L) 12/19/2016 0841   CHOLHDL 4.2 12/19/2016 0841   VLDL 15 12/19/2016 0841   LDLCALC 105 (H) 12/19/2016 0841    Additional studies/ records that were reviewed today include:   Echocardiogram: 11/04/2017 Study Conclusions  - Left ventricle: The cavity size was mildly dilated. Wall   thickness was increased in a pattern of mild LVH. Hypokinesis of   the apex. Basal inferior akinesis. Inferolateral and   anterolateral severe hypokinesis. Systolic function was   moderately to severely reduced. The estimated ejection fraction   was in the range of 30% to 35%. Features are consistent with a   pseudonormal left ventricular filling pattern, with concomitant   abnormal relaxation and increased filling pressure (grade 2   diastolic dysfunction). E/medial e&' > 15, suggesting LV end   diastolic pressure at least 20 mmHg. - Aortic valve: Bioprosthetic aortic valve. No significant   bioprosthetic valve stenosis. There was  no regurgitation. - Aorta: Severe dilation of ascending aorta. Ascending aortic   diameter: 51 mm (S). - Mitral valve: Mildly calcified annulus. Mildly calcified leaflets   .  There was moderate, posteriorly directed regurgitation. - Left atrium: The atrium was moderately dilated. - Right ventricle: The cavity size was mildly dilated. Pacer wire   or catheter noted in right ventricle. Systolic function was   moderately reduced. - Right atrium: The atrium was moderately dilated. - Tricuspid valve: Peak RV-RA gradient (S): 29 mm Hg. - Pulmonary arteries: PA peak pressure: 44 mm Hg (S). - Systemic veins: IVC measured 2.7 cm with < 50% respirophasic   variation, suggesting RA pressure 15 mmHg.  Impressions:  - Mildly dilated LV with mild LV hypertrophy. Wall motion   abnormalities as noted above. EF 30-35%. Moderate diastolic   dysfunction with evidence for elevated LV filling pressure.   Mildly dilated RV with moderately decreased systolic function.   Biatrial enlargement. Normal bioprosthetic aortic valve. Moderate   mitral regurgitation. Mild pulmonary hypertension. Dilated IVC   suggestive of elevated RV filling pressure. 5.1 cm ascending   aortic aneurysm.   Assessment:    1. Chronic combined systolic and diastolic heart failure (HCC)   2. Coronary artery disease involving autologous vein coronary bypass graft without angina pectoris   3. 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 H/O aortic valve replacement with porcine valve   5. PAF (paroxysmal atrial fibrillation) (HCC)   6. AAA (abdominal aortic aneurysm) without rupture (HCC)   7. CKD (chronic kidney disease) stage 3, GFR 30-59 ml/min (HCC)      Plan:   In order of problems listed above:  1. Chronic Combined Systolic and Diastolic CHF - the patient has a known reduced EF of 40-45% by echo in 2014, recent echo showing an EF of 30-35% with Grade 2 DD.  - weight was elevated at the time  of his last office visit to 192 lbs. He was continued on Lasix 80mg  in AM and 40mg  in PM with Metolazone 2.5 mg on MWF being added to his regimen.  - he reports compliance with Metolazone but reduced his Lasix to 40mg  BID, due to "not wanting to urinate too much". Weight has declined to 188 lbs and he says this is 184 lbs on his home scales which is close to baseline.  - will stop Metolazone. Informed the patient he needs to take Lasix as prescribed (80mg  in AM, 40mg  in PM) and limit sodium intake as he notes multiple indiscretions as above.  - continue BB therapy. Previously on ARB but he self-discontinued the medication. I informed the patient to call our office if weight goes above 190 lbs as we can use Metolazone on a PRN basis.   2. CAD - s/p CABG in 1992 and redo CABG in 2004, cath in 2010 showing patent LIMA-LAD, free RIMA-OM, and SVG-OM, and 80% stenosis of small PLA branch.  - he denies any recent chest pain. Has baseline dyspnea on exertion and notes improvement in this following adjustment of his diuretic regimen.  - continue ASA, statin, and BB therapy.   3. SSS - s/p Medtronic ICD placement in 06/2012. Unable to send remote transmissions recently due to his phone not working. Reports this has been repaired.  - followed by Dr. Royann Shivers  4. Severe AS - s/p bioprosthetic AVR in 2010.  - repeat echocardiogram on 11/04/17 showed normal functioning of the valve with no significant stenosis or regurgitation.   5. PAF - s/p ablation and not on anticoagulation secondary to history ofGIB andcontinuedalcohol use.  - no recent palpitations or dyspnea. Maintaining NSR by examination today.  - Continue Coreg  3.125mg  BID for rate-control.   6. AAA - 5 cm by CT in 05/2017.  - plan for annual imaging.   7. Stage 3 CKD - baseline creatinine 1.3 - 1.4. At 1.68 on most recent check.     Medication Adjustments/Labs and Tests Ordered: Current medicines are reviewed at length with the  patient today.  Concerns regarding medicines are outlined above.  Medication changes, Labs and Tests ordered today are listed in the Patient Instructions below. Patient Instructions  Medication Instructions:  STOP Metolazone  PLEASE take Lasix 80mg  in the mornings and 40mg  in the evenings    Labwork: None   Testing/Procedures: None   Follow-Up: Keep Upcoming appointment with Dr Royann Shiversroitoru as scheduled  Any Other Special Instructions Will Be Listed Below (If Applicable).  If you need a refill on your cardiac medications before your next appointment, please call your pharmacy.    Signed, Ellsworth LennoxBrittany M Strader, PA-C  11/19/2017 2:26 PM    [facility] [address], [address] [place], [place]  [address] Phone: [phone]; Fax: [phone]  [address], [address] [place], [place] [address] Phone: [phone]

## 2017-11-19 ENCOUNTER — Encounter: Payer: Self-pay | Admitting: Student

## 2017-11-19 ENCOUNTER — Ambulatory Visit: Payer: Medicare Other | Admitting: Student

## 2017-11-19 VITALS — BP 106/56 | HR 90 | Ht 73.0 in | Wt 188.0 lb

## 2017-11-19 DIAGNOSIS — I480 Paroxysmal atrial fibrillation: Secondary | ICD-10-CM | POA: Diagnosis not present

## 2017-11-19 DIAGNOSIS — N183 Chronic kidney disease, stage 3 unspecified: Secondary | ICD-10-CM

## 2017-11-19 DIAGNOSIS — I5042 Chronic combined systolic (congestive) and diastolic (congestive) heart failure: Secondary | ICD-10-CM | POA: Diagnosis not present

## 2017-11-19 DIAGNOSIS — I25810 Atherosclerosis of coronary artery bypass graft(s) without angina pectoris: Secondary | ICD-10-CM

## 2017-11-19 DIAGNOSIS — Z953 Presence of xenogenic heart valve: Secondary | ICD-10-CM

## 2017-11-19 DIAGNOSIS — Z95810 Presence of automatic (implantable) cardiac defibrillator: Secondary | ICD-10-CM

## 2017-11-19 DIAGNOSIS — I714 Abdominal aortic aneurysm, without rupture, unspecified: Secondary | ICD-10-CM

## 2017-11-19 NOTE — Progress Notes (Signed)
You realize you have to take him with you to [place], right? MCr

## 2017-11-19 NOTE — Patient Instructions (Addendum)
Medication Instructions:  STOP Metolazone  PLEASE take Lasix 80mg  in the mornings and 40mg  in the evenings    Labwork: None   Testing/Procedures: None   Follow-Up: Keep Upcoming appointment with Dr Royann Shiversroitoru as scheduled  Any Other Special Instructions Will Be Listed Below (If Applicable).  If you need a refill on your cardiac medications before your next appointment, please call your pharmacy.

## 2018-01-19 ENCOUNTER — Other Ambulatory Visit: Payer: Self-pay | Admitting: Student

## 2018-01-19 ENCOUNTER — Telehealth: Payer: Self-pay | Admitting: Cardiovascular Disease

## 2018-01-19 MED ORDER — CARVEDILOL 3.125 MG PO TABS: 3.1250 mg | ORAL_TABLET | Freq: Two times a day (BID) | ORAL | 2 refills | Status: DC

## 2018-01-19 NOTE — Telephone Encounter (Signed)
[facility] is calling to see if a new prescription can be sent in for Carvedilol to have Quantity of 60 because he takes 1 pill twice a day .  Please call if you have any questions . Thanks

## 2018-01-19 NOTE — Telephone Encounter (Signed)
Phone  Request for coreg 3.125 mg twice a day 60 tablet quantity with 2 refills , patient has an appoinmtent schedule 01/25/18

## 2018-01-19 NOTE — Telephone Encounter (Signed)
Patient is a [facility] patient 

## 2018-01-25 ENCOUNTER — Ambulatory Visit: Payer: Medicare Other | Admitting: Cardiovascular Disease

## 2018-01-25 ENCOUNTER — Encounter: Payer: Self-pay | Admitting: Cardiovascular Disease

## 2018-01-25 VITALS — BP 140/80 | HR 70 | Ht 73.0 in | Wt 197.0 lb

## 2018-01-25 DIAGNOSIS — I2510 Atherosclerotic heart disease of native coronary artery without angina pectoris: Secondary | ICD-10-CM | POA: Diagnosis not present

## 2018-01-25 DIAGNOSIS — I480 Paroxysmal atrial fibrillation: Secondary | ICD-10-CM

## 2018-01-25 DIAGNOSIS — I5043 Acute on chronic combined systolic (congestive) and diastolic (congestive) heart failure: Secondary | ICD-10-CM | POA: Diagnosis not present

## 2018-01-25 DIAGNOSIS — Z952 Presence of prosthetic heart valve: Secondary | ICD-10-CM

## 2018-01-25 DIAGNOSIS — E7800 Pure hypercholesterolemia, unspecified: Secondary | ICD-10-CM

## 2018-01-25 DIAGNOSIS — I7121 Aneurysm of the ascending aorta, without rupture: Secondary | ICD-10-CM

## 2018-01-25 DIAGNOSIS — I671 Cerebral aneurysm, nonruptured: Secondary | ICD-10-CM

## 2018-01-25 DIAGNOSIS — Z95810 Presence of automatic (implantable) cardiac defibrillator: Secondary | ICD-10-CM

## 2018-01-25 DIAGNOSIS — Z79899 Other long term (current) drug therapy: Secondary | ICD-10-CM

## 2018-01-25 DIAGNOSIS — I712 Thoracic aortic aneurysm, without rupture: Secondary | ICD-10-CM

## 2018-01-25 DIAGNOSIS — N183 Chronic kidney disease, stage 3 unspecified: Secondary | ICD-10-CM

## 2018-01-25 DIAGNOSIS — M10442 Other secondary gout, left hand: Secondary | ICD-10-CM | POA: Diagnosis not present

## 2018-01-25 DIAGNOSIS — Z125 Encounter for screening for malignant neoplasm of prostate: Secondary | ICD-10-CM

## 2018-01-25 DIAGNOSIS — R300 Dysuria: Secondary | ICD-10-CM

## 2018-01-25 DIAGNOSIS — Z5181 Encounter for therapeutic drug level monitoring: Secondary | ICD-10-CM | POA: Insufficient documentation

## 2018-01-25 DIAGNOSIS — I10 Essential (primary) hypertension: Secondary | ICD-10-CM

## 2018-01-25 MED ORDER — METOLAZONE 2.5 MG PO TABS: 2.5000 mg | ORAL_TABLET | Freq: Every day | ORAL | 3 refills | Status: DC

## 2018-01-25 MED ORDER — POTASSIUM CHLORIDE CRYS ER 20 MEQ PO TBCR: 20.0000 meq | EXTENDED_RELEASE_TABLET | Freq: Three times a day (TID) | ORAL | 3 refills | Status: DC

## 2018-01-25 MED ORDER — FUROSEMIDE 40 MG PO TABS: 80.0000 mg | ORAL_TABLET | Freq: Two times a day (BID) | ORAL | 3 refills | Status: DC

## 2018-01-25 NOTE — Progress Notes (Signed)
Cardiology Office Note    Date:  01/25/2018   ID:  PERSEUS WESTALL, DOB 01/02/1941, MRN [id number]  PCP:  [place]  Cardiologist:   Thurmon Fair, MD   Chief Complaint  Patient presents with  . Dyspnea with mild exertion    History of Present Illness:  [name] is a 77 y.o. male with a long-standing history of CAD with redo CABG 2004, ischemic cardiomyopathy, CRT-D, systolic heart failure, bilateral carotid stenoses and endarterectomy, returning for follow-up.  He complains of shortness of breath walking short does not have frank orthopnea.  He has gained 9 pounds since his previous office appointment in late November and is as much as 17 pounds above what we had previously established at his "dry weight".  He has tried taking a higher dose of furosemide: He was taking 80 mg in the morning 40 mg in the afternoon (we actually had documented this as being his maintenance dose but he was only taking 40 mg twice daily in fact).  Yesterday he also tried taking his "booster pill" (probably metolazone 2.5 mg daily) but this has not helped.  He does not have chest pain or palpitations.  His electrocardiogram shows him to be in atrial paced, biventricular paced rhythm with positive R waves in lead V1 consistent with effective left ventricular capture.  He continues to drink alcohol and larger than recommended amounts.  He has been today to refer him to a "cancer doctor".  He is concerned that he has either colon cancer or prostate cancer.  It appears that his actual concerns are persistent constipation and difficulty with urination.  He has a [employer] protecta CRT-D device implanted in 2013 Device interrogation shows normal function.  We were unable to check his device today due to equipment problems.  His last in office pacemaker check was in April 2018 and he may be approaching ERI.  He has not been compliant with a remote downloads because "his transmitter is not working".  We  will plan to bring him back in a few days for a comprehensive pacemaker check.  His last echo in November 2018 showed EF 30-35%, normal function of the prosthetic valve, evidence of elevated filling pressures at the time.  The ascending aorta was measured as 5.1 cm, comparable to the CT of the chest performed in June 2018.  Past Medical History:  Diagnosis Date  . Anginal pain (HCC)   . Anxiety   . Aortic stenosis    a. s/p bioprosthetic AVR in 2010  . Arthritis    gout  . CAD (coronary artery disease)    a. s/p CABG in 1992 b. redo CABG in 2004 c. cath in 2010 showing patent LIMA-LAD, free RIMA-OM, and SVG-OM, and 80% stenosis of small PLA branch  . Chest pain   . CHF (congestive heart failure) (HCC)   . Dysrhythmia   . GERD (gastroesophageal reflux disease)    with gas and bloating probably cause of chest pain  . H/O atrial flutter    a. s/p ablation and not on anticoagulation secondary to GIB and alcohol abuse.   . H/O: gout   . Hyperlipidemia   . Hypertension   . 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 Ischemic cardiomyopathy    a. EF previously 20-25%, improved to 40-45% by echo in 03/2013 b. 10/2017: echo showing of 30-35% with Grade 2 DD, normal function of AVR, moderate MR.   . Myocardial infarction (  HCC)   . Pacemaker   . 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 Shortness of breath     Past Surgical History:  Procedure Laterality Date  . BI-VENTRICULAR IMPLANTABLE CARDIOVERTER DEFIBRILLATOR Right 08/18/2012   Procedure: BI-VENTRICULAR IMPLANTABLE CARDIOVERTER DEFIBRILLATOR  (CRT-D);  Surgeon: Marinus Maw, MD;  Location: [facility] CATH LAB;  Service: Cardiovascular;  Laterality: Right;  . CARDIAC CATHETERIZATION  Dec 18, 2003   with a presentation of atrial flutter with rapid ventricular response. He had a cath and two stents to his PDA after his SVG to his RCA. His other graft were patent. He  had LV with EF of 30%.   . CAROTID ENDARTERECTOMY     left in December 2003 and know renal artery stenosis of 60-70%  . CORONARY ARTERY BYPASS GRAFT  1993   redo in 2000, he has had multiple MI previous to both procedures.  . IMPLANTABLE CARDIOVERTER DEFIBRILLATOR (ICD) GENERATOR CHANGE N/A 06/25/2012   Procedure: ICD GENERATOR CHANGE;  Surgeon: Marinus Maw, MD;  Location: [facility] CATH LAB;  Service: Cardiovascular;  Laterality: N/A;  . VALVE REPLACEMENT  2010    Current Medications: Outpatient Medications Prior to Visit  Medication Sig Dispense Refill  . amiodarone (PACERONE) 200 MG tablet Take 1 tablet (200 mg total) by mouth daily. 30 tablet 11  . aspirin EC 81 MG tablet Take 81 mg by mouth daily.     . carvedilol (COREG) 3.125 MG tablet Take 1 tablet (3.125 mg total) by mouth 2 (two) times daily. Keep appointment 01/25/18 60 tablet 2  . colchicine 0.6 MG tablet Take 1 tablet (0.6 mg total) by mouth as needed (gout attacks). 30 tablet 3  . nitroGLYCERIN (NITROSTAT) 0.4 MG SL tablet PUT1TAB UNDER TONGUE EVERY AS NEEDED FOR CHESTPAIN, UP TO 3DOSES. IF NOT RELIEVED, CALL EMS/MD 25 tablet 0  . vitamin B-12 (CYANOCOBALAMIN) 1000 MCG tablet Take 1 tablet (1,000 mcg total) by mouth daily.    . furosemide (LASIX) 40 MG tablet Take 2 tablets (80 mg total) by mouth every morning and take 1 tablet (40 mg total) by mouth every evening. 120 tablet 3  . potassium chloride SA (KLOR-CON M20) 20 MEQ tablet Take 1 tablet (20 mEq total) by mouth 2 (two) times daily. 75 tablet 11  . atorvastatin (LIPITOR) 20 MG tablet TAKE 1 TABLET (20 MG TOTAL) BY MOUTH DAILY. 30 tablet 9   No facility-administered medications prior to visit.      Allergies:   Atorvastatin   Social History   Socioeconomic History  . Marital status: Married    Spouse name: None  . Number of children: None  . Years of education: None  . Highest education level: None  Social Needs  . Financial resource strain: None  . Food  insecurity - worry: None  . Food insecurity - inability: None  . Transportation needs - medical: None  . Transportation needs - non-medical: None  Occupational History  . None  Tobacco Use  . Smoking status: Never Smoker  . Smokeless tobacco: Current User    Types: Chew  Substance and Sexual Activity  . Alcohol use: Yes    Alcohol/week: 8.4 oz    Types: 14 Cans of beer per week  . Drug use: No  . Sexual activity: None  Other Topics Concern  . None  Social History Narrative  . None       ROS:   Please see the history  of present illness.    ROS All other systems reviewed and are negative.   PHYSICAL EXAM:   VS:  BP 140/80   Pulse 70   Ht 6\' 1"  (1.854 m)   Wt 197 lb (89.4 kg)   BMI 25.99 kg/m     General: Alert, oriented x3, no distress, appears comfortable sitting up Head: no evidence of trauma, PERRL, EOMI, no exophtalmos or lid lag, no myxedema, no xanthelasma; normal ears, nose and oropharynx Neck: 10 cm elevation in jugular venous pulsations and prompt hepatojugular reflux; brisk carotid pulses without delay and no carotid bruits Chest: clear to auscultation, no signs of consolidation by percussion or palpation, normal fremitus, symmetrical and full respiratory excursions Cardiovascular: normal position and quality of the apical impulse, regular rhythm, normal first and widely split second heart sounds, no murmurs, rubs or gallops Abdomen: no tenderness or distention, no masses by palpation, no abnormal pulsatility or arterial bruits, normal bowel sounds, no hepatosplenomegaly Extremities: no clubbing, cyanosis; 1+ symmetrical ankle edema; 2+ radial, ulnar and brachial pulses bilaterally; 2+ right femoral, posterior tibial and dorsalis pedis pulses; 2+ left femoral, posterior tibial and dorsalis pedis pulses; no subclavian or femoral bruits Neurological: grossly nonfocal Psych: Normal mood and affect   Wt Readings from Last 3 Encounters:  01/25/18 197 lb (89.4 kg)    11/19/17 188 lb (85.3 kg)  10/27/17 192 lb 6.4 oz (87.3 kg)      Studies/Labs Reviewed:   EKG:  EKG is ordered today.  It shows atrial sensed, ventricular paced rhythm. Prominent R waves in lead V1 and V2 are consistent with effective left ventricular pacing, although the QRS is quite broad at 198 ms. QTC 557 ms  Recent Labs: 02/06/2017: Magnesium 1.8 06/05/2017: B Natriuretic Peptide 628.5; Hemoglobin 9.6; Platelets 287 10/27/2017: BUN 25; Creatinine, Ser 1.68; Potassium 3.7; Sodium 140   Lipid Panel    Component Value Date/Time   CHOL 158 12/19/2016 0841   TRIG 74 12/19/2016 0841   HDL 38 (L) 12/19/2016 0841   CHOLHDL 4.2 12/19/2016 0841   VLDL 15 12/19/2016 0841   LDLCALC 105 (H) 12/19/2016 0841   ECHO  11/04/2017 - Left ventricle: The cavity size was mildly dilated. Wall thickness was increased in a pattern of mild LVH. Hypokinesis of the apex. Basal inferior akinesis. Inferolateral and   anterolateral severe hypokinesis. Systolic function was moderately to severely reduced. The estimated ejection fraction was in the range of 30% to 35%. Features are consistent with a pseudonormal left ventricular filling pattern, with concomitant   abnormal relaxation and increased filling pressure (grade 2 diastolic dysfunction). E/medial e&' > 15, suggesting LV end diastolic pressure at least 20 mmHg. - Aortic valve: Bioprosthetic aortic valve. No significant bioprosthetic valve stenosis. There was no regurgitation.  Aorta: Severe dilation of ascending aorta. Ascending aortic diameter: 51 mm (S). - Mitral valve: Mildly calcified annulus. Mildly calcified leaflets. There was moderate, posteriorly directed regurgitation. - Left atrium: The atrium was moderately dilated. - Right ventricle: The cavity size was mildly dilated. Pacer wire or catheter noted in right ventricle. Systolic function was moderately reduced. - Right atrium: The atrium was moderately dilated. - Tricuspid valve: Peak RV-RA  gradient (S): 29 mm Hg. - Pulmonary arteries: PA peak pressure: 44 mm Hg (S). - Systemic veins: IVC measured 2.7 cm with < 50% respirophasic variation,  suggesting RA pressure 15 mmHg.  Impressions:   ASSESSMENT:    1. Acute on chronic combined systolic and diastolic heart failure (HCC)  2. Screening PSA (prostate specific antigen)   3. Coronary artery disease involving native coronary artery of native heart without angina pectoris   4. 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 Hypercholesterolemia   6. Essential hypertension   7. Paroxysmal atrial fibrillation (HCC)   8. Encounter for monitoring amiodarone therapy   9. H/O aortic valve replacement   10. Ascending aortic aneurysm (HCC)   11. Stage 3 chronic kidney disease (HCC)   12. Other secondary acute gout of left hand   13. 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 CHF: He has physical findings of hypervolemia and NYHA functional class II-III exertional dyspnea.  We will increase his furosemide to 80 mg twice daily with metolazone 2.5 mg administered 30 minutes before the morning dose of furosemide.  Increase potassium supplement 2. CAD s/p redo CABG: He does not have angina pectoris and is taking a statin. (cath in 2010 showing patent LIMA-LAD, free RIMA-OM, and SVG-OM, and 80% stenosis of small PLA branch). 3. CRT-D: His device is probably approaching ERI, but judging by his ECG is still functioning normally.  Unable to check it today.  Plan to recheck it later this week. 4. HLP: Target LDL 70.  He did not have his repeat labs as ordered, plan to recheck them later this week 5. HTN: His blood pressure is slightly too high but likely to improve with diuresis.  He has been resistant to initiating more medications for heart failure or blood pressure.  Spironolactone might be a good choice if he agrees. 6. Carotid disease s/p  bilateral carotid endarterectomies. No recent neurological complaints.  7. AFib: Continued alcohol use and poor compliance and previous GI bleeding make him a poor candidate for anticoagulation.  His device has not shown any atrial fibrillation in the recent past.  He is in atrial paced rhythm today. Has had ablation. 8. Amiodarone: Recheck liver function tests and TSH this week. 9. S/P AVR: November 2018 echo shows normal prosthetic valve function. 10. Ascending aortic aneurysm: Most recently measured at 5.1 cm by echo, 5.0 cm by CT angiogram of the chest in June 2018.  Consider referral to cardiac surgery, but he is not a great candidate for redo thoracotomy. 11. CKD 3: monitor labs with diuresis 12. Gout: Watch for exacerbation when he receives diuretics in higher doses, especially with thiazides. 13. Dysuria: Check PSA.  Consider referral to urology and gastroenterology at his request, after we improve his heart failure compensation 14. Anterior communicating artery aneurysm: 4.18mm aneurysm discovered incidentally at the time of preoperative carotid angiography. Asymptomatic.    Medication Adjustments/Labs and Tests Ordered: Current medicines are reviewed at length with the patient today.  Concerns regarding medicines are outlined above.  Medication changes, Labs and Tests ordered today are listed in the Patient Instructions below. Patient Instructions  Dr Royann Shivers has recommended making the following medication changes: 1. INCREASE Furosemide (Lasix) to 80 mg TWICE daily 2. START Metolazone 2.5 mg - take 1 tablet 30 minutes before furosemide (Lasix) DAILY 3. INCREASE Potassium to 20 mEq THREE times daily  Your physician recommends that you return for lab work ON THURSDAY.  Dr Royann Shivers recommends that you schedule a follow-up appointment with him ON FRIDAY.  If you need a refill on your cardiac medications before your next appointment, please call your pharmacy.    Signed, Thurmon Fair, MD  01/25/2018 1:48 PM  [facility] [address], [place], [place]  [address] Phone: [phone]; Fax: [phone]

## 2018-01-25 NOTE — Patient Instructions (Signed)
Dr Royann Shiversroitoru has recommended making the following medication changes: 1. INCREASE Furosemide (Lasix) to 80 mg TWICE daily 2. START Metolazone 2.5 mg - take 1 tablet 30 minutes before furosemide (Lasix) DAILY 3. INCREASE Potassium to 20 mEq THREE times daily  Your physician recommends that you return for lab work ON THURSDAY.  Dr Royann Shiversroitoru recommends that you schedule a follow-up appointment with him ON FRIDAY.  If you need a refill on your cardiac medications before your next appointment, please call your pharmacy.

## 2018-01-26 ENCOUNTER — Telehealth: Payer: Self-pay

## 2018-01-26 DIAGNOSIS — E785 Hyperlipidemia, unspecified: Secondary | ICD-10-CM

## 2018-01-26 DIAGNOSIS — I5042 Chronic combined systolic (congestive) and diastolic (congestive) heart failure: Secondary | ICD-10-CM

## 2018-01-26 DIAGNOSIS — I480 Paroxysmal atrial fibrillation: Secondary | ICD-10-CM

## 2018-01-26 NOTE — Telephone Encounter (Signed)
Labs ordered. Placed in accordion folder for lab [employer] consultanttechnician.

## 2018-01-26 NOTE — Telephone Encounter (Signed)
-----   Message from Thurmon FairMihai Croitoru, MD sent at 01/25/2018  1:34 PM EST ----- Please add a fasting lipid profile, TSH and CMET to the labs planned for later this week

## 2018-01-28 LAB — BASIC METABOLIC PANEL
BUN / CREAT RATIO: 15 (ref 10–24)
BUN: 27 mg/dL (ref 8–27)
CHLORIDE: 91 mmol/L — AB (ref 96–106)
CO2: 29 mmol/L (ref 20–29)
Calcium: 9.8 mg/dL (ref 8.6–10.2)
Creatinine, Ser: 1.86 mg/dL — ABNORMAL HIGH (ref 0.76–1.27)
GFR calc Af Amer: 40 mL/min/{1.73_m2} — ABNORMAL LOW (ref >59)
GFR calc non Af Amer: 35 mL/min/{1.73_m2} — ABNORMAL LOW (ref >59)
GLUCOSE: 91 mg/dL (ref 65–99)
POTASSIUM: 3.1 mmol/L — AB (ref 3.5–5.2)
SODIUM: 138 mmol/L (ref 134–144)

## 2018-01-28 LAB — PRO B NATRIURETIC PEPTIDE: NT-Pro BNP: 3838 pg/mL — ABNORMAL HIGH (ref 0–486)

## 2018-01-28 LAB — PSA: PROSTATE SPECIFIC AG, SERUM: 0.8 ng/mL (ref 0.0–4.0)

## 2018-01-29 ENCOUNTER — Encounter: Payer: Self-pay | Admitting: Cardiovascular Disease

## 2018-01-29 ENCOUNTER — Ambulatory Visit: Payer: Medicare Other | Admitting: Cardiovascular Disease

## 2018-01-29 VITALS — BP 126/70 | HR 70 | Ht 73.0 in | Wt 188.0 lb

## 2018-01-29 DIAGNOSIS — I10 Essential (primary) hypertension: Secondary | ICD-10-CM | POA: Diagnosis not present

## 2018-01-29 DIAGNOSIS — I5043 Acute on chronic combined systolic (congestive) and diastolic (congestive) heart failure: Secondary | ICD-10-CM | POA: Diagnosis not present

## 2018-01-29 DIAGNOSIS — I480 Paroxysmal atrial fibrillation: Secondary | ICD-10-CM

## 2018-01-29 DIAGNOSIS — Z95810 Presence of automatic (implantable) cardiac defibrillator: Secondary | ICD-10-CM

## 2018-01-29 DIAGNOSIS — I712 Thoracic aortic aneurysm, without rupture: Secondary | ICD-10-CM

## 2018-01-29 DIAGNOSIS — I25810 Atherosclerosis of coronary artery bypass graft(s) without angina pectoris: Secondary | ICD-10-CM

## 2018-01-29 DIAGNOSIS — Z79899 Other long term (current) drug therapy: Secondary | ICD-10-CM | POA: Diagnosis not present

## 2018-01-29 DIAGNOSIS — Z5181 Encounter for therapeutic drug level monitoring: Secondary | ICD-10-CM

## 2018-01-29 DIAGNOSIS — I7121 Aneurysm of the ascending aorta, without rupture: Secondary | ICD-10-CM

## 2018-01-29 DIAGNOSIS — Z952 Presence of prosthetic heart valve: Secondary | ICD-10-CM | POA: Diagnosis not present

## 2018-01-29 DIAGNOSIS — E7800 Pure hypercholesterolemia, unspecified: Secondary | ICD-10-CM

## 2018-01-29 DIAGNOSIS — Z9889 Other specified postprocedural states: Secondary | ICD-10-CM

## 2018-01-29 DIAGNOSIS — N183 Chronic kidney disease, stage 3 unspecified: Secondary | ICD-10-CM

## 2018-01-29 NOTE — Progress Notes (Signed)
Cardiology Office Note    Date:  01/29/2018   ID:  [name], DOB 01/18/1941, MRN [id number]  PCP:  [place]  Cardiologist:   Thurmon Fair, MD   Chief Complaint  Patient presents with  . Dyspnea with mild exertion    History of Present Illness:  [name] is a 77 y.o. male with a long-standing history of CAD with redo CABG 2004, ischemic cardiomyopathy, CRT-D, systolic heart failure, bilateral carotid stenoses and endarterectomy, returning for follow-up.  He presents in follow-up after increasing diuretic therapy for shortness of breath and weight gain over year this week.  He has lost 9 pounds in weight and is back down under what is probably adequate "dry weight" of 190 pounds.  He no longer has orthopnea or cough.  He does complain of constipation, dry mouth and occasional orthostatic dizziness.  He has not had any chest pain, palpitation or leg edema.  He denies syncope.  Repeat labs show that his potassium is low at 3.1 and creatinine has increased to roughly 1.9 from baseline of 1.3.  He continues to drink alcohol in larger than recommended amounts.  He has a Medtronic protecta CRT-D device implanted in 2013.   His last echo in November 2018 showed EF 30-35%, normal function of the prosthetic valve, evidence of elevated filling pressures at the time.  The ascending aorta was measured as 5.1 cm, comparable to the CT of the chest performed in June 2018.  Past Medical History:  Diagnosis Date  . Anginal pain (HCC)   . Anxiety   . Aortic stenosis    a. s/p bioprosthetic AVR in 2010  . Arthritis    gout  . CAD (coronary artery disease)    a. s/p CABG in 1992 b. redo CABG in 2004 c. cath in 2010 showing patent LIMA-LAD, free RIMA-OM, and SVG-OM, and 80% stenosis of small PLA branch  . Chest pain   . CHF (congestive heart failure) (HCC)   . Dysrhythmia   . GERD (gastroesophageal reflux disease)    with gas and bloating probably cause of chest pain  .  H/O atrial flutter    a. s/p ablation and not on anticoagulation secondary to GIB and alcohol abuse.   . H/O: gout   . Hyperlipidemia   . Hypertension   . 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 Ischemic cardiomyopathy    a. EF previously 20-25%, improved to 40-45% by echo in 03/2013 b. 10/2017: echo showing of 30-35% with Grade 2 DD, normal function of AVR, moderate MR.   . Myocardial infarction (HCC)   . Pacemaker   . 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 Shortness of breath     Past Surgical History:  Procedure Laterality Date  . BI-VENTRICULAR IMPLANTABLE CARDIOVERTER DEFIBRILLATOR Right 08/18/2012   Procedure: BI-VENTRICULAR IMPLANTABLE CARDIOVERTER DEFIBRILLATOR  (CRT-D);  Surgeon: Marinus Maw, MD;  Location: [facility] CATH LAB;  Service: Cardiovascular;  Laterality: Right;  . CARDIAC CATHETERIZATION  Dec 18, 2003   with a presentation of atrial flutter with rapid ventricular response. He had a cath and two stents to his PDA after his SVG to his RCA. His other graft were patent. He had LV with EF of 30%.   . CAROTID ENDARTERECTOMY     left in December 2003 and know renal artery stenosis of 60-70%  . CORONARY ARTERY BYPASS GRAFT  1993  redo in 2000, he has had multiple MI previous to both procedures.  . IMPLANTABLE CARDIOVERTER DEFIBRILLATOR (ICD) GENERATOR CHANGE N/A 06/25/2012   Procedure: ICD GENERATOR CHANGE;  Surgeon: Marinus MawGregg W Taylor, MD;  [facility];  Service: Cardiovascular;  Laterality: N/A;  . VALVE REPLACEMENT  2010    Current Medications: Outpatient Medications Prior to Visit  Medication Sig Dispense Refill  . amiodarone (PACERONE) 200 MG tablet Take 1 tablet (200 mg total) by mouth daily. 30 tablet 11  . aspirin EC 81 MG tablet Take 81 mg by mouth daily.     [name]. atorvastatin (LIPITOR) 20 MG tablet TAKE 1 TABLET (20 MG TOTAL) BY MOUTH DAILY. 30 tablet 9  . carvedilol (COREG) 3.125  MG tablet Take 1 tablet (3.125 mg total) by mouth 2 (two) times daily. Keep appointment 01/25/18 60 tablet 2  . colchicine 0.6 MG tablet Take 1 tablet (0.6 mg total) by mouth as needed (gout attacks). 30 tablet 3  . furosemide (LASIX) 40 MG tablet Take 2 tablets (80 mg total) by mouth 2 (two) times daily. 360 tablet 3  . metolazone (ZAROXOLYN) 2.5 MG tablet Take 1 tablet (2.5 mg total) by mouth daily. Take 30 minutes before furosemide (Lasix). 90 tablet 3  . nitroGLYCERIN (NITROSTAT) 0.4 MG SL tablet PUT1TAB UNDER TONGUE EVERY 5MINS AS NEEDED FOR CHESTPAIN, UP TO 3DOSES. IF NOT RELIEVED, CALL EMS/MD 25 tablet 0  . potassium chloride SA (KLOR-CON M20) 20 MEQ tablet Take 1 tablet (20 mEq total) by mouth 3 (three) times daily. 270 tablet 3  . vitamin B-12 (CYANOCOBALAMIN) 1000 MCG tablet Take 1 tablet (1,000 mcg total) by mouth daily.     No facility-administered medications prior to visit.      Allergies:   Atorvastatin   Social History   Socioeconomic History  . Marital status: Married    Spouse name: None  . Number of children: None  . Years of education: None  . Highest education level: None  Social Needs  . Financial resource strain: None  . Food insecurity - worry: None  . Food insecurity - inability: None  . Transportation needs - medical: None  . Transportation needs - non-medical: None  Occupational History  . None  Tobacco Use  . Smoking status: Never Smoker  . Smokeless tobacco: Current User    Types: Chew  Substance and Sexual Activity  . Alcohol use: Yes    Alcohol/week: 8.4 oz    Types: 14 Cans of beer per week  . Drug use: No  . Sexual activity: None  Other Topics Concern  . None  Social History Narrative  . None       ROS:   Please see the history of present illness.    ROS All other systems reviewed and are negative.   PHYSICAL EXAM:   VS:  BP 126/70   Pulse 70   Ht 6\' 1"  (1.854 m)   Wt 188 lb (85.3 kg)   BMI 24.80 kg/m    General: Alert,  oriented x3, no distress, appears comfortable sitting up Head: no evidence of trauma, PERRL, EOMI, no exophtalmos or lid lag, no myxedema, no xanthelasma; normal ears, nose and oropharynx Neck: 10 cm elevation in jugular venous pulsations and prompt hepatojugular reflux; brisk carotid pulses without delay and no carotid bruits Chest: clear to auscultation, no signs of consolidation by percussion or palpation, normal fremitus, symmetrical and full respiratory excursions Cardiovascular: normal position and quality of the apical impulse, regular rhythm, normal  first and widely split second heart sounds, no murmurs, rubs or gallops Abdomen: no tenderness or distention, no masses by palpation, no abnormal pulsatility or arterial bruits, normal bowel sounds, no hepatosplenomegaly Extremities: no clubbing, cyanosis; 1+ symmetrical ankle edema; 2+ radial, ulnar and brachial pulses bilaterally; 2+ right femoral, posterior tibial and dorsalis pedis pulses; 2+ left femoral, posterior tibial and dorsalis pedis pulses; no subclavian or femoral bruits Neurological: grossly nonfocal Psych: Normal mood and affect   Wt Readings from Last 3 Encounters:  01/29/18 188 lb (85.3 kg)  01/25/18 197 lb (89.4 kg)  11/19/17 188 lb (85.3 kg)      Studies/Labs Reviewed:   EKG:  EKG is ordered today.  It shows atrial sensed, ventricular paced rhythm. Prominent R waves in lead V1 and V2 are consistent with effective left ventricular pacing, although the QRS is quite broad at 198 ms. QTC 557 ms  Recent Labs: 02/06/2017: Magnesium 1.8 06/05/2017: B Natriuretic Peptide 628.5; Hemoglobin 9.6; Platelets 287 01/28/2018: BUN 27; Creatinine, Ser 1.86; NT-Pro BNP 3,838; Potassium 3.1; Sodium 138   Lipid Panel    Component Value Date/Time   CHOL 158 12/19/2016 0841   TRIG 74 12/19/2016 0841   HDL 38 (L) 12/19/2016 0841   CHOLHDL 4.2 12/19/2016 0841   VLDL 15 12/19/2016 0841   LDLCALC 105 (H) 12/19/2016 0841   ECHO    11/04/2017 - Left ventricle: The cavity size was mildly dilated. Wall thickness was increased in a pattern of mild LVH. Hypokinesis of the apex. Basal inferior akinesis. Inferolateral and   anterolateral severe hypokinesis. Systolic function was moderately to severely reduced. The estimated ejection fraction was in the range of 30% to 35%. Features are consistent with a pseudonormal left ventricular filling pattern, with concomitant   abnormal relaxation and increased filling pressure (grade 2 diastolic dysfunction). E/medial e&' > 15, suggesting LV end diastolic pressure at least 20 mmHg. - Aortic valve: Bioprosthetic aortic valve. No significant bioprosthetic valve stenosis. There was no regurgitation.  Aorta: Severe dilation of ascending aorta. Ascending aortic diameter: 51 mm (S). - Mitral valve: Mildly calcified annulus. Mildly calcified leaflets. There was moderate, posteriorly directed regurgitation. - Left atrium: The atrium was moderately dilated. - Right ventricle: The cavity size was mildly dilated. Pacer wire or catheter noted in right ventricle. Systolic function was moderately reduced. - Right atrium: The atrium was moderately dilated. - Tricuspid valve: Peak RV-RA gradient (S): 29 mm Hg. - Pulmonary arteries: PA peak pressure: 44 mm Hg (S). - Systemic veins: IVC measured 2.7 cm with < 50% respirophasic variation,  suggesting RA pressure 15 mmHg.  Impressions:   ASSESSMENT:    1. Acute on chronic combined systolic and diastolic CHF (congestive heart failure) (HCC)   2. Coronary artery disease involving autologous vein coronary bypass graft without angina pectoris   3. CRT-D Medtronic   4. Pure hypercholesterolemia   5. Essential hypertension   6. History of bilateral carotid endarterectomy   7. PAF (paroxysmal atrial fibrillation) (HCC)   8. Encounter for monitoring amiodarone therapy   9. H/O aortic valve replacement   10. Ascending aortic aneurysm (HCC)   11. Stage 3  chronic kidney disease (HCC)      PLAN:  In order of problems listed above:  1. CHF: Clinically improved volume status.  Back to what is likely his "dry weight".  We will give him a table prescription to use metolazone if his weight exceeds 190 pounds.  Whenever he takes metolazone he will also increase his  potassium chloride supplement.  He should never take the metolazone more than 3 days a week and if he does take metolazone he should call us to see if he needs to have updated lab work.  He is currently mildly hypokalemic and his renal function is above baseline, will bring him back in 2 weeks for repeat labs. 2. CAD s/p redo CABG: He does not have angina pectoris and is taking a statin. (cath in 2010 showing patent LIMA-LAD, free RIMA-OM, and SVG-OM, and 80% stenosis of small PLA branch). 3. CRT-D: We were again unable to check his device today.  We will bring him to the device clinic in a couple of weeks for a comprehensive device check. 4. HLP: Lipids were not checked as we had planned with his recent lab work, will check those when he updates his metabolic panel in 2 weeks. 5. HTN: Blood pressure has improved with diuresis. 6. Carotid disease s/p bilateral carotid endarterectomies. No recent neurological complaints.  7. AFib: Continued alcohol use and poor compliance and previous GI bleeding make him a poor candidate for anticoagulation.  His device has not shown any atrial fibrillation in the recent past.  He is in regular rhythm today. 8. Amiodarone: Recheck liver function tests and TSH with upcoming labs in 2 weeks.. 9. S/P AVR: November 2018 echo shows normal bioprosthetic valve function. 10. Ascending aortic aneurysm: Most recently measured at 5.1 cm by echo, 5.0 cm by CT angiogram of the chest in June 2018.  Consider referral to cardiac surgery, but he is not a great candidate for redo thoracotomy. 11. CKD 3: As expected, renal function has shown some deterioration with diuresis.   Recheck in 2 weeks 12. Gout: Despite taking thiazide diuretic and having worsening renal function he has not had a gout exacerbation this time. 13. Dysuria: PSA is normal. 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 Asymptomatic.    Medication Adjustments/Labs and Tests Ordered: Current medicines are reviewed at length with the patient today.  Concerns regarding medicines are outlined above.  Medication changes, Labs and Tests ordered today are listed in the Patient Instructions below. Patient Instructions  Medication Instructions: Dr Royann Shivers has recommended making the following medication changes: 1. TAKE Metolazone (fluid booster) ONLY with you weigh more than 190 pounds and NEVER more than 3 times a week 2. CONTINUE Potassium 20 mEq 3 times a day, TAKE an extra tablet on the days you have to take Metolazone  Labwork: Your physician recommends that you return for lab work in 2-3 weeks - FASTING. Nothing to eat or drink after midnight. You may go to the [facility] in [place] across the street from [facility].  Testing/Procedures: NONE ORDERED  Follow-up: Your physician recommends that you schedule a follow-up appointment in 2-3 months with Randall An, PA in our [place] office.  Dr Royann Shivers recommends that you schedule a follow-up appointment in 6 months. You will receive a reminder letter in the mail two months in advance. If you don't receive a letter, please call our office to schedule the follow-up appointment.  If you need a refill on your cardiac medications before your next appointment, please call your pharmacy.     Signed, Thurmon Fair, MD  01/29/2018 3:58 PM    [facility] [address] [place], [place], [place]  [address] Phone: [phone]; Fax: [phone]

## 2018-01-29 NOTE — Patient Instructions (Addendum)
Medication Instructions: Dr Royann Shiversroitoru has recommended making the following medication changes: 1. TAKE Metolazone (fluid booster) ONLY with you weigh more than 190 pounds and NEVER more than 3 times a week 2. CONTINUE Potassium 20 mEq 3 times a day, TAKE an extra tablet on the days you have to take Metolazone  Labwork: Your physician recommends that you return for lab work in 2-3 weeks - FASTING. Nothing to eat or drink after midnight. You may go to the [facility] in [place] across the street from [facility].  Testing/Procedures: NONE ORDERED  Follow-up: Your physician recommends that you schedule a follow-up appointment in 2-3 months with Randall AnBrittany Strader, PA in our [place] office.  Dr Royann Shiversroitoru recommends that you schedule a follow-up appointment in 6 months. You will receive a reminder letter in the mail two months in advance. If you don't receive a letter, please call our office to schedule the follow-up appointment.  If you need a refill on your cardiac medications before your next appointment, please call your pharmacy.

## 2018-02-19 ENCOUNTER — Other Ambulatory Visit: Payer: Self-pay | Admitting: Student

## 2018-03-06 ENCOUNTER — Encounter (HOSPITAL_COMMUNITY): Payer: Self-pay

## 2018-03-06 ENCOUNTER — Emergency Department (HOSPITAL_COMMUNITY): Payer: Medicare Other

## 2018-03-06 ENCOUNTER — Other Ambulatory Visit: Payer: Self-pay

## 2018-03-06 ENCOUNTER — Inpatient Hospital Stay (HOSPITAL_COMMUNITY)
Admission: EM | Admit: 2018-03-06 | Discharge: 2018-03-10 | DRG: 308 | Disposition: A | Discharge status: Home / Self Care | Payer: Medicare Other | Attending: Internal Medicine | Admitting: Internal Medicine

## 2018-03-06 DIAGNOSIS — I739 Peripheral vascular disease, unspecified: Secondary | ICD-10-CM | POA: Diagnosis not present

## 2018-03-06 DIAGNOSIS — Z951 Presence of aortocoronary bypass graft: Secondary | ICD-10-CM

## 2018-03-06 DIAGNOSIS — M79605 Pain in left leg: Secondary | ICD-10-CM | POA: Diagnosis not present

## 2018-03-06 DIAGNOSIS — Z9102 Food additives allergy status: Secondary | ICD-10-CM

## 2018-03-06 DIAGNOSIS — Z888 Allergy status to other drugs, medicaments and biological substances status: Secondary | ICD-10-CM

## 2018-03-06 DIAGNOSIS — I5043 Acute on chronic combined systolic (congestive) and diastolic (congestive) heart failure: Secondary | ICD-10-CM | POA: Diagnosis not present

## 2018-03-06 DIAGNOSIS — I255 Ischemic cardiomyopathy: Secondary | ICD-10-CM | POA: Diagnosis present

## 2018-03-06 DIAGNOSIS — M109 Gout, unspecified: Secondary | ICD-10-CM | POA: Diagnosis present

## 2018-03-06 DIAGNOSIS — I10 Essential (primary) hypertension: Secondary | ICD-10-CM | POA: Diagnosis not present

## 2018-03-06 DIAGNOSIS — N179 Acute kidney failure, unspecified: Secondary | ICD-10-CM | POA: Diagnosis present

## 2018-03-06 DIAGNOSIS — Z953 Presence of xenogenic heart valve: Secondary | ICD-10-CM

## 2018-03-06 DIAGNOSIS — D72829 Elevated white blood cell count, unspecified: Secondary | ICD-10-CM | POA: Diagnosis present

## 2018-03-06 DIAGNOSIS — I2510 Atherosclerotic heart disease of native coronary artery without angina pectoris: Secondary | ICD-10-CM | POA: Diagnosis present

## 2018-03-06 DIAGNOSIS — I480 Paroxysmal atrial fibrillation: Secondary | ICD-10-CM | POA: Diagnosis not present

## 2018-03-06 DIAGNOSIS — Z9181 History of falling: Secondary | ICD-10-CM

## 2018-03-06 DIAGNOSIS — I130 Hypertensive heart and chronic kidney disease with heart failure and stage 1 through stage 4 chronic kidney disease, or unspecified chronic kidney disease: Secondary | ICD-10-CM | POA: Diagnosis present

## 2018-03-06 DIAGNOSIS — E785 Hyperlipidemia, unspecified: Secondary | ICD-10-CM | POA: Diagnosis present

## 2018-03-06 DIAGNOSIS — T462X5A Adverse effect of other antidysrhythmic drugs, initial encounter: Secondary | ICD-10-CM | POA: Diagnosis present

## 2018-03-06 DIAGNOSIS — R55 Syncope and collapse: Secondary | ICD-10-CM | POA: Diagnosis not present

## 2018-03-06 DIAGNOSIS — Z9119 Patient's noncompliance with other medical treatment and regimen: Secondary | ICD-10-CM

## 2018-03-06 DIAGNOSIS — E782 Mixed hyperlipidemia: Secondary | ICD-10-CM

## 2018-03-06 DIAGNOSIS — E871 Hypo-osmolality and hyponatremia: Secondary | ICD-10-CM | POA: Diagnosis not present

## 2018-03-06 DIAGNOSIS — F10939 Alcohol use, unspecified with withdrawal, unspecified: Secondary | ICD-10-CM | POA: Diagnosis present

## 2018-03-06 DIAGNOSIS — D649 Anemia, unspecified: Secondary | ICD-10-CM | POA: Diagnosis present

## 2018-03-06 DIAGNOSIS — I712 Thoracic aortic aneurysm, without rupture: Secondary | ICD-10-CM | POA: Diagnosis present

## 2018-03-06 DIAGNOSIS — Z720 Tobacco use: Secondary | ICD-10-CM

## 2018-03-06 DIAGNOSIS — Z9114 Patient's other noncompliance with medication regimen: Secondary | ICD-10-CM

## 2018-03-06 DIAGNOSIS — Z95810 Presence of automatic (implantable) cardiac defibrillator: Secondary | ICD-10-CM

## 2018-03-06 DIAGNOSIS — F10230 Alcohol dependence with withdrawal, uncomplicated: Secondary | ICD-10-CM | POA: Diagnosis not present

## 2018-03-06 DIAGNOSIS — R42 Dizziness and giddiness: Secondary | ICD-10-CM | POA: Diagnosis not present

## 2018-03-06 DIAGNOSIS — E876 Hypokalemia: Secondary | ICD-10-CM | POA: Diagnosis not present

## 2018-03-06 DIAGNOSIS — I252 Old myocardial infarction: Secondary | ICD-10-CM

## 2018-03-06 DIAGNOSIS — Z7982 Long term (current) use of aspirin: Secondary | ICD-10-CM

## 2018-03-06 DIAGNOSIS — M79602 Pain in left arm: Secondary | ICD-10-CM

## 2018-03-06 DIAGNOSIS — F10239 Alcohol dependence with withdrawal, unspecified: Secondary | ICD-10-CM | POA: Diagnosis present

## 2018-03-06 DIAGNOSIS — Z91018 Allergy to other foods: Secondary | ICD-10-CM

## 2018-03-06 DIAGNOSIS — T502X5A Adverse effect of carbonic-anhydrase inhibitors, benzothiadiazides and other diuretics, initial encounter: Secondary | ICD-10-CM | POA: Diagnosis present

## 2018-03-06 DIAGNOSIS — F1010 Alcohol abuse, uncomplicated: Secondary | ICD-10-CM | POA: Diagnosis present

## 2018-03-06 DIAGNOSIS — N184 Chronic kidney disease, stage 4 (severe): Secondary | ICD-10-CM | POA: Diagnosis present

## 2018-03-06 DIAGNOSIS — I6523 Occlusion and stenosis of bilateral carotid arteries: Secondary | ICD-10-CM | POA: Diagnosis present

## 2018-03-06 DIAGNOSIS — E038 Other specified hypothyroidism: Secondary | ICD-10-CM | POA: Diagnosis present

## 2018-03-06 DIAGNOSIS — R778 Other specified abnormalities of plasma proteins: Secondary | ICD-10-CM | POA: Diagnosis present

## 2018-03-06 DIAGNOSIS — R7989 Other specified abnormal findings of blood chemistry: Secondary | ICD-10-CM

## 2018-03-06 DIAGNOSIS — E039 Hypothyroidism, unspecified: Secondary | ICD-10-CM | POA: Diagnosis present

## 2018-03-06 DIAGNOSIS — R9431 Abnormal electrocardiogram [ECG] [EKG]: Secondary | ICD-10-CM | POA: Diagnosis present

## 2018-03-06 DIAGNOSIS — Z79899 Other long term (current) drug therapy: Secondary | ICD-10-CM

## 2018-03-06 LAB — I-STAT VENOUS BLOOD GAS, ED
Acid-Base Excess: 11 mmol/L — ABNORMAL HIGH (ref 0.0–2.0)
Bicarbonate: 35.6 mmol/L — ABNORMAL HIGH (ref 20.0–28.0)
O2 Saturation: 44 %
PCO2 VEN: 47 mmHg (ref 44.0–60.0)
PH VEN: 7.487 — AB (ref 7.250–7.430)
TCO2: 37 mmol/L — ABNORMAL HIGH (ref 22–32)
pO2, Ven: 23 mmHg — CL (ref 32.0–45.0)

## 2018-03-06 LAB — BASIC METABOLIC PANEL
ANION GAP: 22 — AB (ref 5–15)
ANION GAP: 22 — AB (ref 5–15)
BUN: 33 mg/dL — ABNORMAL HIGH (ref 6–20)
BUN: 33 mg/dL — ABNORMAL HIGH (ref 6–20)
CALCIUM: 9.4 mg/dL (ref 8.9–10.3)
CO2: 28 mmol/L (ref 22–32)
CO2: 25 mmol/L (ref 22–32)
Calcium: 9.7 mg/dL (ref 8.9–10.3)
Chloride: 85 mmol/L — ABNORMAL LOW (ref 101–111)
Chloride: 86 mmol/L — ABNORMAL LOW (ref 101–111)
Creatinine, Ser: 1.88 mg/dL — ABNORMAL HIGH (ref 0.61–1.24)
Creatinine, Ser: 1.73 mg/dL — ABNORMAL HIGH (ref 0.61–1.24)
GFR calc Af Amer: 39 mL/min — ABNORMAL LOW (ref >60)
GFR, EST AFRICAN AMERICAN: 43 mL/min — AB (ref >60)
GFR, EST NON AFRICAN AMERICAN: 33 mL/min — AB (ref >60)
GFR, EST NON AFRICAN AMERICAN: 37 mL/min — AB (ref >60)
GLUCOSE: 106 mg/dL — AB (ref 65–99)
Glucose, Bld: 90 mg/dL (ref 65–99)
POTASSIUM: 2.2 mmol/L — AB (ref 3.5–5.1)
Potassium: 2.9 mmol/L — ABNORMAL LOW (ref 3.5–5.1)
SODIUM: 135 mmol/L (ref 135–145)
SODIUM: 133 mmol/L — AB (ref 135–145)

## 2018-03-06 LAB — CBC WITH DIFFERENTIAL/PLATELET
BASOS ABS: 0 10*3/uL (ref 0.0–0.1)
Basophils Relative: 0 %
EOS PCT: 0 %
Eosinophils Absolute: 0 10*3/uL (ref 0.0–0.7)
HCT: 32.5 % — ABNORMAL LOW (ref 39.0–52.0)
Hemoglobin: 9.7 g/dL — ABNORMAL LOW (ref 13.0–17.0)
LYMPHS PCT: 11 %
Lymphs Abs: 1.3 10*3/uL (ref 0.7–4.0)
MCH: 23.3 pg — ABNORMAL LOW (ref 26.0–34.0)
MCHC: 29.8 g/dL — ABNORMAL LOW (ref 30.0–36.0)
MCV: 78.1 fL (ref 78.0–100.0)
Monocytes Absolute: 1.2 10*3/uL — ABNORMAL HIGH (ref 0.1–1.0)
Monocytes Relative: 10 %
NEUTROS PCT: 79 %
Neutro Abs: 9.2 10*3/uL — ABNORMAL HIGH (ref 1.7–7.7)
PLATELETS: 291 10*3/uL (ref 150–400)
RBC: 4.16 MIL/uL — AB (ref 4.22–5.81)
RDW: 18.5 % — ABNORMAL HIGH (ref 11.5–15.5)
WBC: 11.8 10*3/uL — AB (ref 4.0–10.5)

## 2018-03-06 LAB — HEPATIC FUNCTION PANEL
ALK PHOS: 56 U/L (ref 38–126)
ALT: 10 U/L — AB (ref 17–63)
AST: 22 U/L (ref 15–41)
Albumin: 3.6 g/dL (ref 3.5–5.0)
BILIRUBIN INDIRECT: 1.1 mg/dL — AB (ref 0.3–0.9)
BILIRUBIN TOTAL: 1.4 mg/dL — AB (ref 0.3–1.2)
Bilirubin, Direct: 0.3 mg/dL (ref 0.1–0.5)
TOTAL PROTEIN: 8.3 g/dL — AB (ref 6.5–8.1)

## 2018-03-06 LAB — CK: Total CK: 69 U/L (ref 49–397)

## 2018-03-06 LAB — RAPID URINE DRUG SCREEN, HOSP PERFORMED
AMPHETAMINES: NOT DETECTED
BARBITURATES: NOT DETECTED
Benzodiazepines: NOT DETECTED
COCAINE: NOT DETECTED
OPIATES: POSITIVE — AB
TETRAHYDROCANNABINOL: NOT DETECTED

## 2018-03-06 LAB — PROTIME-INR
INR: 1.16
Prothrombin Time: 14.7 seconds (ref 11.4–15.2)

## 2018-03-06 LAB — MAGNESIUM
Magnesium: 1.8 mg/dL (ref 1.7–2.4)
Magnesium: 1.9 mg/dL (ref 1.7–2.4)

## 2018-03-06 LAB — URIC ACID: Uric Acid, Serum: 13.4 mg/dL — ABNORMAL HIGH (ref 4.4–7.6)

## 2018-03-06 LAB — PHOSPHORUS: Phosphorus: 3.7 mg/dL (ref 2.5–4.6)

## 2018-03-06 LAB — ETHANOL: Alcohol, Ethyl (B): <10 mg/dL (ref <10)

## 2018-03-06 LAB — TROPONIN I: TROPONIN I: 0.05 ng/mL — AB (ref <0.03)

## 2018-03-06 LAB — LACTIC ACID, PLASMA: Lactic Acid, Venous: 1 mmol/L (ref 0.5–1.9)

## 2018-03-06 MED ORDER — LORAZEPAM 1 MG PO TABS: 0.0000 mg | ORAL_TABLET | Freq: Two times a day (BID) | ORAL | Status: DC

## 2018-03-06 MED ORDER — POTASSIUM CHLORIDE CRYS ER 20 MEQ PO TBCR
60.0000 meq | EXTENDED_RELEASE_TABLET | Freq: Once | ORAL | Status: AC
  Administered 2018-03-06: 60 meq via ORAL
  Filled 2018-03-06: qty 3

## 2018-03-06 MED ORDER — VITAMIN B-1 100 MG PO TABS
100.0000 mg | ORAL_TABLET | Freq: Every day | ORAL | Status: DC
  Administered 2018-03-06 – 2018-03-10 (×5): 100 mg via ORAL
  Filled 2018-03-06 (×5): qty 1

## 2018-03-06 MED ORDER — POTASSIUM CHLORIDE 10 MEQ/100ML IV SOLN
10.0000 meq | INTRAVENOUS | Status: AC
  Administered 2018-03-06 – 2018-03-07 (×4): 10 meq via INTRAVENOUS
  Filled 2018-03-06 (×4): qty 100

## 2018-03-06 MED ORDER — HYDROMORPHONE HCL 1 MG/ML IJ SOLN
0.5000 mg | Freq: Once | INTRAMUSCULAR | Status: AC
  Administered 2018-03-06: 0.5 mg via INTRAVENOUS
  Filled 2018-03-06: qty 1

## 2018-03-06 MED ORDER — IOPAMIDOL (ISOVUE-370) INJECTION 76%
INTRAVENOUS | Status: AC
  Administered 2018-03-06: 100 mL
  Filled 2018-03-06: qty 100

## 2018-03-06 MED ORDER — POTASSIUM CHLORIDE CRYS ER 20 MEQ PO TBCR
40.0000 meq | EXTENDED_RELEASE_TABLET | Freq: Once | ORAL | Status: AC
  Administered 2018-03-06: 40 meq via ORAL
  Filled 2018-03-06: qty 2

## 2018-03-06 MED ORDER — POTASSIUM CHLORIDE 10 MEQ/100ML IV SOLN
10.0000 meq | INTRAVENOUS | Status: AC
  Administered 2018-03-06 (×4): 10 meq via INTRAVENOUS
  Filled 2018-03-06 (×4): qty 100

## 2018-03-06 MED ORDER — HYDROCODONE-ACETAMINOPHEN 5-325 MG PO TABS
1.0000 | ORAL_TABLET | ORAL | Status: DC | PRN
  Administered 2018-03-06 – 2018-03-08 (×5): 2 via ORAL
  Administered 2018-03-09: 1 via ORAL
  Filled 2018-03-06: qty 1
  Filled 2018-03-06 (×5): qty 2

## 2018-03-06 MED ORDER — LORAZEPAM 2 MG/ML IJ SOLN
0.0000 mg | Freq: Two times a day (BID) | INTRAMUSCULAR | Status: DC
Start: 2018-03-09 — End: 2018-03-10

## 2018-03-06 MED ORDER — LORAZEPAM 2 MG/ML IJ SOLN: 0.0000 mg | Freq: Four times a day (QID) | INTRAMUSCULAR | Status: AC

## 2018-03-06 MED ORDER — LORAZEPAM 1 MG PO TABS
0.0000 mg | ORAL_TABLET | Freq: Four times a day (QID) | ORAL | Status: AC
Start: 2018-03-06 — End: 2018-03-08
  Filled 2018-03-06: qty 1

## 2018-03-06 MED ORDER — MAGNESIUM SULFATE 2 GM/50ML IV SOLN
2.0000 g | Freq: Once | INTRAVENOUS | Status: AC
  Administered 2018-03-06: 2 g via INTRAVENOUS
  Filled 2018-03-06: qty 50

## 2018-03-06 MED ORDER — THIAMINE HCL 100 MG/ML IJ SOLN: 100.0000 mg | Freq: Every day | INTRAMUSCULAR | Status: DC

## 2018-03-06 NOTE — ED Notes (Signed)
Admitting md at bedside. Pt requesting pain medication-c/o pain to left leg/knee.

## 2018-03-06 NOTE — ED Triage Notes (Signed)
Pt arrived from home via [facility]. Pt rpeorts that yesterday he was working, sitting on a bucket and went to stand and felt dizzy. States he drove home the person he was working with and came back and laid down. He did not tell anyone until today when he noticed that his weakness was unresolved. Has hx of gout with new swelling in left knee but states this does not feel like gout in the past. EMS notes that pt was very tender to touch and gave 4 mg Morphine PTA.

## 2018-03-06 NOTE — ED Notes (Signed)
Patient denies pain and is resting comfortably.  

## 2018-03-06 NOTE — ED Notes (Signed)
ED Provider at bedside. 

## 2018-03-06 NOTE — H&P (Signed)
[name] ZOX:[id number] DOB: December 24, 1940 DOA: 03/06/2018     PCP: Patient, No Pcp Per   Outpatient Specialists:Cardiology Croitoru   Patient coming from home Lives   With family    Chief Complaint:  Chief Complaint  Patient presents with  . Weakness    HPI: [name] is a 77 y.o. male with medical history significant of etOh abuse, combined systolic diastolic CHF, HLD, HTN, with redo CABG 2004, ischemic cardiomyopathy,  bilateral carotid stenoses and endarterectomy  sp bioprosthetic AVR and AICD Medtronic protecta CRT-D device implanted in 2013, gout, A.fib  Presented with   numerous complaints yesterday she felt lightheaded is to do up from sitting position he came home and laid down but today he felt he still fatigued.  He noticed some swelling in the left knee.  Does have history of gout but unsure if it feels same but different.  Secondary to significant pain EMS gave 4 mg of morphine  Reports severe burning pain worse in both feet left more than right. Pain goes up to the back and left shoulder. Similar to prior episode of hypokalemia.  Denies any fever or chills no chest pain  Reports right ankle started to hurt since yesterday.   Reports have been drinkin 2-3 beers a day denies hx of tremors or DT's  Family endorsed to the emergency department physician the patient has ongoing alcohol abuse and had history of DTs in the past. Patient also endorsing tenderness  of left shoulder  Larina Bras was seen in cardiology office in the beginning of February 2019 at that point was found to have hypokalemia with potassium down to 3.2 the plan was for him to have his labs rechecked in 2 weeks sure if it happened.  He was prescribed potassium 20 mEq 3 times a day Patient has history of atrial fibrillation paroxysmal has history of previous GI bleeding ongoing alcohol abuse not a candidate for anticoagulation has been taking amiodarone. 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  The plan for him to be referred to cardiothoracic surgeon but he is at high risk and cardiology felt to be somewhat concerned While in ER:  Significant initial  Findings:  WBC  11.8 Hemoglobin & Hematocrit     Component Value Date/Time   HGB 9.7 (L) 03/06/2018 1705   HCT 32.5 (L) 03/06/2018 1705   Lactic acid 1.0 CR up from baseline Lab Results  Component Value Date   CREATININE 1.88 (H) 03/06/2018   CREATININE 1.86 (H) 01/28/2018   CREATININE 1.68 (H) 10/27/2017   K2.2  mg 1.8 Anion gap 22  Total bili 1.4 CK 69 CTA: Standard of 5.1 cm ascending thoracic aortic aneurysm, no acute cardiopulmonary disease Severe spinal stenosis at L4-5 desiccated disc herniation L5-S1 to the right    IN ER:  Temp (24hrs), Avg:98.9 F (37.2 C), Min:98.9 F (37.2 C), Max:98.9 F (37.2 C)      on arrival  ED Triage Vitals  Enc Vitals Group     BP 03/06/18 1617 (!) 145/72     Pulse Rate 03/06/18 1617 62     Resp 03/06/18 1618 16     Temp 03/06/18 1618 98.9 F (37.2 C)     Temp Source 03/06/18 1618 Oral     SpO2 03/06/18 1617 100 %     Weight 03/06/18 1623 188 lb (85.3 kg)     Height 03/06/18 1623 6'  1" (1.854 m)     Head Circumference --      Peak Flow --      Pain Score 03/06/18 1623 4     Pain Loc --      Pain Edu? --      Excl. in GC? --      Latest   Blood pressure 138/83, pulse 65, temperature 98.9 F (37.2 C), temperature source Oral, resp. rate 17, height 6\' 1"  (1.854 m), weight 85.3 kg (188 lb), SpO2 100 %.   Following Medications were ordered in ER: Medications  potassium chloride 10 mEq in 100 mL IVPB (0 mEq Intravenous Stopped 03/06/18 2030)  LORazepam (ATIVAN) injection 0-4 mg (not administered)    Or  LORazepam (ATIVAN) tablet 0-4 mg (not administered)  LORazepam (ATIVAN) injection 0-4 mg (not administered)    Or  LORazepam (ATIVAN) tablet 0-4 mg (not administered)  thiamine (VITAMIN B-1) tablet  100 mg (not administered)    Or  thiamine (B-1) injection 100 mg (not administered)  HYDROmorphone (DILAUDID) injection 0.5 mg (0.5 mg Intravenous Given 03/06/18 1702)  iopamidol (ISOVUE-370) 76 % injection (100 mLs  Contrast Given 03/06/18 1801)  potassium chloride SA (K-DUR,KLOR-CON) CR tablet 60 mEq (60 mEq Oral Given 03/06/18 1907)  magnesium sulfate IVPB 2 g 50 mL (0 g Intravenous Stopped 03/06/18 2035)      Hospitalist was called for admission for hypokalemia  Regarding pertinent Chronic problems: last ECHO November 2018 showed EF 30-35%, normal function of the bioprosthetic valve Grade 2 DD Known extensive vascular disease with 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 Review of Systems:    Pertinent positives include:  Fatigue, leg pain back pain  Constitutional:  No weight loss, night sweats, Fevers, chills, weight loss  HEENT:  No headaches, Difficulty swallowing,Tooth/dental problems,Sore throat,  No sneezing, itching, ear ache, nasal congestion, post nasal drip,  Cardio-vascular:  No chest pain, Orthopnea, PND, anasarca, dizziness, palpitations.no Bilateral lower extremity swelling  GI:  No heartburn, indigestion, abdominal pain, nausea, vomiting, diarrhea, change in bowel habits, loss of appetite, melena, blood in stool, hematemesis Resp:  no shortness of breath at rest. No dyspnea on exertion, No excess mucus, no productive cough, No non-productive cough, No coughing up of blood.No change in color of mucus.No wheezing. Skin:  no rash or lesions. No jaundice GU:  no dysuria, change in color of urine, no urgency or frequency. No straining to urinate.  No flank pain.  Musculoskeletal:  No joint pain or no joint swelling. No decreased range of motion. No back pain.  Psych:  No change in mood or affect. No depression or anxiety. No memory loss.  Neuro: no localizing neurological complaints, no tingling, no weakness, no double vision, no gait abnormality, no slurred speech, no  confusion  As per HPI otherwise 10 point review of systems negative.   Past Medical History: Past Medical History:  Diagnosis Date  . Anginal pain (HCC)   . Anxiety   . Aortic stenosis    a. s/p bioprosthetic AVR in 2010  . Arthritis    gout  . CAD (coronary artery disease)    a. s/p CABG in 1992 b. redo CABG in 2004 c. cath in 2010 showing patent LIMA-LAD, free RIMA-OM, and SVG-OM, and 80% stenosis of small PLA branch  . Chest pain   . CHF (congestive heart failure) (HCC)   . Dysrhythmia   . GERD (gastroesophageal reflux disease)    with gas and bloating probably cause of chest pain  .  H/O atrial flutter    a. s/p ablation and not on anticoagulation secondary to GIB and alcohol abuse.   . H/O: gout   . Hyperlipidemia   . Hypertension   . 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 Ischemic cardiomyopathy    a. EF previously 20-25%, improved to 40-45% by echo in 03/2013 b. 10/2017: echo showing of 30-35% with Grade 2 DD, normal function of AVR, moderate MR.   . Myocardial infarction (HCC)   . Pacemaker   . 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 Shortness of breath    Past Surgical History:  Procedure Laterality Date  . BI-VENTRICULAR IMPLANTABLE CARDIOVERTER DEFIBRILLATOR Right 08/18/2012   Procedure: BI-VENTRICULAR IMPLANTABLE CARDIOVERTER DEFIBRILLATOR  (CRT-D);  Surgeon: Marinus MawGregg W Taylor, MD;  [facility];  Service: Cardiovascular;  Laterality: Right;  . CARDIAC CATHETERIZATION  Dec 18, 2003   with a presentation of atrial flutter with rapid ventricular response. He had a cath and two stents to his PDA after his SVG to his RCA. His other graft were patent. He had LV with EF of 30%.   . CAROTID ENDARTERECTOMY     left in December 2003 and know renal artery stenosis of 60-70%  . CORONARY ARTERY BYPASS GRAFT  1993   redo in 2000, he has had multiple MI previous to both procedures.  .  IMPLANTABLE CARDIOVERTER DEFIBRILLATOR (ICD) GENERATOR CHANGE N/A 06/25/2012   Procedure: ICD GENERATOR CHANGE;  Surgeon: Marinus MawGregg W Taylor, MD;  [facility];  Service: Cardiovascular;  Laterality: N/A;  . VALVE REPLACEMENT  2010     Social History:  Ambulatory  Cane or walker       reports that  has never smoked. His smokeless tobacco use includes chew. He reports that he drinks about 8.4 oz of alcohol per week. He reports that he does not use drugs.  Allergies:   Allergies  Allergen Reactions  . Atorvastatin Other (See Comments)    Joint pain (patient takes this, however)  . Other Other (See Comments)    Patient is sodium-restricted, per his admission       Family History:   Family History  Problem Relation Age of Onset  . CAD Brother     Medications: Prior to Admission medications   Medication Sig Start Date End Date Taking? Authorizing Provider  amiodarone (PACERONE) 200 MG tablet TAKE 1 TABLET (200 MG TOTAL) BY MOUTH DAILY. 02/22/18  Yes Croitoru, Mihai, MD  aspirin EC 81 MG tablet Take 81 mg by mouth daily.    Yes [provider]  atorvastatin (LIPITOR) 20 MG tablet TAKE 1 TABLET (20 MG TOTAL) BY MOUTH DAILY. 09/24/17 03/06/18 Yes Croitoru, Mihai, MD  carvedilol (COREG) 3.125 MG tablet Take 1 tablet (3.125 mg total) by mouth 2 (two) times daily. Keep appointment 01/25/18 Patient taking differently: Take 3.125 mg by mouth 2 (two) times daily.  01/19/18 04/19/18 Yes Croitoru, Mihai, MD  furosemide (LASIX) 40 MG tablet Take 2 tablets (80 mg total) by mouth 2 (two) times daily. Patient taking differently: Take 40-80 mg by mouth See admin instructions. Take 80 mg by mouth in the morning and 40-80 mg at bedtime 01/25/18  Yes Croitoru, Mihai, MD  ibuprofen (ADVIL,MOTRIN) 200 MG tablet Take 400 mg by mouth 2 (two) times daily as needed (for pain).   Yes [provider]  metolazone (ZAROXOLYN) 2.5 MG tablet Take 1 tablet (2.5 mg total)  by mouth daily. Take 30  minutes before furosemide (Lasix). Patient taking differently: Take 2.5 mg by mouth See admin instructions. Take 2.5 mg by mouth for 2 consecutive days 30 minutes before Lasix for an overnight weight gain of 3 lbs or more (Max of 3 tablets per week) 01/25/18 04/25/18 Yes Croitoru, Mihai, MD  nitroGLYCERIN (NITROSTAT) 0.4 MG SL tablet PUT1TAB UNDER TONGUE EVERY AS NEEDED FOR CHESTPAIN, UP TO 3DOSES. IF NOT RELIEVED, CALL EMS/MD Patient taking differently: Place 1 tablet (0.4 mg) under the tongue every 5 minutes as needed for chest pain for up to 3 doses/call 9-1-1 and MD if no relief 03/30/17  Yes Croitoru, Mihai, MD  potassium chloride SA (KLOR-CON M20) 20 MEQ tablet Take 1 tablet (20 mEq total) by mouth 3 (three) times daily. Patient taking differently: Take 20 mEq by mouth See admin instructions. Take 20 mEq by mouth two times a day and an additional dose of 20 mEq on Mon/Wed/Fri 01/25/18  Yes Croitoru, Mihai, MD  vitamin B-12 (CYANOCOBALAMIN) 1000 MCG tablet Take 1 tablet (1,000 mcg total) by mouth daily. 02/02/16  Yes Vassie Loll, MD  colchicine 0.6 MG tablet Take 1 tablet (0.6 mg total) by mouth as needed (gout attacks). Patient not taking: Reported on 03/06/2018 04/15/17   Croitoru, Rachelle Hora, MD    Physical Exam: Patient Vitals for the past 24 hrs:  BP Temp Temp src Pulse Resp SpO2 Height Weight  03/06/18 1930 138/83 - - 65 17 100 % - -  03/06/18 1915 135/67 - - 63 14 100 % - -  03/06/18 1900 (!) 115/48 - - 62 14 100 % - -  03/06/18 1845 131/62 - - 63 11 100 % - -  03/06/18 1830 (!) 143/66 - - 64 10 100 % - -  03/06/18 1745 (!) 146/72 - - (!) 59 14 100 % - -  03/06/18 1730 (!) 144/72 - - (!) 59 13 94 % - -  03/06/18 1715 131/72 - - 61 16 100 % - -  03/06/18 1700 (!) 146/67 - - 62 10 98 % - -  03/06/18 1645 (!) 142/71 - - 66 15 100 % - -  03/06/18 1630 (!) 145/70 - - 65 15 99 % - -  03/06/18 1623 - - - - - - 6\' 1"  (1.854 m) 85.3 kg (188 lb)  03/06/18 1619 - - - 63 12 100 % - -  03/06/18  1618 (!) 145/72 98.9 F (37.2 C) Oral 64 16 98 % - -  03/06/18 1617 (!) 145/72 - - 62 - 100 % - -    1. General:  in No Acute distress  well  -appearing 2. Psychological: Alert and  Oriented 3. Head/ENT:    Dry Mucous Membranes                          Head Non traumatic, neck supple                             Poor Dentition 4. SKIN:   decreased Skin turgor,  Skin clean Dry and intact no rash 5. Heart: Regular rate and rhythm no   Murmur, no Rub or gallop 6. Lungs: no wheezes or crackles   7. Abdomen: Soft, non-tender, Non distended   8. Lower extremities: no clubbing, cyanosis, or edema, erythema of right ankle 9. Neurologically Grossly intact, moving all 4 extremities equally  10. MSK: Normal range of motion   body mass index is 24.8 kg/m.  Labs on Admission:   Labs on Admission: I have personally reviewed following labs and imaging studies  CBC: Recent Labs  Lab 03/06/18 1705  WBC 11.8*  NEUTROABS 9.2*  HGB 9.7*  HCT 32.5*  MCV 78.1  PLT 291   Basic Metabolic Panel: Recent Labs  Lab 03/06/18 1705 03/06/18 1905  NA 135  --   K 2.2*  --   CL 85*  --   CO2 28  --   GLUCOSE 106*  --   BUN 33*  --   CREATININE 1.88*  --   CALCIUM 9.7  --   MG  --  1.8   GFR: Estimated Creatinine Clearance: 38.4 mL/min (A) (by C-G formula based on SCr of 1.88 mg/dL (H)). Liver Function Tests: No results for input(s): AST, ALT, ALKPHOS, BILITOT, PROT, ALBUMIN in the last 168 hours. No results for input(s): LIPASE, AMYLASE in the last 168 hours. No results for input(s): AMMONIA in the last 168 hours. Coagulation Profile: No results for input(s): INR, PROTIME in the last 168 hours. Cardiac Enzymes: Recent Labs  Lab 03/06/18 2011  CKTOTAL 69   BNP (last 3 results) Recent Labs    01/28/18 0954  PROBNP 3,838*   HbA1C: No results for input(s): HGBA1C in the last 72 hours. CBG: No results for input(s): GLUCAP in the last 168 hours. Lipid Profile: No results for  input(s): CHOL, HDL, LDLCALC, TRIG, CHOLHDL, LDLDIRECT in the last 72 hours. Thyroid Function Tests: No results for input(s): TSH, T4TOTAL, FREET4, T3FREE, THYROIDAB in the last 72 hours. Anemia Panel: No results for input(s): VITAMINB12, FOLATE, FERRITIN, TIBC, IRON, RETICCTPCT in the last 72 hours. Urine analysis: Sepsis Labs: @LABRCNTIP (procalcitonin:4,lacticidven:4) )No results found for this or any previous visit (from the past 240 hour(s)).    UA  ordered  Lab Results  Component Value Date   HGBA1C  06/24/2009    5.4 (NOTE) The ADA recommends the following therapeutic goal for glycemic control related to Hgb A1c measurement: Goal of therapy: <6.5 Hgb A1c  Reference: American Diabetes Association: Clinical Practice Recommendations 2010, Diabetes Care, 2010, 33: (Suppl  1).    Estimated Creatinine Clearance: 38.4 mL/min (A) (by C-G formula based on SCr of 1.88 mg/dL (H)).  BNP (last 3 results) Recent Labs    01/28/18 0954  PROBNP 3,838*     ECG REPORT  Independently reviewed Rate:63  Rhythm: paced ST&T Change: NA QTC 696  Filed Weights   03/06/18 1623  Weight: 85.3 kg (188 lb)     Cultures:    Component Value Date/Time   SDES TISSUE 06/25/2012 1750   SDES TISSUE 06/25/2012 1750   SPECREQUEST INSIDE POCKET OF ICD PT ON ANCEF 06/25/2012 1750   SPECREQUEST POCKET OF ICD ON SWAB PT ON ANCEF 06/25/2012 1750   CULT NO GROWTH 3 DAYS 06/25/2012 1750   CULT NO GROWTH 3 DAYS 06/25/2012 1750   REPTSTATUS 06/29/2012 FINAL 06/25/2012 1750   REPTSTATUS 06/29/2012 FINAL 06/25/2012 1750     Radiological Exams on Admission: Ct Head Wo Contrast  Result Date: 03/06/2018 CLINICAL DATA:  77 year old presenting with acute onset of left-sided weakness and lower extremity numbness. EXAM: CT HEAD WITHOUT CONTRAST TECHNIQUE: Contiguous axial images were obtained from the base of the skull through the vertex without intravenous contrast. COMPARISON:  06/05/2017, 02/18/2016,  03/02/2013. FINDINGS: Brain: Slight head tilt in the gantry accounts for apparent asymmetry in the cerebral hemispheres. Moderate  age related cortical and deep atrophy, unchanged dating back to 2014. Mild changes of small vessel disease of the white matter diffusely, progressive since 2014. Mild cerebellar atrophy, unchanged. No mass lesion. No midline shift. No acute hemorrhage or hematoma. No extra-axial fluid collections. No evidence of acute infarction. Vascular: Severe bilateral carotid siphon and vertebral artery atherosclerosis. No hyperdense vessel. Skull: No skull fracture or other focal osseous abnormality involving the skull. Sinuses/Orbits: Visualized paranasal sinuses, bilateral mastoid air cells and bilateral middle ear cavities well-aerated. Visualized orbits and globes normal in appearance. Other: None. IMPRESSION: 1. No acute intracranial abnormality. 2. Stable moderate age related generalized atrophy. Mild chronic microvascular ischemic changes of the white matter diffusely. Electronically Signed   By: Hulan Saas M.D.   On: 03/06/2018 19:07   Ct Angio Chest/abd/pel For Dissection W And/or Wo Contrast  Result Date: 03/06/2018 CLINICAL DATA:  77 year old with acute dizziness when standing yesterday while working, associated with generalized weakness. The generalized weakness persists today. Known ascending thoracic aortic aneurysm. EXAM: CT ANGIOGRAPHY CHEST, ABDOMEN AND PELVIS TECHNIQUE: Initially, multidetector CT imaging through the chest was performed. Subsequently, multidetector CT imaging through the chest, abdomen and pelvis was performed using the standard protocol during bolus administration of intravenous contrast. Multiplanar reconstructed images and MIPs were obtained and reviewed to evaluate the vascular anatomy. CONTRAST:  75 mL ISOVUE-370 IOPAMIDOL INJECTION 76% IV. COMPARISON:  CTA chest 06/05/2017.  No prior abdominopelvic CTA. FINDINGS: CTA CHEST FINDINGS Cardiovascular:  Prior sternotomy for CABG. Unenhanced images demonstrate calcified plaque throughout the thoracic aorta and involving the proximal great vessels. Severe three-vessel native coronary atherosclerosis is again demonstrated. Extensive mitral annular calcification. Enhanced images demonstrate severe thickening of the leaflets of the aortic valve, associated with moderate calcification. Heart moderately to markedly enlarged with left ventricular predominance, unchanged. Right subclavian transvenous pacemaker lead tips in the right atrial appendage and at the RV apex, unchanged. No pericardial effusion. Patent coronary grafts. The previously identified ascending thoracic aortic aneurysm measuring up to 5.1 cm diameter is unchanged since June, 2018. No evidence of aneurysm involving the arch or the descending thoracic aorta. Tortuosity and ectasia of the innominate artery is better seen on today's examination, maximum diameter 1.5 cm. Atherosclerosis at the origin of the left common carotid artery without evidence of hemodynamically significant stenosis. Severe atherosclerosis involving the remaining great vessels. No evidence of dissection. Mediastinum/Nodes: No pathologically enlarged mediastinal, hilar or axillary lymph nodes. No mediastinal masses. Normal-appearing esophagus. Subcentimeter nodules in the normal sized thyroid gland as noted previously. Lungs/Pleura: Expected dependent atelectasis posteriorly in the lower lobes. Lung parenchyma otherwise clear without evidence of airspace consolidation, interstitial disease, or parenchymal nodules or masses. Musculoskeletal: Mild degenerative disc disease and spondylosis involving the thoracic spine. No acute abnormalities. Review of the MIP images confirms the above findings. CTA ABDOMEN AND PELVIS FINDINGS VASCULAR Aorta: Severe atherosclerosis with calcified and noncalcified plaque. No evidence of aneurysm. Celiac: Atherosclerosis at its origin without evidence of  hemodynamically significant stenosis. Atherosclerosis at its bifurcation without stenosis. SMA: Atherosclerosis at its origin without evidence of hemodynamically significant stenosis. Atherosclerosis involving the descending portion of the SMA without stenosis. Renals: Single renal arteries bilaterally, each with extensive plaque at its origin, likely causing hemodynamically significant stenoses, especially on the left. IMA: Patent with atherosclerosis at its origin Inflow: Severe atherosclerosis involving the iliofemoral arterial system bilaterally without evidence of significant stenosis. Veins: Not evaluated. Review of the MIP images confirms the above findings. NON-VASCULAR Hepatobiliary: Normal appearing liver for the early arterial  phase of enhancement. Gallbladder normal in appearance without calcified gallstones. No biliary ductal dilation. Pancreas: Moderate atrophy. No pancreatic mass or peripancreatic inflammation. No pancreatic ductal dilation. Spleen: Normal in size and appearance. Adrenals/Urinary Tract: Normal appearing adrenal glands. Left renal atrophy with diffuse cortical thinning. Relatively well-preserved right renal cortex. Benign approximate 3 cm cyst arising from the lateral mid right kidney. Smaller benign cortical cysts in both kidneys. No urinary tract calculi. No hydronephrosis. Perinephric edema bilaterally. Mildly distended normal appearing urinary bladder. Stomach/Bowel: Stomach decompressed and normal in appearance. Normal-appearing small bowel. Mobile cecum present in the right upper quadrant. Large colonic stool burden. Sigmoid colon tortuous. No visible colonic diverticula. Appendix not visible but no evidence of pericecal inflammation. Lymphatic: No pathologic lymphadenopathy. Reproductive: Mild prostate gland enlargement. Normal seminal vesicles. Other: None. Musculoskeletal: Severe degenerative disc disease at L5-S1 with a right posterolateral desiccated disc herniation and  migration of the fragment behind S1. Severe facet degenerative changes throughout the lumbar spine. 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 Degenerative changes involving the sacroiliac joints. Review of the MIP images confirms the above findings. IMPRESSION: CTA Chest: 1. Stable approximate 5.1 cm ascending thoracic aortic aneurysm dating back to June, 2018. Recommend continued semi-annual imaging followup by CTA or MRA and referral to cardiothoracic surgery if not already obtained. This recommendation follows 2010 ACCF/AHA/AATS/ACR/ASA/SCA/SCAI/SIR/STS/SVM Guidelines for the Diagnosis and Management of Patients With Thoracic Aortic Disease. Circulation. 2010; 121: M578-I696. 2.  No acute cardiopulmonary disease. 3. Stable marked cardiomegaly with left ventricular enlargement. Prior CABG. Patent coronary grafts. 4. Marked thickening of the aortic valve leaflets associated with moderate valvular calcification. 5. 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 CTA Abdomen/Pelvis: 1. No evidence of abdominal aortic aneurysm. 2.  Aortic Atherosclerosis (ICD10-170.0) 3. Severe atherosclerosis involving the proximal renal arteries bilaterally, with likely high-grade stenoses, especially on the left. 4. Atherosclerotic though patent celiac artery, SMA and IMA. 5. Left renal atrophy. 6. No acute abnormalities involving the abdomen or pelvis. 7. 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 Desiccated disc herniation at L5-S1. Electronically Signed   By: Hulan Saas M.D.   On: 03/06/2018 19:32    Chart has been reviewed    Assessment/Plan  77 y.o. male with medical history significant of etOh abuse, combined systolic diastolic CHF, HLD, HTN, with redo CABG 2004, ischemic cardiomyopathy,  bilateral carotid stenoses and endarterectomy  sp bioprosthetic AVR and AICD Medtronic protecta CRT-D device implanted in 2013, gout, A.fib  Admitted for hypokalemia postural presyncope  Present on  Admission: . Hypokalemia - - will replace and repeat in AM,  check magnesium level and replace as needed Gout -probable gout of right ankle, start colchicine, given distribution and hx of gout cellulitis less likely. If no improvement would benefit from orthopedics consult for a tap.  Burning leg pain - possibly due to hypokalemia will replace  . PAF (paroxysmal atrial fibrillation) (HCC) -          - CHA2DS2 vas score 4  Not on anticoagulation secondary to Risk of Falls,  recurrent bleeding         -  Rate control:  Currently controlled with  Coreg will continue        - Rhythm control:  Continue amiodarone  . Peripheral vascular disease (HCC) - would benefit from ABI studies, reports exertional leg pain, legs giving out if walks too long . Hypothyroidism - check TSH,  . Hyperlipidemia - continue statin . Essential hypertension - soft bp allow permissive hypertension for now .  Cardiomyopathy, ischemic - appears on dryer side, hold diuretics . Alcohol abuse - CIWA protocol denies hx of DT's . AKI (acute kidney injury) (HCC) - hold diuretics for tonight. Postural presyncope - will need to hold diuretics for tonight. Recheck orthostatics.  echo  Other plan as per orders.  DVT prophylaxis:    Lovenox     Code Status:  FULL CODE as per patient    Family Communication:   Family not at  Bedside    Disposition Plan:   To home once workup is complete and patient is stable                        Would benefit from PT/OT eval prior to DC  ordered                                              Consults called: none   Admission status:     obs   Level of care    tele        I have spent a total of 56 min on this admission  Shima Compere 03/07/2018, 12:53 AM    [facility]  Pager [phone]   after 2 AM please page floor coverage PA If 7AM-7PM, please contact the day team taking care of the patient  [email or web address]  Password [email or web address]

## 2018-03-06 NOTE — ED Provider Notes (Signed)
[name]   CSN: [id number] Arrival date & time: 03/06/18  1607     History   Chief Complaint Chief Complaint  Patient presents with  . Weakness    HPI Donald AmasDonald R Coatney is a 77 y.o. male.  HPI   77 year old male with history of CHF, MI, pacemaker, PVD, who presents the ED today complaining of left shoulder pain, left lower back pain, and left leg pain from left hip all the way down to the foot which began yesterday.  Patient denies any injuries or falls.  States that he has some weakness to the left upper and lower extremity due to pain.  Denies any numbness or tingling to the left upper and lower extremities. Also states that he has had dizzy spells over the last 2 days upon standing.  Symptoms resolved after he sits down.  States that he is not feeling dizzy when sitting down.  He denies any chest pain or shortness of breath.  Denies any headaches, vision changes, abdominal pain, nausea, vomiting, diarrhea.  Does report that he has some abdominal distention.  Denies any lower extremity swelling.  Patient denies any fevers.  Reports a history of gout.  Has some redness and warmth to the right ankle, which he states often happens before he has a gout episode.  Denies current pain or decreased range of motion to the ankle.  Daughter reports that pt looked jaundiced and has h/o ETOH abuse. States he has a h/o DT's, however she is unsure if he has had seizures from withdrawal.   Past Medical History:  Diagnosis Date  . Anginal pain (HCC)   . Anxiety   . Aortic stenosis    a. s/p bioprosthetic AVR in 2010  . Arthritis    gout  . CAD (coronary artery disease)    a. s/p CABG in 1992 b. redo CABG in 2004 c. cath in 2010 showing patent LIMA-LAD, free RIMA-OM, and SVG-OM, and 80% stenosis of small PLA branch  . Chest pain   . CHF (congestive heart failure) (HCC)   . Dysrhythmia   . GERD (gastroesophageal reflux disease)    with gas  and bloating probably cause of chest pain  . H/O atrial flutter    a. s/p ablation and not on anticoagulation secondary to GIB and alcohol abuse.   . H/O: gout   . Hyperlipidemia   . Hypertension   . 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 Ischemic cardiomyopathy    a. EF previously 20-25%, improved to 40-45% by echo in 03/2013 b. 10/2017: echo showing of 30-35% with Grade 2 DD, normal function of AVR, moderate MR.   . Myocardial infarction (HCC)   . Pacemaker   . 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 Shortness of breath     Patient Active Problem List   Diagnosis Date Noted  . Hypokalemia 03/06/2018  . AKI (acute kidney injury) (HCC) 03/06/2018  . Postural dizziness with presyncope 03/06/2018  . Encounter for monitoring amiodarone therapy 01/25/2018  . Ascending aortic aneurysm (HCC) 01/25/2018  . Acute on chronic combined systolic and diastolic CHF (congestive heart failure) (HCC) 01/15/2017  . SA node dysfunction (HCC) 01/15/2017  . B12 deficiency anemia   . Esophageal reflux   . PAF (paroxysmal atrial fibrillation) (HCC)   . HLD (hyperlipidemia)   . Alcohol withdrawal (HCC) 01/31/2016  . Left-sided weakness 01/29/2016  .  Neck pain 01/29/2016  . Clavicle fracture 01/29/2016  . H/O aortic valve replacement 06/04/2014  . 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 CRT-D [employer] 07/12/2012  . Hypothyroidism 03/14/2009  . Hyperlipidemia 03/14/2009  . GOUT 03/14/2009  . ALCOHOLISM 03/14/2009  . Essential hypertension 03/14/2009  . Cardiomyopathy, ischemic 03/14/2009  . CEREBROVASCULAR DISEASE 03/14/2009  . Peripheral vascular disease (HCC) 03/14/2009  . RENAL INSUFFICIENCY, CHRONIC 03/14/2009  . ANEMIA, HX OF 03/14/2009  . History of bilateral carotid endarterectomy 03/14/2009  . CORONARY ARTERY BYPASS GRAFT, HX OF 03/14/2009  . CAD s/p redo CABG 01/17/2009  . ATRIAL  FIBRILLATION 01/17/2009  . COMBINED HEART FAILURE, CHRONIC 01/17/2009    Past Surgical History:  Procedure Laterality Date  . BI-VENTRICULAR IMPLANTABLE CARDIOVERTER DEFIBRILLATOR Right 08/18/2012   Procedure: BI-VENTRICULAR IMPLANTABLE CARDIOVERTER DEFIBRILLATOR  (CRT-D);  Surgeon: Marinus Maw, MD;  Location: [facility] CATH LAB;  Service: Cardiovascular;  Laterality: Right;  . CARDIAC CATHETERIZATION  Dec 18, 2003   with a presentation of atrial flutter with rapid ventricular response. He had a cath and two stents to his PDA after his SVG to his RCA. His other graft were patent. He had LV with EF of 30%.   . CAROTID ENDARTERECTOMY     left in December 2003 and know renal artery stenosis of 60-70%  . CORONARY ARTERY BYPASS GRAFT  1993   redo in 2000, he has had multiple MI previous to both procedures.  . IMPLANTABLE CARDIOVERTER DEFIBRILLATOR (ICD) GENERATOR CHANGE N/A 06/25/2012   Procedure: ICD GENERATOR CHANGE;  Surgeon: Marinus Maw, MD;  Location: [facility] CATH LAB;  Service: Cardiovascular;  Laterality: N/A;  . VALVE REPLACEMENT  2010       Home Medications    Prior to Admission medications   Medication Sig Start Date End Date Taking? Authorizing Provider  amiodarone (PACERONE) 200 MG tablet TAKE 1 TABLET (200 MG TOTAL) BY MOUTH DAILY. 02/22/18  Yes Croitoru, Mihai, MD  aspirin EC 81 MG tablet Take 81 mg by mouth daily.    Yes [provider]  atorvastatin (LIPITOR) 20 MG tablet TAKE 1 TABLET (20 MG TOTAL) BY MOUTH DAILY. 09/24/17 03/06/18 Yes Croitoru, Mihai, MD  carvedilol (COREG) 3.125 MG tablet Take 1 tablet (3.125 mg total) by mouth 2 (two) times daily. Keep appointment 01/25/18 Patient taking differently: Take 3.125 mg by mouth 2 (two) times daily.  01/19/18 04/19/18 Yes Croitoru, Mihai, MD  furosemide (LASIX) 40 MG tablet Take 2 tablets (80 mg total) by mouth 2 (two) times daily. Patient taking differently: Take 40-80 mg by mouth See admin instructions. Take 80 mg by mouth in the  morning and 40-80 mg at bedtime 01/25/18  Yes Croitoru, Mihai, MD  ibuprofen (ADVIL,MOTRIN) 200 MG tablet Take 400 mg by mouth 2 (two) times daily as needed (for pain).   Yes [provider]  metolazone (ZAROXOLYN) 2.5 MG tablet Take 1 tablet (2.5 mg total) by mouth daily. Take 30 minutes before furosemide (Lasix). Patient taking differently: Take 2.5 mg by mouth See admin instructions. Take 2.5 mg by mouth for 2 consecutive days 30 minutes before Lasix for an overnight weight gain of 3 lbs or more (Max of 3 tablets per week) 01/25/18 04/25/18 Yes Croitoru, Mihai, MD  nitroGLYCERIN (NITROSTAT) 0.4 MG SL tablet PUT1TAB UNDER TONGUE EVERY AS NEEDED FOR CHESTPAIN, UP TO 3DOSES. IF NOT RELIEVED, CALL EMS/MD Patient taking differently: Place 1 tablet (0.4 mg) under the tongue every 5 minutes as needed for chest pain for up  to 3 doses/call 9-1-1 and MD if no relief 03/30/17  Yes Croitoru, Mihai, MD  potassium chloride SA (KLOR-CON M20) 20 MEQ tablet Take 1 tablet (20 mEq total) by mouth 3 (three) times daily. Patient taking differently: Take 20 mEq by mouth See admin instructions. Take 20 mEq by mouth two times a day and an additional dose of 20 mEq on Mon/Wed/Fri 01/25/18  Yes Croitoru, Mihai, MD  vitamin B-12 (CYANOCOBALAMIN) 1000 MCG tablet Take 1 tablet (1,000 mcg total) by mouth daily. 02/02/16  Yes Vassie Loll, MD  colchicine 0.6 MG tablet Take 1 tablet (0.6 mg total) by mouth as needed (gout attacks). Patient not taking: Reported on 03/06/2018 04/15/17   Croitoru, Rachelle Hora, MD    Family History Family History  Problem Relation Age of Onset  . CAD Brother     Social History Social History   Tobacco Use  . Smoking status: Never Smoker  . Smokeless tobacco: Current User    Types: Chew  Substance Use Topics  . Alcohol use: Yes    Alcohol/week: 8.4 oz    Types: 14 Cans of beer per week  . Drug use: No     Allergies   Atorvastatin and Other   Review of Systems Review of Systems    Constitutional: Negative for fever.  HENT: Negative for congestion and rhinorrhea.   Eyes: Negative for visual disturbance.  Respiratory: Negative for cough, shortness of breath and wheezing.   Cardiovascular: Negative for chest pain and leg swelling.  Gastrointestinal: Positive for abdominal distention. Negative for abdominal pain, constipation, diarrhea, nausea and vomiting.  Genitourinary: Negative for dysuria, flank pain and hematuria.  Musculoskeletal: Positive for back pain. Negative for neck pain and neck stiffness.       Left leg pain, left shoulder pain  Skin: Positive for color change.  Neurological: Positive for dizziness and light-headedness. Negative for numbness and headaches.     Physical Exam Updated Vital Signs BP (!) 142/65   Pulse 62   Temp 98.9 F (37.2 C) (Oral)   Resp 16   Ht 6\' 1"  (1.854 m)   Wt 85.3 kg (188 lb)   SpO2 98%   BMI 24.80 kg/m   Physical Exam  Constitutional: He is oriented to person, place, and time. He appears well-developed and well-nourished. No distress.  nontoxic  HENT:  Head: Normocephalic and atraumatic.  Mouth/Throat: Oropharynx is clear and moist.  Eyes: Conjunctivae and EOM are normal. Pupils are equal, round, and reactive to light.  Neck: Neck supple.  Cardiovascular: Normal rate, regular rhythm, normal heart sounds and intact distal pulses.  No murmur heard. Pulmonary/Chest: Effort normal and breath sounds normal. No respiratory distress. He has no wheezes.  Abdominal: Soft. Bowel sounds are normal. He exhibits no distension. There is no tenderness. There is no guarding.  No CVA ttp  Musculoskeletal: He exhibits no edema.  Patient has tenderness to the posterior aspect of the left shoulder.  Also with tenderness to left thigh, left calf, and left foot.  Has pain with ankle dorsiflexion and plantarflexion. No erythema or warmth to left ankle joint. Distal pulses intact.  Brisk cap refill bilaterally to lower extremities.   There is an area of erythema to the dorsal aspect of the left ankle that is warm to touch.  No tenderness to palpation to this area.  Normal dorsiflexion and plantarflexion on left. No midline spinal TTP. TTP to left lower back along iliac crest and left sciatic notch.  Neurological: He is alert and  oriented to person, place, and time.  Mental Status:  Alert, thought content appropriate, able to give a coherent history. Speech fluent without evidence of aphasia. Able to follow 2 step commands without difficulty.  Cranial Nerves:  II:  pupils equal, round, reactive to light III,IV, VI: ptosis not present, extra-ocular motions intact bilaterally  V,VII: smile symmetric, facial light touch sensation equal VIII: hearing grossly normal to voice  X: uvula elevates symmetrically  XI: bilateral shoulder shrug symmetric and strong XII: midline tongue extension without fassiculations Motor:  Normal tone. Pt has pain to LUE and LLE with strength testing, but does maintain 5/5 strength of BUE and BLE major muscle groups including strong and equal grip strength and dorsiflexion/plantar flexion Sensory: light touch normal in all extremities. Cerebellar: normal finger-to-nose with bilateral upper extremities Gait: not tested CV: 2+ radial and DP/PT pulses No pronator drift  Skin: Skin is warm and dry.  Psychiatric: He has a normal mood and affect.  Nursing note and vitals reviewed.    ED Treatments / Results  Labs (all labs ordered are listed, but only abnormal results are displayed) Labs Reviewed  CBC WITH DIFFERENTIAL/PLATELET - Abnormal; Notable for the following components:      Result Value   WBC 11.8 (*)    RBC 4.16 (*)    Hemoglobin 9.7 (*)    HCT 32.5 (*)    MCH 23.3 (*)    MCHC 29.8 (*)    RDW 18.5 (*)    Neutro Abs 9.2 (*)    Monocytes Absolute 1.2 (*)    All other components within normal limits  BASIC METABOLIC PANEL - Abnormal; Notable for the following components:   Potassium  2.2 (*)    Chloride 85 (*)    Glucose, Bld 106 (*)    BUN 33 (*)    Creatinine, Ser 1.88 (*)    GFR calc non Af Amer 33 (*)    GFR calc Af Amer 39 (*)    Anion gap 22 (*)    All other components within normal limits  HEPATIC FUNCTION PANEL - Abnormal; Notable for the following components:   Total Protein 8.3 (*)    ALT 10 (*)    Total Bilirubin 1.4 (*)    Indirect Bilirubin 1.1 (*)    All other components within normal limits  RAPID URINE DRUG SCREEN, HOSP PERFORMED - Abnormal; Notable for the following components:   Opiates POSITIVE (*)    All other components within normal limits  TROPONIN I - Abnormal; Notable for the following components:   Troponin I 0.05 (*)    All other components within normal limits  URIC ACID - Abnormal; Notable for the following components:   Uric Acid, Serum 13.4 (*)    All other components within normal limits  I-STAT VENOUS BLOOD GAS, ED - Abnormal; Notable for the following components:   pH, Ven 7.487 (*)    pO2, Ven 23.0 (*)    Bicarbonate 35.6 (*)    TCO2 37 (*)    Acid-Base Excess 11.0 (*)    All other components within normal limits  MAGNESIUM  LACTIC ACID, PLASMA  CK  PHOSPHORUS  MAGNESIUM  ETHANOL  PROTIME-INR  TROPONIN I  TROPONIN I  BASIC METABOLIC PANEL    EKG  EKG Interpretation  Date/Time:  Saturday March 06 2018 16:17:10 EDT Ventricular Rate:  63 PR Interval:    QRS Duration: 184 QT Interval:  679 QTC Calculation: 696 R Axis:   -92 Text  Interpretation:  Ventricular-paced rhythm No further analysis attempted due to paced rhythm Baseline wander in lead(s) V2 V5 V6 Confirmed by Raeford Razor [phone]) on 03/06/2018 6:14:48 PM       Radiology Ct Head Wo Contrast  Result Date: 03/06/2018 CLINICAL DATA:  77 year old presenting with acute onset of left-sided weakness and lower extremity numbness. EXAM: CT HEAD WITHOUT CONTRAST TECHNIQUE: Contiguous axial images were obtained from the base of the skull through the vertex  without intravenous contrast. COMPARISON:  06/05/2017, 02/18/2016, 03/02/2013. FINDINGS: Brain: Slight head tilt in the gantry accounts for apparent asymmetry in the cerebral hemispheres. Moderate age related cortical and deep atrophy, unchanged dating back to 2014. Mild changes of small vessel disease of the white matter diffusely, progressive since 2014. Mild cerebellar atrophy, unchanged. No mass lesion. No midline shift. No acute hemorrhage or hematoma. No extra-axial fluid collections. No evidence of acute infarction. Vascular: Severe bilateral carotid siphon and vertebral artery atherosclerosis. No hyperdense vessel. Skull: No skull fracture or other focal osseous abnormality involving the skull. Sinuses/Orbits: Visualized paranasal sinuses, bilateral mastoid air cells and bilateral middle ear cavities well-aerated. Visualized orbits and globes normal in appearance. Other: None. IMPRESSION: 1. No acute intracranial abnormality. 2. Stable moderate age related generalized atrophy. Mild chronic microvascular ischemic changes of the white matter diffusely. Electronically Signed   By: Hulan Saas M.D.   On: 03/06/2018 19:07   Ct Angio Chest/abd/pel For Dissection W And/or Wo Contrast  Result Date: 03/06/2018 CLINICAL DATA:  77 year old with acute dizziness when standing yesterday while working, associated with generalized weakness. The generalized weakness persists today. Known ascending thoracic aortic aneurysm. EXAM: CT ANGIOGRAPHY CHEST, ABDOMEN AND PELVIS TECHNIQUE: Initially, multidetector CT imaging through the chest was performed. Subsequently, multidetector CT imaging through the chest, abdomen and pelvis was performed using the standard protocol during bolus administration of intravenous contrast. Multiplanar reconstructed images and MIPs were obtained and reviewed to evaluate the vascular anatomy. CONTRAST:  75 mL ISOVUE-370 IOPAMIDOL INJECTION 76% IV. COMPARISON:  CTA chest 06/05/2017.  No prior  abdominopelvic CTA. FINDINGS: CTA CHEST FINDINGS Cardiovascular: Prior sternotomy for CABG. Unenhanced images demonstrate calcified plaque throughout the thoracic aorta and involving the proximal great vessels. Severe three-vessel native coronary atherosclerosis is again demonstrated. Extensive mitral annular calcification. Enhanced images demonstrate severe thickening of the leaflets of the aortic valve, associated with moderate calcification. Heart moderately to markedly enlarged with left ventricular predominance, unchanged. Right subclavian transvenous pacemaker lead tips in the right atrial appendage and at the RV apex, unchanged. No pericardial effusion. Patent coronary grafts. The previously identified ascending thoracic aortic aneurysm measuring up to 5.1 cm diameter is unchanged since June, 2018. No evidence of aneurysm involving the arch or the descending thoracic aorta. Tortuosity and ectasia of the innominate artery is better seen on today's examination, maximum diameter 1.5 cm. Atherosclerosis at the origin of the left common carotid artery without evidence of hemodynamically significant stenosis. Severe atherosclerosis involving the remaining great vessels. No evidence of dissection. Mediastinum/Nodes: No pathologically enlarged mediastinal, hilar or axillary lymph nodes. No mediastinal masses. Normal-appearing esophagus. Subcentimeter nodules in the normal sized thyroid gland as noted previously. Lungs/Pleura: Expected dependent atelectasis posteriorly in the lower lobes. Lung parenchyma otherwise clear without evidence of airspace consolidation, interstitial disease, or parenchymal nodules or masses. Musculoskeletal: Mild degenerative disc disease and spondylosis involving the thoracic spine. No acute abnormalities. Review of the MIP images confirms the above findings. CTA ABDOMEN AND PELVIS FINDINGS VASCULAR Aorta: Severe atherosclerosis with calcified and noncalcified plaque. No evidence  of  aneurysm. Celiac: Atherosclerosis at its origin without evidence of hemodynamically significant stenosis. Atherosclerosis at its bifurcation without stenosis. SMA: Atherosclerosis at its origin without evidence of hemodynamically significant stenosis. Atherosclerosis involving the descending portion of the SMA without stenosis. Renals: Single renal arteries bilaterally, each with extensive plaque at its origin, likely causing hemodynamically significant stenoses, especially on the left. IMA: Patent with atherosclerosis at its origin Inflow: Severe atherosclerosis involving the iliofemoral arterial system bilaterally without evidence of significant stenosis. Veins: Not evaluated. Review of the MIP images confirms the above findings. NON-VASCULAR Hepatobiliary: Normal appearing liver for the early arterial phase of enhancement. Gallbladder normal in appearance without calcified gallstones. No biliary ductal dilation. Pancreas: Moderate atrophy. No pancreatic mass or peripancreatic inflammation. No pancreatic ductal dilation. Spleen: Normal in size and appearance. Adrenals/Urinary Tract: Normal appearing adrenal glands. Left renal atrophy with diffuse cortical thinning. Relatively well-preserved right renal cortex. Benign approximate 3 cm cyst arising from the lateral mid right kidney. Smaller benign cortical cysts in both kidneys. No urinary tract calculi. No hydronephrosis. Perinephric edema bilaterally. Mildly distended normal appearing urinary bladder. Stomach/Bowel: Stomach decompressed and normal in appearance. Normal-appearing small bowel. Mobile cecum present in the right upper quadrant. Large colonic stool burden. Sigmoid colon tortuous. No visible colonic diverticula. Appendix not visible but no evidence of pericecal inflammation. Lymphatic: No pathologic lymphadenopathy. Reproductive: Mild prostate gland enlargement. Normal seminal vesicles. Other: None. Musculoskeletal: Severe degenerative disc disease at  L5-S1 with a right posterolateral desiccated disc herniation and migration of the fragment behind S1. Severe facet degenerative changes throughout the lumbar spine. 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 Degenerative changes involving the sacroiliac joints. Review of the MIP images confirms the above findings. IMPRESSION: CTA Chest: 1. Stable approximate 5.1 cm ascending thoracic aortic aneurysm dating back to June, 2018. Recommend continued semi-annual imaging followup by CTA or MRA and referral to cardiothoracic surgery if not already obtained. This recommendation follows 2010 ACCF/AHA/AATS/ACR/ASA/SCA/SCAI/SIR/STS/SVM Guidelines for the Diagnosis and Management of Patients With Thoracic Aortic Disease. Circulation. 2010; 121: X528-U132. 2.  No acute cardiopulmonary disease. 3. Stable marked cardiomegaly with left ventricular enlargement. Prior CABG. Patent coronary grafts. 4. Marked thickening of the aortic valve leaflets associated with moderate valvular calcification. 5. 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 CTA Abdomen/Pelvis: 1. No evidence of abdominal aortic aneurysm. 2.  Aortic Atherosclerosis (ICD10-170.0) 3. Severe atherosclerosis involving the proximal renal arteries bilaterally, with likely high-grade stenoses, especially on the left. 4. Atherosclerotic though patent celiac artery, SMA and IMA. 5. Left renal atrophy. 6. No acute abnormalities involving the abdomen or pelvis. 7. 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 Desiccated disc herniation at L5-S1. Electronically Signed   By: Hulan Saas M.D.   On: 03/06/2018 19:32    Procedures Procedures (including critical care time)  Medications Ordered in ED Medications  potassium chloride 10 mEq in 100 mL IVPB (0 mEq Intravenous Stopped 03/06/18 2322)  LORazepam (ATIVAN) injection 0-4 mg (not administered)    Or  LORazepam (ATIVAN) tablet 0-4 mg (not administered)  LORazepam (ATIVAN) injection 0-4 mg (not  administered)    Or  LORazepam (ATIVAN) tablet 0-4 mg (not administered)  thiamine (VITAMIN B-1) tablet 100 mg (100 mg Oral Given 03/06/18 2030)    Or  thiamine (B-1) injection 100 mg ( Intravenous See Alternative 03/06/18 2030)  HYDROcodone-acetaminophen (NORCO/VICODIN) 5-325 MG per tablet 1-2 tablet (2 tablets Oral Given 03/06/18 2335)  potassium chloride 10 mEq in 100 mL IVPB (10 mEq Intravenous New Bag/Given 03/06/18 2335)  HYDROmorphone (DILAUDID) injection 0.5 mg (0.5 mg Intravenous Given 03/06/18 1702)  iopamidol (ISOVUE-370) 76 % injection (100 mLs  Contrast Given 03/06/18 1801)  potassium chloride SA (K-DUR,KLOR-CON) CR tablet 60 mEq (60 mEq Oral Given 03/06/18 1907)  magnesium sulfate IVPB 2 g 50 mL (0 g Intravenous Stopped 03/06/18 2035)  potassium chloride SA (K-DUR,KLOR-CON) CR tablet 40 mEq (40 mEq Oral Given 03/06/18 2335)     Initial Impression / Assessment and Plan / ED Course  I have reviewed the triage vital signs and the nursing notes.  Pertinent labs & imaging results that were available during my care of the patient were reviewed by me and considered in my medical decision making (see chart for details).  Clinical Course as of Mar 06 2348  Sat Mar 06, 2018  2346 BP: 128/64 [CC]  2346 BP: 128/64 [CC]    Clinical Course User Index [CC] Gentry Seeber S, PA-C   Discussed pt presentation and exam findings with Dr. Juleen China, who personally evaluated the patient and advised to order head CT and because of patient's history of thoracic aortic aneurysm advised to order CT angio of chest/abdomen/pelvis to rule out dissection.  Reviewed results of workup and agrees with plan for admission for hypokalemia.  CONSULT, discussed patient case with Dr. Adela Glimpse who will admit the patient into her care.  She recommends ordering phosphorus and magnesium.  Final Clinical Impressions(s) / ED Diagnoses   Final diagnoses:  Hypokalemia  Musculoskeletal pain of left upper extremity  Acute pain  of left lower extremity   77 year old male presenting with left upper extremity and left lower extremity pain for 1 day.  Also complaining of dizziness upon standing for 1 day.  Vital signs stable.  Patient afebrile.  Patient in no acute distress.  Patient has tenderness to the posterior aspect of the left shoulder as well as diffusely to the left lower extremity.   had reported weakness due to pain, no weakness noted on exam.  Also with dizziness that is positional. No focal neurologic deficits or weakness on exam. No pronator drift. Do not suspect neurologic etiology of symptoms.  CT head negative for acute intracranial abnormality.  Unclear etiology of patient's symptoms.  CK negative, doubt rhabdomyolysis.  Symptoms could be due to low potassium as it was found to be 2.2. This was repleted and pt will require admission for further tx and monitoring due to this.   Due to patient's history of thoracic aortic aneurysm, and complains of dizziness, left shoulder and back pain, CT angios of chest/abdomen/pelvis was ordered to rule out dissection.  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  Cardiomegaly was noted with ventricular enlargement on the left is also stable.  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  No evidence of abdominal aortic aneurysm was noted on CT abdomen pelvis.  Did show high-grade stenosis to proximal renal arteries.  Labwork also notable for mild leukocytosis to 11.8, stable anemia at 9.7.  Improved from prior.  BMP with stable elevated creatinine to 1.88.  Elevated anion gap to 22.  Hepatic function panel with mildly elevated total bili, but no gross elevation of transaminases. ECG with ventricular paced rhythm, HR 63.  Pt admitted to hospitalist service for further management of hypokalemia.  ED Discharge Orders    None       Rayne Du 03/06/18 2349    Raeford Razor, MD 03/07/18 1723

## 2018-03-07 ENCOUNTER — Encounter (HOSPITAL_COMMUNITY): Payer: Self-pay | Admitting: Physician Assistant

## 2018-03-07 ENCOUNTER — Observation Stay (HOSPITAL_BASED_OUTPATIENT_CLINIC_OR_DEPARTMENT_OTHER): Payer: Medicare Other

## 2018-03-07 DIAGNOSIS — I361 Nonrheumatic tricuspid (valve) insufficiency: Secondary | ICD-10-CM | POA: Diagnosis not present

## 2018-03-07 DIAGNOSIS — R9431 Abnormal electrocardiogram [ECG] [EKG]: Secondary | ICD-10-CM | POA: Diagnosis present

## 2018-03-07 DIAGNOSIS — R778 Other specified abnormalities of plasma proteins: Secondary | ICD-10-CM | POA: Diagnosis present

## 2018-03-07 DIAGNOSIS — N179 Acute kidney failure, unspecified: Secondary | ICD-10-CM | POA: Diagnosis not present

## 2018-03-07 DIAGNOSIS — R7989 Other specified abnormal findings of blood chemistry: Secondary | ICD-10-CM | POA: Diagnosis present

## 2018-03-07 LAB — MAGNESIUM: MAGNESIUM: 2.2 mg/dL (ref 1.7–2.4)

## 2018-03-07 LAB — COMPREHENSIVE METABOLIC PANEL
ALT: 10 U/L — AB (ref 17–63)
ANION GAP: 12 (ref 5–15)
AST: 23 U/L (ref 15–41)
Albumin: 3.4 g/dL — ABNORMAL LOW (ref 3.5–5.0)
Alkaline Phosphatase: 54 U/L (ref 38–126)
BUN: 36 mg/dL — ABNORMAL HIGH (ref 6–20)
CHLORIDE: 90 mmol/L — AB (ref 101–111)
CO2: 29 mmol/L (ref 22–32)
Calcium: 9 mg/dL (ref 8.9–10.3)
Creatinine, Ser: 2.07 mg/dL — ABNORMAL HIGH (ref 0.61–1.24)
GFR calc non Af Amer: 30 mL/min — ABNORMAL LOW (ref >60)
GFR, EST AFRICAN AMERICAN: 34 mL/min — AB (ref >60)
Glucose, Bld: 127 mg/dL — ABNORMAL HIGH (ref 65–99)
Potassium: 3.4 mmol/L — ABNORMAL LOW (ref 3.5–5.1)
SODIUM: 131 mmol/L — AB (ref 135–145)
Total Bilirubin: 0.9 mg/dL (ref 0.3–1.2)
Total Protein: 7.9 g/dL (ref 6.5–8.1)

## 2018-03-07 LAB — TROPONIN I
TROPONIN I: 0.05 ng/mL — AB (ref <0.03)
Troponin I: 0.04 ng/mL (ref <0.03)

## 2018-03-07 LAB — ECHOCARDIOGRAM COMPLETE
Height: 73 in
Weight: 3000 oz

## 2018-03-07 LAB — CBC
HEMATOCRIT: 28.3 % — AB (ref 39.0–52.0)
HEMOGLOBIN: 8.4 g/dL — AB (ref 13.0–17.0)
MCH: 23.1 pg — AB (ref 26.0–34.0)
MCHC: 29.7 g/dL — ABNORMAL LOW (ref 30.0–36.0)
MCV: 78 fL (ref 78.0–100.0)
Platelets: 266 10*3/uL (ref 150–400)
RBC: 3.63 MIL/uL — ABNORMAL LOW (ref 4.22–5.81)
RDW: 18.3 % — ABNORMAL HIGH (ref 11.5–15.5)
WBC: 13.2 10*3/uL — ABNORMAL HIGH (ref 4.0–10.5)

## 2018-03-07 LAB — TSH: TSH: 55.222 u[IU]/mL — AB (ref 0.350–4.500)

## 2018-03-07 LAB — PHOSPHORUS: Phosphorus: 2.4 mg/dL — ABNORMAL LOW (ref 2.5–4.6)

## 2018-03-07 MED ORDER — ASPIRIN EC 81 MG PO TBEC
81.0000 mg | DELAYED_RELEASE_TABLET | Freq: Every day | ORAL | Status: DC
  Administered 2018-03-07 – 2018-03-10 (×4): 81 mg via ORAL
  Filled 2018-03-07 (×5): qty 1

## 2018-03-07 MED ORDER — AMIODARONE HCL 200 MG PO TABS
200.0000 mg | ORAL_TABLET | Freq: Every day | ORAL | Status: DC
  Administered 2018-03-07 – 2018-03-09 (×3): 200 mg via ORAL
  Filled 2018-03-07 (×3): qty 1

## 2018-03-07 MED ORDER — ORAL CARE MOUTH RINSE
15.0000 mL | Freq: Two times a day (BID) | OROMUCOSAL | Status: DC
  Administered 2018-03-07 – 2018-03-09 (×5): 15 mL via OROMUCOSAL

## 2018-03-07 MED ORDER — ONDANSETRON HCL 4 MG PO TABS: 4.0000 mg | ORAL_TABLET | Freq: Four times a day (QID) | ORAL | Status: DC | PRN

## 2018-03-07 MED ORDER — ACETAMINOPHEN 325 MG PO TABS: 650.0000 mg | ORAL_TABLET | Freq: Four times a day (QID) | ORAL | Status: DC | PRN

## 2018-03-07 MED ORDER — ATORVASTATIN CALCIUM 20 MG PO TABS
20.0000 mg | ORAL_TABLET | Freq: Every day | ORAL | Status: DC
  Administered 2018-03-07 – 2018-03-10 (×4): 20 mg via ORAL
  Filled 2018-03-07 (×4): qty 1

## 2018-03-07 MED ORDER — ENOXAPARIN SODIUM 40 MG/0.4ML ~~LOC~~ SOLN
40.0000 mg | SUBCUTANEOUS | Status: DC
  Administered 2018-03-07 – 2018-03-08 (×2): 40 mg via SUBCUTANEOUS
  Filled 2018-03-07 (×2): qty 0.4

## 2018-03-07 MED ORDER — FUROSEMIDE 10 MG/ML IJ SOLN
40.0000 mg | Freq: Every day | INTRAMUSCULAR | Status: DC
  Filled 2018-03-07: qty 4

## 2018-03-07 MED ORDER — ONDANSETRON HCL 4 MG/2ML IJ SOLN: 4.0000 mg | Freq: Four times a day (QID) | INTRAMUSCULAR | Status: DC | PRN

## 2018-03-07 MED ORDER — COLCHICINE 0.6 MG PO TABS
0.6000 mg | ORAL_TABLET | Freq: Two times a day (BID) | ORAL | Status: DC
  Administered 2018-03-07 – 2018-03-10 (×7): 0.6 mg via ORAL
  Filled 2018-03-07 (×7): qty 1

## 2018-03-07 MED ORDER — ACETAMINOPHEN 650 MG RE SUPP: 650.0000 mg | Freq: Four times a day (QID) | RECTAL | Status: DC | PRN

## 2018-03-07 MED ORDER — POTASSIUM CHLORIDE CRYS ER 20 MEQ PO TBCR: 40.0000 meq | EXTENDED_RELEASE_TABLET | Freq: Two times a day (BID) | ORAL | Status: DC

## 2018-03-07 MED ORDER — CARVEDILOL 3.125 MG PO TABS
3.1250 mg | ORAL_TABLET | Freq: Two times a day (BID) | ORAL | Status: DC
  Administered 2018-03-07 – 2018-03-10 (×7): 3.125 mg via ORAL
  Filled 2018-03-07 (×7): qty 1

## 2018-03-07 MED ORDER — POTASSIUM CHLORIDE CRYS ER 10 MEQ PO TBCR
10.0000 meq | EXTENDED_RELEASE_TABLET | Freq: Two times a day (BID) | ORAL | Status: DC
  Administered 2018-03-07 – 2018-03-08 (×3): 10 meq via ORAL
  Filled 2018-03-07 (×3): qty 1

## 2018-03-07 NOTE — Progress Notes (Signed)
Orthostatic vitals unable to be completed due to patient not being able to stand.

## 2018-03-07 NOTE — ED Notes (Signed)
Patient denies pain and is resting comfortably.  

## 2018-03-07 NOTE — ED Notes (Signed)
Interrogated pacemaker 

## 2018-03-07 NOTE — ED Notes (Signed)
Unable to transfer patient at this time. Cardiology at bedside at this time.

## 2018-03-07 NOTE — H&P (Signed)
PROGRESS NOTE    [name]  ZOX:[id number] DOB: [birth year] DOA: 03/06/2018 PCP: Patient, No Pcp Per  Outpatient Specialists:    Brief Narrative:   [name] is a 77 y.o. male with medical history significant for but not limited to combined systolic diastolic CHF,AFIB,  ischemic cardiomyopathy, s/p CABG,bilateral carotid stenoses s/p  Endarterectomy, s/p bioprosthetic AVR and AICD [employer] protecta CRT-D device implanted in 2013,  and EtOh abuse presents with multiple complaints including increasing shortness of breath with peripheral edema and weight gain with questionable near syncopal episode according to H&P but denies today. In the ED patient was noted to have severe hypokalemia of 2.2 with a magnesium of 1.8 and is admitted for further management.    Assessment & Plan:   Active Problems:   Hypothyroidism   Hyperlipidemia   Essential hypertension   Cardiomyopathy, ischemic   Peripheral vascular disease (HCC)   Alcohol withdrawal (HCC)   PAF (paroxysmal atrial fibrillation) (HCC)   Hypokalemia   AKI (acute kidney injury) (HCC)   Postural dizziness with presyncope   Prolonged QT interval   Elevated troponin  #1 paroxysmal atrial fibrillation: CHA2DS2 vas score 4 No anticoagulation due to Risk of Falls/recurrent bleeding  Rate control with  Coreg Continue amiodarone  #2 hypokalemia: Replete electrolyte imbalance follow clinically  #3 leg pains: Colchicine initiated admission for possible right ankle gout Consider Lyrica or gabapentin if no better Consider ABIs before  #4 Ischemic cardiomyopathy: Mild acute decompensation due to medication non-compliance Diuresis with monitoring of electrolytes and renal function Monitor QTc  #5 Acute kidney injury: Monitor renal function with electrolytes Consider nephrology consult       DVT prophylaxis: (Lovenox Code Status: (Full Family Communication:  Disposition Plan: (Home   Consultants:    Cardiology  Procedures:  Antimicrobials:  Subjective: No chest pains, breathing a little better today  Objective: Vitals:   03/07/18 0730 03/07/18 0745 03/07/18 0914 03/07/18 0930  BP: 140/70 118/79  138/77  Pulse: 65 64  66  Resp: 18 15  16   Temp:    99.2 F (37.3 C)  TempSrc:    Oral  SpO2: 100% 100% 100% 100%  Weight:    85 kg (187 lb 8 oz)  Height:    6\' 1"  (1.854 m)    Intake/Output Summary (Last 24 hours) at 03/07/2018 1041 Last data filed at 03/07/2018 0450 Gross per 24 hour  Intake 750 ml  Output -  Net 750 ml   Filed Weights   03/06/18 1623 03/07/18 0930  Weight: 85.3 kg (188 lb) 85 kg (187 lb 8 oz)    Examination:  General exam: Appears calm and comfortable  Respiratory system: Bibasilar decreased breath sounds otherwise clear to auscultation. Cardiovascular system: S1 & S2 heard, RRR. No JVD, murmurs, rubs, gallops or clicks.  Trace bipedal pitting edema  Gastrointestinal system: Abdomen is nondistended, soft and nontender. No organomegaly or masses felt. Normal bowel sounds heard. Central nervous system: Alert and oriented. No focal neurological deficits. Extremities: Symmetric 5 x 5 power. Skin: No rashes, lesions or ulcers Psychiatry: Judgement and insight appear normal. Mood & affect appropriate.     Data Reviewed: I have personally reviewed following labs and imaging studies  CBC: Recent Labs  Lab 03/06/18 1705 03/07/18 0914  WBC 11.8* 13.2*  NEUTROABS 9.2*  --   HGB 9.7* 8.4*  HCT 32.5* 28.3*  MCV 78.1 78.0  PLT 291 266   Basic Metabolic Panel: Recent Labs  Lab 03/06/18 1705 03/06/18  1905 03/06/18 2133 03/06/18 2240 03/07/18 0914  NA 135  --   --  133* 131*  K 2.2*  --   --  2.9* 3.4*  CL 85*  --   --  86* 90*  CO2 28  --   --  25 29  GLUCOSE 106*  --   --  90 127*  BUN 33*  --   --  33* 36*  CREATININE 1.88*  --   --  1.73* 2.07*  CALCIUM 9.7  --   --  9.4 9.0  MG  --  1.8 1.9  --  2.2  PHOS  --   --  3.7  --  2.4*    GFR: Estimated Creatinine Clearance: 34.8 mL/min (A) (by C-G formula based on SCr of 2.07 mg/dL (H)). Liver Function Tests: Recent Labs  Lab 03/06/18 2011 03/07/18 0914  AST 22 23  ALT 10* 10*  ALKPHOS 56 54  BILITOT 1.4* 0.9  PROT 8.3* 7.9  ALBUMIN 3.6 3.4*   No results for input(s): LIPASE, AMYLASE in the last 168 hours. No results for input(s): AMMONIA in the last 168 hours. Coagulation Profile: Recent Labs  Lab 03/06/18 2133  INR 1.16   Cardiac Enzymes: Recent Labs  Lab 03/06/18 2011 03/06/18 2133 03/07/18 0255 03/07/18 0913  CKTOTAL 69  --   --   --   TROPONINI  --  0.05* 0.05* 0.04*   BNP (last 3 results) Recent Labs    01/28/18 0954  PROBNP 3,838*   HbA1C: No results for input(s): HGBA1C in the last 72 hours. CBG: No results for input(s): GLUCAP in the last 168 hours. Lipid Profile: No results for input(s): CHOL, HDL, LDLCALC, TRIG, CHOLHDL, LDLDIRECT in the last 72 hours. Thyroid Function Tests: No results for input(s): TSH, T4TOTAL, FREET4, T3FREE, THYROIDAB in the last 72 hours. Anemia Panel: No results for input(s): VITAMINB12, FOLATE, FERRITIN, TIBC, IRON, RETICCTPCT in the last 72 hours. Urine analysis:    Component Value Date/Time   COLORURINE STRAW (A) 06/05/2017 1352   APPEARANCEUR CLEAR 06/05/2017 1352   LABSPEC 1.006 06/05/2017 1352   PHURINE 7.0 06/05/2017 1352   GLUCOSEU NEGATIVE 06/05/2017 1352   HGBUR SMALL (A) 06/05/2017 1352   BILIRUBINUR NEGATIVE 06/05/2017 1352   KETONESUR NEGATIVE 06/05/2017 1352   PROTEINUR NEGATIVE 06/05/2017 1352   UROBILINOGEN 0.2 03/02/2013 1646   NITRITE NEGATIVE 06/05/2017 1352   LEUKOCYTESUR NEGATIVE 06/05/2017 1352   Sepsis Labs: @LABRCNTIP (procalcitonin:4,lacticidven:4)  )No results found for this or any previous visit (from the past 240 hour(s)).       Radiology Studies: Ct Head Wo Contrast  Result Date: 03/06/2018 CLINICAL DATA:  77 year old presenting with acute onset of left-sided  weakness and lower extremity numbness. EXAM: CT HEAD WITHOUT CONTRAST TECHNIQUE: Contiguous axial images were obtained from the base of the skull through the vertex without intravenous contrast. COMPARISON:  06/05/2017, 02/18/2016, 03/02/2013. FINDINGS: Brain: Slight head tilt in the gantry accounts for apparent asymmetry in the cerebral hemispheres. Moderate age related cortical and deep atrophy, unchanged dating back to 2014. Mild changes of small vessel disease of the white matter diffusely, progressive since 2014. Mild cerebellar atrophy, unchanged. No mass lesion. No midline shift. No acute hemorrhage or hematoma. No extra-axial fluid collections. No evidence of acute infarction. Vascular: Severe bilateral carotid siphon and vertebral artery atherosclerosis. No hyperdense vessel. Skull: No skull fracture or other focal osseous abnormality involving the skull. Sinuses/Orbits: Visualized paranasal sinuses, bilateral mastoid air cells and bilateral middle ear cavities  well-aerated. Visualized orbits and globes normal in appearance. Other: None. IMPRESSION: 1. No acute intracranial abnormality. 2. Stable moderate age related generalized atrophy. Mild chronic microvascular ischemic changes of the white matter diffusely. Electronically Signed   By: Hulan Saas M.D.   On: 03/06/2018 19:07   Ct Angio Chest/abd/pel For Dissection W And/or Wo Contrast  Result Date: 03/06/2018 CLINICAL DATA:  77 year old with acute dizziness when standing yesterday while working, associated with generalized weakness. The generalized weakness persists today. Known ascending thoracic aortic aneurysm. EXAM: CT ANGIOGRAPHY CHEST, ABDOMEN AND PELVIS TECHNIQUE: Initially, multidetector CT imaging through the chest was performed. Subsequently, multidetector CT imaging through the chest, abdomen and pelvis was performed using the standard protocol during bolus administration of intravenous contrast. Multiplanar reconstructed images and  MIPs were obtained and reviewed to evaluate the vascular anatomy. CONTRAST:  75 mL [id number] IOPAMIDOL INJECTION 76% IV. COMPARISON:  CTA chest 06/05/2017.  No prior abdominopelvic CTA. FINDINGS: CTA CHEST FINDINGS Cardiovascular: Prior sternotomy for CABG. Unenhanced images demonstrate calcified plaque throughout the thoracic aorta and involving the proximal great vessels. Severe three-vessel native coronary atherosclerosis is again demonstrated. Extensive mitral annular calcification. Enhanced images demonstrate severe thickening of the leaflets of the aortic valve, associated with moderate calcification. Heart moderately to markedly enlarged with left ventricular predominance, unchanged. Right subclavian transvenous pacemaker lead tips in the right atrial appendage and at the RV apex, unchanged. No pericardial effusion. Patent coronary grafts. The previously identified ascending thoracic aortic aneurysm measuring up to 5.1 cm diameter is unchanged since June, 2018. No evidence of aneurysm involving the arch or the descending thoracic aorta. Tortuosity and ectasia of the innominate artery is better seen on today's examination, maximum diameter 1.5 cm. Atherosclerosis at the origin of the left common carotid artery without evidence of hemodynamically significant stenosis. Severe atherosclerosis involving the remaining great vessels. No evidence of dissection. Mediastinum/Nodes: No pathologically enlarged mediastinal, hilar or axillary lymph nodes. No mediastinal masses. Normal-appearing esophagus. Subcentimeter nodules in the normal sized thyroid gland as noted previously. Lungs/Pleura: Expected dependent atelectasis posteriorly in the lower lobes. Lung parenchyma otherwise clear without evidence of airspace consolidation, interstitial disease, or parenchymal nodules or masses. Musculoskeletal: Mild degenerative disc disease and spondylosis involving the thoracic spine. No acute abnormalities. Review of the MIP  images confirms the above findings. CTA ABDOMEN AND PELVIS FINDINGS VASCULAR Aorta: Severe atherosclerosis with calcified and noncalcified plaque. No evidence of aneurysm. Celiac: Atherosclerosis at its origin without evidence of hemodynamically significant stenosis. Atherosclerosis at its bifurcation without stenosis. SMA: Atherosclerosis at its origin without evidence of hemodynamically significant stenosis. Atherosclerosis involving the descending portion of the SMA without stenosis. Renals: Single renal arteries bilaterally, each with extensive plaque at its origin, likely causing hemodynamically significant stenoses, especially on the left. IMA: Patent with atherosclerosis at its origin Inflow: Severe atherosclerosis involving the iliofemoral arterial system bilaterally without evidence of significant stenosis. Veins: Not evaluated. Review of the MIP images confirms the above findings. NON-VASCULAR Hepatobiliary: Normal appearing liver for the early arterial phase of enhancement. Gallbladder normal in appearance without calcified gallstones. No biliary ductal dilation. Pancreas: Moderate atrophy. No pancreatic mass or peripancreatic inflammation. No pancreatic ductal dilation. Spleen: Normal in size and appearance. Adrenals/Urinary Tract: Normal appearing adrenal glands. Left renal atrophy with diffuse cortical thinning. Relatively well-preserved right renal cortex. Benign approximate 3 cm cyst arising from the lateral mid right kidney. Smaller benign cortical cysts in both kidneys. No urinary tract calculi. No hydronephrosis. Perinephric edema bilaterally. Mildly distended normal appearing urinary bladder. Stomach/Bowel:  Stomach decompressed and normal in appearance. Normal-appearing small bowel. Mobile cecum present in the right upper quadrant. Large colonic stool burden. Sigmoid colon tortuous. No visible colonic diverticula. Appendix not visible but no evidence of pericecal inflammation. Lymphatic: No  pathologic lymphadenopathy. Reproductive: Mild prostate gland enlargement. Normal seminal vesicles. Other: None. Musculoskeletal: Severe degenerative disc disease at L5-S1 with a right posterolateral desiccated disc herniation and migration of the fragment behind S1. Severe facet degenerative changes throughout the lumbar spine. 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 Degenerative changes involving the sacroiliac joints. Review of the MIP images confirms the above findings. IMPRESSION: CTA Chest: 1. Stable approximate 5.1 cm ascending thoracic aortic aneurysm dating back to June, 2018. Recommend continued semi-annual imaging followup by CTA or MRA and referral to cardiothoracic surgery if not already obtained. This recommendation follows 2010 ACCF/AHA/AATS/ACR/ASA/SCA/SCAI/SIR/STS/SVM Guidelines for the Diagnosis and Management of Patients With Thoracic Aortic Disease. Circulation. 2010; 121: Z610-R604. 2.  No acute cardiopulmonary disease. 3. Stable marked cardiomegaly with left ventricular enlargement. Prior CABG. Patent coronary grafts. 4. Marked thickening of the aortic valve leaflets associated with moderate valvular calcification. 5. 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 CTA Abdomen/Pelvis: 1. No evidence of abdominal aortic aneurysm. 2.  Aortic Atherosclerosis (ICD10-170.0) 3. Severe atherosclerosis involving the proximal renal arteries bilaterally, with likely high-grade stenoses, especially on the left. 4. Atherosclerotic though patent celiac artery, SMA and IMA. 5. Left renal atrophy. 6. No acute abnormalities involving the abdomen or pelvis. 7. 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 Desiccated disc herniation at L5-S1. Electronically Signed   By: Hulan Saas M.D.   On: 03/06/2018 19:32        Scheduled Meds: . amiodarone  200 mg Oral Daily  . aspirin EC  81 mg Oral Daily  . atorvastatin  20 mg Oral q1800  . carvedilol  3.125 mg Oral BID  . colchicine   0.6 mg Oral BID  . enoxaparin (LOVENOX) injection  40 mg Subcutaneous Q24H  . LORazepam  0-4 mg Intravenous Q6H   Or  . LORazepam  0-4 mg Oral Q6H  . [START ON 03/09/2018] LORazepam  0-4 mg Intravenous Q12H   Or  . [START ON 03/09/2018] LORazepam  0-4 mg Oral Q12H  . mouth rinse  15 mL Mouth Rinse BID  . thiamine  100 mg Oral Daily   Or  . thiamine  100 mg Intravenous Daily   Continuous Infusions:   LOS: 0 days    Time spent: 20    OSEI-[email or web address],Ron Beske, MD [facility] Pager [phone]  If 7PM-7AM, please contact night-coverage [email or web address] Password [facility] 03/07/2018, 10:41 AM

## 2018-03-07 NOTE — Consult Note (Signed)
Cardiology Consultation:   Patient ID: [name]; [id number]; 1941/05/19   Admit date: 03/06/2018 Date of Consult: 03/07/2018  Primary Care Provider: Patient, No Pcp Per Primary Cardiologist: Dr [name], 01/29/2017 Primary Electrophysiologist:  Dr [name] 2013   Patient Profile:   [name] is a 77 y.o. 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  History of Present Illness:   [name] was seen by Dr [name] 02/08. At that time, he was AV pacing, at dry wt of 190 lbs, K+ 3.1, Cr 1.9. Prn metolazone added, repeat labs 2 weeks, Kdur 60 meq daily.  [name] has multiple complaints.  He complains of pain in multiple areas, especially his left ankle and foot.  He feels generally bad.    From a cardiac standpoint, he states his weight has jumped up in the last few days.  He has taken the as needed metolazone once.  The metolazone helped, but he is still above his baseline.  He feels his baseline is 180 pounds.  He denies orthopnea or PND, but has had increased dyspnea on exertion and states he has had some lower extremity edema although he does not have any at this time.    He has not had any chest pain, but feels generally weak.  His ICD has not fired.  He has been short of breath with exertion so has not had much claudication symptoms.  He has not  Had any presyncope or syncope.  He has had some palpitations.  His left foot and ankle feel better with the medications he has received since being in the emergency room.  He admits to noncompliance with 2000 mg/day sodium diet and admits that he drinks more than 2 L daily of all liquids.   Past Medical History:  Diagnosis Date  . Anginal pain (HCC)   . Anxiety    . Aortic stenosis    a. s/p bioprosthetic AVR in 2010  . Arthritis    gout  . CAD (coronary artery disease)    a. s/p CABG in 1992 b. redo CABG in 2004 c. cath in 2010 showing patent LIMA-LAD, free RIMA-OM, and SVG-OM, and 80% stenosis of small PLA branch  . Chest pain   . CHF (congestive heart failure) (HCC)   . Dysrhythmia   . GERD (gastroesophageal reflux disease)    with gas and bloating probably cause of chest pain  . H/O atrial flutter    a. s/p ablation and not on anticoagulation secondary to GIB and alcohol abuse.   . H/O: gout   . Hyperlipidemia   . Hypertension   . ICD (implantable cardiac defibrillator) infection, s/p removal of ICD 07/12/2012   re-implanted Medtronic BiV ICD, serial number [id number]   . Ischemic cardiomyopathy    a. EF previously 20-25%, improved to 40-45% by echo in 03/2013 b. 10/2017: echo showing of 30-35% with Grade 2 DD, normal function of AVR, moderate MR.   . Myocardial infarction (HCC)   . 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 Shortness of breath     Past Surgical History:  Procedure Laterality Date  . BI-VENTRICULAR IMPLANTABLE CARDIOVERTER DEFIBRILLATOR Right 08/18/2012  Medtronic BiV ICD, serial number [id number] ; Procedure: BI-VENTRICULAR IMPLANTABLE CARDIOVERTER DEFIBRILLATOR  (CRT-D);  Surgeon: Marinus Maw, MD;  Location: [facility] CATH LAB;  Service: Cardiovascular;  Laterality: Right;  . CARDIAC CATHETERIZATION  Dec 18, 2003   with a presentation of atrial flutter with rapid ventricular response. He had a cath and two stents to his PDA after his SVG to his RCA. His other graft were patent. He had LV with EF of 30%.   . CAROTID ENDARTERECTOMY  11/2002   left in December 2003 and known renal artery stenosis of 60-70%  . CORONARY ARTERY BYPASS GRAFT  1993   redo in 2000, he has had multiple MI previous to both procedures.  . IMPLANTABLE CARDIOVERTER DEFIBRILLATOR (ICD) GENERATOR CHANGE N/A 06/25/2012     Procedure: ICD GENERATOR CHANGE;  Surgeon: Marinus Maw, MD;  Location: [facility] CATH LAB;  Service: Cardiovascular;  Laterality: N/A;  . VALVE REPLACEMENT  2010     Medication Sig  amiodarone (PACERONE) 200 MG tablet TAKE 1 TABLET (200 MG TOTAL) BY MOUTH DAILY.  aspirin EC 81 MG tablet Take 81 mg by mouth daily.   atorvastatin (LIPITOR) 20 MG tablet TAKE 1 TABLET (20 MG TOTAL) BY MOUTH DAILY.  carvedilol (COREG) 3.125 MG tablet Take 1 tablet (3.125 mg total) by mouth 2 (two) times daily. Keep appointment 01/25/18 Patient taking differently: Take 3.125 mg by mouth 2 (two) times daily.   furosemide (LASIX) 40 MG tablet Take 2 tablets (80 mg total) by mouth 2 (two) times daily. Patient taking differently: Take 40-80 mg by mouth See admin instructions. Take 80 mg by mouth in the morning and 40-80 mg at bedtime  ibuprofen (ADVIL,MOTRIN) 200 MG tablet Take 400 mg by mouth 2 (two) times daily as needed (for pain).  metolazone (ZAROXOLYN) 2.5 MG tablet Take 1 tablet (2.5 mg total) by mouth daily. Take 30 minutes before furosemide (Lasix). Patient taking differently: Take 2.5 mg by mouth See admin instructions. Take 2.5 mg by mouth for 2 consecutive days 30 minutes before Lasix for an overnight weight gain of 3 lbs or more (Max of 3 tablets per week)  nitroGLYCERIN (NITROSTAT) 0.4 MG SL tablet PUT1TAB UNDER TONGUE EVERY AS NEEDED FOR CHESTPAIN, UP TO 3DOSES. IF NOT RELIEVED, CALL EMS/MD Patient taking differently: Place 1 tablet (0.4 mg) under the tongue every 5 minutes as needed for chest pain for up to 3 doses/call 9-1-1 and MD if no relief  potassium chloride SA (KLOR-CON M20) 20 MEQ tablet Take 1 tablet (20 mEq total) by mouth 3 (three) times daily. Patient taking differently: Take 20 mEq by mouth See admin instructions. Take 20 mEq by mouth two times a day and an additional dose of 20 mEq on Mon/Wed/Fri  vitamin B-12 (CYANOCOBALAMIN) 1000 MCG tablet Take 1 tablet (1,000 mcg total) by mouth daily.   colchicine 0.6 MG tablet Take 1 tablet (0.6 mg total) by mouth as needed (gout attacks). Patient not taking: Reported on 03/06/2018    Inpatient Medications: Scheduled Meds: . amiodarone  200 mg Oral Daily  . aspirin EC  81 mg Oral Daily  . atorvastatin  20 mg Oral q1800  . carvedilol  3.125 mg Oral BID  . colchicine  0.6 mg Oral BID  . enoxaparin (LOVENOX) injection  40 mg Subcutaneous Q24H  . LORazepam  0-4 mg Intravenous Q6H   Or  . LORazepam  0-4 mg Oral Q6H  . [START ON 03/09/2018] LORazepam  0-4 mg Intravenous Q12H  Or  . [START ON 03/09/2018] LORazepam  0-4 mg Oral Q12H  . thiamine  100 mg Oral Daily   Or  . thiamine  100 mg Intravenous Daily   Continuous Infusions:  PRN Meds: acetaminophen **OR** acetaminophen, HYDROcodone-acetaminophen  Allergies:    Allergies  Allergen Reactions  . Atorvastatin Other (See Comments)    Joint pain (patient takes this, however)  . Other Other (See Comments)    Patient is sodium-restricted, per his admission    Social History:   Social History   Socioeconomic History  . Marital status: Married    Spouse name: Not on file  . Number of children: Not on file  . Years of education: Not on file  . Highest education level: Not on file  Social Needs  . Financial resource strain: Not on file  . Food insecurity - worry: Not on file  . Food insecurity - inability: Not on file  . Transportation needs - medical: Not on file  . Transportation needs - non-medical: Not on file  Occupational History  . Not on file  Tobacco Use  . Smoking status: Never Smoker  . Smokeless tobacco: Current User    Types: Chew  Substance and Sexual Activity  . Alcohol use: Yes    Alcohol/week: 8.4 oz    Types: 14 Cans of beer per week  . Drug use: No  . Sexual activity: Not on file  Other Topics Concern  . Not on file  Social History Narrative  . Not on file    Family History:   Family History  Problem Relation Age of Onset  . CAD Brother     Family Status:  Family Status  Relation Name Status  . Brother  (Not Specified)  . Mother  Deceased  . Father  Deceased    ROS:  Please see the history of present illness.  All other ROS reviewed and negative.     Physical Exam/Data:   Vitals:   03/07/18 0730 03/07/18 0745 03/07/18 0914 03/07/18 0930  BP: 140/70 118/79  138/77  Pulse: 65 64  66  Resp: 18 15  16   Temp:    99.2 F (37.3 C)  TempSrc:    Oral  SpO2: 100% 100% 100% 100%  Weight:    187 lb 8 oz (85 kg)  Height:    6\' 1"  (1.854 m)    Intake/Output Summary (Last 24 hours) at 03/07/2018 1027 Last data filed at 03/07/2018 0450 Gross per 24 hour  Intake 750 ml  Output -  Net 750 ml   Filed Weights   03/06/18 1623 03/07/18 0930  Weight: 188 lb (85.3 kg) 187 lb 8 oz (85 kg)   Body mass index is 24.74 kg/m.  General:  Well nourished, well developed, in no acute distress HEENT: normal Lymph: no adenopathy Neck: JVD 9 cm  Endocrine:  No thryomegaly Vascular: R>L carotid bruits; upper extremity pulses 2+, bilat LE pulses 1+ but palpable Cardiac:  normal S1, S2; RRR; no murmur  Lungs:  Decreased BS bases bilaterally, no wheezing, rhonchi or rales  Abd: soft, nontender, no hepatomegaly  Ext: no edema Musculoskeletal:  No deformities, BUE and BLE strength normal and equal; L ankle w/ edema and some erythema Skin: warm and dry  Neuro:  CNs 2-12 intact, no focal abnormalities noted Psych:  Normal affect   EKG:  The EKG was personally reviewed and demonstrates:  03/16, A fib w/ V pacing QT/QTc 679/696, previously 554/598 Telemetry:  Telemetry was  personally reviewed and demonstrates:  ?A F  Relevant CV Studies:  ECHO: 11/04/2017 - Left ventricle: The cavity size was mildly dilated. Wall   thickness was increased in a pattern of mild LVH. Hypokinesis of   the apex. Basal inferior akinesis. Inferolateral and   anterolateral severe hypokinesis. Systolic function was   moderately to severely reduced. The  estimated ejection fraction   was in the range of 30% to 35%. Features are consistent with a   pseudonormal left ventricular filling pattern, with concomitant   abnormal relaxation and increased filling pressure (grade 2   diastolic dysfunction). E/medial e&' > 15, suggesting LV end   diastolic pressure at least 20 mmHg. - Aortic valve: Bioprosthetic aortic valve. No significant   bioprosthetic valve stenosis. There was no regurgitation. - Aorta: Severe dilation of ascending aorta. Ascending aortic   diameter: 51 mm (S). - Mitral valve: Mildly calcified annulus. Mildly calcified leaflets   . There was moderate, posteriorly directed regurgitation. - Left atrium: The atrium was moderately dilated. - Right ventricle: The cavity size was mildly dilated. Pacer wire   or catheter noted in right ventricle. Systolic function was   moderately reduced. - Right atrium: The atrium was moderately dilated. - Tricuspid valve: Peak RV-RA gradient (S): 29 mm Hg. - Pulmonary arteries: PA peak pressure: 44 mm Hg (S). - Systemic veins: IVC measured 2.7 cm with < 50% respirophasic   variation, suggesting RA pressure 15 mmHg. Impressions: - Mildly dilated LV with mild LV hypertrophy. Wall motion   abnormalities as noted above. EF 30-35%. Moderate diastolic   dysfunction with evidence for elevated LV filling pressure.   Mildly dilated RV with moderately decreased systolic function.   Biatrial enlargement. Normal bioprosthetic aortic valve. Moderate   mitral regurgitation. Mild pulmonary hypertension. Dilated IVC   suggestive of elevated RV filling pressure. 5.1 cm ascending   aortic aneurysm.   CATH: 05/10/2009 ANGIOGRAPHIC DATA:  1. Left ventricular:  Left ventricular systolic function was markedly      depressed with global hypokinesis and inferior wall akinesis with      ejection fraction of 22, at most 25%.  There was no significant      mitral regurgitation.  2. Right coronary artery:  The RCA  is occluded in the proximal      segment.  Distally, it is supplied by the saphenous vein graft.  3. SVG to PDA:  SVG to PDA is widely patent.  It retrogradely fills      the PLA which has a mid 80-85% stenosis minimally worsened since      cardiac catheterization in 2008.  4. Left main coronary artery:  Left main coronary artery is occluded      in the proximal segment.  5. Circumflex coronary artery:  Circumflex coronary artery is a large-      caliber vessel.  It is supplied by saphenous vein graft and also by      a skipped graft free RIMA from the saphenous vein graft to the      obtuse marginal.  6. SVG to ramus intermediate:  SVG to ramus intermediate is widely      patent.  7. The RIMA from SVG to OM-2:  The free skip RIMA from the SVG to OM-2 is widely patent.  8. LAD:  LAD is a moderate-to-large-caliber vessel.  It is occluded in      the proximal segment.  The diagonal 1 is large and is supplied by  saphenous vein graft and the distal LAD is supplied by LIMA.  9. SVG to D1:  SVG to D1 is widely patent. 10. LIMA to LAD:  LIMA to LAD is widely patent.  IMPRESSION:  1. One-vessel coronary artery disease (PLA with 80-85% stenosis).      This has minimally progressed since 2008.  This does not explain      recurrent heart failure.  2. Severe left ventricular systolic dysfunction, ejection fraction      around 20% with inferior akinesis.  No significant mitral      regurgitation.  3. Moderate pulmonary hypertension with preserved cardiac output.  4. Peak-to-peak gradient of 33-36 mmHg across the aortic valve by      aortic valve pullback and left ventricle to femoral artery      respectively.  Mean gradient of 21.1 with an aortic valve area      calculated at 0.89 sq. cm.  5. Moderate pulmonary hypertension  Preserved cardiac output and cardiac index.  RECOMMENDATIONS:  We will have TCTS see the patient regarding aortic valve replacement.  Small posterolateral artery  disease cannot explain his recurrent heart failure.  I suspect the aortic valve stenosis is  contributing significantly to his recurrent heart failure.  Laboratory Data:  Chemistry Recent Labs  Lab 03/06/18 1705 03/06/18 2240 03/07/18 0914  NA 135 133* 131*  K 2.2* 2.9* 3.4*  CL 85* 86* 90*  CO2 28 25 29   GLUCOSE 106* 90 127*  BUN 33* 33* 36*  CREATININE 1.88* 1.73* 2.07*  CALCIUM 9.7 9.4 9.0  GFRNONAA 33* 37* 30*  GFRAA 39* 43* 34*  ANIONGAP 22* 22* 12    Lab Results  Component Value Date   ALT 10 (L) 03/07/2018   AST 23 03/07/2018   ALKPHOS 54 03/07/2018   BILITOT 0.9 03/07/2018   Hematology Recent Labs  Lab 03/06/18 1705 03/07/18 0914  WBC 11.8* 13.2*  RBC 4.16* 3.63*  HGB 9.7* 8.4*  HCT 32.5* 28.3*  MCV 78.1 78.0  MCH 23.3* 23.1*  MCHC 29.8* 29.7*  RDW 18.5* 18.3*  PLT 291 266   Cardiac Enzymes Recent Labs  Lab 03/06/18 2133 03/07/18 0255 03/07/18 0913  TROPONINI 0.05* 0.05* 0.04*     Magnesium:  Magnesium  Date Value Ref Range Status  03/07/2018 2.2 1.7 - 2.4 mg/dL Final    Comment:    Performed at [facility], [address] [place], [place] [id number]     Radiology/Studies:  Ct Head Wo Contrast  Result Date: 03/06/2018 CLINICAL DATA:  [id number] year old presenting with acute onset of left-sided weakness and lower extremity numbness. EXAM: CT HEAD WITHOUT CONTRAST TECHNIQUE: Contiguous axial images were obtained from the base of the skull through the vertex without intravenous contrast. COMPARISON:  06/05/2017, 02/18/2016, 03/02/2013. FINDINGS: Brain: Slight head tilt in the gantry accounts for apparent asymmetry in the cerebral hemispheres. Moderate age related cortical and deep atrophy, unchanged dating back to 2014. Mild changes of small vessel disease of the white matter diffusely, progressive since 2014. Mild cerebellar atrophy, unchanged. No mass lesion. No midline shift. No acute hemorrhage or hematoma. No extra-axial fluid collections. No  evidence of acute infarction. Vascular: Severe bilateral carotid siphon and vertebral artery atherosclerosis. No hyperdense vessel. Skull: No skull fracture or other focal osseous abnormality involving the skull. Sinuses/Orbits: Visualized paranasal sinuses, bilateral mastoid air cells and bilateral middle ear cavities well-aerated. Visualized orbits and globes normal in appearance. Other: None. IMPRESSION: 1. No acute intracranial abnormality. 2. Stable  moderate age related generalized atrophy. Mild chronic microvascular ischemic changes of the white matter diffusely. Electronically Signed   By: Hulan Saas M.D.   On: 03/06/2018 19:07   Ct Angio Chest/abd/pel For Dissection W And/or Wo Contrast  Result Date: 03/06/2018 CLINICAL DATA:  77 year old with acute dizziness when standing yesterday while working, associated with generalized weakness. The generalized weakness persists today. Known ascending thoracic aortic aneurysm. EXAM: CT ANGIOGRAPHY CHEST, ABDOMEN AND PELVIS TECHNIQUE: Initially, multidetector CT imaging through the chest was performed. Subsequently, multidetector CT imaging through the chest, abdomen and pelvis was performed using the standard protocol during bolus administration of intravenous contrast. Multiplanar reconstructed images and MIPs were obtained and reviewed to evaluate the vascular anatomy. CONTRAST:  75 mL ISOVUE-370 IOPAMIDOL INJECTION 76% IV. COMPARISON:  CTA chest 06/05/2017.  No prior abdominopelvic CTA. FINDINGS: CTA CHEST FINDINGS Cardiovascular: Prior sternotomy for CABG. Unenhanced images demonstrate calcified plaque throughout the thoracic aorta and involving the proximal great vessels. Severe three-vessel native coronary atherosclerosis is again demonstrated. Extensive mitral annular calcification. Enhanced images demonstrate severe thickening of the leaflets of the aortic valve, associated with moderate calcification. Heart moderately to markedly enlarged with left  ventricular predominance, unchanged. Right subclavian transvenous pacemaker lead tips in the right atrial appendage and at the RV apex, unchanged. No pericardial effusion. Patent coronary grafts. The previously identified ascending thoracic aortic aneurysm measuring up to 5.1 cm diameter is unchanged since June, 2018. No evidence of aneurysm involving the arch or the descending thoracic aorta. Tortuosity and ectasia of the innominate artery is better seen on today's examination, maximum diameter 1.5 cm. Atherosclerosis at the origin of the left common carotid artery without evidence of hemodynamically significant stenosis. Severe atherosclerosis involving the remaining great vessels. No evidence of dissection. Mediastinum/Nodes: No pathologically enlarged mediastinal, hilar or axillary lymph nodes. No mediastinal masses. Normal-appearing esophagus. Subcentimeter nodules in the normal sized thyroid gland as noted previously. Lungs/Pleura: Expected dependent atelectasis posteriorly in the lower lobes. Lung parenchyma otherwise clear without evidence of airspace consolidation, interstitial disease, or parenchymal nodules or masses. Musculoskeletal: Mild degenerative disc disease and spondylosis involving the thoracic spine. No acute abnormalities. Review of the MIP images confirms the above findings. CTA ABDOMEN AND PELVIS FINDINGS VASCULAR Aorta: Severe atherosclerosis with calcified and noncalcified plaque. No evidence of aneurysm. Celiac: Atherosclerosis at its origin without evidence of hemodynamically significant stenosis. Atherosclerosis at its bifurcation without stenosis. SMA: Atherosclerosis at its origin without evidence of hemodynamically significant stenosis. Atherosclerosis involving the descending portion of the SMA without stenosis. Renals: Single renal arteries bilaterally, each with extensive plaque at its origin, likely causing hemodynamically significant stenoses, especially on the left. IMA: Patent  with atherosclerosis at its origin Inflow: Severe atherosclerosis involving the iliofemoral arterial system bilaterally without evidence of significant stenosis. Veins: Not evaluated. Review of the MIP images confirms the above findings. NON-VASCULAR Hepatobiliary: Normal appearing liver for the early arterial phase of enhancement. Gallbladder normal in appearance without calcified gallstones. No biliary ductal dilation. Pancreas: Moderate atrophy. No pancreatic mass or peripancreatic inflammation. No pancreatic ductal dilation. Spleen: Normal in size and appearance. Adrenals/Urinary Tract: Normal appearing adrenal glands. Left renal atrophy with diffuse cortical thinning. Relatively well-preserved right renal cortex. Benign approximate 3 cm cyst arising from the lateral mid right kidney. Smaller benign cortical cysts in both kidneys. No urinary tract calculi. No hydronephrosis. Perinephric edema bilaterally. Mildly distended normal appearing urinary bladder. Stomach/Bowel: Stomach decompressed and normal in appearance. Normal-appearing small bowel. Mobile cecum present in the right upper quadrant. Large  colonic stool burden. Sigmoid colon tortuous. No visible colonic diverticula. Appendix not visible but no evidence of pericecal inflammation. Lymphatic: No pathologic lymphadenopathy. Reproductive: Mild prostate gland enlargement. Normal seminal vesicles. Other: None. Musculoskeletal: Severe degenerative disc disease at L5-S1 with a right posterolateral desiccated disc herniation and migration of the fragment behind S1. Severe facet degenerative changes throughout the lumbar spine. 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 Degenerative changes involving the sacroiliac joints. Review of the MIP images confirms the above findings. IMPRESSION: CTA Chest: 1. Stable approximate 5.1 cm ascending thoracic aortic aneurysm dating back to June, 2018. Recommend continued semi-annual imaging followup by CTA or MRA and  referral to cardiothoracic surgery if not already obtained. This recommendation follows 2010 ACCF/AHA/AATS/ACR/ASA/SCA/SCAI/SIR/STS/SVM Guidelines for the Diagnosis and Management of Patients With Thoracic Aortic Disease. Circulation. 2010; 121: W098-J191. 2.  No acute cardiopulmonary disease. 3. Stable marked cardiomegaly with left ventricular enlargement. Prior CABG. Patent coronary grafts. 4. Marked thickening of the aortic valve leaflets associated with moderate valvular calcification. 5. 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 CTA Abdomen/Pelvis: 1. No evidence of abdominal aortic aneurysm. 2.  Aortic Atherosclerosis (ICD10-170.0) 3. Severe atherosclerosis involving the proximal renal arteries bilaterally, with likely high-grade stenoses, especially on the left. 4. Atherosclerotic though patent celiac artery, SMA and IMA. 5. Left renal atrophy. 6. No acute abnormalities involving the abdomen or pelvis. 7. 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 Desiccated disc herniation at L5-S1. Electronically Signed   By: Hulan Saas M.D.   On: 03/06/2018 19:32    Assessment and Plan:   1. PAF: - p waves difficult to see on ECG but device interrogation shows no afib - continue to follow - CHA2DS2VASc= (age x 2, CAD, CHF, HTN) - Not an anticoag candidate due to fall risk and ETOH use  2. Prolonged QT/QTc - will recheck ECG, but it does appear prolonged from last month. - initial K+ 2.2, now 2.9 - he is on amio but no vent ectopy so will not hold it. - K+ and Mg being supplemented  3. Chronic Combined CHF - wt is above stated baseline and Optivol is elevated as well. - However, no CHF on CTA  - Discuss w/ MD if he should get a dose of IV Lasix, may want to wait till K+ is higher. - f/u on echo results  4. Elevated troponin - mild elevation is non-specific and not c/w ACS - no further eval indicated  Otherwise, per IM Active Problems:   Hypothyroidism    Hyperlipidemia   Essential hypertension   Cardiomyopathy, ischemic   Peripheral vascular disease (HCC)   Alcohol withdrawal (HCC)   PAF (paroxysmal atrial fibrillation) (HCC)   Hypokalemia   AKI (acute kidney injury) (HCC)   Postural dizziness with presyncope   Prolonged QT interval   Elevated troponin     For questions or updates, please contact [facility] Please consult [email or web address] for contact info under Cardiology/STEMI.   Signed, Theodore Demark, PA-C  03/07/2018 10:27 AM  Patient seen and examined, history and old chart reviewed.  Recent pacemaker strips are also reviewed.  The patient basically came in because his feet hurt and he had a slight increase in his weight gain.  He has not been having any chest pain.  He was severely hypokalemic on admission and this was repleted.  Potassium is up to 3.40 says he feels better now.  His pacemaker interrogation shows him to be close to elective replacement of battery life.  He was in  sinus rhythm and Optivol index is really not greatly elevated.  He has a trace amount of edema on examination.  His lungs were clear neck veins appeared elevated with V waves cardiovascular exam showed a 1 to 2/6 systolic murmur, no S3 there was very trace edema involving his feet with some mildly tender ankles and feet.  Pacemaker interrogation reviewed.  1.  Major issue is significant leg pain that is likely due to hypokalemia or other issues it is getting better 2.  Mildly volume overloaded but not severely elevated by Optivol 3.  Ischemic cardiomyopathy 4.  Previous aortic valve replacement 5.  Mild acute on chronic systolic heart failure 6.  Functioning defibrillator nearing elective battery replacement  Recommendations:  It is reasonable to give an additional dose of Lasix.  Need to careful to replete potassium.  Some compliance issues as an outpatient.  Apart from this I don't really think there is a lot else we need to do other than mild  diuresis.  Darden Palmer MD [facility] 12:07 PM

## 2018-03-07 NOTE — ED Notes (Signed)
Drink and sandwich bag given to pt. Pt states that he feels better. Watching TV. NAD. Will continue to monitor.

## 2018-03-07 NOTE — Progress Notes (Signed)
*  PRELIMINARY RESULTS* Echocardiogram 2D Echocardiogram has been performed.  Stacey DrainWhite, Caera Enwright J 03/07/2018, 5:25 PM

## 2018-03-08 DIAGNOSIS — Z789 Other specified health status: Secondary | ICD-10-CM | POA: Diagnosis not present

## 2018-03-08 DIAGNOSIS — I2510 Atherosclerotic heart disease of native coronary artery without angina pectoris: Secondary | ICD-10-CM | POA: Diagnosis present

## 2018-03-08 DIAGNOSIS — I712 Thoracic aortic aneurysm, without rupture: Secondary | ICD-10-CM | POA: Diagnosis present

## 2018-03-08 DIAGNOSIS — D72829 Elevated white blood cell count, unspecified: Secondary | ICD-10-CM | POA: Diagnosis present

## 2018-03-08 DIAGNOSIS — Z953 Presence of xenogenic heart valve: Secondary | ICD-10-CM | POA: Diagnosis not present

## 2018-03-08 DIAGNOSIS — I480 Paroxysmal atrial fibrillation: Secondary | ICD-10-CM | POA: Diagnosis present

## 2018-03-08 DIAGNOSIS — I6523 Occlusion and stenosis of bilateral carotid arteries: Secondary | ICD-10-CM | POA: Diagnosis present

## 2018-03-08 DIAGNOSIS — F10230 Alcohol dependence with withdrawal, uncomplicated: Secondary | ICD-10-CM | POA: Diagnosis not present

## 2018-03-08 DIAGNOSIS — R748 Abnormal levels of other serum enzymes: Secondary | ICD-10-CM | POA: Diagnosis not present

## 2018-03-08 DIAGNOSIS — I255 Ischemic cardiomyopathy: Secondary | ICD-10-CM

## 2018-03-08 DIAGNOSIS — I5043 Acute on chronic combined systolic (congestive) and diastolic (congestive) heart failure: Secondary | ICD-10-CM | POA: Diagnosis not present

## 2018-03-08 DIAGNOSIS — N179 Acute kidney failure, unspecified: Secondary | ICD-10-CM | POA: Diagnosis present

## 2018-03-08 DIAGNOSIS — N184 Chronic kidney disease, stage 4 (severe): Secondary | ICD-10-CM | POA: Diagnosis present

## 2018-03-08 DIAGNOSIS — F1010 Alcohol abuse, uncomplicated: Secondary | ICD-10-CM | POA: Diagnosis present

## 2018-03-08 DIAGNOSIS — D649 Anemia, unspecified: Secondary | ICD-10-CM | POA: Diagnosis present

## 2018-03-08 DIAGNOSIS — M1030 Gout due to renal impairment, unspecified site: Secondary | ICD-10-CM | POA: Diagnosis not present

## 2018-03-08 DIAGNOSIS — Z951 Presence of aortocoronary bypass graft: Secondary | ICD-10-CM | POA: Diagnosis not present

## 2018-03-08 DIAGNOSIS — E038 Other specified hypothyroidism: Secondary | ICD-10-CM | POA: Diagnosis present

## 2018-03-08 DIAGNOSIS — M109 Gout, unspecified: Secondary | ICD-10-CM | POA: Diagnosis present

## 2018-03-08 DIAGNOSIS — M1039 Gout due to renal impairment, multiple sites: Secondary | ICD-10-CM | POA: Diagnosis not present

## 2018-03-08 DIAGNOSIS — E876 Hypokalemia: Secondary | ICD-10-CM | POA: Diagnosis present

## 2018-03-08 DIAGNOSIS — I130 Hypertensive heart and chronic kidney disease with heart failure and stage 1 through stage 4 chronic kidney disease, or unspecified chronic kidney disease: Secondary | ICD-10-CM | POA: Diagnosis present

## 2018-03-08 DIAGNOSIS — T462X5A Adverse effect of other antidysrhythmic drugs, initial encounter: Secondary | ICD-10-CM | POA: Diagnosis present

## 2018-03-08 DIAGNOSIS — I739 Peripheral vascular disease, unspecified: Secondary | ICD-10-CM | POA: Diagnosis present

## 2018-03-08 DIAGNOSIS — T502X5A Adverse effect of carbonic-anhydrase inhibitors, benzothiadiazides and other diuretics, initial encounter: Secondary | ICD-10-CM | POA: Diagnosis present

## 2018-03-08 DIAGNOSIS — E871 Hypo-osmolality and hyponatremia: Secondary | ICD-10-CM | POA: Diagnosis not present

## 2018-03-08 DIAGNOSIS — E785 Hyperlipidemia, unspecified: Secondary | ICD-10-CM | POA: Diagnosis present

## 2018-03-08 DIAGNOSIS — E039 Hypothyroidism, unspecified: Secondary | ICD-10-CM | POA: Diagnosis present

## 2018-03-08 LAB — URINALYSIS, ROUTINE W REFLEX MICROSCOPIC
Bacteria, UA: NONE SEEN
Bilirubin Urine: NEGATIVE
GLUCOSE, UA: NEGATIVE mg/dL
KETONES UR: NEGATIVE mg/dL
Leukocytes, UA: NEGATIVE
NITRITE: NEGATIVE
PH: 6 (ref 5.0–8.0)
Protein, ur: NEGATIVE mg/dL
Specific Gravity, Urine: 1.012 (ref 1.005–1.030)
Squamous Epithelial / LPF: NONE SEEN

## 2018-03-08 LAB — T4, FREE: FREE T4: 0.5 ng/dL — AB (ref 0.61–1.12)

## 2018-03-08 LAB — BASIC METABOLIC PANEL
Anion gap: 12 (ref 5–15)
BUN: 44 mg/dL — ABNORMAL HIGH (ref 6–20)
CHLORIDE: 90 mmol/L — AB (ref 101–111)
CO2: 28 mmol/L (ref 22–32)
Calcium: 8.8 mg/dL — ABNORMAL LOW (ref 8.9–10.3)
Creatinine, Ser: 2.51 mg/dL — ABNORMAL HIGH (ref 0.61–1.24)
GFR calc Af Amer: 27 mL/min — ABNORMAL LOW (ref >60)
GFR, EST NON AFRICAN AMERICAN: 24 mL/min — AB (ref >60)
GLUCOSE: 124 mg/dL — AB (ref 65–99)
POTASSIUM: 3.2 mmol/L — AB (ref 3.5–5.1)
Sodium: 130 mmol/L — ABNORMAL LOW (ref 135–145)

## 2018-03-08 LAB — BRAIN NATRIURETIC PEPTIDE: B Natriuretic Peptide: 1000.1 pg/mL — ABNORMAL HIGH (ref 0.0–100.0)

## 2018-03-08 LAB — MAGNESIUM: Magnesium: 2.3 mg/dL (ref 1.7–2.4)

## 2018-03-08 MED ORDER — PREDNISONE 20 MG PO TABS
20.0000 mg | ORAL_TABLET | Freq: Once | ORAL | Status: AC
  Administered 2018-03-08: 20 mg via ORAL
  Filled 2018-03-08: qty 1

## 2018-03-08 MED ORDER — POTASSIUM CHLORIDE CRYS ER 20 MEQ PO TBCR
40.0000 meq | EXTENDED_RELEASE_TABLET | Freq: Once | ORAL | Status: AC
  Administered 2018-03-08: 40 meq via ORAL
  Filled 2018-03-08: qty 2

## 2018-03-08 MED ORDER — MUSCLE RUB 10-15 % EX CREA
TOPICAL_CREAM | CUTANEOUS | Status: DC | PRN
  Administered 2018-03-08 (×2): via TOPICAL
  Administered 2018-03-09: 1 via TOPICAL
  Filled 2018-03-08: qty 85

## 2018-03-08 MED ORDER — ENOXAPARIN SODIUM 30 MG/0.3ML ~~LOC~~ SOLN
30.0000 mg | SUBCUTANEOUS | Status: DC
  Administered 2018-03-09 – 2018-03-10 (×2): 30 mg via SUBCUTANEOUS
  Filled 2018-03-08 (×2): qty 0.3

## 2018-03-08 NOTE — Evaluation (Signed)
Occupational Therapy Evaluation Patient Details Name: [name] MRN: [id number] DOB: 03-04-1941 Today's Date: 03/08/2018    History of Present Illness 77 yo male with complaints of increased SOB, peripheral edema, weight gain, near syncopal episode, low K+ and Mg+ in the ED. PMH CHF, A-fib, cardiomyopathy, s/p CABG, AVR biprosthetic valve, ICD device placement    Clinical Impression   This 77 y/o M presents with the above. At baseline pt reports being mod independent with ADLs and functional mobility. Pt presenting with increased pain in LLE, generalized weakness and decreased standing balance. Pt completing sit<>stand from EOB at [facility] with MinA, though pt maintaining NWB through LLE in standing due to pain and not able to attempt further mobility during session; currently requires modA for LB ADLs. Pt will benefit from continued acute OT services and recommend SNF level services after discharge to maximize his overall safety and independence with ADLs and mobility prior to return home.     Follow Up Recommendations  SNF;Supervision/Assistance - 24 hour    Equipment Recommendations  Other (comment)(TBD in next venue )           Precautions / Restrictions Precautions Precautions: Fall Restrictions Weight Bearing Restrictions: No      Mobility Bed Mobility Overal bed mobility: Needs Assistance Bed Mobility: Supine to Sit;Sit to Supine     Supine to sit: Supervision Sit to supine: Supervision   General bed mobility comments: extended time and effort, cues for safety   Transfers Overall transfer level: Needs assistance Equipment used: Rolling walker (2 wheeled) Transfers: Sit to/from Stand Sit to Stand: Min assist;From elevated surface         General transfer comment: slightly elevated bed; minA to rise and steady with verbal cues for hand placement; increased time/effort     Balance Overall balance assessment: Needs assistance Sitting-balance support: Bilateral  upper extremity supported;Feet supported Sitting balance-Leahy Scale: Good     Standing balance support: Bilateral upper extremity supported;During functional activity Standing balance-Leahy Scale: Poor Standing balance comment: reliant on UE support                            ADL either performed or assessed with clinical judgement   ADL Overall ADL's : Needs assistance/impaired Eating/Feeding: Set up;Sitting   Grooming: Set up;Sitting   Upper Body Bathing: Min guard;Sitting   Lower Body Bathing: Minimal assistance;Sit to/from stand   Upper Body Dressing : Min guard;Sitting   Lower Body Dressing: Moderate assistance;Sit to/from stand       Toileting- Architect and Hygiene: Moderate assistance;Sit to/from stand       Functional mobility during ADLs: Minimal assistance;Rolling walker(for sit<>stand this session) General ADL Comments: Pt completing bed mobility and sit<>stand at RW during session; pt with increased pain in LLE with all mobility and does not put weight through LE when standing; unable to complete further mobility due to pain                         Pertinent Vitals/Pain Pain Assessment: Faces Faces Pain Scale: Hurts whole lot Pain Location: L LE  Pain Descriptors / Indicators: Aching;Sore Pain Intervention(s): Monitored during session;Premedicated before session          Extremity/Trunk Assessment Upper Extremity Assessment Upper Extremity Assessment: Generalized weakness   Lower Extremity Assessment Lower Extremity Assessment: Defer to PT evaluation   Cervical / Trunk Assessment Cervical / Trunk Assessment: Normal  Communication Communication Communication: No difficulties   Cognition Arousal/Alertness: Awake/alert Behavior During Therapy: WFL for tasks assessed/performed Overall Cognitive Status: Within Functional Limits for tasks assessed                                                       Home Living Family/patient expects to be discharged to:: Private residence Living Arrangements: Spouse/significant other Available Help at Discharge: Family   Home Access: Ramped entrance     Home Layout: One level         FirefighterBathroom Toilet: Standard     Home Equipment: Cane - single point          Prior Functioning/Environment Level of Independence: Independent with assistive device(s)        Comments: 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, 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         OT Problem List: Decreased strength;Decreased activity tolerance;Impaired vision/perception;Impaired balance (sitting and/or standing);Decreased knowledge of use of DME or AE;Pain      OT Treatment/Interventions: Self-care/ADL training;DME and/or AE instruction;Therapeutic activities;Balance training;Therapeutic exercise;Energy conservation;Patient/family education    OT Goals(Current goals can be found in the care plan section) Acute Rehab OT Goals Patient Stated Goal: to get better, go home  OT Goal Formulation: With patient Time For Goal Achievement: 03/22/18 Potential to Achieve Goals: Good  OT Frequency: Min 2X/week                             AM-PAC PT "6 Clicks" Daily Activity     Outcome Measure Help from another person eating meals?: None Help from another person taking care of personal grooming?: A Little Help from another person toileting, which includes using toliet, bedpan, or urinal?: A Lot Help from another person bathing (including washing, rinsing, drying)?: A Lot Help from another person to put on and taking off regular upper body clothing?: A Little Help from another person to put on and taking off regular lower body clothing?: A Lot 6 Click Score: 16   End of Session Equipment Utilized During Treatment: Gait belt;Rolling walker Nurse Communication: Mobility status  Activity Tolerance: Patient tolerated treatment well;Patient  limited by pain Patient left: in bed;with call bell/phone within reach  OT Visit Diagnosis: Muscle weakness (generalized) (M62.81);Other abnormalities of gait and mobility (R26.89);Pain Pain - Right/Left: Left Pain - part of body: Leg                Time: [id number] OT Time Calculation (min): 23 min Charges:  OT General Charges $OT Visit: 1 Visit OT Evaluation $OT Eval Moderate Complexity: 1 Mod G-Codes:     Marcy SirenBreanna Eleuterio Dollar, OT Pager [phone] 03/08/2018   Orlando PennerBreanna L Kellianne Ek 03/08/2018, 4:48 PM

## 2018-03-08 NOTE — Progress Notes (Addendum)
Initial Nutrition Assessment  DOCUMENTATION CODES:   Not applicable  INTERVENTION:   Recommend starting bowel regimen. Pt with Hx of constipation.  Snacks BID 10am - cheese and crackers 2pm - [place] sandwich  MVI  NUTRITION DIAGNOSIS:   Inadequate oral intake related to decreased appetite as evidenced by per patient/family report.  GOAL:   Patient will meet greater than or equal to 90% of their needs  MONITOR:   PO intake, Weight trends, Labs, I & O's  REASON FOR ASSESSMENT:   Consult Malnutrition Eval  ASSESSMENT:   77 y.o.malewith PMH of systolic diastolic CHF,  ischemic cardiomyopathy,s/p CABG,bilateral carotid stenoses s/p  Endarterectomy, s/p bioprosthetic AVR and AICDMedtronic protecta CRT-D device implanted in 2013,  and ETOH abuse. Presents with SOB, peripheral edema, weight gain, and possible syncopal episode. In the ED patient noted to have severe hypokalemia of 2.2 and a magnesium of 1.8; admitted for further management.  Pt reports not having a good appetite at time of visit but state PTA he had a good appetite and was eating well.   Pt has very poor dentition (no visible teeth). Pt does not wear dentures and denies that poor dentition affects his food intake.   Pt reports eating 75% of breakfast and 50% of lunch today.   Pt reports he is having some constipation but that this has been going on for ~186months. Pt and pt's daughter reports pt will take medication as needed for this. Intern explained to pt that constipation can decrease appetite.  Pt amenable to snacks 2x/day to increase calorie and protein intake during this admission.  Pt reports he weighs him self every morning and every night. UBW in the mornings 182-183lb UBW in the evening 184-185lb Pt feels he may have lost 5-6lb since 3/14. This could not be confirmed in the medical record. Per chart review, pt wt appears to be stable given potential fluctuations in fluid. Wt hx outlined  below.  Medications: Thiamine. KCl D/C after morning dose today Lasix x1 this morning.  Labs: Na (L;130), K(L;3.2), BUN (H;44), Cr(H;2.51) Mg WNL, BNP 1000.1 Corrected calcium WNL on 3/17 Albumin (L;3.4) on 3/17  NUTRITION - FOCUSED PHYSICAL EXAM:    Most Recent Value  Orbital Region  No depletion  Upper Arm Region  Mild depletion  Thoracic and Lumbar Region  No depletion  Buccal Region  No depletion  Temple Region  Mild depletion  Clavicle Bone Region  Mild depletion  Clavicle and Acromion Bone Region  Mild depletion  Scapular Bone Region  Moderate depletion  Dorsal Hand  Mild depletion  Patellar Region  Moderate depletion  Anterior Thigh Region  Moderate depletion  Posterior Calf Region  Mild depletion  Edema (RD Assessment)  None  Hair  Reviewed  Eyes  Reviewed  Mouth  Reviewed [poor dentition]  Skin  Reviewed  Nails  Reviewed [short, flat nails. 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 Suspect mild-moderate muscle loss due to advanced age and decreased mobility. Pt does have pain in his left knee currently; he ambulates with a walker PTA.  Diet Order:  Diet Heart Room service appropriate? Yes; Fluid consistency: Thin  EDUCATION NEEDS:   Education needs have been addressed  Skin:  Skin Assessment: Reviewed RN Assessment  Last BM:  3/16  Height:   Ht Readings from Last 1 Encounters:  03/07/18 6\' 1"  (1.854 m)    Weight:  3/17  187lb 8oz  Wt Readings from Last 15 Encounters:  03/08/18 181 lb 3.5 oz (82.2 kg)  01/29/18 188 lb (85.3 kg)  01/25/18 197 lb (89.4 kg)  11/19/17 188 lb (85.3 kg)  10/27/17 192 lb 6.4 oz (87.3 kg)  07/20/17 184 lb (83.5 kg)  04/15/17 180 lb (81.6 kg)  03/24/17 185 lb 12.8 oz (84.3 kg)  02/20/17 180 lb 3.2 oz (81.7 kg)  02/06/17 190 lb 3.2 oz (86.3 kg)  01/15/17 198 lb 12.8 oz (90.2 kg)  12/10/16 198 lb 3.2 oz (89.9 kg)  09/25/16 188 lb (85.3 kg)   06/26/16 188 lb 12.8 oz (85.6 kg)  01/29/16 185 lb 9.6 oz (84.2 kg)    Ideal Body Weight:  83.6 kg  BMI:  Body mass index is 23.91 kg/m.  Estimated Nutritional Needs:   Kcal:  1850-2050  Protein:  95-105g  Fluid:  >1.8L    Jahlia Omura, MS, Dietetic Intern Pager # [phone]

## 2018-03-08 NOTE — Clinical Social Work Note (Signed)
Clinical Social Work Assessment  Patient Details  Name: [name] MRN: [id number] Date of Birth: Mar 25, 1941  Date of referral:  03/08/18               Reason for consult:  Facility Placement, Discharge Planning                Permission sought to share information with:  Chartered certified accountant granted to share information::  Yes, Verbal Permission Granted  Name::        Agency::  SNF's  Relationship::     Contact Information:     Housing/Transportation Living arrangements for the past 2 months:  Single Family Home Source of Information:  Patient, Medical Team Patient Interpreter Needed:  None Criminal Activity/Legal Involvement Pertinent to Current Situation/Hospitalization:  No - Comment as needed Significant Relationships:  Adult Children, Spouse Lives with:  Spouse Do you feel safe going back to the place where you live?  Yes Need for family participation in patient care:  Yes (Comment)  Care giving concerns:  PT recommending SNF placement once medically stable for discharge.   Social Worker assessment / plan:  CSW met with patient. No supports at bedside. CSW introduced role and explained that PT recommendations would be discussed. Patient stated he will consider SNF placement but wants to discuss it with his daughter first. She is an Therapist, sports. SNF list and CSW contact card provided. Patient gave CSW permission to go ahead and send out referral. No further concerns. CSW encouraged patient to contact CSW as needed. CSW will continue to follow patient for support and facilitate discharge to SNF, if agreeable, once medically stable.  Employment status:  Retired Nurse, adult PT Recommendations:  [place] / Referral to community resources:  [place]  Patient/Family's Response to care:  Patient wants to discuss SNF with his daughter before making disposition decision. Patient's family  supportive and involved in patient's care. Patient appreciated social work intervention.  Patient/Family's Understanding of and Emotional Response to Diagnosis, Current Treatment, and Prognosis:  Patient has a good understanding of the reason for admission and his need for continued therapy after discharge. Patient appears happy with hospital care.  Emotional Assessment Appearance:  Appears stated age Attitude/Demeanor/Rapport:  Engaged, Gracious Affect (typically observed):  Accepting, Appropriate, Calm, Pleasant Orientation:  Oriented to Self, Oriented to Place, Oriented to  Time, Oriented to Situation Alcohol / Substance use:  Alcohol Use Psych involvement (Current and /or in the community):  No (Comment)  Discharge Needs  Concerns to be addressed:  Care Coordination Readmission within the last 30 days:  No Current discharge risk:  Dependent with Mobility Barriers to Discharge:  Continued Medical Work up, [place], LCSW 03/08/2018, 12:58 PM

## 2018-03-08 NOTE — Plan of Care (Signed)
  Progressing Education: Knowledge of General Education information will improve 03/08/2018 0115 - Progressing by Olive BassFutrell, Ryleigh Buenger E, RN Health Behavior/Discharge Planning: Ability to manage health-related needs will improve 03/08/2018 0115 - Progressing by Olive BassFutrell, Mourad Cwikla E, RN Clinical Measurements: Ability to maintain clinical measurements within normal limits will improve 03/08/2018 0115 - Progressing by Olive BassFutrell, Jashan Cotten E, RN Will remain free from infection 03/08/2018 0115 - Progressing by Olive BassFutrell, Tifini Reeder E, RN Diagnostic test results will improve 03/08/2018 0115 - Progressing by Olive BassFutrell, Nilton Lave E, RN Respiratory complications will improve 03/08/2018 0115 - Progressing by Olive BassFutrell, Kapil Petropoulos E, RN Cardiovascular complication will be avoided 03/08/2018 0115 - Progressing by Olive BassFutrell, Niyanna Asch E, RN Activity: Risk for activity intolerance will decrease 03/08/2018 0115 - Progressing by Olive BassFutrell, Maurita Havener E, RN Nutrition: Adequate nutrition will be maintained 03/08/2018 0115 - Progressing by Olive BassFutrell, Trystan Eads E, RN Coping: Level of anxiety will decrease 03/08/2018 0115 - Progressing by Olive BassFutrell, Yonael Tulloch E, RN Elimination: Will not experience complications related to bowel motility 03/08/2018 0115 - Progressing by Olive BassFutrell, Darcy Cordner E, RN Will not experience complications related to urinary retention 03/08/2018 0115 - Progressing by Olive BassFutrell, Brieanna Nau E, RN Pain Managment: General experience of comfort will improve 03/08/2018 0115 - Progressing by Olive BassFutrell, Joniya Boberg E, RN Safety: Ability to remain free from injury will improve 03/08/2018 0115 - Progressing by Olive BassFutrell, Trevionne Advani E, RN Skin Integrity: Risk for impaired skin integrity will decrease 03/08/2018 0115 - Progressing by Olive BassFutrell, Chesney Suares E, RN

## 2018-03-08 NOTE — Clinical Social Work Placement (Signed)
   CLINICAL SOCIAL WORK PLACEMENT  NOTE  Date:  03/08/2018  Patient Details  Name: [name] MRN: [id number] Date of Birth: Mar 10, 1941  Clinical Social Work is seeking post-discharge placement for this patient at the Skilled  Nursing Facility level of care (*CSW will initial, date and re-position this form in  chart as items are completed):  Yes   Patient/family provided with [place] Clinical Social Work Department's list of facilities offering this level of care within the geographic area requested by the patient (or if unable, by the patient's family).  Yes   Patient/family informed of their freedom to choose among providers that offer the needed level of care, that participate in Medicare, Medicaid or managed care program needed by the patient, have an available bed and are willing to accept the patient.  Yes   Patient/family informed of [facility]'s ownership interest in [facility] and [facility], as well as of the fact that they are under no obligation to receive care at these facilities.  PASRR submitted to EDS on 03/08/18     PASRR number received on       Existing PASRR number confirmed on 03/08/18     FL2 transmitted to all facilities in geographic area requested by pt/family on 03/08/18     FL2 transmitted to all facilities within larger geographic area on       Patient informed that his/her managed care company has contracts with or will negotiate with certain facilities, including the following:            Patient/family informed of bed offers received.  Patient chooses bed at       Physician recommends and patient chooses bed at      Patient to be transferred to   on  .  Patient to be transferred to facility by       Patient family notified on   of transfer.  Name of family member notified:        PHYSICIAN Please sign FL2     Additional Comment:    _______________________________________________ Margarito LinerSarah C Ayush Boulet, LCSW 03/08/2018,  1:01 PM

## 2018-03-08 NOTE — [place] FL2 (Signed)
[place] MEDICAID FL2 LEVEL OF CARE SCREENING TOOL     IDENTIFICATION  Patient Name: [name] Birthdate: [birth year] Sex: male Admission Date (Current Location): 03/06/2018  [facility] and [place] Number:  [employer] and Address:  [facility] [place], [address], [place], [place] [address]      Provider Number: [id number]  Attending Physician Name and Address:  Joseph Art, DO  Relative Name and Phone Number:       Current Level of Care: Hospital Recommended Level of Care: Skilled Nursing Facility Prior Approval Number:    Date Approved/Denied:   PASRR Number: [id number]  Discharge Plan: SNF    Current Diagnoses: Patient Active Problem List   Diagnosis Date Noted  . Prolonged QT interval 03/07/2018  . Elevated troponin 03/07/2018  . Hypokalemia 03/06/2018  . AKI (acute kidney injury) (HCC) 03/06/2018  . Postural dizziness with presyncope 03/06/2018  . Encounter for monitoring amiodarone therapy 01/25/2018  . Ascending aortic aneurysm (HCC) 01/25/2018  . Acute on chronic combined systolic and diastolic CHF (congestive heart failure) (HCC) 01/15/2017  . SA node dysfunction (HCC) 01/15/2017  . B12 deficiency anemia   . Esophageal reflux   . PAF (paroxysmal atrial fibrillation) (HCC)   . HLD (hyperlipidemia)   . Alcohol withdrawal (HCC) 01/31/2016  . Left-sided weakness 01/29/2016  . Neck pain 01/29/2016  . Clavicle fracture 01/29/2016  . H/O aortic valve replacement 06/04/2014  . 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 CRT-D [employer] 07/12/2012  . Hypothyroidism 03/14/2009  . Hyperlipidemia 03/14/2009  . GOUT 03/14/2009  . ALCOHOLISM 03/14/2009  . Essential hypertension 03/14/2009  . Cardiomyopathy, ischemic 03/14/2009  . CEREBROVASCULAR DISEASE 03/14/2009  . Peripheral vascular disease (HCC) 03/14/2009  . RENAL INSUFFICIENCY, CHRONIC 03/14/2009  . ANEMIA, HX OF 03/14/2009  . History of bilateral  carotid endarterectomy 03/14/2009  . CORONARY ARTERY BYPASS GRAFT, HX OF 03/14/2009  . CAD s/p redo CABG 01/17/2009  . ATRIAL FIBRILLATION 01/17/2009  . COMBINED HEART FAILURE, CHRONIC 01/17/2009    Orientation RESPIRATION BLADDER Height & Weight     Self, Time, Situation, Place  Normal Continent Weight: 181 lb 3.5 oz (82.2 kg) Height:  6\' 1"  (185.4 cm)  BEHAVIORAL SYMPTOMS/MOOD NEUROLOGICAL BOWEL NUTRITION STATUS  (None) (None) Continent Diet(Heart healthy)  AMBULATORY STATUS COMMUNICATION OF NEEDS Skin   Limited Assist Verbally Normal                       Personal Care Assistance Level of Assistance  Bathing, Feeding, Dressing Bathing Assistance: Limited assistance Feeding assistance: Limited assistance Dressing Assistance: Limited assistance     Functional Limitations Info  Sight, Hearing, Speech Sight Info: Adequate Hearing Info: Adequate Speech Info: Adequate    SPECIAL CARE FACTORS FREQUENCY  PT (By licensed PT), Blood pressure, OT (By licensed OT)     PT Frequency: 5 x week OT Frequency: 5 x week            Contractures Contractures Info: Not present    Additional Factors Info  Code Status, Allergies Code Status Info: Full Allergies Info: Atorvastatin, Other.           Current Medications (03/08/2018):  This is the current hospital active medication list Current Facility-Administered Medications  Medication Dose Route Frequency Provider Last Rate Last Dose  . acetaminophen (TYLENOL) tablet 650 mg  650 mg Oral Q6H PRN Therisa Doyne, MD       Or  . acetaminophen (TYLENOL) suppository 650  mg  650 mg Rectal Q6H PRN Doutova, Anastassia, MD      . amiodarone (PACERONE) tablet 200 mg  200 mg Oral Daily Doutova, Anastassia, MD   200 mg at 03/08/18 0834  . aspirin EC tablet 81 mg  81 mg Oral Daily Doutova, Anastassia, MD   81 mg at 03/08/18 0850  . atorvastatin (LIPITOR) tablet 20 mg  20 mg Oral q1800 Therisa Doyneoutova, Anastassia, MD   20 mg at 03/07/18  1619  . carvedilol (COREG) tablet 3.125 mg  3.125 mg Oral BID Therisa Doyneoutova, Anastassia, MD   3.125 mg at 03/08/18 0845  . colchicine tablet 0.6 mg  0.6 mg Oral BID Therisa Doyneoutova, Anastassia, MD   0.6 mg at 03/08/18 0834  . [START ON 03/09/2018] enoxaparin (LOVENOX) injection 30 mg  30 mg Subcutaneous Q24H Vann, Jessica U, DO      . HYDROcodone-acetaminophen (NORCO/VICODIN) 5-325 MG per tablet 1-2 tablet  1-2 tablet Oral Q4H PRN Therisa Doyneoutova, Anastassia, MD   2 tablet at 03/08/18 0828  . LORazepam (ATIVAN) injection 0-4 mg  0-4 mg Intravenous Q6H Couture, Cortni S, PA-C       Or  . LORazepam (ATIVAN) tablet 0-4 mg  0-4 mg Oral Q6H Couture, Cortni S, PA-C      . [START ON 03/09/2018] LORazepam (ATIVAN) injection 0-4 mg  0-4 mg Intravenous Q12H Couture, Cortni S, PA-C       Or  . [START ON 03/09/2018] LORazepam (ATIVAN) tablet 0-4 mg  0-4 mg Oral Q12H Couture, Cortni S, PA-C      . MEDLINE mouth rinse  15 mL Mouth Rinse BID Osei-Bonsu, Greggory StallionGeorge, MD   15 mL at 03/08/18 0846  . MUSCLE RUB CREA   Topical PRN Marlin CanaryVann, Jessica U, DO      . thiamine (VITAMIN B-1) tablet 100 mg  100 mg Oral Daily Couture, Cortni S, PA-C   100 mg at 03/08/18 [id number]     Discharge Medications: Please see discharge summary for a list of discharge medications.  Relevant Imaging Results:  Relevant Lab Results:   Additional Information SS#: [id number]  Margarito LinerSarah C Kalieb Freeland, LCSW

## 2018-03-08 NOTE — Evaluation (Signed)
Physical Therapy Evaluation Patient Details Name: [name] MRN: [id number] DOB: [birth year] Today's Date: 03/08/2018   History of Present Illness  77 yo male with complaints of increased SOB, peripheral edema, weight gain, near syncopal episode, low K+ and Mg+ in the ED. PMH CHF, A-fib, cardiomyopathy, s/p CABG, AVR biprosthetic valve, ICD device placement   Clinical Impression   Patient received in bed, very pleasant and willing to participate with skilled PT services this morning. He is able to complete functional bed mobility with S and extended time/effort, verbal cues for safety, but requires Mod-MaxA for functional transfers with RW, and also displays very antalgic gait pattern with RW today. Gait distance limited by pain. He was left sitting at edge of bed per his request with bed alarm activated, RN aware of patient status, all needs otherwise met. He will continue to benefit from skilled PT services in the acute setting, and will also likely benefit from ST-SNF moving forward, however may be able to transition to [place] pending progress.     Follow Up Recommendations SNF    Equipment Recommendations  Rolling walker with 5" wheels    Recommendations for Other Services       Precautions / Restrictions Precautions Precautions: Fall Restrictions Weight Bearing Restrictions: No      Mobility  Bed Mobility Overal bed mobility: Needs Assistance Bed Mobility: Supine to Sit     Supine to sit: Supervision     General bed mobility comments: extended time and effort, cues for safety   Transfers Overall transfer level: Needs assistance Equipment used: Rolling walker (2 wheeled) Transfers: Sit to/from Stand Sit to Stand: Mod assist;Max assist         General transfer comment: ModA on first attempt, MaxA on second attempt with cues for form and safety   Ambulation/Gait Ambulation/Gait assistance: Min guard Ambulation Distance (Feet): 30 Feet Assistive device:  Rolling walker (2 wheeled) Gait Pattern/deviations: Step-to pattern;Decreased step length - right;Decreased step length - left;Decreased stride length;Decreased stance time - left;Decreased weight shift to left;Trunk flexed;Antalgic     General Gait Details: antalgic pattern, gait speed very slow for age, highly reliant on UE support   Stairs            Wheelchair Mobility    Modified Rankin (Stroke Patients Only)       Balance Overall balance assessment: Needs assistance Sitting-balance support: Bilateral upper extremity supported;Feet supported Sitting balance-Leahy Scale: Good     Standing balance support: Bilateral upper extremity supported;During functional activity Standing balance-Leahy Scale: Fair Standing balance comment: heavily reliant on UE support                              Pertinent Vitals/Pain Pain Assessment: Faces Faces Pain Scale: Hurts little more Pain Location: B legs and feet, L hand and shoulder  Pain Descriptors / Indicators: Aching;Sore Pain Intervention(s): Limited activity within patient's tolerance;Monitored during session;Premedicated before session;Patient requesting pain meds-RN notified    Home Living Family/patient expects to be discharged to:: Private residence Living Arrangements: Spouse/significant other Available Help at Discharge: Family Type of Home: House Home Access: [place]: One [place]: [place] - single point      Prior Function Level of Independence: Independent with assistive device(s)         Comments: 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, 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  Hand Dominance        Extremity/Trunk Assessment   Upper Extremity Assessment Upper Extremity Assessment: Defer to OT evaluation    Lower Extremity Assessment Lower Extremity Assessment: Generalized weakness    Cervical / Trunk Assessment Cervical / Trunk  Assessment: Normal  Communication   Communication: No difficulties  Cognition Arousal/Alertness: Awake/alert Behavior During Therapy: WFL for tasks assessed/performed Overall Cognitive Status: Within Functional Limits for tasks assessed                                        General Comments      Exercises     Assessment/Plan    PT Assessment Patient needs continued PT services  PT Problem List Decreased strength;Decreased mobility;Decreased safety awareness;Decreased coordination;Decreased balance;Pain       PT Treatment Interventions DME instruction;Therapeutic activities;Gait training;Therapeutic exercise;Patient/family education;Stair training;Balance training;Functional mobility training;Neuromuscular re-education    PT Goals (Current goals can be found in the Care Plan section)  Acute Rehab PT Goals Patient Stated Goal: to get better, go home  PT Goal Formulation: With patient Time For Goal Achievement: 03/22/18 Potential to Achieve Goals: Fair    Frequency Min 3X/week   Barriers to discharge        Co-evaluation               AM-PAC PT "6 Clicks" Daily Activity  Outcome Measure Difficulty turning over in bed (including adjusting bedclothes, sheets and blankets)?: Unable Difficulty moving from lying on back to sitting on the side of the bed? : Unable Difficulty sitting down on and standing up from a chair with arms (e.g., wheelchair, bedside commode, etc,.)?: Unable Help needed moving to and from a bed to chair (including a wheelchair)?: A Little Help needed walking in hospital room?: A Little Help needed climbing 3-5 steps with a railing? : A Lot 6 Click Score: 11    End of Session Equipment Utilized During Treatment: Gait belt Activity Tolerance: Patient limited by pain Patient left: in bed;with call bell/phone within reach;with bed alarm set Nurse Communication: Mobility status;Other (comment)(educated on bag of tobacco that patient  is hiding in his room ) PT Visit Diagnosis: Unsteadiness on feet (R26.81);Muscle weakness (generalized) (M62.81);Difficulty in walking, not elsewhere classified (R26.2);Pain Pain - Right/Left: Left(bilateral) Pain - part of body: Ankle and joints of foot    Time: 1105-1135 PT Time Calculation (min) (ACUTE ONLY): 30 min   Charges:   PT Evaluation $PT Eval Moderate Complexity: 1 Mod PT Treatments $Gait Training: 8-22 mins   PT G Codes:        Deniece Ree PT, DPT, CBIS  Supplemental Physical Therapist [place]   Pager [phone]

## 2018-03-08 NOTE — Progress Notes (Signed)
DAILY PROGRESS NOTE   Patient Name: [name] Date of Encounter: 03/08/2018  Chief Complaint   Left hip pain  Patient Profile   [name] is a 77 y.o. 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  Subjective   Marked increase in creatinine overnight up to 2.5 - low sodium and chloride. OptiVol suggests only mild volume overload - BNP 1000. I's/O's not accurately recorded. Weight, however, is down 6 lbs since yesterday. Says he had received lasix - I see where it was ordered, but not given. Echo yesterday shows stable LVEF 30-35% (as compared to study in 10/2017).   Objective   Vitals:   03/07/18 2200 03/07/18 2347 03/08/18 0543 03/08/18 0845  BP:  112/65 132/72 (!) 106/52  Pulse: 61 62 72 60  Resp:  18 18   Temp:  98.2 F (36.8 C) 98.2 F (36.8 C)   TempSrc:  Oral Oral   SpO2:  97% 95%   Weight:   181 lb 3.5 oz (82.2 kg)   Height:        Intake/Output Summary (Last 24 hours) at 03/08/2018 0953 Last data filed at 03/08/2018 2957 Gross per 24 hour  Intake 940 ml  Output 700 ml  Net 240 ml   Filed Weights   03/06/18 1623 03/07/18 0930 03/08/18 0543  Weight: 188 lb (85.3 kg) 187 lb 8 oz (85 kg) 181 lb 3.5 oz (82.2 kg)    Physical Exam   General appearance: alert, appears stated age, no distress and thinn Neck: no carotid bruit, no JVD and thyroid not enlarged, symmetric, no tenderness/mass/nodules Lungs: diminished breath sounds bibasilar Heart: regular rate and rhythm Abdomen: soft, non-tender; bowel sounds normal; no masses,  no organomegaly Extremities: extremities normal, atraumatic, no cyanosis or edema Pulses: 2+ and symmetric Skin: Skin color, texture, turgor normal. No rashes or  lesions Neurologic: Grossly normal Psych: Pleasant  Inpatient Medications    Scheduled Meds: . amiodarone  200 mg Oral Daily  . aspirin EC  81 mg Oral Daily  . atorvastatin  20 mg Oral q1800  . carvedilol  3.125 mg Oral BID  . colchicine  0.6 mg Oral BID  . enoxaparin (LOVENOX) injection  40 mg Subcutaneous Q24H  . LORazepam  0-4 mg Intravenous Q6H   Or  . LORazepam  0-4 mg Oral Q6H  . [START ON 03/09/2018] LORazepam  0-4 mg Intravenous Q12H   Or  . [START ON 03/09/2018] LORazepam  0-4 mg Oral Q12H  . mouth rinse  15 mL Mouth Rinse BID  . thiamine  100 mg Oral Daily    Continuous Infusions:   PRN Meds: acetaminophen **OR** acetaminophen, HYDROcodone-acetaminophen   Labs   Results for orders placed or performed during the hospital encounter of 03/06/18 (from the past 48 hour(s))  CBC with Differential     Status: Abnormal   Collection Time: 03/06/18  5:05 PM  Result Value Ref Range   WBC 11.8 (H) 4.0 - 10.5 K/uL   RBC 4.16 (L) 4.22 - 5.81 MIL/uL   Hemoglobin 9.7 (L) 13.0 - 17.0 g/dL   HCT 32.5 (L) 39.0 - 52.0 %   MCV 78.1 78.0 - 100.0 fL  MCH 23.3 (L) 26.0 - 34.0 pg   MCHC 29.8 (L) 30.0 - 36.0 g/dL   RDW 18.5 (H) 11.5 - 15.5 %   Platelets 291 150 - 400 K/uL   Neutrophils Relative % 79 %   Neutro Abs 9.2 (H) 1.7 - 7.7 K/uL   Lymphocytes Relative 11 %   Lymphs Abs 1.3 0.7 - 4.0 K/uL   Monocytes Relative 10 %   Monocytes Absolute 1.2 (H) 0.1 - 1.0 K/uL   Eosinophils Relative 0 %   Eosinophils Absolute 0.0 0.0 - 0.7 K/uL   Basophils Relative 0 %   Basophils Absolute 0.0 0.0 - 0.1 K/uL    Comment: Performed at [facility] [address], [place], [place] [address]  Basic metabolic panel     Status: Abnormal   Collection Time: 03/06/18  5:05 PM  Result Value Ref Range   Sodium 135 135 - 145 mmol/L   Potassium 2.2 (LL) 3.5 - 5.1 mmol/L    Comment: CRITICAL RESULT CALLED TO, READ BACK BY AND VERIFIED WITH: Raynelle Bring RN AT 1738 03/06/18 BY WOOLLENK     Chloride 85 (L) 101 - 111 mmol/L   CO2 28 22 - 32 mmol/L   Glucose, Bld 106 (H) 65 - 99 mg/dL   BUN 33 (H) 6 - 20 mg/dL   Creatinine, Ser 1.88 (H) 0.61 - 1.24 mg/dL   Calcium 9.7 8.9 - 10.3 mg/dL   GFR calc non Af Amer 33 (L) >60 mL/min   GFR calc Af Amer 39 (L) >60 mL/min    Comment: (NOTE) The eGFR has been calculated using the CKD EPI equation. This calculation has not been validated in all clinical situations. eGFR's persistently <60 mL/min signify possible Chronic Kidney Disease.    Anion gap 22 (H) 5 - 15    Comment: Performed at [facility], [place] [address] [place], [place] [address]  Magnesium     Status: None   Collection Time: 03/06/18  7:05 PM  Result Value Ref Range   Magnesium 1.8 1.7 - 2.4 mg/dL    Comment: Performed at [facility], [place] [address], [place], [place] [address]  Lactic acid, plasma     Status: None   Collection Time: 03/06/18  8:11 PM  Result Value Ref Range   Lactic Acid, Venous 1.0 0.5 - 1.9 mmol/L    Comment: Performed at [facility] [address], [place], [place] [address]  Hepatic function panel     Status: Abnormal   Collection Time: 03/06/18  8:11 PM  Result Value Ref Range   Total Protein 8.3 (H) 6.5 - 8.1 g/dL   Albumin 3.6 3.5 - 5.0 g/dL   AST 22 15 - 41 U/L   ALT 10 (L) 17 - 63 U/L   Alkaline Phosphatase 56 38 - 126 U/L   Total Bilirubin 1.4 (H) 0.3 - 1.2 mg/dL   Bilirubin, Direct 0.3 0.1 - 0.5 mg/dL   Indirect Bilirubin 1.1 (H) 0.3 - 0.9 mg/dL    Comment: Performed at [facility] [address], [place], [place] [address]  CK     Status: None   Collection Time: 03/06/18  8:11 PM  Result Value Ref Range   Total CK 69 49 - 397 U/L    Comment: Performed at [facility], [place] [address] [place], [place] [address]  Phosphorus     Status: None   Collection Time: 03/06/18  9:33 PM  Result Value Ref Range   Phosphorus 3.7 2.5 - 4.6 mg/dL    Comment: Performed at [facility], [place] [address], [place], [place] [address]  Magnesium     Status: None   Collection Time: 03/06/18  9:33 PM  Result Value Ref Range   Magnesium 1.9 1.7 - 2.4 mg/dL    Comment: Performed at [facility], [place] [address] [place], [place] [address]  Troponin I (q 6hr x 3)     Status: Abnormal   Collection Time: 03/06/18  9:33 PM  Result Value Ref Range   Troponin I 0.05 (HH) <0.03 ng/mL    Comment: CRITICAL RESULT CALLED TO, READ BACK BY AND VERIFIED WITH: PETERS C,RN 03/06/18 2314 WAYK Performed at [facility], [place] [address] [place], [place] [address]   Protime-INR     Status: None   Collection Time: 03/06/18  9:33 PM  Result Value Ref Range   Prothrombin Time 14.7 11.4 - 15.2 seconds   INR 1.16     Comment: Performed at [facility] [address] [place], [place] [address]  Uric acid     Status: Abnormal   Collection Time: 03/06/18  9:33 PM  Result Value Ref Range   Uric Acid, Serum 13.4 (H) 4.4 - 7.6 mg/dL    Comment: Performed at [facility] [address], [place], [place] [address]  Urine rapid drug screen (hosp performed)     Status: Abnormal   Collection Time: 03/06/18  9:50 PM  Result Value Ref Range   Opiates POSITIVE (A) NONE DETECTED   Cocaine NONE DETECTED NONE DETECTED   Benzodiazepines NONE DETECTED NONE DETECTED   Amphetamines NONE DETECTED NONE DETECTED   Tetrahydrocannabinol NONE DETECTED NONE DETECTED   Barbiturates NONE DETECTED NONE DETECTED    Comment: (NOTE) DRUG SCREEN FOR MEDICAL PURPOSES ONLY.  IF CONFIRMATION IS NEEDED FOR ANY PURPOSE, NOTIFY LAB WITHIN 5 DAYS. LOWEST DETECTABLE LIMITS FOR URINE DRUG SCREEN Drug Class                     Cutoff (ng/mL) Amphetamine and metabolites    1000 Barbiturate and metabolites    200 Benzodiazepine                 616 Tricyclics and metabolites     300 Opiates and metabolites        300 Cocaine and metabolites        300 THC                            50 Performed at [facility],  [place] [address], [place], [place] [address]   Ethanol     Status: None   Collection Time: 03/06/18  9:50 PM  Result Value Ref Range   Alcohol, Ethyl (B) <10 <10 mg/dL    Comment:        LOWEST DETECTABLE LIMIT FOR SERUM ALCOHOL IS 10 mg/dL FOR MEDICAL PURPOSES ONLY Performed at [facility], [place] [address], [place], [place] [address]   I-Stat venous blood gas, ED     Status: Abnormal   Collection Time: 03/06/18 10:00 PM  Result Value Ref Range   pH, Ven 7.487 (H) 7.250 - 7.430   pCO2, Ven 47.0 44.0 - 60.0 mmHg   pO2, Ven 23.0 (LL) 32.0 - 45.0 mmHg   Bicarbonate 35.6 (H) 20.0 -  28.0 mmol/L   TCO2 37 (H) 22 - 32 mmol/L   O2 Saturation 44.0 %   Acid-Base Excess 11.0 (H) 0.0 - 2.0 mmol/L   Patient temperature HIDE    Sample type VENOUS    Comment NOTIFIED PHYSICIAN   Basic metabolic panel     Status: Abnormal   Collection Time: 03/06/18 10:40 PM  Result Value Ref Range   Sodium 133 (L) 135 - 145 mmol/L   Potassium 2.9 (L) 3.5 - 5.1 mmol/L   Chloride 86 (L) 101 - 111 mmol/L   CO2 25 22 - 32 mmol/L   Glucose, Bld 90 65 - 99 mg/dL   BUN 33 (H) 6 - 20 mg/dL   Creatinine, Ser 1.73 (H) 0.61 - 1.24 mg/dL   Calcium 9.4 8.9 - 10.3 mg/dL   GFR calc non Af Amer 37 (L) >60 mL/min   GFR calc Af Amer 43 (L) >60 mL/min    Comment: (NOTE) The eGFR has been calculated using the CKD EPI equation. This calculation has not been validated in all clinical situations. eGFR's persistently <60 mL/min signify possible Chronic Kidney Disease.    Anion gap 22 (H) 5 - 15    Comment: Performed at [facility], [place] [address], [place], [place] [address]  Troponin I (q 6hr x 3)     Status: Abnormal   Collection Time: 03/07/18  2:55 AM  Result Value Ref Range   Troponin I 0.05 (HH) <0.03 ng/mL    Comment: CRITICAL VALUE NOTED.  VALUE IS CONSISTENT WITH PREVIOUSLY REPORTED AND CALLED VALUE. Performed at [facility], [place] [address] [place], [place] [address]   Troponin I (q 6hr x 3)      Status: Abnormal   Collection Time: 03/07/18  9:13 AM  Result Value Ref Range   Troponin I 0.04 (HH) <0.03 ng/mL    Comment: CRITICAL VALUE NOTED.  VALUE IS CONSISTENT WITH PREVIOUSLY REPORTED AND CALLED VALUE. Performed at [facility], [place] [address], [place], [place] [address]   Magnesium     Status: None   Collection Time: 03/07/18  9:14 AM  Result Value Ref Range   Magnesium 2.2 1.7 - 2.4 mg/dL    Comment: Performed at [facility] [address] [place], [place] [address]  Phosphorus     Status: Abnormal   Collection Time: 03/07/18  9:14 AM  Result Value Ref Range   Phosphorus 2.4 (L) 2.5 - 4.6 mg/dL    Comment: Performed at [facility] [address] [place], [place] [address]  Comprehensive metabolic panel     Status: Abnormal   Collection Time: 03/07/18  9:14 AM  Result Value Ref Range   Sodium 131 (L) 135 - 145 mmol/L   Potassium 3.4 (L) 3.5 - 5.1 mmol/L   Chloride 90 (L) 101 - 111 mmol/L   CO2 29 22 - 32 mmol/L   Glucose, Bld 127 (H) 65 - 99 mg/dL   BUN 36 (H) 6 - 20 mg/dL   Creatinine, Ser 2.07 (H) 0.61 - 1.24 mg/dL   Calcium 9.0 8.9 - 10.3 mg/dL   Total Protein 7.9 6.5 - 8.1 g/dL   Albumin 3.4 (L) 3.5 - 5.0 g/dL   AST 23 15 - 41 U/L   ALT 10 (L) 17 - 63 U/L   Alkaline Phosphatase 54 38 - 126 U/L   Total Bilirubin 0.9 0.3 - 1.2 mg/dL   GFR calc non Af Amer 30 (  L) >60 mL/min   GFR calc Af Amer 34 (L) >60 mL/min    Comment: (NOTE) The eGFR has been calculated using the CKD EPI equation. This calculation has not been validated in all clinical situations. eGFR's persistently <60 mL/min signify possible Chronic Kidney Disease.    Anion gap 12 5 - 15    Comment: Performed at [facility] [address], [place], [place] [address]  CBC     Status: Abnormal   Collection Time: 03/07/18  9:14 AM  Result Value Ref Range   WBC 13.2 (H) 4.0 - 10.5 K/uL   RBC 3.63 (L) 4.22 - 5.81 MIL/uL   Hemoglobin 8.4 (L) 13.0 - 17.0 g/dL   HCT 28.3 (L)  39.0 - 52.0 %   MCV 78.0 78.0 - 100.0 fL   MCH 23.1 (L) 26.0 - 34.0 pg   MCHC 29.7 (L) 30.0 - 36.0 g/dL   RDW 18.3 (H) 11.5 - 15.5 %   Platelets 266 150 - 400 K/uL    Comment: Performed at [facility], [place] [address] [place], [place] [address]  TSH     Status: Abnormal   Collection Time: 03/07/18  9:28 AM  Result Value Ref Range   TSH 55.222 (H) 0.350 - 4.500 uIU/mL    Comment: Performed by a 3rd Generation assay with a functional sensitivity of <=0.01 uIU/mL. Performed at [facility], [place] [address] [place], [place] [address]   Basic metabolic panel     Status: Abnormal   Collection Time: 03/08/18  5:18 AM  Result Value Ref Range   Sodium 130 (L) 135 - 145 mmol/L   Potassium 3.2 (L) 3.5 - 5.1 mmol/L   Chloride 90 (L) 101 - 111 mmol/L   CO2 28 22 - 32 mmol/L   Glucose, Bld 124 (H) 65 - 99 mg/dL   BUN 44 (H) 6 - 20 mg/dL   Creatinine, Ser 2.51 (H) 0.61 - 1.24 mg/dL   Calcium 8.8 (L) 8.9 - 10.3 mg/dL   GFR calc non Af Amer 24 (L) >60 mL/min   GFR calc Af Amer 27 (L) >60 mL/min    Comment: (NOTE) The eGFR has been calculated using the CKD EPI equation. This calculation has not been validated in all clinical situations. eGFR's persistently <60 mL/min signify possible Chronic Kidney Disease.    Anion gap 12 5 - 15    Comment: Performed at [facility] [address], [place], [place] [address]  Brain natriuretic peptide     Status: Abnormal   Collection Time: 03/08/18  5:18 AM  Result Value Ref Range   B Natriuretic Peptide 1,000.1 (H) 0.0 - 100.0 pg/mL    Comment: Performed at [facility] [address] [place], [place] [address]  Magnesium     Status: None   Collection Time: 03/08/18  5:18 AM  Result Value Ref Range   Magnesium 2.3 1.7 - 2.4 mg/dL    Comment: Performed at [facility] [address] [place], [place] [address]    ECG   N/A- Personally Reviewed  Telemetry   Paced rhythm - Personally Reviewed  Radiology    Ct  Head Wo Contrast  Result Date: 03/06/2018 CLINICAL DATA:  77 year old presenting with acute onset of left-sided weakness and lower extremity numbness. EXAM: CT HEAD WITHOUT CONTRAST TECHNIQUE: Contiguous axial images were obtained from the base of the skull through the vertex without intravenous contrast. COMPARISON:  06/05/2017, 02/18/2016,  03/02/2013. FINDINGS: Brain: Slight head tilt in the gantry accounts for apparent asymmetry in the cerebral hemispheres. Moderate age related cortical and deep atrophy, unchanged dating back to 2014. Mild changes of small vessel disease of the white matter diffusely, progressive since 2014. Mild cerebellar atrophy, unchanged. No mass lesion. No midline shift. No acute hemorrhage or hematoma. No extra-axial fluid collections. No evidence of acute infarction. Vascular: Severe bilateral carotid siphon and vertebral artery atherosclerosis. No hyperdense vessel. Skull: No skull fracture or other focal osseous abnormality involving the skull. Sinuses/Orbits: Visualized paranasal sinuses, bilateral mastoid air cells and bilateral middle ear cavities well-aerated. Visualized orbits and globes normal in appearance. Other: None. IMPRESSION: 1. No acute intracranial abnormality. 2. Stable moderate age related generalized atrophy. Mild chronic microvascular ischemic changes of the white matter diffusely. Electronically Signed   By: Evangeline Dakin M.D.   On: 03/06/2018 19:07   Ct Angio Chest/abd/pel For Dissection W And/or Wo Contrast  Result Date: 03/06/2018 CLINICAL DATA:  77 year old with acute dizziness when standing yesterday while working, associated with generalized weakness. The generalized weakness persists today. Known ascending thoracic aortic aneurysm. EXAM: CT ANGIOGRAPHY CHEST, ABDOMEN AND PELVIS TECHNIQUE: Initially, multidetector CT imaging through the chest was performed. Subsequently, multidetector CT imaging through the chest, abdomen and pelvis was performed using  the standard protocol during bolus administration of intravenous contrast. Multiplanar reconstructed images and MIPs were obtained and reviewed to evaluate the vascular anatomy. CONTRAST:  75 mL ISOVUE-370 IOPAMIDOL INJECTION 76% IV. COMPARISON:  CTA chest 06/05/2017.  No prior abdominopelvic CTA. FINDINGS: CTA CHEST FINDINGS Cardiovascular: Prior sternotomy for CABG. Unenhanced images demonstrate calcified plaque throughout the thoracic aorta and involving the proximal great vessels. Severe three-vessel native coronary atherosclerosis is again demonstrated. Extensive mitral annular calcification. Enhanced images demonstrate severe thickening of the leaflets of the aortic valve, associated with moderate calcification. Heart moderately to markedly enlarged with left ventricular predominance, unchanged. Right subclavian transvenous pacemaker lead tips in the right atrial appendage and at the RV apex, unchanged. No pericardial effusion. Patent coronary grafts. The previously identified ascending thoracic aortic aneurysm measuring up to 5.1 cm diameter is unchanged since June, 2018. No evidence of aneurysm involving the arch or the descending thoracic aorta. Tortuosity and ectasia of the innominate artery is better seen on today's examination, maximum diameter 1.5 cm. Atherosclerosis at the origin of the left common carotid artery without evidence of hemodynamically significant stenosis. Severe atherosclerosis involving the remaining great vessels. No evidence of dissection. Mediastinum/Nodes: No pathologically enlarged mediastinal, hilar or axillary lymph nodes. No mediastinal masses. Normal-appearing esophagus. Subcentimeter nodules in the normal sized thyroid gland as noted previously. Lungs/Pleura: Expected dependent atelectasis posteriorly in the lower lobes. Lung parenchyma otherwise clear without evidence of airspace consolidation, interstitial disease, or parenchymal nodules or masses. Musculoskeletal: Mild  degenerative disc disease and spondylosis involving the thoracic spine. No acute abnormalities. Review of the MIP images confirms the above findings. CTA ABDOMEN AND PELVIS FINDINGS VASCULAR Aorta: Severe atherosclerosis with calcified and noncalcified plaque. No evidence of aneurysm. Celiac: Atherosclerosis at its origin without evidence of hemodynamically significant stenosis. Atherosclerosis at its bifurcation without stenosis. SMA: Atherosclerosis at its origin without evidence of hemodynamically significant stenosis. Atherosclerosis involving the descending portion of the SMA without stenosis. Renals: Single renal arteries bilaterally, each with extensive plaque at its origin, likely causing hemodynamically significant stenoses, especially on the left. IMA: Patent with atherosclerosis at its origin Inflow: Severe atherosclerosis involving the iliofemoral arterial system bilaterally without evidence of significant stenosis. Veins: Not evaluated.  Review of the MIP images confirms the above findings. NON-VASCULAR Hepatobiliary: Normal appearing liver for the early arterial phase of enhancement. Gallbladder normal in appearance without calcified gallstones. No biliary ductal dilation. Pancreas: Moderate atrophy. No pancreatic mass or peripancreatic inflammation. No pancreatic ductal dilation. Spleen: Normal in size and appearance. Adrenals/Urinary Tract: Normal appearing adrenal glands. Left renal atrophy with diffuse cortical thinning. Relatively well-preserved right renal cortex. Benign approximate 3 cm cyst arising from the lateral mid right kidney. Smaller benign cortical cysts in both kidneys. No urinary tract calculi. No hydronephrosis. Perinephric edema bilaterally. Mildly distended normal appearing urinary bladder. Stomach/Bowel: Stomach decompressed and normal in appearance. Normal-appearing small bowel. Mobile cecum present in the right upper quadrant. Large colonic stool burden. Sigmoid colon tortuous.  No visible colonic diverticula. Appendix not visible but no evidence of pericecal inflammation. Lymphatic: No pathologic lymphadenopathy. Reproductive: Mild prostate gland enlargement. Normal seminal vesicles. Other: None. Musculoskeletal: Severe degenerative disc disease at L5-S1 with a right posterolateral desiccated disc herniation and migration of the fragment behind S1. Severe facet degenerative changes throughout the lumbar spine. 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 Degenerative changes involving the sacroiliac joints. Review of the MIP images confirms the above findings. IMPRESSION: CTA Chest: 1. Stable approximate 5.1 cm ascending thoracic aortic aneurysm dating back to June, 2018. Recommend continued semi-annual imaging followup by CTA or MRA and referral to cardiothoracic surgery if not already obtained. This recommendation follows 2010 ACCF/AHA/AATS/ACR/ASA/SCA/SCAI/SIR/STS/SVM Guidelines for the Diagnosis and Management of Patients With Thoracic Aortic Disease. Circulation. 2010; 121: E081-K481. 2.  No acute cardiopulmonary disease. 3. Stable marked cardiomegaly with left ventricular enlargement. Prior CABG. Patent coronary grafts. 4. Marked thickening of the aortic valve leaflets associated with moderate valvular calcification. 5. 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 CTA Abdomen/Pelvis: 1. No evidence of abdominal aortic aneurysm. 2.  Aortic Atherosclerosis (ICD10-170.0) 3. Severe atherosclerosis involving the proximal renal arteries bilaterally, with likely high-grade stenoses, especially on the left. 4. Atherosclerotic though patent celiac artery, SMA and IMA. 5. Left renal atrophy. 6. No acute abnormalities involving the abdomen or pelvis. 7. 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 Desiccated disc herniation at L5-S1. Electronically Signed   By: Evangeline Dakin M.D.   On: 03/06/2018 19:32    Cardiac Studies   LV EF: 30% -    35%  ------------------------------------------------------------------- Indications:      Abnormal EKG 794.31.  ------------------------------------------------------------------- History:   PMH:  Elevated troponin, Prolonged QT interval. PAF (paroxysmal atrial fibrillation) Acquired from the patient and from the patient&'s chart.  PMH:  ALCOHOLISM. Cardiomyopathy, ischemic  Risk factors:  Hypertension. Dyslipidemia.  ------------------------------------------------------------------- Study Conclusions  - Left ventricle: The cavity size was mildly dilated. There was   mild concentric hypertrophy. Systolic function was moderately to   severely reduced. The estimated ejection fraction was in the   range of 30% to 35%. Diffuse hypokinesis worse in the   anterolateral, inferolateral, inferior, and apical myocardium.   Doppler parameters are consistent with a reversible restrictive   pattern, indicative of decreased left ventricular diastolic   compliance and/or increased left atrial pressure (grade 3   diastolic dysfunction). Doppler parameters are consistent with   high ventricular filling pressure. - Aortic valve: A bioprosthesis was present. Valve area (VTI): 2.02   cm^2. Valve area (Vmax): 2.02 cm^2. Valve area (Vmean): 1.95   cm^2. - Mitral valve: Transvalvular velocity was within the normal range.   There was no evidence for stenosis. There was trivial   regurgitation. - Left atrium:  The atrium was severely dilated. - Right ventricle: The cavity size was normal. Wall thickness was   normal. Systolic function was mildly reduced. - Right atrium: The atrium was moderately dilated. - Atrial septum: No defect or patent foramen ovale was identified   by color flow Doppler. - Tricuspid valve: There was mild regurgitation. - Pulmonary arteries: Systolic pressure was increased. PA peak   pressure: 44 mm Hg (S).  Assessment   1. Active Problems: 2.   Hypothyroidism 3.    Hyperlipidemia 4.   Essential hypertension 5.   Cardiomyopathy, ischemic 6.   Peripheral vascular disease ([place]) 7.   Alcohol withdrawal ([place]) 8.   PAF (paroxysmal atrial fibrillation) ([place]) 9.   Hypokalemia 10.   AKI (acute kidney injury) ([employer]) 11.   Postural dizziness with presyncope 12.   Prolonged QT interval 13.   Elevated troponin 14.   Plan   1. Noted to be mildly volume overloaded yesterday by OptiVol and exam - seems dry today. Weight down 6 lbs, ?accurate - he does not appears to have been given lasix. Creatinine markedly elevated - ?intrinsic renal failure. Would hold on diuretic for now - may need nephrology evaluation.  Time Spent Directly with Patient:  I have spent a total of 25 minutes with the patient reviewing hospital notes, telemetry, EKGs, labs and examining the patient as well as establishing an assessment and plan that was discussed personally with the patient. > 50% of time was spent in direct patient care.  Length of Stay:  LOS: 0 days   Pixie Casino, MD, [facility], [place] Director of the [facility]  [facility] Diplomate of the American Board of Clinical Lipidology Attending Cardiologist  Direct Dial: [phone]  Fax: [phone]  Website:  [email or web address] 03/08/2018, 9:53 AM

## 2018-03-08 NOTE — Progress Notes (Addendum)
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     Assessment & Plan:   Active Problems:   Hypothyroidism   Hyperlipidemia   Essential hypertension   Cardiomyopathy, ischemic   Peripheral vascular disease (HCC)   Alcohol withdrawal (HCC)   PAF (paroxysmal atrial fibrillation) (HCC)   Hypokalemia   AKI (acute kidney injury) (HCC)   Postural dizziness with presyncope   Prolonged QT interval   Elevated troponin    paroxysmal atrial fibrillation: CHA2DS2 vas score 4 No anticoagulation due to Risk of Falls/recurrent bleeding  Rate control with Coreg Continue amiodarone  Hypokalemia/hypomagnesemia Replete  Alcohol abuse -CIWA -no sign of withdrawal   Ischemic cardiomyopathy: Mild acute decompensation due to medication non-compliance -cardiology consult appreciated  Acute kidney injury on CKD -baseline appears to be 1.8ish -check urinalysis -avoid nephrotoxins/hypotension-- was taking ibuprofen prior to admission -was given lasix IV 40 mg on 3/18  Hyponatremia -? From over-diuresis -trend  Gout -improved since admission -on colchicine- may need to change to steroids or reduce to once daily the colchicine  Abnormal TSH -free t4 pending  Stable ascending thoracic aneurysm -outpatient follow up   DVT prophylaxis:  Lovenox   Code Status: Full  Code   Family Communication:   Disposition Plan:     Consultants:   cards    Subjective: Foot feels much better today-- less pain  Objective: Vitals:   03/07/18 2200 03/07/18 2347 03/08/18 0543 03/08/18 0845  BP:  112/65 132/72 (!) 106/52  Pulse: 61 62 72 60  Resp:  18 18   Temp:  98.2 F (36.8 C) 98.2 F (36.8 C)   TempSrc:  Oral Oral   SpO2:  97% 95%   Weight:   82.2 kg (181 lb 3.5 oz)   Height:        Intake/Output Summary (Last 24 hours) at 03/08/2018 1040 Last data filed at 03/08/2018 0953 Gross per 24 hour  Intake 940 ml  Output 925 ml  Net 15 ml   Filed Weights   03/06/18 1623 03/07/18 0930 03/08/18 0543  Weight: 85.3 kg (188 lb) 85 kg (187 lb 8 oz) 82.2 kg (181 lb 3.5 oz)    Examination:  General exam: Appears calm and comfortable  Respiratory system: diminished, no increased work of breathing Cardiovascular system: S1 & S2 heard, RRR. No JVD, murmurs, rubs, gallops or clicks. No pedal edema. Gastrointestinal system: Abdomen is nondistended, soft and nontender. No organomegaly or masses felt. Normal bowel sounds heard. Central nervous system: Alert and oriented. No focal neurological deficits. Extremities: Symmetric 5 x 5 power. Skin: No rashes, lesions or ulcers Psychiatry: Judgement and insight appear normal. Mood & affect appropriate.     Data Reviewed: I have personally reviewed following labs and imaging studies  CBC: Recent Labs  Lab 03/06/18 1705 03/07/18 0914  WBC 11.8* 13.2*  NEUTROABS 9.2*  --   HGB 9.7* 8.4*  HCT 32.5* 28.3*  MCV 78.1 78.0  PLT 291 266   Basic Metabolic  Panel: Recent Labs  Lab 03/06/18 1705 03/06/18 1905 03/06/18 2133 03/06/18 2240 03/07/18 0914 03/08/18 0518  NA 135  --   --  133* 131* 130*  K 2.2*  --   --  2.9* 3.4* 3.2*  CL 85*  --   --  86* 90* 90*  CO2 28  --   --  25 29 28   GLUCOSE 106*  --   --  90 127* 124*  BUN 33*  --   --  33* 36* 44*  CREATININE 1.88*  --   --  1.73* 2.07* 2.51*   CALCIUM 9.7  --   --  9.4 9.0 8.8*  MG  --  1.8 1.9  --  2.2 2.3  PHOS  --   --  3.7  --  2.4*  --    GFR: Estimated Creatinine Clearance: 28.7 mL/min (A) (by C-G formula based on SCr of 2.51 mg/dL (H)). Liver Function Tests: Recent Labs  Lab 03/06/18 2011 03/07/18 0914  AST 22 23  ALT 10* 10*  ALKPHOS 56 54  BILITOT 1.4* 0.9  PROT 8.3* 7.9  ALBUMIN 3.6 3.4*   No results for input(s): LIPASE, AMYLASE in the last 168 hours. No results for input(s): AMMONIA in the last 168 hours. Coagulation Profile: Recent Labs  Lab 03/06/18 2133  INR 1.16   Cardiac Enzymes: Recent Labs  Lab 03/06/18 2011 03/06/18 2133 03/07/18 0255 03/07/18 0913  CKTOTAL 69  --   --   --   TROPONINI  --  0.05* 0.05* 0.04*   BNP (last 3 results) Recent Labs    01/28/18 0954  PROBNP 3,838*   HbA1C: No results for input(s): HGBA1C in the last 72 hours. CBG: No results for input(s): GLUCAP in the last 168 hours. Lipid Profile: No results for input(s): CHOL, HDL, LDLCALC, TRIG, CHOLHDL, LDLDIRECT in the last 72 hours. Thyroid Function Tests: Recent Labs    03/07/18 0928  TSH 55.222*   Anemia Panel: No results for input(s): VITAMINB12, FOLATE, FERRITIN, TIBC, IRON, RETICCTPCT in the last 72 hours. Urine analysis:    Component Value Date/Time   COLORURINE STRAW (A) 06/05/2017 1352   APPEARANCEUR CLEAR 06/05/2017 1352   LABSPEC 1.006 06/05/2017 1352   PHURINE 7.0 06/05/2017 1352   GLUCOSEU NEGATIVE 06/05/2017 1352   HGBUR SMALL (A) 06/05/2017 1352   BILIRUBINUR NEGATIVE 06/05/2017 1352   KETONESUR NEGATIVE 06/05/2017 1352   PROTEINUR NEGATIVE 06/05/2017 1352   UROBILINOGEN 0.2 03/02/2013 1646   NITRITE NEGATIVE 06/05/2017 1352   LEUKOCYTESUR NEGATIVE 06/05/2017 1352     )No results found for this or any previous visit (from the past 240 hour(s)).    Anti-infectives (From admission, onward)   None       Radiology Studies: Ct Head Wo Contrast  Result Date:  03/06/2018 CLINICAL DATA:  77 year old presenting with acute onset of left-sided weakness and lower extremity numbness. EXAM: CT HEAD WITHOUT CONTRAST TECHNIQUE: Contiguous axial images were obtained from the base of the skull through the vertex without intravenous contrast. COMPARISON:  06/05/2017, 02/18/2016, 03/02/2013. FINDINGS: Brain: Slight head tilt in the gantry accounts for apparent asymmetry in the cerebral hemispheres. Moderate age related cortical and deep atrophy, unchanged dating back to 2014. Mild changes of small vessel disease of the white matter diffusely, progressive since 2014. Mild cerebellar atrophy, unchanged. No mass lesion. No midline shift. No acute hemorrhage or hematoma. No extra-axial fluid collections. No evidence of acute infarction. Vascular: Severe bilateral carotid siphon and vertebral artery atherosclerosis.  No hyperdense vessel. Skull: No skull fracture or other focal osseous abnormality involving the skull. Sinuses/Orbits: Visualized paranasal sinuses, bilateral mastoid air cells and bilateral middle ear cavities well-aerated. Visualized orbits and globes normal in appearance. Other: None. IMPRESSION: 1. No acute intracranial abnormality. 2. Stable moderate age related generalized atrophy. Mild chronic microvascular ischemic changes of the white matter diffusely. Electronically Signed   By: Hulan Saashomas  Lawrence M.D.   On: 03/06/2018 19:07   Ct Angio Chest/abd/pel For Dissection W And/or Wo Contrast  Result Date: 03/06/2018 CLINICAL DATA:  77 year old with acute dizziness when standing yesterday while working, associated with generalized weakness. The generalized weakness persists today. Known ascending thoracic aortic aneurysm. EXAM: CT ANGIOGRAPHY CHEST, ABDOMEN AND PELVIS TECHNIQUE: Initially, multidetector CT imaging through the chest was performed. Subsequently, multidetector CT imaging through the chest, abdomen and pelvis was performed using the standard protocol during  bolus administration of intravenous contrast. Multiplanar reconstructed images and MIPs were obtained and reviewed to evaluate the vascular anatomy. CONTRAST:  75 mL [id number] IOPAMIDOL INJECTION 76% IV. COMPARISON:  CTA chest 06/05/2017.  No prior abdominopelvic CTA. FINDINGS: CTA CHEST FINDINGS Cardiovascular: Prior sternotomy for CABG. Unenhanced images demonstrate calcified plaque throughout the thoracic aorta and involving the proximal great vessels. Severe three-vessel native coronary atherosclerosis is again demonstrated. Extensive mitral annular calcification. Enhanced images demonstrate severe thickening of the leaflets of the aortic valve, associated with moderate calcification. Heart moderately to markedly enlarged with left ventricular predominance, unchanged. Right subclavian transvenous pacemaker lead tips in the right atrial appendage and at the RV apex, unchanged. No pericardial effusion. Patent coronary grafts. The previously identified ascending thoracic aortic aneurysm measuring up to 5.1 cm diameter is unchanged since June, 2018. No evidence of aneurysm involving the arch or the descending thoracic aorta. Tortuosity and ectasia of the innominate artery is better seen on today's examination, maximum diameter 1.5 cm. Atherosclerosis at the origin of the left common carotid artery without evidence of hemodynamically significant stenosis. Severe atherosclerosis involving the remaining great vessels. No evidence of dissection. Mediastinum/Nodes: No pathologically enlarged mediastinal, hilar or axillary lymph nodes. No mediastinal masses. Normal-appearing esophagus. Subcentimeter nodules in the normal sized thyroid gland as noted previously. Lungs/Pleura: Expected dependent atelectasis posteriorly in the lower lobes. Lung parenchyma otherwise clear without evidence of airspace consolidation, interstitial disease, or parenchymal nodules or masses. Musculoskeletal: Mild degenerative disc disease and  spondylosis involving the thoracic spine. No acute abnormalities. Review of the MIP images confirms the above findings. CTA ABDOMEN AND PELVIS FINDINGS VASCULAR Aorta: Severe atherosclerosis with calcified and noncalcified plaque. No evidence of aneurysm. Celiac: Atherosclerosis at its origin without evidence of hemodynamically significant stenosis. Atherosclerosis at its bifurcation without stenosis. SMA: Atherosclerosis at its origin without evidence of hemodynamically significant stenosis. Atherosclerosis involving the descending portion of the SMA without stenosis. Renals: Single renal arteries bilaterally, each with extensive plaque at its origin, likely causing hemodynamically significant stenoses, especially on the left. IMA: Patent with atherosclerosis at its origin Inflow: Severe atherosclerosis involving the iliofemoral arterial system bilaterally without evidence of significant stenosis. Veins: Not evaluated. Review of the MIP images confirms the above findings. NON-VASCULAR Hepatobiliary: Normal appearing liver for the early arterial phase of enhancement. Gallbladder normal in appearance without calcified gallstones. No biliary ductal dilation. Pancreas: Moderate atrophy. No pancreatic mass or peripancreatic inflammation. No pancreatic ductal dilation. Spleen: Normal in size and appearance. Adrenals/Urinary Tract: Normal appearing adrenal glands. Left renal atrophy with diffuse cortical thinning. Relatively well-preserved right renal cortex. Benign approximate 3 cm cyst arising from  the lateral mid right kidney. Smaller benign cortical cysts in both kidneys. No urinary tract calculi. No hydronephrosis. Perinephric edema bilaterally. Mildly distended normal appearing urinary bladder. Stomach/Bowel: Stomach decompressed and normal in appearance. Normal-appearing small bowel. Mobile cecum present in the right upper quadrant. Large colonic stool burden. Sigmoid colon tortuous. No visible colonic diverticula.  Appendix not visible but no evidence of pericecal inflammation. Lymphatic: No pathologic lymphadenopathy. Reproductive: Mild prostate gland enlargement. Normal seminal vesicles. Other: None. Musculoskeletal: Severe degenerative disc disease at L5-S1 with a right posterolateral desiccated disc herniation and migration of the fragment behind S1. Severe facet degenerative changes throughout the lumbar spine. 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 Degenerative changes involving the sacroiliac joints. Review of the MIP images confirms the above findings. IMPRESSION: CTA Chest: 1. Stable approximate 5.1 cm ascending thoracic aortic aneurysm dating back to June, 2018. Recommend continued semi-annual imaging followup by CTA or MRA and referral to cardiothoracic surgery if not already obtained. This recommendation follows 2010 ACCF/AHA/AATS/ACR/ASA/SCA/SCAI/SIR/STS/SVM Guidelines for the Diagnosis and Management of Patients With Thoracic Aortic Disease. Circulation. 2010; 121: G956-O130. 2.  No acute cardiopulmonary disease. 3. Stable marked cardiomegaly with left ventricular enlargement. Prior CABG. Patent coronary grafts. 4. Marked thickening of the aortic valve leaflets associated with moderate valvular calcification. 5. 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 CTA Abdomen/Pelvis: 1. No evidence of abdominal aortic aneurysm. 2.  Aortic Atherosclerosis (ICD10-170.0) 3. Severe atherosclerosis involving the proximal renal arteries bilaterally, with likely high-grade stenoses, especially on the left. 4. Atherosclerotic though patent celiac artery, SMA and IMA. 5. Left renal atrophy. 6. No acute abnormalities involving the abdomen or pelvis. 7. 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 Desiccated disc herniation at L5-S1. Electronically Signed   By: Hulan Saas M.D.   On: 03/06/2018 19:32        Scheduled Meds: . amiodarone  200 mg Oral Daily  . aspirin EC  81 mg Oral Daily  .  atorvastatin  20 mg Oral q1800  . carvedilol  3.125 mg Oral BID  . colchicine  0.6 mg Oral BID  . enoxaparin (LOVENOX) injection  40 mg Subcutaneous Q24H  . LORazepam  0-4 mg Intravenous Q6H   Or  . LORazepam  0-4 mg Oral Q6H  . [START ON 03/09/2018] LORazepam  0-4 mg Intravenous Q12H   Or  . [START ON 03/09/2018] LORazepam  0-4 mg Oral Q12H  . mouth rinse  15 mL Mouth Rinse BID  . thiamine  100 mg Oral Daily   Continuous Infusions:   LOS: 0 days    Time spent: 25 min    Joseph Art, DO [facility] Pager [phone]  If 7PM-7AM, please contact night-coverage [email or web address] Password [email or web address] 03/08/2018, 10:40 AM

## 2018-03-09 DIAGNOSIS — E039 Hypothyroidism, unspecified: Secondary | ICD-10-CM

## 2018-03-09 DIAGNOSIS — F10230 Alcohol dependence with withdrawal, uncomplicated: Secondary | ICD-10-CM

## 2018-03-09 DIAGNOSIS — M1039 Gout due to renal impairment, multiple sites: Secondary | ICD-10-CM

## 2018-03-09 LAB — BASIC METABOLIC PANEL
ANION GAP: 15 (ref 5–15)
BUN: 48 mg/dL — AB (ref 6–20)
CALCIUM: 9 mg/dL (ref 8.9–10.3)
CO2: 26 mmol/L (ref 22–32)
Chloride: 89 mmol/L — ABNORMAL LOW (ref 101–111)
Creatinine, Ser: 2.37 mg/dL — ABNORMAL HIGH (ref 0.61–1.24)
GFR calc Af Amer: 29 mL/min — ABNORMAL LOW (ref >60)
GFR calc non Af Amer: 25 mL/min — ABNORMAL LOW (ref >60)
GLUCOSE: 137 mg/dL — AB (ref 65–99)
Potassium: 3.8 mmol/L (ref 3.5–5.1)
Sodium: 130 mmol/L — ABNORMAL LOW (ref 135–145)

## 2018-03-09 LAB — CBC
HEMATOCRIT: 25.9 % — AB (ref 39.0–52.0)
Hemoglobin: 7.8 g/dL — ABNORMAL LOW (ref 13.0–17.0)
MCH: 23.9 pg — AB (ref 26.0–34.0)
MCHC: 30.1 g/dL (ref 30.0–36.0)
MCV: 79.2 fL (ref 78.0–100.0)
Platelets: 282 10*3/uL (ref 150–400)
RBC: 3.27 MIL/uL — ABNORMAL LOW (ref 4.22–5.81)
RDW: 18.9 % — AB (ref 11.5–15.5)
WBC: 9.4 10*3/uL (ref 4.0–10.5)

## 2018-03-09 LAB — OCCULT BLOOD X 1 CARD TO LAB, STOOL: Fecal Occult Bld: NEGATIVE

## 2018-03-09 MED ORDER — PREDNISONE 20 MG PO TABS
20.0000 mg | ORAL_TABLET | Freq: Every day | ORAL | Status: DC
  Administered 2018-03-09 – 2018-03-10 (×2): 20 mg via ORAL
  Filled 2018-03-09 (×2): qty 1

## 2018-03-09 MED ORDER — MAGNESIUM CITRATE PO SOLN
1.0000 | Freq: Once | ORAL | Status: AC
  Administered 2018-03-09: 1 via ORAL
  Filled 2018-03-09: qty 296

## 2018-03-09 MED ORDER — BISACODYL 10 MG RE SUPP
10.0000 mg | Freq: Once | RECTAL | Status: AC
  Administered 2018-03-09: 10 mg via RECTAL
  Filled 2018-03-09: qty 1

## 2018-03-09 MED ORDER — LEVOTHYROXINE SODIUM 25 MCG PO TABS
25.0000 ug | ORAL_TABLET | Freq: Every day | ORAL | Status: DC
  Administered 2018-03-10: 25 ug via ORAL
  Filled 2018-03-09: qty 1

## 2018-03-09 MED ORDER — SENNOSIDES-DOCUSATE SODIUM 8.6-50 MG PO TABS: 1.0000 | ORAL_TABLET | Freq: Two times a day (BID) | ORAL | Status: DC

## 2018-03-09 MED ORDER — POTASSIUM CHLORIDE CRYS ER 10 MEQ PO TBCR
10.0000 meq | EXTENDED_RELEASE_TABLET | Freq: Every day | ORAL | Status: DC
  Administered 2018-03-10: 10 meq via ORAL
  Filled 2018-03-09 (×2): qty 1

## 2018-03-09 MED ORDER — FUROSEMIDE 40 MG PO TABS
40.0000 mg | ORAL_TABLET | Freq: Every day | ORAL | Status: DC
  Administered 2018-03-10: 40 mg via ORAL
  Filled 2018-03-09: qty 1

## 2018-03-09 NOTE — Clinical Social Work Note (Signed)
Per RN in morning progression, patient has decided to return home at discharge. CSW met with patient and confirmed. Patient began moving his legs up and down to show he is feeling better. CSW asked about home health. Patient stated his daughter is an Therapist, sports with [place] and they plan to provide those services. Will notify RNCM. CSW provided bed offers in case plans change. Will continue to follow.  Dayton Scrape, [place]

## 2018-03-09 NOTE — Progress Notes (Signed)
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     Assessment & Plan:   Active Problems:   Hypothyroidism   Hyperlipidemia   Essential hypertension   Cardiomyopathy, ischemic   Peripheral vascular disease (HCC)   Alcohol withdrawal (HCC)   PAF (paroxysmal atrial fibrillation) (HCC)   Hypokalemia   AKI (acute kidney injury) (HCC)   Postural dizziness with presyncope   Prolonged QT interval   Elevated troponin    paroxysmal atrial fibrillation: CHA2DS2 vas score 4 No anticoagulation due to Risk of Falls/recurrent bleeding  Rate control with Coreg Holding amiodarone  Hypokalemia/hypomagnesemia Replete  Alcohol abuse -CIWA -no sign of withdrawal   Ischemic cardiomyopathy: Mild acute decompensation due to medication non-compliance -cardiology consult appreciated- lasix dose changed  Acute kidney injury on CKD -baseline appears to be 1.8ish -urinalysis clear -avoid nephrotoxins/hypotension-- was taking ibuprofen prior to admission and got a CT with contrast in ER -was given lasix IV 40 mg on 3/18 -BMP in AM  Anemia -? From CD -B12/iron panel pending -heme test stools  Hyponatremia -? From over-diuresis vs alcohol -trend  Gout -improved since admission -gave steroids with marked improvement-- will continue  Hypothyroidism -start low dose  synthroid -amiodarone stopped -will need close outpatient monitoring  Stable ascending thoracic aneurysm -outpatient follow up  Long discussion with patient regarding need for PCP.  He has seen Dr. Lysbeth [name] in past.  If he does not want to go back there, can go to [facility] in [place] (Dr. Lavonna [name] etc)  DVT prophylaxis:  Lovenox   Code Status: Full Code   Family Communication: Daughter on phone  Disposition Plan:  Home 24-48 hours   Consultants:   cards    Subjective: Able to walk better today, less pain in leg  Objective: Vitals:   03/09/18 0455 03/09/18 0459 03/09/18 0933 03/09/18 1107  BP:   (!) 116/57 117/62  Pulse:   (!) 58 (!) 56  Resp:    14  Temp: 98.9 F (37.2 C)   97.7 F (36.5 C)  TempSrc: Oral   Oral  SpO2:  95% 98% 99%  Weight:  81.7 kg (180 lb 1.6 oz)    Height:        Intake/Output Summary (Last 24 hours) at 03/09/2018 1317 Last data filed at 03/09/2018 1200 Gross per 24 hour  Intake 660 ml  Output 1100 ml  Net -440 ml   Filed Weights   03/07/18 0930 03/08/18 0543 03/09/18 0459  Weight: 85 kg (187 lb 8 oz) 82.2 kg (181 lb 3.5 oz) 81.7 kg (180 lb 1.6 oz)    Examination:  General exam: In bed, NAD Respiratory system: no wheezing Cardiovascular system: rrr Gastrointestinal system: +BS, soft Central nervous system: alert- pleasant and cooperative Extremities: moves all 4 ext     Data Reviewed: I have personally reviewed following labs and imaging studies  CBC: Recent Labs  Lab 03/06/18 1705 03/07/18 0914 03/09/18 0521  WBC 11.8* 13.2* 9.4  NEUTROABS 9.2*  --   --  HGB 9.7* 8.4* 7.8*  HCT 32.5* 28.3* 25.9*  MCV 78.1 78.0 79.2  PLT 291 266 282   Basic Metabolic Panel: Recent Labs  Lab 03/06/18 1705 03/06/18 1905 03/06/18 2133 03/06/18 2240 03/07/18 0914 03/08/18 0518 03/09/18 0521  NA 135  --   --  133* 131* 130* 130*  K 2.2*  --   --  2.9* 3.4* 3.2* 3.8  CL 85*  --   --  86* 90* 90* 89*  CO2 28  --    --  25 29 28 26   GLUCOSE 106*  --   --  90 127* 124* 137*  BUN 33*  --   --  33* 36* 44* 48*  CREATININE 1.88*  --   --  1.73* 2.07* 2.51* 2.37*  CALCIUM 9.7  --   --  9.4 9.0 8.8* 9.0  MG  --  1.8 1.9  --  2.2 2.3  --   PHOS  --   --  3.7  --  2.4*  --   --    GFR: Estimated Creatinine Clearance: 30.4 mL/min (A) (by C-G formula based on SCr of 2.37 mg/dL (H)). Liver Function Tests: Recent Labs  Lab 03/06/18 2011 03/07/18 0914  AST 22 23  ALT 10* 10*  ALKPHOS 56 54  BILITOT 1.4* 0.9  PROT 8.3* 7.9  ALBUMIN 3.6 3.4*   No results for input(s): LIPASE, AMYLASE in the last 168 hours. No results for input(s): AMMONIA in the last 168 hours. Coagulation Profile: Recent Labs  Lab 03/06/18 2133  INR 1.16   Cardiac Enzymes: Recent Labs  Lab 03/06/18 2011 03/06/18 2133 03/07/18 0255 03/07/18 0913  CKTOTAL 69  --   --   --   TROPONINI  --  0.05* 0.05* 0.04*   BNP (last 3 results) Recent Labs    01/28/18 0954  PROBNP 3,838*   HbA1C: No results for input(s): HGBA1C in the last 72 hours. CBG: No results for input(s): GLUCAP in the last 168 hours. Lipid Profile: No results for input(s): CHOL, HDL, LDLCALC, TRIG, CHOLHDL, LDLDIRECT in the last 72 hours. Thyroid Function Tests: Recent Labs    03/07/18 0928 03/08/18 0906  TSH 55.222*  --   FREET4  --  0.50*   Anemia Panel: No results for input(s): VITAMINB12, FOLATE, FERRITIN, TIBC, IRON, RETICCTPCT in the last 72 hours. Urine analysis:    Component Value Date/Time   COLORURINE YELLOW 03/08/2018 1832   APPEARANCEUR CLEAR 03/08/2018 1832   LABSPEC 1.012 03/08/2018 1832   PHURINE 6.0 03/08/2018 1832   GLUCOSEU NEGATIVE 03/08/2018 1832   HGBUR SMALL (A) 03/08/2018 1832   BILIRUBINUR NEGATIVE 03/08/2018 1832   KETONESUR NEGATIVE 03/08/2018 1832   PROTEINUR NEGATIVE 03/08/2018 1832   UROBILINOGEN 0.2 03/02/2013 1646   NITRITE NEGATIVE 03/08/2018 1832   LEUKOCYTESUR NEGATIVE 03/08/2018 1832     )No results found  for this or any previous visit (from the past 240 hour(s)).    Anti-infectives (From admission, onward)   None       Radiology Studies: No results found.      Scheduled Meds: . aspirin EC  81 mg Oral Daily  . atorvastatin  20 mg Oral q1800  . carvedilol  3.125 mg Oral BID  . colchicine  0.6 mg Oral BID  . enoxaparin (LOVENOX) injection  30 mg Subcutaneous Q24H  . [START ON 03/10/2018] furosemide  40 mg Oral Daily  . [START ON 03/10/2018] levothyroxine  25 mcg Oral QAC breakfast  .  LORazepam  0-4 mg Intravenous Q12H   Or  . LORazepam  0-4 mg Oral Q12H  . magnesium citrate  1 Bottle Oral Once  . mouth rinse  15 mL Mouth Rinse BID  . [START ON 03/10/2018] potassium chloride  10 mEq Oral Daily  . predniSONE  20 mg Oral Q breakfast  . thiamine  100 mg Oral Daily   Continuous Infusions:   LOS: 1 day    Time spent: 25 min    Joseph Art, DO [facility] Pager [phone]  If 7PM-7AM, please contact night-coverage [email or web address] Password [email or web address] 03/09/2018, 1:17 PM

## 2018-03-09 NOTE — Progress Notes (Signed)
Progress Note  Patient Name: [name] Date of Encounter: 03/09/2018  Primary Cardiologist: Thurmon FairMihai Croitoru, MD   Subjective   Complains of DOE. Active at farms. Drinks 4 beers/day.   Inpatient Medications    Scheduled Meds: . amiodarone  200 mg Oral Daily  . aspirin EC  81 mg Oral Daily  . atorvastatin  20 mg Oral q1800  . carvedilol  3.125 mg Oral BID  . colchicine  0.6 mg Oral BID  . enoxaparin (LOVENOX) injection  30 mg Subcutaneous Q24H  . [START ON 03/10/2018] levothyroxine  25 mcg Oral QAC breakfast  . LORazepam  0-4 mg Intravenous Q12H   Or  . LORazepam  0-4 mg Oral Q12H  . mouth rinse  15 mL Mouth Rinse BID  . thiamine  100 mg Oral Daily   Continuous Infusions:  PRN Meds: acetaminophen **OR** acetaminophen, HYDROcodone-acetaminophen, MUSCLE RUB   Vital Signs    Vitals:   03/08/18 2054 03/09/18 0455 03/09/18 0459 03/09/18 0933  BP: 115/61   (!) 116/57  Pulse: (!) 57   (!) 58  Resp: 20     Temp:  98.9 F (37.2 C)    TempSrc:  Oral    SpO2: 100%  95% 98%  Weight:   180 lb 1.6 oz (81.7 kg)   Height:        Intake/Output Summary (Last 24 hours) at 03/09/2018 1031 Last data filed at 03/09/2018 1025 Gross per 24 hour  Intake 960 ml  Output 1100 ml  Net -140 ml   Filed Weights   03/07/18 0930 03/08/18 0543 03/09/18 0459  Weight: 187 lb 8 oz (85 kg) 181 lb 3.5 oz (82.2 kg) 180 lb 1.6 oz (81.7 kg)    Telemetry    Paced rhythm - Personally Reviewed  ECG    N/A  Physical Exam   GEN: No acute distress.   Neck: JVD ? Cardiac: RRR, no murmurs, rubs, or gallops.  Respiratory: Clear to auscultation bilaterally. GI: Soft, nontender, non-distended  MS: No edema; No deformity. Neuro:  Nonfocal  Psych: Normal affect   Labs    Chemistry Recent Labs  Lab 03/06/18 2011  03/07/18 0914 03/08/18 0518 03/09/18 0521  NA  --    < > 131* 130* 130*  K  --    < > 3.4* 3.2* 3.8  CL  --    < > 90* 90* 89*  CO2  --    < > 29 28 26   GLUCOSE  --     < > 127* 124* 137*  BUN  --    < > 36* 44* 48*  CREATININE  --    < > 2.07* 2.51* 2.37*  CALCIUM  --    < > 9.0 8.8* 9.0  PROT 8.3*  --  7.9  --   --   ALBUMIN 3.6  --  3.4*  --   --   AST 22  --  23  --   --   ALT 10*  --  10*  --   --   ALKPHOS 56  --  54  --   --   BILITOT 1.4*  --  0.9  --   --   GFRNONAA  --    < > 30* 24* 25*  GFRAA  --    < > 34* 27* 29*  ANIONGAP  --    < > 12 12 15    < > = values in this interval not displayed.  Hematology Recent Labs  Lab 03/06/18 1705 03/07/18 0914 03/09/18 0521  WBC 11.8* 13.2* 9.4  RBC 4.16* 3.63* 3.27*  HGB 9.7* 8.4* 7.8*  HCT 32.5* 28.3* 25.9*  MCV 78.1 78.0 79.2  MCH 23.3* 23.1* 23.9*  MCHC 29.8* 29.7* 30.1  RDW 18.5* 18.3* 18.9*  PLT 291 266 282    Cardiac Enzymes Recent Labs  Lab 03/06/18 2133 03/07/18 0255 03/07/18 0913  TROPONINI 0.05* 0.05* 0.04*   No results for input(s): TROPIPOC in the last 168 hours.   BNP Recent Labs  Lab 03/08/18 0518  BNP 1,000.1*    Radiology    No results found.  Cardiac Studies   Echo 03/07/18 Study Conclusions  - Left ventricle: The cavity size was mildly dilated. There was   mild concentric hypertrophy. Systolic function was moderately to   severely reduced. The estimated ejection fraction was in the   range of 30% to 35%. Diffuse hypokinesis worse in the   anterolateral, inferolateral, inferior, and apical myocardium.   Doppler parameters are consistent with a reversible restrictive   pattern, indicative of decreased left ventricular diastolic   compliance and/or increased left atrial pressure (grade 3   diastolic dysfunction). Doppler parameters are consistent with   high ventricular filling pressure. - Aortic valve: A bioprosthesis was present. Valve area (VTI): 2.02   cm^2. Valve area (Vmax): 2.02 cm^2. Valve area (Vmean): 1.95   cm^2. - Mitral valve: Transvalvular velocity was within the normal range.   There was no evidence for stenosis. There was  trivial   regurgitation. - Left atrium: The atrium was severely dilated. - Right ventricle: The cavity size was normal. Wall thickness was   normal. Systolic function was mildly reduced. - Right atrium: The atrium was moderately dilated. - Atrial septum: No defect or patent foramen ovale was identified   by color flow Doppler. - Tricuspid valve: There was mild regurgitation. - Pulmonary arteries: Systolic pressure was increased. PA peak   pressure: 44 mm Hg (S).  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   Assessment & Plan   1. Acute on chronic combined CHF - BNP 1000. Echo showed stable LVEF of 30-35% with grade 3 DD. Diuresed 850cc yesterday with IV lasix to 40mg . Scr improved. Will resume lasix 40mg  qd tomorrow.   2. Hypothyroidism - New Dx. He is on low dose amiodarone, will hold. supplement started this admission. Repeat labs in few weeks per PCP.   3. Anemia - Hgb of 7.8. Per primary team.   4. 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 He got contrast on admisison.  5. PAF - Maintaining sinus paced rhythm. Not on anticoagulation due to fall risk, anemia and ETOH.   For questions or updates, please contact [facility] Please consult [email or web address] for contact info under Cardiology/STEMI.      Lorelei Pont, PA  03/09/2018, 10:31 AM

## 2018-03-09 NOTE — Care Management Note (Signed)
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 He does not want SNF placement or HHC at this time. Stated " Im too busy for that, I try to stay active, that gout in my foot is slowing me down." He has a cane but he only use it as needed. CM will continue to follow for progression of care.  Expected Discharge Date:  03/10/18               Expected Discharge Plan:  Home w Home Health Services  Discharge planning Services  CM Consult  Choice offered to:  Patient  HH Arranged:  Patient Refused HH  Status of Service:  In process, will continue to follow  Reola MosherChandler, Leveta Wahab L, RN,MHA,BSN [phone] 03/09/2018, 2:14 PM

## 2018-03-10 LAB — CBC
HEMATOCRIT: 26.6 % — AB (ref 39.0–52.0)
HEMOGLOBIN: 7.7 g/dL — AB (ref 13.0–17.0)
MCH: 23 pg — AB (ref 26.0–34.0)
MCHC: 28.9 g/dL — ABNORMAL LOW (ref 30.0–36.0)
MCV: 79.4 fL (ref 78.0–100.0)
Platelets: 327 10*3/uL (ref 150–400)
RBC: 3.35 MIL/uL — AB (ref 4.22–5.81)
RDW: 18.7 % — ABNORMAL HIGH (ref 11.5–15.5)
WBC: 8.6 10*3/uL (ref 4.0–10.5)

## 2018-03-10 LAB — BASIC METABOLIC PANEL
Anion gap: 12 (ref 5–15)
BUN: 45 mg/dL — AB (ref 6–20)
CHLORIDE: 92 mmol/L — AB (ref 101–111)
CO2: 30 mmol/L (ref 22–32)
CREATININE: 1.65 mg/dL — AB (ref 0.61–1.24)
Calcium: 9.2 mg/dL (ref 8.9–10.3)
GFR calc non Af Amer: 39 mL/min — ABNORMAL LOW (ref >60)
GFR, EST AFRICAN AMERICAN: 45 mL/min — AB (ref >60)
Glucose, Bld: 130 mg/dL — ABNORMAL HIGH (ref 65–99)
POTASSIUM: 3.8 mmol/L (ref 3.5–5.1)
SODIUM: 134 mmol/L — AB (ref 135–145)

## 2018-03-10 LAB — FERRITIN: Ferritin: 37 ng/mL (ref 24–336)

## 2018-03-10 LAB — IRON AND TIBC
Iron: 16 ug/dL — ABNORMAL LOW (ref 45–182)
Saturation Ratios: 4 % — ABNORMAL LOW (ref 17.9–39.5)
TIBC: 405 ug/dL (ref 250–450)
UIBC: 389 ug/dL

## 2018-03-10 MED ORDER — LEVOTHYROXINE SODIUM 25 MCG PO TABS: 25.0000 ug | ORAL_TABLET | Freq: Every day | ORAL | 0 refills | Status: DC

## 2018-03-10 MED ORDER — THIAMINE HCL 100 MG PO TABS: 100.0000 mg | ORAL_TABLET | Freq: Every day | ORAL | 0 refills | Status: DC

## 2018-03-10 MED ORDER — FERUMOXYTOL INJECTION 510 MG/17 ML
510.0000 mg | Freq: Once | INTRAVENOUS | Status: AC
  Administered 2018-03-10: 510 mg via INTRAVENOUS
  Filled 2018-03-10: qty 17

## 2018-03-10 MED ORDER — FUROSEMIDE 40 MG PO TABS: 40.0000 mg | ORAL_TABLET | Freq: Every day | ORAL | 0 refills | Status: DC

## 2018-03-10 MED ORDER — POTASSIUM CHLORIDE CRYS ER 10 MEQ PO TBCR: 10.0000 meq | EXTENDED_RELEASE_TABLET | Freq: Every day | ORAL | 0 refills | Status: DC

## 2018-03-10 MED ORDER — PREDNISONE 20 MG PO TABS: 20.0000 mg | ORAL_TABLET | Freq: Every day | ORAL | 0 refills | Status: DC

## 2018-03-10 MED ORDER — ENOXAPARIN SODIUM 40 MG/0.4ML ~~LOC~~ SOLN: 40.0000 mg | SUBCUTANEOUS | Status: DC

## 2018-03-10 NOTE — Progress Notes (Signed)
Physical Therapy Treatment Patient Details Name: [name] MRN: [id number] DOB: 1941/01/21 Today's Date: 03/10/2018    History of Present Illness 77 yo male with complaints of increased SOB, peripheral edema, weight gain, near syncopal episode, low K+ and Mg+ in the ED. PMH CHF, A-fib, cardiomyopathy, s/p CABG, AVR biprosthetic valve, ICD device placement     PT Comments    Pt performed gait and functional mobility during session and required a significant decrease in transfer assistance.  Pt is refusing SNF and will return home with his wife.  Pt reports his daughter works for hospice and she will provide rehab.  Pt is not open to receiving HHPT either.  At this time based on deconditioned status SNF remains the safest option, pt understands this but still adamant to return home without HHPT.    Follow Up Recommendations  SNF(Pt refusing SNF and is also refusing HHPT.  )     Equipment Recommendations  Rolling walker with 5" wheels    Recommendations for Other Services       Precautions / Restrictions Precautions Precautions: Fall Restrictions Weight Bearing Restrictions: No    Mobility  Bed Mobility Overal bed mobility: Needs Assistance Bed Mobility: Supine to Sit     Supine to sit: Supervision     General bed mobility comments: Pt performed in a faster manner and did not require physical assistance.  Before sitting up pt flexing and straightening his knees.   Transfers Overall transfer level: Needs assistance Equipment used: Rolling walker (2 wheeled) Transfers: Sit to/from Stand Sit to Stand: Min guard         General transfer comment: Cues for hand placement to and from seated surface.  Pt tolerated without physical assistance but did require increased time and effort to achieve standing.    Ambulation/Gait Ambulation/Gait assistance: Min guard Ambulation Distance (Feet): 120 Feet Assistive device: Rolling walker (2 wheeled) Gait Pattern/deviations:  Step-through pattern;Trunk flexed   Gait velocity interpretation: Below normal speed for age/gender General Gait Details: Cues for forward gaze and RW safety.  Pt with increased distance and motivated to return home.  Reports discomfort in B knees but gait does not appear antalgic.     Stairs            Wheelchair Mobility    Modified Rankin (Stroke Patients Only)       Balance Overall balance assessment: Needs assistance   Sitting balance-Leahy Scale: Good       Standing balance-Leahy Scale: Poor Standing balance comment: reliant on UE support                             Cognition Arousal/Alertness: Awake/alert Behavior During Therapy: WFL for tasks assessed/performed Overall Cognitive Status: Within Functional Limits for tasks assessed                                        Exercises      General Comments        Pertinent Vitals/Pain      Home Living                      Prior Function            PT Goals (current goals can now be found in the care plan section) Acute Rehab PT Goals Patient Stated Goal: to  get better, go home  Potential to Achieve Goals: Fair Progress towards PT goals: Progressing toward goals    Frequency    Min 3X/week      PT Plan Current plan remains appropriate    Co-evaluation              AM-PAC PT "6 Clicks" Daily Activity  Outcome Measure  Difficulty turning over in bed (including adjusting bedclothes, sheets and blankets)?: None Difficulty moving from lying on back to sitting on the side of the bed? : None Difficulty sitting down on and standing up from a chair with arms (e.g., wheelchair, bedside commode, etc,.)?: A Lot Help needed moving to and from a bed to chair (including a wheelchair)?: A Little Help needed walking in hospital room?: A Little Help needed climbing 3-5 steps with a railing? : A Little 6 Click Score: 19    End of Session Equipment Utilized  During Treatment: Gait belt Activity Tolerance: Patient limited by pain Patient left: in bed;with call bell/phone within reach;with bed alarm set Nurse Communication: Mobility status PT Visit Diagnosis: Unsteadiness on feet (R26.81);Muscle weakness (generalized) (M62.81);Difficulty in walking, not elsewhere classified (R26.2);Pain Pain - Right/Left: Left(Bilateral) Pain - part of body: Ankle and joints of foot     Time: [id number] PT Time Calculation (min) (ACUTE ONLY): 15 min  Charges:  $Gait Training: 8-22 mins                    G Codes:       Joycelyn RuaAimee Leightyn Cina, PTA pager [phone]    Florestine Aversimee J Gregori Abril 03/10/2018, 4:43 PM

## 2018-03-10 NOTE — Progress Notes (Addendum)
Pt had a large amount of stool, brown in color, specimen sent down to the lab. Pt stated " I feel a lot of relief after my bowels moved"." Will continue to monitor pt.

## 2018-03-10 NOTE — Progress Notes (Signed)
Progress Note  Patient Name: [name] Date of EnAdelene Amascounter: 03/10/2018  Primary Cardiologist: Thurmon FairMihai Croitoru, MD   Subjective   No chest pain or dyspnea. Fatigue and tired.   Inpatient Medications    Scheduled Meds: . aspirin EC  81 mg Oral Daily  . atorvastatin  20 mg Oral q1800  . carvedilol  3.125 mg Oral BID  . colchicine  0.6 mg Oral BID  . enoxaparin (LOVENOX) injection  30 mg Subcutaneous Q24H  . furosemide  40 mg Oral Daily  . levothyroxine  25 mcg Oral QAC breakfast  . LORazepam  0-4 mg Intravenous Q12H   Or  . LORazepam  0-4 mg Oral Q12H  . mouth rinse  15 mL Mouth Rinse BID  . potassium chloride  10 mEq Oral Daily  . predniSONE  20 mg Oral Q breakfast  . thiamine  100 mg Oral Daily   Continuous Infusions:  PRN Meds: acetaminophen **OR** acetaminophen, HYDROcodone-acetaminophen, MUSCLE RUB   Vital Signs    Vitals:   03/09/18 2049 03/09/18 2148 03/10/18 0556 03/10/18 0826  BP: (!) 125/53  (!) 102/59 119/75  Pulse: (!) 56 (!) 58 (!) 57 (!) 55  Resp:      Temp: 98.4 F (36.9 C)  98.5 F (36.9 C)   TempSrc: Oral  Oral   SpO2: 97%  94% 97%  Weight:   179 lb (81.2 kg)   Height:        Intake/Output Summary (Last 24 hours) at 03/10/2018 0936 Last data filed at 03/10/2018 [id number] Gross per 24 hour  Intake 1080 ml  Output 1475 ml  Net -395 ml   Filed Weights   03/08/18 0543 03/09/18 0459 03/10/18 0556  Weight: 181 lb 3.5 oz (82.2 kg) 180 lb 1.6 oz (81.7 kg) 179 lb (81.2 kg)    Telemetry    V pacing- Personally Reviewed  ECG    N/A  Physical Exam   GEN: No acute distress.   Neck: No JVD Cardiac: RRR, no murmurs, rubs, or gallops.  Respiratory: Clear to auscultation bilaterally. GI: Soft, nontender, non-distended  MS: No edema; No deformity. Neuro:  Nonfocal  Psych: Normal affect   Labs    Chemistry Recent Labs  Lab 03/06/18 2011  03/07/18 0914 03/08/18 0518 03/09/18 0521  NA  --    < > 131* 130* 130*  K  --    < > 3.4*  3.2* 3.8  CL  --    < > 90* 90* 89*  CO2  --    < > 29 28 26   GLUCOSE  --    < > 127* 124* 137*  BUN  --    < > 36* 44* 48*  CREATININE  --    < > 2.07* 2.51* 2.37*  CALCIUM  --    < > 9.0 8.8* 9.0  PROT 8.3*  --  7.9  --   --   ALBUMIN 3.6  --  3.4*  --   --   AST 22  --  23  --   --   ALT 10*  --  10*  --   --   ALKPHOS 56  --  54  --   --   BILITOT 1.4*  --  0.9  --   --   GFRNONAA  --    < > 30* 24* 25*  GFRAA  --    < > 34* 27* 29*  ANIONGAP  --    < >  12 12 15    < > = values in this interval not displayed.     Hematology Recent Labs  Lab 03/06/18 1705 03/07/18 0914 03/09/18 0521  WBC 11.8* 13.2* 9.4  RBC 4.16* 3.63* 3.27*  HGB 9.7* 8.4* 7.8*  HCT 32.5* 28.3* 25.9*  MCV 78.1 78.0 79.2  MCH 23.3* 23.1* 23.9*  MCHC 29.8* 29.7* 30.1  RDW 18.5* 18.3* 18.9*  PLT 291 266 282    Cardiac Enzymes Recent Labs  Lab 03/06/18 2133 03/07/18 0255 03/07/18 0913  TROPONINI 0.05* 0.05* 0.04*   No results for input(s): TROPIPOC in the last 168 hours.   BNP Recent Labs  Lab 03/08/18 0518  BNP 1,000.1*    Radiology    No results found.  Cardiac Studies   Echo 03/07/18 Study Conclusions  - Left ventricle: The cavity size was mildly dilated. There was mild concentric hypertrophy. Systolic function was moderately to severely reduced. The estimated ejection fraction was in the range of 30% to 35%. Diffuse hypokinesis worse in the anterolateral, inferolateral, inferior, and apical myocardium. Doppler parameters are consistent with a reversible restrictive pattern, indicative of decreased left ventricular diastolic compliance and/or increased left atrial pressure (grade 3 diastolic dysfunction). Doppler parameters are consistent with high ventricular filling pressure. - Aortic valve: A bioprosthesis was present. Valve area (VTI): 2.02 cm^2. Valve area (Vmax): 2.02 cm^2. Valve area (Vmean): 1.95 cm^2. - Mitral valve: Transvalvular velocity was  within the normal range. There was no evidence for stenosis. There was trivial regurgitation. - Left atrium: The atrium was severely dilated. - Right ventricle: The cavity size was normal. Wall thickness was normal. Systolic function was mildly reduced. - Right atrium: The atrium was moderately dilated. - Atrial septum: No defect or patent foramen ovale was identified by color flow Doppler. - Tricuspid valve: There was mild regurgitation. - Pulmonary arteries: Systolic pressure was increased. PA peak pressure: 44 mm Hg (S).  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    Assessment & Plan     1. Acute on chronic combined CHF - BNP 1000. Echo showed stable LVEF of 30-35% with grade 3 DD. - Net I & O +530cc. Weight down 9 lb (188-->179). He is euvolemic. Resume lasix at low dose of 40mg  qd.   2. Hypothyroidism - New Dx. He is on low dose amiodarone, now discontinued. Supplement started this admission. Repeat labs in few weeks per PCP.   3. Anemia - Hgb of 7.8. Yesterday, pending lab today. Prior hx of GI bleed. Unsure if had blood in his stool.   4. 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  He got contrast on admisison. Pending labs today.   5. PAF - V paced rhythm. Not on anticoagulation due to fall risk, anemia and ETOH. Disconinued Amiodarone due to hypothyroidism. Monitor afib on device.     For questions or updates, please contact [facility] Please consult [email or web address] for contact info under Cardiology/STEMI.      Lorelei Pont, PA  03/10/2018,  9:36 AM

## 2018-03-10 NOTE — Progress Notes (Signed)
Pt complains of not being successful of having a BM after the Mg Citrate he took earlier and pt requested if he can have a suppository. MD paged and ordered Dulcolax suppository. Dulcolax suppository given and tolerated well.

## 2018-03-10 NOTE — Discharge Summary (Addendum)
Physician Discharge Summary  ROREY BISSON ZOX:[id number] DOB: 30-Jan-1941 DOA: 03/06/2018  PCP: Patient, No Pcp Per  Admit date: 03/06/2018 Discharge date: 03/10/2018   Recommendations for Outpatient Follow-Up:   1. Patient to establish with PCP-- needs follow up TSH, H/H, BMP 2. Alcohol cessation   Discharge Diagnosis:   Active Problems:   Hypothyroidism   Hyperlipidemia   Essential hypertension   Cardiomyopathy, ischemic   Peripheral vascular disease (HCC)   Alcohol withdrawal (HCC)   PAF (paroxysmal atrial fibrillation) (HCC)   Hypokalemia   AKI (acute kidney injury) (HCC)   Postural dizziness with presyncope   Prolonged QT interval   Elevated troponin gout  Discharge disposition:  Home.  Discharge Condition: Improved.  Diet recommendation: Low sodium, heart healthy  Wound care: None.   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 In the ED patient was noted to have severe hypokalemia of 2.2 with a magnesium of 1.8 and is admitted for further management.     Hospital Course by Problem:    paroxysmal atrial fibrillation: CHA2DS2 vas score 4 No anticoagulationdueto Risk of Falls/recurrent bleeding  Rate control with Coreg   Hypokalemia/hypomagnesemia Repleted  Alcohol abuse -CIWA -no sign of withdrawal  Ischemic cardiomyopathy: Mild acute decompensation due to medication non-compliance -cardiology consult appreciated  Acute kidney injury on CKD -baseline  appears to be 1.8ish -avoid nephrotoxins/hypotension-- was taking ibuprofen prior to admission -suspect from contrast -needs outpatient follow up  Hyponatremia -? From over-diuresis -improved  Gout -improved since admission -treat with steroids  Hypothyroid -? From amiodarone -start synthroid -needs outpatient follow up  Stable ascending thoracic aneurysm -outpatient follow up      Medical Consultants:    cards   Discharge Exam:   Vitals:   03/10/18 1230 03/10/18 1231  BP: (!) 120/59 (!) 120/59  Pulse: (!) 54 (!) 54  Resp: 20 20  Temp: 98.1 F (36.7 C) 98.1 F (36.7 C)  SpO2: 99% 99%   Vitals:   03/10/18 0556 03/10/18 0826 03/10/18 1230 03/10/18 1231  BP: (!) 102/59 119/75 (!) 120/59 (!) 120/59  Pulse: (!) 57 (!) 55 (!) 54 (!) 54  Resp:   20 20  Temp: 98.5 F (36.9 C)  98.1 F (36.7 C) 98.1 F (36.7 C)  TempSrc: Oral  Oral Oral  SpO2: 94% 97% 99% 99%  Weight: 81.2 kg (179 lb)     Height:        Gen:  NAD   The results of significant diagnostics from this hospitalization (including imaging, microbiology, ancillary and laboratory) are listed below for reference.     Procedures and Diagnostic Studies:   Ct Head Wo Contrast  Result Date: 03/06/2018 CLINICAL DATA:  77 year old presenting with acute onset of left-sided weakness and lower extremity numbness. EXAM: CT HEAD WITHOUT CONTRAST TECHNIQUE: Contiguous axial images were obtained from the base of the skull through the vertex without intravenous contrast. COMPARISON:  06/05/2017, 02/18/2016, 03/02/2013. FINDINGS: Brain: Slight head tilt in the gantry accounts for apparent asymmetry in the cerebral hemispheres. Moderate age related cortical and deep atrophy, unchanged dating back to 2014. Mild changes of  small vessel disease of the white matter diffusely, progressive since 2014. Mild cerebellar atrophy, unchanged. No mass lesion. No midline shift. No acute hemorrhage or hematoma. No extra-axial  fluid collections. No evidence of acute infarction. Vascular: Severe bilateral carotid siphon and vertebral artery atherosclerosis. No hyperdense vessel. Skull: No skull fracture or other focal osseous abnormality involving the skull. Sinuses/Orbits: Visualized paranasal sinuses, bilateral mastoid air cells and bilateral middle ear cavities well-aerated. Visualized orbits and globes normal in appearance. Other: None. IMPRESSION: 1. No acute intracranial abnormality. 2. Stable moderate age related generalized atrophy. Mild chronic microvascular ischemic changes of the white matter diffusely. Electronically Signed   By: Hulan Saas M.D.   On: 03/06/2018 19:07   Ct Angio Chest/abd/pel For Dissection W And/or Wo Contrast  Result Date: 03/06/2018 CLINICAL DATA:  77 year old with acute dizziness when standing yesterday while working, associated with generalized weakness. The generalized weakness persists today. Known ascending thoracic aortic aneurysm. EXAM: CT ANGIOGRAPHY CHEST, ABDOMEN AND PELVIS TECHNIQUE: Initially, multidetector CT imaging through the chest was performed. Subsequently, multidetector CT imaging through the chest, abdomen and pelvis was performed using the standard protocol during bolus administration of intravenous contrast. Multiplanar reconstructed images and MIPs were obtained and reviewed to evaluate the vascular anatomy. CONTRAST:  75 mL ISOVUE-370 IOPAMIDOL INJECTION 76% IV. COMPARISON:  CTA chest 06/05/2017.  No prior abdominopelvic CTA. FINDINGS: CTA CHEST FINDINGS Cardiovascular: Prior sternotomy for CABG. Unenhanced images demonstrate calcified plaque throughout the thoracic aorta and involving the proximal great vessels. Severe three-vessel native coronary atherosclerosis is again demonstrated. Extensive mitral annular calcification. Enhanced images demonstrate severe thickening of the leaflets of the aortic valve, associated with moderate calcification. Heart moderately to  markedly enlarged with left ventricular predominance, unchanged. Right subclavian transvenous pacemaker lead tips in the right atrial appendage and at the RV apex, unchanged. No pericardial effusion. Patent coronary grafts. The previously identified ascending thoracic aortic aneurysm measuring up to 5.1 cm diameter is unchanged since June, 2018. No evidence of aneurysm involving the arch or the descending thoracic aorta. Tortuosity and ectasia of the innominate artery is better seen on today's examination, maximum diameter 1.5 cm. Atherosclerosis at the origin of the left common carotid artery without evidence of hemodynamically significant stenosis. Severe atherosclerosis involving the remaining great vessels. No evidence of dissection. Mediastinum/Nodes: No pathologically enlarged mediastinal, hilar or axillary lymph nodes. No mediastinal masses. Normal-appearing esophagus. Subcentimeter nodules in the normal sized thyroid gland as noted previously. Lungs/Pleura: Expected dependent atelectasis posteriorly in the lower lobes. Lung parenchyma otherwise clear without evidence of airspace consolidation, interstitial disease, or parenchymal nodules or masses. Musculoskeletal: Mild degenerative disc disease and spondylosis involving the thoracic spine. No acute abnormalities. Review of the MIP images confirms the above findings. CTA ABDOMEN AND PELVIS FINDINGS VASCULAR Aorta: Severe atherosclerosis with calcified and noncalcified plaque. No evidence of aneurysm. Celiac: Atherosclerosis at its origin without evidence of hemodynamically significant stenosis. Atherosclerosis at its bifurcation without stenosis. SMA: Atherosclerosis at its origin without evidence of hemodynamically significant stenosis. Atherosclerosis involving the descending portion of the SMA without stenosis. Renals: Single renal arteries bilaterally, each with extensive plaque at its origin, likely causing hemodynamically significant stenoses,  especially on the left. IMA: Patent with atherosclerosis at its origin Inflow: Severe atherosclerosis involving the iliofemoral arterial system bilaterally without evidence of significant stenosis. Veins: Not evaluated. Review of the MIP images confirms the above findings. NON-VASCULAR Hepatobiliary: Normal appearing liver for the early arterial phase of enhancement. Gallbladder normal in appearance without calcified gallstones. No biliary ductal  dilation. Pancreas: Moderate atrophy. No pancreatic mass or peripancreatic inflammation. No pancreatic ductal dilation. Spleen: Normal in size and appearance. Adrenals/Urinary Tract: Normal appearing adrenal glands. Left renal atrophy with diffuse cortical thinning. Relatively well-preserved right renal cortex. Benign approximate 3 cm cyst arising from the lateral mid right kidney. Smaller benign cortical cysts in both kidneys. No urinary tract calculi. No hydronephrosis. Perinephric edema bilaterally. Mildly distended normal appearing urinary bladder. Stomach/Bowel: Stomach decompressed and normal in appearance. Normal-appearing small bowel. Mobile cecum present in the right upper quadrant. Large colonic stool burden. Sigmoid colon tortuous. No visible colonic diverticula. Appendix not visible but no evidence of pericecal inflammation. Lymphatic: No pathologic lymphadenopathy. Reproductive: Mild prostate gland enlargement. Normal seminal vesicles. Other: None. Musculoskeletal: Severe degenerative disc disease at L5-S1 with a right posterolateral desiccated disc herniation and migration of the fragment behind S1. Severe facet degenerative changes throughout the lumbar spine. 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 Degenerative changes involving the sacroiliac joints. Review of the MIP images confirms the above findings. IMPRESSION: CTA Chest: 1. Stable approximate 5.1 cm ascending thoracic aortic aneurysm dating back to June, 2018. Recommend continued semi-annual  imaging followup by CTA or MRA and referral to cardiothoracic surgery if not already obtained. This recommendation follows 2010 ACCF/AHA/AATS/ACR/ASA/SCA/SCAI/SIR/STS/SVM Guidelines for the Diagnosis and Management of Patients With Thoracic Aortic Disease. Circulation. 2010; 121: Z610-R604. 2.  No acute cardiopulmonary disease. 3. Stable marked cardiomegaly with left ventricular enlargement. Prior CABG. Patent coronary grafts. 4. Marked thickening of the aortic valve leaflets associated with moderate valvular calcification. 5. 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 CTA Abdomen/Pelvis: 1. No evidence of abdominal aortic aneurysm. 2.  Aortic Atherosclerosis (ICD10-170.0) 3. Severe atherosclerosis involving the proximal renal arteries bilaterally, with likely high-grade stenoses, especially on the left. 4. Atherosclerotic though patent celiac artery, SMA and IMA. 5. Left renal atrophy. 6. No acute abnormalities involving the abdomen or pelvis. 7. 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 Desiccated disc herniation at L5-S1. Electronically Signed   By: Hulan Saas M.D.   On: 03/06/2018 19:32     Labs:   Basic Metabolic Panel: Recent Labs  Lab 03/06/18 1905 03/06/18 2133 03/06/18 2240 03/07/18 0914 03/08/18 0518 03/09/18 0521 03/10/18 0847  NA  --   --  133* 131* 130* 130* 134*  K  --   --  2.9* 3.4* 3.2* 3.8 3.8  CL  --   --  86* 90* 90* 89* 92*  CO2  --   --  25 29 28 26 30   GLUCOSE  --   --  90 127* 124* 137* 130*  BUN  --   --  33* 36* 44* 48* 45*  CREATININE  --   --  1.73* 2.07* 2.51* 2.37* 1.65*  CALCIUM  --   --  9.4 9.0 8.8* 9.0 9.2  MG 1.8 1.9  --  2.2 2.3  --   --   PHOS  --  3.7  --  2.4*  --   --   --    GFR Estimated Creatinine Clearance: 43.7 mL/min (A) (by C-G formula based on SCr of 1.65 mg/dL (H)). Liver Function Tests: Recent Labs  Lab 03/06/18 2011 03/07/18 0914  AST 22 23  ALT 10* 10*  ALKPHOS 56 54  BILITOT 1.4* 0.9  PROT 8.3* 7.9    ALBUMIN 3.6 3.4*   No results for input(s): LIPASE, AMYLASE in the last 168 hours. No results for input(s): AMMONIA in the last 168 hours. Coagulation profile Recent  Labs  Lab 03/06/18 2133  INR 1.16    CBC: Recent Labs  Lab 03/06/18 1705 03/07/18 0914 03/09/18 0521 03/10/18 0847  WBC 11.8* 13.2* 9.4 8.6  NEUTROABS 9.2*  --   --   --   HGB 9.7* 8.4* 7.8* 7.7*  HCT 32.5* 28.3* 25.9* 26.6*  MCV 78.1 78.0 79.2 79.4  PLT 291 266 282 327   Cardiac Enzymes: Recent Labs  Lab 03/06/18 2011 03/06/18 2133 03/07/18 0255 03/07/18 0913  CKTOTAL 69  --   --   --   TROPONINI  --  0.05* 0.05* 0.04*   BNP: Invalid input(s): POCBNP CBG: No results for input(s): GLUCAP in the last 168 hours. D-Dimer No results for input(s): DDIMER in the last 72 hours. Hgb A1c No results for input(s): HGBA1C in the last 72 hours. Lipid Profile No results for input(s): CHOL, HDL, LDLCALC, TRIG, CHOLHDL, LDLDIRECT in the last 72 hours. Thyroid function studies No results for input(s): TSH, T4TOTAL, T3FREE, THYROIDAB in the last 72 hours.  Invalid input(s): FREET3 Anemia work up Recent Labs    03/10/18 0607  FERRITIN 37  TIBC 405  IRON 16*   Microbiology No results found for this or any previous visit (from the past 240 hour(s)).   Discharge Instructions:   Discharge Instructions    (HEART FAILURE PATIENTS) Call MD:  Anytime you have any of the following symptoms: 1) 3 pound weight gain in 24 hours or 5 pounds in 1 week 2) shortness of breath, with or without a dry hacking cough 3) swelling in the hands, feet or stomach 4) if you have to sleep on extra pillows at night in order to breathe.   Complete by:  As directed    Diet - low sodium heart healthy   Complete by:  As directed    Discharge instructions   Complete by:  As directed    Need a PCP to follow up with for gout, your TSH (on synthroid now), to check labs for your kidney function, and for your anemia (I have given you a  dose of IV iron to replete your body stores) Avoid excessive alcohol use-- ideally NO alcohol use   Increase activity slowly   Complete by:  As directed      Allergies as of 03/10/2018      Reactions   Atorvastatin Other (See Comments)   Joint pain (patient takes this, however)   Other Other (See Comments)   Patient is sodium-restricted, per his admission      Medication List    STOP taking these medications   amiodarone 200 MG tablet Commonly known as:  PACERONE   colchicine 0.6 MG tablet   ibuprofen 200 MG tablet Commonly known as:  ADVIL,MOTRIN   metolazone 2.5 MG tablet Commonly known as:  ZAROXOLYN     TAKE these medications   aspirin EC 81 MG tablet Take 81 mg by mouth daily.   atorvastatin 20 MG tablet Commonly known as:  LIPITOR TAKE 1 TABLET (20 MG TOTAL) BY MOUTH DAILY.   carvedilol 3.125 MG tablet Commonly known as:  COREG Take 1 tablet (3.125 mg total) by mouth 2 (two) times daily. Keep appointment 01/25/18 What changed:  additional instructions   furosemide 40 MG tablet Commonly known as:  LASIX Take 1 tablet (40 mg total) by mouth daily. Start taking on:  03/11/2018 What changed:    how much to take  when to take this   levothyroxine 25 MCG tablet Commonly known  as:  SYNTHROID, LEVOTHROID Take 1 tablet (25 mcg total) by mouth daily before breakfast. Start taking on:  03/11/2018   nitroGLYCERIN 0.4 MG SL tablet Commonly known as:  NITROSTAT PUT1TAB UNDER TONGUE EVERY 5MINS AS NEEDED FOR CHESTPAIN, UP TO 3DOSES. IF NOT RELIEVED, CALL EMS/MD What changed:  See the new instructions.   potassium chloride 10 MEQ tablet Commonly known as:  K-DUR,KLOR-CON Take 1 tablet (10 mEq total) by mouth daily. Start taking on:  03/11/2018 What changed:    medication strength  how much to take  when to take this   predniSONE 20 MG tablet Commonly known as:  DELTASONE Take 1 tablet (20 mg total) by mouth daily with breakfast. Start taking on:   03/11/2018   thiamine 100 MG tablet Take 1 tablet (100 mg total) by mouth daily. Start taking on:  03/11/2018   vitamin B-12 1000 MCG tablet Commonly known as:  CYANOCOBALAMIN Take 1 tablet (1,000 mcg total) by mouth daily.      Follow-up Information    Ellsworth LennoxStrader, Brittany M, PA-C. Go on 03/26/2018.   Specialties:  Physician Assistant, Cardiology Why:  @2pm  for hospital follow up Contact information: [address] [place] [address] [phone]        Sheliah Hatchabori, Katherine E, MD.   Specialty:  Family Medicine Why:  or another provider in this office-- call to make appointment Contact information: [address] [place] [address] [phone]            Time coordinating discharge: 35 min  Signed:  Joseph ArtJessica U Tynslee Bowlds   [facility] 03/10/2018, 4:24 PM

## 2018-03-16 ENCOUNTER — Other Ambulatory Visit: Payer: Self-pay | Admitting: Cardiovascular Disease

## 2018-03-25 NOTE — Progress Notes (Signed)
Cardiology Office Note    Date:  03/26/2018   ID:  [name], DOB 1941-01-31, MRN [id number]  PCP:  Sheliah Hatchabori, Katherine E, MD  Cardiologist: Thurmon FairMihai Croitoru, MD    Chief Complaint  Patient presents with  . Hospitalization Follow-up    History of Present Illness:    Donald AmasDonald R Thelin is a 77 y.o. 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  He was last examined by Dr. Royann Shiversroitoru in 01/2018 and was noted to be significantly volume overloaded by physical examination. His Lasix was increased to 80 mg twice daily with Metolazone 2.5 mg daily and he followed up later that week. On 01/29/2018, weight had declined over 9 pounds and was improved to 188 lbs.  He noted significant improvement in his respiratory status and edema. He was continued on Lasix and informed to take Metolazone as needed for weight gain but to not exceed more than 3 doses in a week.  In the interim, [facility] was [facility] to 03/10/2018 for worsening shortness of breath and lower extremity edema. Was found to be significantly hypokalemic with potassium at 2.2 and BNP was greater than 1000. He diuresed well with IV Lasix and was transitioned to PO Lasix 40 mg daily at the time of discharge. Creatinine peaked at 2.51 during admission but was trending down to 1.65 on 03/10/2018. Was also found to have hypothyroidism with TSH  elevated at 55, therefore Amiodarone was discontinued.  In talking with the patient today, he reports overall doing well since his recent hospitalization. 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 He notes significant improvement in his respiratory status and fatigue. Denies any recent chest pain, palpitations, orthopnea, or PND. Has noticed occasional lower extremity edema which resolves by the following day.  He was previously consuming 5-6 beers per day and says he has only consumed 2 beers in total since hospital discharge last month. Says weight has been stable at 179-181 lbs on his home scales. Has not had to take as needed Metolazone.   Past Medical History:  Diagnosis Date  . Anginal pain (HCC)   . Anxiety   . Aortic stenosis    a. s/p bioprosthetic AVR in 2010  . Arthritis    gout  . CAD (coronary artery disease)    a. s/p CABG in 1992 b. redo CABG in 2004 c. cath in 2010 showing patent LIMA-LAD, free RIMA-OM, and SVG-OM, and 80% stenosis of small PLA branch  . Chest pain   . CHF (congestive heart failure) (HCC)   . Dysrhythmia   . GERD (gastroesophageal reflux disease)    with gas and bloating probably cause of chest pain  . H/O atrial flutter    a. s/p ablation and not on anticoagulation secondary to GIB and alcohol abuse.   . H/O: gout   . Hyperlipidemia   . Hypertension   . ICD (  implantable cardiac defibrillator) infection, s/p removal of ICD 07/12/2012   re-implanted Medtronic BiV ICD, serial number [id number]   . Ischemic cardiomyopathy    a. EF previously 20-25%, improved to 40-45% by echo in 03/2013 b. 10/2017: echo showing of 30-35% with Grade 2 DD, normal function of AVR, moderate MR.   . Myocardial infarction (HCC)   . 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 Shortness of breath     Past Surgical History:  Procedure Laterality Date  . BI-VENTRICULAR IMPLANTABLE CARDIOVERTER DEFIBRILLATOR  Right 08/18/2012   Medtronic BiV ICD, serial number [id number] ; Procedure: BI-VENTRICULAR IMPLANTABLE CARDIOVERTER DEFIBRILLATOR  (CRT-D);  Surgeon: Marinus Maw, MD;  Location: [facility] CATH LAB;  Service: Cardiovascular;  Laterality: Right;  . CARDIAC CATHETERIZATION  Dec 18, 2003   with a presentation of atrial flutter with rapid ventricular response. He had a cath and two stents to his PDA after his SVG to his RCA. His other graft were patent. He had LV with EF of 30%.   . CAROTID ENDARTERECTOMY  11/2002   left in December 2003 and known renal artery stenosis of 60-70%  . CORONARY ARTERY BYPASS GRAFT  1993   redo in 2000, he has had multiple MI previous to both procedures.  . IMPLANTABLE CARDIOVERTER DEFIBRILLATOR (ICD) GENERATOR CHANGE N/A 06/25/2012   Procedure: ICD GENERATOR CHANGE;  Surgeon: Marinus Maw, MD;  Location: [facility] CATH LAB;  Service: Cardiovascular;  Laterality: N/A;  . VALVE REPLACEMENT  2010    Current Medications: Outpatient Medications Prior to Visit  Medication Sig Dispense Refill  . aspirin EC 81 MG tablet Take 81 mg by mouth daily.     [name] atorvastatin (LIPITOR) 20 MG tablet TAKE 1 TABLET (20 MG TOTAL) BY MOUTH DAILY. 30 tablet 9  . carvedilol (COREG) 3.125 MG tablet Take 1 tablet (3.125 mg total) by mouth 2 (two) times daily. Keep appointment 01/25/18 (Patient taking differently: Take 3.125 mg by mouth 2 (two) times daily. ) 60 tablet 2  . furosemide (LASIX) 40 MG tablet Take 1 tablet (40 mg total) by mouth daily. 30 tablet 0  . levothyroxine (SYNTHROID, LEVOTHROID) 25 MCG tablet Take 1 tablet (25 mcg total) by mouth daily before breakfast. 30 tablet 0  . nitroGLYCERIN (NITROSTAT) 0.4 MG SL tablet PUT1TAB UNDER TONGUE EVERY AS NEEDED FOR CHESTPAIN, UP TO 3DOSES. IF NOT RELIEVED, CALL EMS/MD (Patient taking differently: Place 1 tablet (0.4 mg) under the tongue every 5 minutes as needed for chest pain for up to 3 doses/call 9-1-1 and MD if no relief) 25 tablet 0  .  potassium chloride (K-DUR,KLOR-CON) 10 MEQ tablet Take 1 tablet (10 mEq total) by mouth daily. 30 tablet 0  . predniSONE (DELTASONE) 20 MG tablet Take 1 tablet (20 mg total) by mouth daily with breakfast. 3 tablet 0  . thiamine 100 MG tablet Take 1 tablet (100 mg total) by mouth daily. 30 tablet 0  . vitamin B-12 (CYANOCOBALAMIN) 1000 MCG tablet Take 1 tablet (1,000 mcg total) by mouth daily.     No facility-administered medications prior to visit.      Allergies:   Atorvastatin and Other   Social History   Socioeconomic History  . Marital status: Married    Spouse name: Not on file  . Number of children: Not on file  . Years of education: Not on file  . Highest education level: Not on file  Occupational History  . Not on file  Social Needs  . Financial resource strain: Not on file  . Food insecurity:    Worry: Not on file    Inability: Not on file  . Transportation needs:    Medical: Not on file    Non-medical: Not on file  Tobacco Use  . Smoking status: Never Smoker  . Smokeless tobacco: Current User    Types: Chew  Substance and Sexual Activity  . Alcohol use: Yes    Alcohol/week: 8.4 oz    Types: 14 Cans of beer per week  . Drug use: No  . Sexual activity: Not on file  Lifestyle  . Physical activity:    Days per week: Not on file    Minutes per session: Not on file  . Stress: Not on file  Relationships  . Social connections:    Talks on phone: Not on file    Gets together: Not on file    Attends religious service: Not on file    Active member of club or organization: Not on file    Attends meetings of clubs or organizations: Not on file    Relationship status: Not on file  Other Topics Concern  . Not on file  Social History Narrative  . Not on file     Family History:  The patient's family history includes CAD in his brother.   Review of Systems:   Please see the history of present illness.     General:  No chills, fever, night sweats or weight  changes.  Cardiovascular:  No chest pain, dyspnea on exertion, orthopnea, palpitations, paroxysmal nocturnal dyspnea.  Positive for lower extremity edema. Dermatological: No rash, lesions/masses Respiratory: No cough, dyspnea Urologic: No hematuria, dysuria Abdominal:   No nausea, vomiting, diarrhea, bright red blood per rectum, melena, or hematemesis Neurologic:  No visual changes, wkns, changes in mental status. All other systems reviewed and are otherwise negative except as noted above.   Physical Exam:    VS:  BP 108/62   Pulse 90   Ht 6\' 1"  (1.854 m)   Wt 184 lb (83.5 kg)   SpO2 90%   BMI 24.28 kg/m    General: Well developed, well nourished Caucasian male appearing in no acute distress. Head: Normocephalic, atraumatic, sclera non-icteric, no xanthomas, nares are without discharge.  Neck: No carotid bruits. JVD not elevated.  Lungs: Respirations regular and unlabored, without wheezes or rales.  Heart: Regular rate and rhythm. No S3 or S4.  No murmur, no rubs, or gallops appreciated. Abdomen: Soft, non-tender, non-distended with normoactive bowel sounds. No hepatomegaly. No rebound/guarding. No obvious abdominal masses. Msk:  Strength and tone appear normal for age. No joint deformities or effusions. Extremities: No clubbing or cyanosis. Trace lower extremity edema.  Distal pedal pulses are 2+ bilaterally. Neuro: Alert and oriented X 3. Moves all extremities spontaneously. No focal deficits noted. Psych:  Responds to questions appropriately with a normal affect. Skin: No rashes or lesions noted  Wt Readings from Last 3 Encounters:  03/26/18 184 lb (83.5 kg)  03/10/18 179 lb (81.2 kg)  01/29/18 188 lb (85.3 kg)     Studies/Labs Reviewed:   EKG:  EKG is not ordered today.  Recent Labs: 01/28/2018: NT-Pro BNP 3,838 03/07/2018: ALT 10; TSH 55.222 03/08/2018: B Natriuretic Peptide 1,000.1; Magnesium 2.3 03/10/2018: Hemoglobin 7.7; Platelets 327 03/26/2018: BUN 17; Creatinine,  Ser 1.47; Potassium 3.8; Sodium 139   Lipid Panel    Component Value Date/Time   CHOL 158 12/19/2016 0841   TRIG  74 12/19/2016 0841   HDL 38 (L) 12/19/2016 0841   CHOLHDL 4.2 12/19/2016 0841   VLDL 15 12/19/2016 0841   LDLCALC 105 (H) 12/19/2016 0841    Additional studies/ records that were reviewed today include:   Echocardiogram: 03/07/2018 Study Conclusions  - Left ventricle: The cavity size was mildly dilated. There was   mild concentric hypertrophy. Systolic function was moderately to   severely reduced. The estimated ejection fraction was in the   range of 30% to 35%. Diffuse hypokinesis worse in the   anterolateral, inferolateral, inferior, and apical myocardium.   Doppler parameters are consistent with a reversible restrictive   pattern, indicative of decreased left ventricular diastolic   compliance and/or increased left atrial pressure (grade 3   diastolic dysfunction). Doppler parameters are consistent with   high ventricular filling pressure. - Aortic valve: A bioprosthesis was present. Valve area (VTI): 2.02   cm^2. Valve area (Vmax): 2.02 cm^2. Valve area (Vmean): 1.95   cm^2. - Mitral valve: Transvalvular velocity was within the normal range.   There was no evidence for stenosis. There was trivial   regurgitation. - Left atrium: The atrium was severely dilated. - Right ventricle: The cavity size was normal. Wall thickness was   normal. Systolic function was mildly reduced. - Right atrium: The atrium was moderately dilated. - Atrial septum: No defect or patent foramen ovale was identified   by color flow Doppler. - Tricuspid valve: There was mild regurgitation. - Pulmonary arteries: Systolic pressure was increased. PA peak   pressure: 44 mm Hg (S).  Assessment:    1. COMBINED HEART FAILURE, CHRONIC   2. Coronary artery disease involving autologous vein coronary bypass graft without angina pectoris   3. CRT-D Medtronic   4. H/O aortic valve replacement     5. PAF (paroxysmal atrial fibrillation) (HCC)   6. Hypothyroidism, unspecified type   7. Stage 3 chronic kidney disease (HCC)      Plan:   In order of problems listed above:  1. Chronic Combined Systolic and Diastolic CHF - EF previously 20-25%, improved to 30-35% by echo in 02/2018. Recently evaluated in clinic for volume overloaded with diuretics appropriately adjusted. Unfortunately, he developed an AKI and significant hypokalemia with K+ at 2.2 which led to an admission. Was transitioned to PO Lasix 40 mg daily at the time of discharge.  -He reports overall doing well since his hospitalization and says weight has been stable at 179-181 lbs on his home scales. Breathing has been at baseline but he has noticed occasional lower extremity edema. On examination, he appears euvolemic. - Will continue Lasix 40 mg daily with instructions to take an extra tablet as needed for weight gain as outlined below. He has reduced his alcohol intake and has only consumed 2 beers in the past 3 weeks which is likely playing a significant role in how his volume status is remaining stable with low-dose Lasix.  2. CAD  - s/p CABG in 1992 and redo CABG in 2004, cath in 2010 showing patent LIMA-LAD, free RIMA-OM, and SVG-OM, and 80% stenosis of small PLA branch. - He denies any recent chest pain or dyspnea on exertion. - Continue ASA, beta-blocker, and statin therapy.  3. Ischemic Cardiomyopathy  - s/p Medtronic ICD placement 06/2012. - followed by Dr. Royann Shivers.  - continue BB therapy. Will not further titrate at this time given soft BP.   4. Severe AS  - s/p bioprosthetic AVR in 2010 - Recent echocardiogram showed normal functioning of  the prosthesis with no significant stenosis or regurgitation.   5.  PAF  - s/p ablation and not on anticoagulation 2ry to history ofGI bleed andcontinuedalcohol use - Noted to be V-paced during his recent hospitalization. Amiodarone was discontinued at that time  secondary to hypothyroidism. Remains on Coreg 3.125 mg twice daily for rate control.  6. Hypothyroidism - TSH significantly elevated at 55 during recent admission and started on Synthroid. Amiodarone was discontinued at that time. He is scheduled to establish care with a PCP in [place] next week.  7. Stage 3 CKD - Creatinine peaked at 2.51 during recent admission. This had improved to 1.65 on 03/10/2018. Will recheck a BMET today.    Medication Adjustments/Labs and Tests Ordered: Current medicines are reviewed at length with the patient today.  Concerns regarding medicines are outlined above.  Medication changes, Labs and Tests ordered today are listed in the Patient Instructions below. Patient Instructions  Medication Instructions:  Your physician recommends that you continue on your current medications as directed. Please refer to the Current Medication list given to you today.  Labwork: Your physician recommends that you return for lab work Today   Testing/Procedures: NONE   Follow-Up: Your physician recommends that you schedule a follow-up appointment in: 4-6 Weeks with Dr. Royann Shivers.   Any Other Special Instructions Will Be Listed Below (If Applicable).  If you need a refill on your cardiac medications before your next appointment, please call your pharmacy.  Limit daily fluid intake to less than 2 Liters per day. Please limit salt intake.  Please weight yourself every morning.Take an extra Lasix if weight increases by 3 pounds overnight or 5 pounds in a single week.    Signed, Ellsworth Lennox, PA-C  03/26/2018 4:38 PM    [facility] [address] [place], [place] [address] Phone: [phone]

## 2018-03-26 ENCOUNTER — Telehealth: Payer: Self-pay | Admitting: *Deleted

## 2018-03-26 ENCOUNTER — Encounter: Payer: Self-pay | Admitting: Student

## 2018-03-26 ENCOUNTER — Ambulatory Visit: Payer: Medicare Other | Admitting: Student

## 2018-03-26 ENCOUNTER — Other Ambulatory Visit (HOSPITAL_COMMUNITY)
Admission: RE | Admit: 2018-03-26 | Discharge: 2018-03-26 | Disposition: A | Discharge status: Home / Self Care | Payer: Medicare Other | Source: Ambulatory Visit | Attending: Student | Admitting: Student

## 2018-03-26 VITALS — BP 108/62 | HR 90 | Ht 73.0 in | Wt 184.0 lb

## 2018-03-26 DIAGNOSIS — I5042 Chronic combined systolic (congestive) and diastolic (congestive) heart failure: Secondary | ICD-10-CM | POA: Diagnosis present

## 2018-03-26 DIAGNOSIS — Z95810 Presence of automatic (implantable) cardiac defibrillator: Secondary | ICD-10-CM

## 2018-03-26 DIAGNOSIS — N183 Chronic kidney disease, stage 3 unspecified: Secondary | ICD-10-CM

## 2018-03-26 DIAGNOSIS — E039 Hypothyroidism, unspecified: Secondary | ICD-10-CM

## 2018-03-26 DIAGNOSIS — Z952 Presence of prosthetic heart valve: Secondary | ICD-10-CM

## 2018-03-26 DIAGNOSIS — I25810 Atherosclerosis of coronary artery bypass graft(s) without angina pectoris: Secondary | ICD-10-CM | POA: Diagnosis not present

## 2018-03-26 DIAGNOSIS — I480 Paroxysmal atrial fibrillation: Secondary | ICD-10-CM

## 2018-03-26 LAB — BASIC METABOLIC PANEL
ANION GAP: 11 (ref 5–15)
BUN: 17 mg/dL (ref 6–20)
CALCIUM: 9.1 mg/dL (ref 8.9–10.3)
CHLORIDE: 103 mmol/L (ref 101–111)
CO2: 25 mmol/L (ref 22–32)
CREATININE: 1.47 mg/dL — AB (ref 0.61–1.24)
GFR calc non Af Amer: 45 mL/min — ABNORMAL LOW (ref >60)
GFR, EST AFRICAN AMERICAN: 52 mL/min — AB (ref >60)
Glucose, Bld: 90 mg/dL (ref 65–99)
Potassium: 3.8 mmol/L (ref 3.5–5.1)
SODIUM: 139 mmol/L (ref 135–145)

## 2018-03-26 NOTE — Telephone Encounter (Signed)
Called patient with test results. No answer. Left message to call back.  

## 2018-03-26 NOTE — Patient Instructions (Signed)
Medication Instructions:  Your physician recommends that you continue on your current medications as directed. Please refer to the Current Medication list given to you today.   Labwork: Your physician recommends that you return for lab work Today    Testing/Procedures: NONE   Follow-Up: Your physician recommends that you schedule a follow-up appointment in: 4-6 Weeks with Dr. Royann Shiversroitoru.    Any Other Special Instructions Will Be Listed Below (If Applicable).     If you need a refill on your cardiac medications before your next appointment, please call your pharmacy.   Limit daily fluid intake to less than 2 Liters per day. Please limit salt intake.  Please weight yourself every morning.Take an extra Lasix if weight increases by 3 pounds overnight or 5 pounds in a single week.

## 2018-03-26 NOTE — Telephone Encounter (Signed)
-----   Message from Ellsworth LennoxBrittany M Strader, [place]-C sent at 03/26/2018  4:17 PM EDT ----- Please let the patient know his kidney function has continued to improve and creatinine is down to 1.47.  Electrolytes within normal limits. Continue current medication regimen as outlined at today's visit with instructions to take an extra Lasix tablet for weight gain greater than 3 pounds overnight or 5 pounds in 1 week. Thank you.

## 2018-03-27 ENCOUNTER — Other Ambulatory Visit: Payer: Self-pay | Admitting: Cardiovascular Disease

## 2018-04-02 ENCOUNTER — Ambulatory Visit: Payer: Medicare Other | Admitting: Student

## 2018-04-23 ENCOUNTER — Other Ambulatory Visit: Payer: Self-pay | Admitting: Cardiovascular Disease

## 2018-05-10 ENCOUNTER — Encounter: Payer: Self-pay | Admitting: Cardiovascular Disease

## 2018-05-10 ENCOUNTER — Ambulatory Visit (INDEPENDENT_AMBULATORY_CARE_PROVIDER_SITE_OTHER): Payer: Medicare Other | Admitting: Cardiovascular Disease

## 2018-05-10 ENCOUNTER — Encounter: Payer: Medicare Other | Admitting: Cardiovascular Disease

## 2018-05-10 VITALS — BP 133/74 | HR 67 | Ht 72.0 in | Wt 177.6 lb

## 2018-05-10 DIAGNOSIS — N183 Chronic kidney disease, stage 3 unspecified: Secondary | ICD-10-CM

## 2018-05-10 DIAGNOSIS — I5042 Chronic combined systolic (congestive) and diastolic (congestive) heart failure: Secondary | ICD-10-CM | POA: Diagnosis not present

## 2018-05-10 DIAGNOSIS — M1A30X0 Chronic gout due to renal impairment, unspecified site, without tophus (tophi): Secondary | ICD-10-CM

## 2018-05-10 DIAGNOSIS — Z953 Presence of xenogenic heart valve: Secondary | ICD-10-CM

## 2018-05-10 DIAGNOSIS — I495 Sick sinus syndrome: Secondary | ICD-10-CM | POA: Diagnosis not present

## 2018-05-10 DIAGNOSIS — E7800 Pure hypercholesterolemia, unspecified: Secondary | ICD-10-CM | POA: Diagnosis not present

## 2018-05-10 DIAGNOSIS — I712 Thoracic aortic aneurysm, without rupture: Secondary | ICD-10-CM

## 2018-05-10 DIAGNOSIS — Z95810 Presence of automatic (implantable) cardiac defibrillator: Secondary | ICD-10-CM

## 2018-05-10 DIAGNOSIS — D509 Iron deficiency anemia, unspecified: Secondary | ICD-10-CM | POA: Diagnosis not present

## 2018-05-10 DIAGNOSIS — R9431 Abnormal electrocardiogram [ECG] [EKG]: Secondary | ICD-10-CM

## 2018-05-10 DIAGNOSIS — I10 Essential (primary) hypertension: Secondary | ICD-10-CM | POA: Diagnosis not present

## 2018-05-10 DIAGNOSIS — I6523 Occlusion and stenosis of bilateral carotid arteries: Secondary | ICD-10-CM

## 2018-05-10 DIAGNOSIS — I25810 Atherosclerosis of coronary artery bypass graft(s) without angina pectoris: Secondary | ICD-10-CM

## 2018-05-10 DIAGNOSIS — E032 Hypothyroidism due to medicaments and other exogenous substances: Secondary | ICD-10-CM

## 2018-05-10 DIAGNOSIS — I7121 Aneurysm of the ascending aorta, without rupture: Secondary | ICD-10-CM

## 2018-05-10 DIAGNOSIS — I480 Paroxysmal atrial fibrillation: Secondary | ICD-10-CM

## 2018-05-10 LAB — TSH: TSH: 27.89 u[IU]/mL — ABNORMAL HIGH (ref 0.450–4.500)

## 2018-05-10 LAB — COMPREHENSIVE METABOLIC PANEL
ALT: 14 IU/L (ref 0–44)
AST: 18 IU/L (ref 0–40)
Albumin/Globulin Ratio: 1.2 (ref 1.2–2.2)
Albumin: 4.3 g/dL (ref 3.5–4.8)
Alkaline Phosphatase: 81 IU/L (ref 39–117)
BUN/Creatinine Ratio: 12 (ref 10–24)
BUN: 16 mg/dL (ref 8–27)
Bilirubin Total: 0.8 mg/dL (ref 0.0–1.2)
CALCIUM: 10.2 mg/dL (ref 8.6–10.2)
CHLORIDE: 95 mmol/L — AB (ref 96–106)
CO2: 26 mmol/L (ref 20–29)
Creatinine, Ser: 1.3 mg/dL — ABNORMAL HIGH (ref 0.76–1.27)
GFR, EST AFRICAN AMERICAN: 62 mL/min/{1.73_m2} (ref >59)
GFR, EST NON AFRICAN AMERICAN: 53 mL/min/{1.73_m2} — AB (ref >59)
GLUCOSE: 98 mg/dL (ref 65–99)
Globulin, Total: 3.5 g/dL (ref 1.5–4.5)
Potassium: 3.6 mmol/L (ref 3.5–5.2)
Sodium: 139 mmol/L (ref 134–144)
Total Protein: 7.8 g/dL (ref 6.0–8.5)

## 2018-05-10 LAB — CBC
HEMATOCRIT: 34 % — AB (ref 37.5–51.0)
HEMOGLOBIN: 11.1 g/dL — AB (ref 13.0–17.7)
MCH: 27.2 pg (ref 26.6–33.0)
MCHC: 32.6 g/dL (ref 31.5–35.7)
MCV: 83 fL (ref 79–97)
Platelets: 281 10*3/uL (ref 150–450)
RBC: 4.08 x10E6/uL — AB (ref 4.14–5.80)
RDW: 19 % — ABNORMAL HIGH (ref 12.3–15.4)
WBC: 7 10*3/uL (ref 3.4–10.8)

## 2018-05-10 MED ORDER — FERROUS SULFATE 325 (65 FE) MG PO TABS: 325.0000 mg | ORAL_TABLET | Freq: Every day | ORAL | 3 refills | Status: DC

## 2018-05-10 NOTE — Patient Instructions (Addendum)
Medication Instructions: Dr Royann Shivers has recommended making the following medication changes: 1. START Iron 325 mg - take 1 tablet twice daily  Labwork: Your physician recommends that you return for lab work TODAY.  Testing/Procedures: Remote monitoring is used to monitor your Pacemaker of ICD from home. This monitoring reduces the number of office visits required to check your device to one time per year. It allows [place] to keep an eye on the functioning of your device to ensure it is working properly. You are scheduled for a device check from home on Thursday, June 20th, 2019. You may send your transmission at any time that day. If you have a wireless device, the transmission will be sent automatically. After your physician reviews your transmission, you will receive a postcard with your next transmission date.  Follow-up: Your physician recommends that you schedule a follow-up appointment in 2 months with Randall An, PA in [place].  Dr Royann Shivers recommends that you schedule a follow-up appointment in 4 months.  If you need a refill on your cardiac medications before your next appointment, please call your pharmacy.

## 2018-05-10 NOTE — Progress Notes (Signed)
Cardiology Office Note    Date:  05/10/2018   ID:  [name], DOB 08-10-1941, MRN [id number]  PCP:  Patient, No Pcp Per  Cardiologist:   Thurmon Fair, MD   Chief Complaint  Patient presents with  . Dyspnea with mild exertion    History of Present Illness:  Donald BLEW "[name]" is a 77 y.o. male with a long-standing history of CAD with redo CABG, aortic valve replacement with bioprosthesis, ischemic cardiomyopathy, CRT-D, systolic heart failure, bilateral carotid stenoses and endarterectomy, returning for follow-up.  He was hospitalized for congestive heart failure in March.  Echo during that hospitalization showed unchanged EF 30-35% with known extensive inferolateral and apical wall motion abnormality and evidence of elevated filling pressures.  Notable abnormalities at that time were moderate anemia with a hemoglobin of 7.8 and clear evidence of iron deficiency.  Creatinine at baseline is around 1.6, increased to 2.37 by the time of discharge, rechecked on April 5 was down to 1.47.  His hemoglobin has not been rechecked and he has not been receiving iron supplements.  During that hospitalization he was found to have a TSH of 55, free T4 low at 0.5 and his amiodarone was discontinued.  He was started on a very low dose of thyroid supplement 25 mcg levothyroxine.  Since hospital discharge he saw [place] on April 5.  At that appointment he weighed 184 [name] and was felt to be euvolemic.  Today he has lost an additional 7 [name] down to 177 [name].  He is breathing well and feels no limitations physically.  He worked in his garden on relatively hard labor just yesterday.  He denies shortness of breath and has not had any ankle swelling.  Denies any of the typical symptoms of hypothyroidism such as cold intolerance, mental sluggishness, skin changes, change in bowel pattern.  He continues to drink alcohol in larger than recommended amounts.  He is upset after his  57 year old brother passed away suddenly  He has a Medtronic protecta CRT-D device implanted in 2013.  His battery is very close to RRT 6 3 V but not yet triggered RRT.  Is not recorded any episodes of sustained or nonsustained ventricular tachycardia.  He had one episode of paroxysmal atrial fibrillation lasting for several hours in November 2018 before that his previous episode of atrial fibrillation was recorded in 2015.  He is not on anticoagulation because of his excessive use of alcohol.  All 3 leads have excellent sensing and pacing and impedance parameters.  0.6 biventricular pacing also has 37% atrial pacing with good heart rate histogram.  His device shows an activity level of 7 hours/day, confirming his report of good functional status.  His optivol thoracic impedance was out of range throughout March and April, but is back to baseline.  His last echo in November 2018 showed EF 30-35%, normal function of the prosthetic valve, evidence of elevated filling pressures at the time.  The ascending aorta was measured as 5.1 cm, comparable to the CT of the chest performed in June 2018.  Past Medical History:  Diagnosis Date  . Anginal pain (HCC)   . Anxiety   . Aortic stenosis    a. s/p bioprosthetic AVR in 2010  . Arthritis    gout  . CAD (coronary artery disease)    a. s/p CABG in 1992 b. redo CABG in 2004 c. cath in 2010 showing patent LIMA-LAD, free RIMA-OM, and SVG-OM, and 80% stenosis of small PLA branch  .  Chest pain   . CHF (congestive heart failure) (HCC)   . Dysrhythmia   . GERD (gastroesophageal reflux disease)    with gas and bloating probably cause of chest pain  . H/O atrial flutter    a. s/p ablation and not on anticoagulation secondary to GIB and alcohol abuse.   . H/O: gout   . Hyperlipidemia   . Hypertension   . ICD (implantable cardiac defibrillator) infection, s/p removal of ICD 07/12/2012   re-implanted Medtronic BiV ICD, serial number [id number]   . Ischemic  cardiomyopathy    a. EF previously 20-25%, improved to 40-45% by echo in 03/2013 b. 10/2017: echo showing of 30-35% with Grade 2 DD, normal function of AVR, moderate MR.   . Myocardial infarction (HCC)   . 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 Shortness of breath     Past Surgical History:  Procedure Laterality Date  . BI-VENTRICULAR IMPLANTABLE CARDIOVERTER DEFIBRILLATOR Right 08/18/2012   Medtronic BiV ICD, serial number [id number] ; Procedure: BI-VENTRICULAR IMPLANTABLE CARDIOVERTER DEFIBRILLATOR  (CRT-D);  Surgeon: Marinus Maw, MD;  Location[facility] C[facility] LAB;  Service: Cardiovascular;  Laterality: Right;  . CARDIAC CATHETERIZATION  Dec 18, 2003   with a presentation of atrial flutter with rapid ventricular response. He had a cath and two stents to his PDA after his SVG to his RCA. His other graft were patent. He had LV with EF of 30%.   . CAROTID ENDARTERECTOMY  11/2002   left in December 2003 and known renal artery stenosis of 60-70%  . CORONARY ARTERY BYPASS GRAFT  1993   redo in 2000, he has had multiple MI previous to both procedures.  . IMPLANTABLE CARDIOVERTER DEFIBRILLATOR (ICD) GENERATOR CHANGE N/A 06/25/2012   Procedure: ICD GENERATOR CHANGE;  Surgeon: Marinus Maw, MD;  Location: [facility] CATH LAB;  Service: Cardiovascular;  Laterality: N/A;  . VALVE REPLACEMENT  2010    Current Medications: Outpatient Medications Prior to Visit  Medication Sig Dispense Refill  . aspirin EC 81 MG tablet Take 81 mg by mouth daily.     [name] atorvastatin (LIPITOR) 20 MG tablet Take 20 mg by mouth daily.    . carvedilol (COREG) 3.125 MG tablet TAKE 1 TABLET BY MOUTH TWICE A DAY 60 tablet 2  . furosemide (LASIX) 40 MG tablet Take 1 tablet (40 mg total) by mouth daily. 30 tablet 0  . levothyroxine (SYNTHROID, LEVOTHROID) 25 MCG tablet Take 1 tablet (25 mcg total) by mouth daily before breakfast. 30 tablet 0  . nitroGLYCERIN (NITROSTAT) 0.4 MG SL tablet PUT1TAB  UNDER TONGUE EVERY AS NEEDED FOR CHESTPAIN, UP TO 3DOSES. IF NOT RELIEVED, CALL EMS/MD (Patient taking differently: Place 1 tablet (0.4 mg) under the tongue every 5 minutes as needed for chest pain for up to 3 doses/call 9-1-1 and MD if no relief) 25 tablet 0  . potassium chloride (K-DUR,KLOR-CON) 10 MEQ tablet Take 1 tablet (10 mEq total) by mouth daily. 30 tablet 0  . predniSONE (DELTASONE) 20 MG tablet Take 1 tablet (20 mg total) by mouth daily with breakfast. 3 tablet 0  . thiamine 100 MG tablet Take 1 tablet (100 mg total) by mouth daily. 30 tablet 0  . vitamin B-12 (CYANOCOBALAMIN) 1000 MCG tablet Take 1 tablet (1,000 mcg total) by mouth daily.    [name] atorvastatin (LIPITOR) 20 MG tablet TAKE 1 TABLET (20 MG TOTAL) BY MOUTH DAILY. 30 tablet 9   No facility-administered medications prior  to visit.      Allergies:   Atorvastatin and Other   Social History   Socioeconomic History  . Marital status: Married    Spouse name: Not on file  . Number of children: Not on file  . Years of education: Not on file  . Highest education level: Not on file  Occupational History  . Not on file  Social Needs  . Financial resource strain: Not on file  . Food insecurity:    Worry: Not on file    Inability: Not on file  . Transportation needs:    Medical: Not on file    Non-medical: Not on file  Tobacco Use  . Smoking status: Never Smoker  . Smokeless tobacco: Current User    Types: Chew  Substance and Sexual Activity  . Alcohol use: Yes    Alcohol/week: 8.4 oz    Types: 14 Cans of beer per week  . Drug use: No  . Sexual activity: Not on file  Lifestyle  . Physical activity:    Days per week: Not on file    Minutes per session: Not on file  . Stress: Not on file  Relationships  . Social connections:    Talks on phone: Not on file    Gets together: Not on file    Attends religious service: Not on file    Active member of club or organization: Not on file    Attends meetings of  clubs or organizations: Not on file    Relationship status: Not on file  Other Topics Concern  . Not on file  Social History Narrative  . Not on file       ROS:   Please see the history of present illness.    ROS All other systems reviewed and are negative.   PHYSICAL EXAM:   VS:  BP 133/74   Pulse 67   Ht 6' (1.829 m)   Wt 177 lb 9.6 oz (80.6 kg)   SpO2 97%   BMI 24.09 kg/m     General: Alert, oriented x3, no distress, he appears well and looks fit. Head: no evidence of trauma, PERRL, EOMI, no exophtalmos or lid lag, no myxedema, no xanthelasma; normal ears, nose and oropharynx Neck: normal jugular venous pulsations and no hepatojugular reflux; brisk carotid pulses without delay and no carotid bruits Chest: clear to auscultation, no signs of consolidation by percussion or palpation, normal fremitus, symmetrical and full respiratory excursions, well-healed scar of previous device in the left subclavian area, healthy-appearing right subclavian CRT-D site Cardiovascular: normal position and quality of the apical impulse, regular rhythm, normal first and paradoxically split second heart sounds, 1/6 early peaking aortic ejection murmur, no diastolic murmurs, rubs or gallops Abdomen: no tenderness or distention, no masses by palpation, no abnormal pulsatility or arterial bruits, normal bowel sounds, no hepatosplenomegaly Extremities: no clubbing, cyanosis or edema; 2+ radial, ulnar and brachial pulses bilaterally; 2+ right femoral, posterior tibial and dorsalis pedis pulses; 2+ left femoral, posterior tibial and dorsalis pedis pulses; no subclavian or femoral bruits Neurological: grossly nonfocal Psych: Normal mood and affect    Wt Readings from Last 3 Encounters:  05/10/18 177 lb 9.6 oz (80.6 kg)  03/26/18 184 lb (83.5 kg)  03/10/18 179 lb (81.2 kg)      Studies/Labs Reviewed:   EKG:  EKG is not ordered today.  Recent Labs: 01/28/2018: NT-Pro BNP 3,838 03/07/2018: ALT 10;  TSH 55.222 03/08/2018: B Natriuretic Peptide 1,000.1; Magnesium 2.3 03/10/2018: Hemoglobin  7.7; Platelets 327 03/26/2018: BUN 17; Creatinine, Ser 1.47; Potassium 3.8; Sodium 139   Lipid Panel    Component Value Date/Time   CHOL 158 12/19/2016 0841   TRIG 74 12/19/2016 0841   HDL 38 (L) 12/19/2016 0841   CHOLHDL 4.2 12/19/2016 0841   VLDL 15 12/19/2016 0841   LDLCALC 105 (H) 12/19/2016 0841    Echo March 07, 2018 - Left ventricle: The cavity size was mildly dilated. There was   mild concentric hypertrophy. Systolic function was moderately to   severely reduced. The estimated ejection fraction was in the   range of 30% to 35%. Diffuse hypokinesis worse in the   anterolateral, inferolateral, inferior, and apical myocardium.   Doppler parameters are consistent with a reversible restrictive   pattern, indicative of decreased left ventricular diastolic   compliance and/or increased left atrial pressure (grade 3   diastolic dysfunction). Doppler parameters are consistent with   high ventricular filling pressure. - Aortic valve: A bioprosthesis was present. Valve area (VTI): 2.02   cm^2. Valve area (Vmax): 2.02 cm^2. Valve area (Vmean): 1.95   cm^2. - Mitral valve: Transvalvular velocity was within the normal range.   There was no evidence for stenosis. There was trivial   regurgitation. - Left atrium: The atrium was severely dilated. - Right ventricle: The cavity size was normal. Wall thickness was   normal. Systolic function was mildly reduced. - Right atrium: The atrium was moderately dilated. - Atrial septum: No defect or patent foramen ovale was identified   by color flow Doppler. - Tricuspid valve: There was mild regurgitation. - Pulmonary arteries: Systolic pressure was increased. PA peak   pressure: 44 mm Hg (S).  ------------------------------------------------------------------- Labs, prior tests, procedures, and surgery: H/O aortic valve replacement- 29 mm Mosaic CAD  s/p redo CABG.   ASSESSMENT:    1. Chronic combined systolic and diastolic heart failure (HCC)   2. CAD of autologous vein bypass graft without angina   3. Cardiac resynchronization therapy defibrillator (CRT-D) in place   4. Hypercholesterolemia   5. Essential hypertension   6. Bilateral carotid artery stenosis   7. Paroxysmal atrial fibrillation (HCC)   8. History of aortic valve replacement with bioprosthetic valve   9. Ascending aortic aneurysm (HCC)   10. CKD (chronic kidney disease) stage 3, GFR 30-59 ml/min (HCC)   11. Chronic gout due to renal impairment without tophus, unspecified site   12. Iron deficiency anemia, unspecified iron deficiency anemia type   13. Hypothyroidism due to medication      PLAN:  In order of problems listed above:  1. CHF: Appears to be clinically euvolemic, at "dry weight".  He has not required metolazone recently.  Some confusion about the appropriate dosage of his potassium.  Most recent creatinine was back to baseline.  We will recheck labs today.  Excellent functional status 2. CAD s/p redo CABG: No complaints of angina pectoris.  On statin. (cath in 2010 showing patent LIMA-LAD, free RIMA-OM, and SVG-OM, and 80% stenosis of small PLA branch). 3. CRT-D: comprehensive device check today shows normal findings.  Very close to time for generator change.  Make sure that we get routine downloads from his device on a monthly basis until it is time for generator change out.  Consider implanting an antibiotic TyRx pouch at the time of generator change in view of previous history of device infection on the contralateral side. 4. HLP: LDL cholesterol has not been rechecked since December 2017.  Compliance with statin has  been spotty and will not recheck today. 5. HTN: Close to desirable target range 6. Carotid disease s/p bilateral carotid endarterectomies. No recent neurological complaints.  7. AFib: Continued alcohol use and poor compliance and previous GI  bleeding make him a poor candidate for anticoagulation.  His episodes of atrial fibrillation are extremely infrequent, occurring in 2015 and November 2018.  Now off amiodarone, which may lead to more frequent episodes of atrial fibrillation.  8. S/P AVR: March 2019 echo shows normal bioprosthetic valve function. 9. Ascending aortic aneurysm: Stable size.  5.0 cm by CT angiogram of the chest in June 2018, 5.1 cm in March 2019.  Consider referral to cardiac surgery, but he is not a great candidate for 3rd thoracotomy. 10. CKD 3: Transient worsening during his recent hospitalization due to diuretics and may be also administration of contrast for CT angiogram of the aorta.  In April was back to baseline.  We will recheck today 11. Gout: No recent gout attacks, white volatile renal function and diuresis 12. Fe def anemia: Needs to start iron supplements and will recheck his CBC.  This may have contributed to heart failure exacerbation. 13. Hypothyroidism: Started on supplements and amiodarone discontinued roughly 2 months ago.  Time to recheck TSH. 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 Asymptomatic.    Medication Adjustments/Labs and Tests Ordered: Current medicines are reviewed at length with the patient today.  Concerns regarding medicines are outlined above.  Medication changes, Labs and Tests ordered today are listed in the Patient Instructions below. Patient Instructions  Medication Instructions: Dr Royann Shivers has recommended making the following medication changes: 1. START Iron 325 mg - take 1 tablet twice daily  Labwork: Your physician recommends that you return for lab work TODAY.  Testing/Procedures: Remote monitoring is used to monitor your Pacemaker of ICD from home. This monitoring reduces the number of office visits required to check your device to one time per year. It allows [place] to keep an eye  on the functioning of your device to ensure it is working properly. You are scheduled for a device check from home on Thursday, June 20th, 2019. You may send your transmission at any time that day. If you have a wireless device, the transmission will be sent automatically. After your physician reviews your transmission, you will receive a postcard with your next transmission date.  Follow-up: Your physician recommends that you schedule a follow-up appointment in 2 months with Randall An, PA in [place].  Dr Royann Shivers recommends that you schedule a follow-up appointment in 4 months.  If you need a refill on your cardiac medications before your next appointment, please call your pharmacy.    Signed, Thurmon Fair, MD  05/10/2018 2:37 PM    [facility] [address] [place], [place], [place]  [address] Phone: [phone]; Fax: [phone]

## 2018-05-25 ENCOUNTER — Telehealth: Payer: Self-pay | Admitting: Cardiovascular Disease

## 2018-05-25 MED ORDER — POTASSIUM CHLORIDE CRYS ER 10 MEQ PO TBCR: 10.0000 meq | EXTENDED_RELEASE_TABLET | Freq: Every day | ORAL | 5 refills | Status: DC

## 2018-05-25 NOTE — Telephone Encounter (Signed)
New Message:      Pt c/o medication issue:  1. Name of Medication: potassium chloride (K-DUR,KLOR-CON) 10 MEQ tablet  2. How are you currently taking this medication (dosage and times per day)?  Take 1 tablet (10 mEq total) by mouth daily. 3. Are you having a reaction (difficulty breathing--STAT)? No  4. What is your medication issue? [facility] states the pt has no refills and pt got his first dosage from hospital.

## 2018-05-25 NOTE — Telephone Encounter (Signed)
Spoke with [facility] and advised that per last office visit pt was to continue with current medication. Prescription was e-sent for Potassium 10 meq.

## 2018-06-10 ENCOUNTER — Telehealth: Payer: Self-pay | Admitting: Cardiology

## 2018-06-10 ENCOUNTER — Encounter: Payer: Medicare Other | Admitting: *Deleted

## 2018-06-10 NOTE — Telephone Encounter (Signed)
LMOVM reminding pt to send remote transmission.   

## 2018-06-11 ENCOUNTER — Encounter: Payer: Self-pay | Admitting: Cardiology

## 2018-06-18 ENCOUNTER — Telehealth: Payer: Self-pay

## 2018-06-18 DIAGNOSIS — E785 Hyperlipidemia, unspecified: Secondary | ICD-10-CM

## 2018-06-18 DIAGNOSIS — I480 Paroxysmal atrial fibrillation: Secondary | ICD-10-CM

## 2018-06-18 MED ORDER — LEVOTHYROXINE SODIUM 50 MCG PO TABS: 50.0000 ug | ORAL_TABLET | Freq: Every day | ORAL | 3 refills | Status: DC

## 2018-06-18 NOTE — Telephone Encounter (Signed)
Several attempts have been made to reach pt by phone. Results, repeat blood work, and updated prescription mailed to pt.

## 2018-06-18 NOTE — Telephone Encounter (Signed)
-----   Message from Thurmon FairMihai Croitoru, MD sent at 05/10/2018  5:37 PM EDT ----- Kidney function best it has been a long time Blood counts improving quickly, still mildly anemic Thyroid function also improving, but not yet normal.  Please increase levothyroxine to 50 mcg daily and recheck TSH in 6 weeks.  Recheck a lipid profile at the same time

## 2018-06-19 ENCOUNTER — Other Ambulatory Visit: Payer: Self-pay | Admitting: Cardiovascular Disease

## 2018-06-30 ENCOUNTER — Encounter: Payer: Medicare Other | Admitting: Cardiovascular Disease

## 2018-07-06 ENCOUNTER — Ambulatory Visit: Payer: Medicare Other | Admitting: *Deleted

## 2018-07-07 LAB — CUP PACEART INCLINIC DEVICE CHECK
Battery Voltage: 2.61 V
Brady Statistic AP VP Percent: 38 %
Brady Statistic AP VS Percent: 0.1 %
Brady Statistic AS VP Percent: 61.7 %
HighPow Impedance: 74 Ohm
Implantable Lead Location: 753858
Implantable Lead Location: 753859
Implantable Lead Model: 4194
Implantable Lead Model: 6935
Lead Channel Impedance Value: 437 Ohm
Lead Channel Impedance Value: 703 Ohm
Lead Channel Pacing Threshold Amplitude: 1 V
Lead Channel Pacing Threshold Pulse Width: 0.4 ms
Lead Channel Pacing Threshold Pulse Width: 0.4 ms
Lead Channel Pacing Threshold Pulse Width: 0.4 ms
Lead Channel Setting Pacing Amplitude: 2.25 V
Lead Channel Setting Pacing Pulse Width: 0.4 ms
Lead Channel Setting Sensing Sensitivity: 0.3 mV
MDC IDC LEAD IMPLANT DT: 20130828
MDC IDC LEAD IMPLANT DT: 20130828
MDC IDC LEAD IMPLANT DT: 20130828
MDC IDC LEAD LOCATION: 753860
MDC IDC MSMT LEADCHNL RA PACING THRESHOLD AMPLITUDE: 0.5 V
MDC IDC MSMT LEADCHNL RA SENSING INTR AMPL: 1.1 mV
MDC IDC MSMT LEADCHNL RV IMPEDANCE VALUE: 342 Ohm
MDC IDC MSMT LEADCHNL RV PACING THRESHOLD AMPLITUDE: 1 V
MDC IDC MSMT LEADCHNL RV SENSING INTR AMPL: 6 mV
MDC IDC PG IMPLANT DT: 20130828
MDC IDC SESS DTM: 20190717100658
MDC IDC SET LEADCHNL RA PACING AMPLITUDE: 1.5 V
MDC IDC SET LEADCHNL RV PACING AMPLITUDE: 2 V
MDC IDC SET LEADCHNL RV PACING PULSEWIDTH: 0.4 ms
MDC IDC STAT BRADY AS VS PERCENT: 0.3 %

## 2018-07-09 ENCOUNTER — Telehealth: Payer: Self-pay | Admitting: Cardiovascular Disease

## 2018-07-09 NOTE — Telephone Encounter (Signed)
Patient had an office visit scheduled for 07/15/18 with Randall An[place]ittany Strader, [place] in [place]. Per Dr C, I rescheduled this appt to be within 30 days of his generator changeout (09/08/18).  Patient notified of change in appt date/time.

## 2018-07-09 NOTE — Telephone Encounter (Signed)
Spoke with pt pt asked if he needed an apt with Dr. Salena Saner prior to surgery informed him that in some cases Dr. Salena Saner goes over the surgery with risks and benefits at the hospital. Informed pt that I would route this to Dr. Salena Saner covering for clarification asked pt if he would prefer an OV prior to surgery pt stated that it did not matter one way or the other to him.

## 2018-07-09 NOTE — Telephone Encounter (Signed)
New message   Patient calling with questions about generator change

## 2018-07-09 NOTE — Telephone Encounter (Signed)
[facility]

## 2018-07-15 ENCOUNTER — Ambulatory Visit: Payer: Medicare Other | Admitting: Student

## 2018-07-23 ENCOUNTER — Other Ambulatory Visit: Payer: Self-pay | Admitting: Cardiovascular Disease

## 2018-07-24 ENCOUNTER — Other Ambulatory Visit: Payer: Self-pay | Admitting: Cardiovascular Disease

## 2018-08-14 ENCOUNTER — Other Ambulatory Visit: Payer: Self-pay | Admitting: Cardiovascular Disease

## 2018-08-17 ENCOUNTER — Encounter: Payer: Self-pay | Admitting: Student

## 2018-08-17 ENCOUNTER — Ambulatory Visit: Payer: Medicare Other | Admitting: Student

## 2018-08-17 VITALS — BP 132/78 | HR 65 | Ht 72.0 in | Wt 168.0 lb

## 2018-08-17 DIAGNOSIS — N183 Chronic kidney disease, stage 3 unspecified: Secondary | ICD-10-CM

## 2018-08-17 DIAGNOSIS — I5042 Chronic combined systolic (congestive) and diastolic (congestive) heart failure: Secondary | ICD-10-CM

## 2018-08-17 DIAGNOSIS — I2510 Atherosclerotic heart disease of native coronary artery without angina pectoris: Secondary | ICD-10-CM | POA: Diagnosis not present

## 2018-08-17 DIAGNOSIS — I712 Thoracic aortic aneurysm, without rupture: Secondary | ICD-10-CM

## 2018-08-17 DIAGNOSIS — Z95810 Presence of automatic (implantable) cardiac defibrillator: Secondary | ICD-10-CM | POA: Diagnosis not present

## 2018-08-17 DIAGNOSIS — Z952 Presence of prosthetic heart valve: Secondary | ICD-10-CM

## 2018-08-17 DIAGNOSIS — E039 Hypothyroidism, unspecified: Secondary | ICD-10-CM

## 2018-08-17 DIAGNOSIS — I7121 Aneurysm of the ascending aorta, without rupture: Secondary | ICD-10-CM

## 2018-08-17 DIAGNOSIS — I480 Paroxysmal atrial fibrillation: Secondary | ICD-10-CM

## 2018-08-17 NOTE — H&P (View-Only) (Signed)
 Cardiology Office Note    Date:  08/17/2018   ID:  [name], DOB 06/03/1941, MRN [id number]  PCP:  Patient, No Pcp Per  Cardiologist: Mihai Croitoru, MD    Chief Complaint  Patient presents with  . Follow-up    2 month visit; upcoming gen change    History of Present Illness:    [name] is a 77 y.o. male with past medical history of CAD (s/p CABG in 1992 and redo CABG in 2004, cath in 2010 showing patent LIMA-LAD, free RIMA-OM, and SVG-OM, and 80% stenosis of small PLA branch), chronic combined systolic and diastolicCHF, ischemic cardiomyopathy (EF previously 20-25%, at 30-35% by echo in 02/2018, s/p Medtronic ICD placement 06/2012), severe AS (s/p bioprosthetic AVR in 2010), PAD (s/p bilateral CEA), PAF (s/p ablation and not on anticoagulation 2ry to history ofGI bleed andcontinuedalcohol use),Stage 3 CKD, Hypothyroidismand AAA (5 cm by CT in 05/2017) who presents to the office today for 2-month follow-up and to review ICD generator change.   He was last examined by Dr. Croitoru on 05/10/2018 and reported his respiratory status had significantly improved and weight was actually down to 177 lbs. Was continued on his current medication regimen at that time. Device interrogation in 06/2018 showed he was Bi-V pacing 99.9% of the time and ERI would be on 07/02/2018 with reliable device function until 10/02/2018, therefore a generator change has been scheduled for 09/08/2018 with Dr. Croitoru.   In talking with the patient today, he reports overall doing very well since his last office visit. 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 He denies any recent chest pain or dyspnea on exertion. No recent orthopnea, PND, lower extremity edema, or palpitations. He still consumes "a few beers a day" by his report. Says that weight has overall been stable when checked at home and this is actually down to 168 lbs on the office scales  today. Reports he has been taking Lasix 40 mg daily and has been compliant with this.  He was started on iron supplementation at the time of his last office visit and feels like this has helped with his energy. Synthroid dosing was also adjusted at the time of his last office visit but he has not had repeat labs since.    Past Medical History:  Diagnosis Date  . Anginal pain (HCC)   . Anxiety   . Aortic stenosis    a. s/p bioprosthetic AVR in 2010  . Arthritis    gout  . CAD (coronary artery disease)    a. s/p CABG in 1992 b. redo CABG in 2004 c. cath in 2010 showing patent LIMA-LAD, free RIMA-OM, and SVG-OM, and 80% stenosis of small PLA branch  . Chest pain   . CHF (congestive heart failure) (HCC)   . Dysrhythmia   . GERD (gastroesophageal reflux disease)    with gas and bloating probably cause of chest pain  . H/O atrial flutter    a. s/p ablation and not on anticoagulation secondary to GIB and alcohol abuse.   . H/O: gout   . Hyperlipidemia   . Hypertension   . ICD (implantable cardiac defibrillator) infection, s/p removal of ICD 07/12/2012   re-implanted Medtronic BiV ICD, serial number [id number]   . Ischemic cardiomyopathy    a. EF previously 20-25%, improved to 40-45% by echo in 03/2013 b. 10/2017: echo showing of 30-35% with Grade 2 DD, normal function of   AVR, moderate MR.   . Myocardial infarction (HCC)   . 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 Shortness of breath     Past Surgical History:  Procedure Laterality Date  . BI-VENTRICULAR IMPLANTABLE CARDIOVERTER DEFIBRILLATOR Right 08/18/2012   Medtronic BiV ICD, serial number [id number] ; Procedure: BI-VENTRICULAR IMPLANTABLE CARDIOVERTER DEFIBRILLATOR  (CRT-D);  Surgeon: Gregg W Taylor, MD;  Location: [facility];  Service: Cardiovascular;  Laterality: Right;  . CARDIAC CATHETERIZATION  Dec 18, 2003   with a presentation of atrial flutter with rapid ventricular response. He had  a cath and two stents to his PDA after his SVG to his RCA. His other graft were patent. He had LV with EF of 30%.   . CAROTID ENDARTERECTOMY  11/2002   left in December 2003 and known renal artery stenosis of 60-70%  . CORONARY ARTERY BYPASS GRAFT  1993   redo in 2000, he has had multiple MI previous to both procedures.  . IMPLANTABLE CARDIOVERTER DEFIBRILLATOR (ICD) GENERATOR CHANGE N/A 06/25/2012   Procedure: ICD GENERATOR CHANGE;  Surgeon: Gregg W Taylor, MD;  Location: [facility];  Service: Cardiovascular;  Laterality: N/A;  . VALVE REPLACEMENT  2010    Current Medications: Outpatient Medications Prior to Visit  Medication Sig Dispense Refill  . aspirin EC 81 MG tablet Take 81 mg by mouth daily.     . atorvastatin (LIPITOR) 20 MG tablet TAKE 1 TABLET (20 MG TOTAL) BY MOUTH DAILY. 90 tablet 1  . carvedilol (COREG) 3.125 MG tablet TAKE 1 TABLET BY MOUTH TWICE A DAY 60 tablet 2  . ferrous sulfate (IRON SUPPLEMENT) 325 (65 FE) MG tablet Take 1 tablet (325 mg total) by mouth daily with breakfast. 180 tablet 3  . furosemide (LASIX) 40 MG tablet Take 1 tablet (40 mg total) by mouth daily. 30 tablet 0  . levothyroxine (SYNTHROID, LEVOTHROID) 50 MCG tablet Take 1 tablet (50 mcg total) by mouth daily before breakfast. 30 tablet 3  . nitroGLYCERIN (NITROSTAT) 0.4 MG SL tablet Place 1 tablet (0.4 mg) under the tongue every 5 minutes as needed for chest pain for up to 3 doses/call 9-1-1 and MD if no relief 25 tablet 0  . potassium chloride (K-DUR,KLOR-CON) 10 MEQ tablet Take 1 tablet (10 mEq total) by mouth daily. 30 tablet 5  . predniSONE (DELTASONE) 20 MG tablet Take 1 tablet (20 mg total) by mouth daily with breakfast. 3 tablet 0  . thiamine 100 MG tablet Take 1 tablet (100 mg total) by mouth daily. 30 tablet 0  . vitamin B-12 (CYANOCOBALAMIN) 1000 MCG tablet Take 1 tablet (1,000 mcg total) by mouth daily.     No facility-administered medications prior to visit.      Allergies:   Atorvastatin  and Other   Social History   Socioeconomic History  . Marital status: Married    Spouse name: Not on file  . Number of children: Not on file  . Years of education: Not on file  . Highest education level: Not on file  Occupational History  . Not on file  Social Needs  . Financial resource strain: Not on file  . Food insecurity:    Worry: Not on file    Inability: Not on file  . Transportation needs:    Medical: Not on file    Non-medical: Not on file  Tobacco Use  . Smoking status: Never Smoker  . Smokeless tobacco: Current User      Types: Chew  Substance and Sexual Activity  . Alcohol use: Yes    Alcohol/week: 14.0 standard drinks    Types: 14 Cans of beer per week  . Drug use: No  . Sexual activity: Not on file  Lifestyle  . Physical activity:    Days per week: Not on file    Minutes per session: Not on file  . Stress: Not on file  Relationships  . Social connections:    Talks on phone: Not on file    Gets together: Not on file    Attends religious service: Not on file    Active member of club or organization: Not on file    Attends meetings of clubs or organizations: Not on file    Relationship status: Not on file  Other Topics Concern  . Not on file  Social History Narrative  . Not on file     Family History:  The patient's family history includes CAD in his brother.   Review of Systems:   Please see the history of present illness.     General:  No chills, fever, night sweats or weight changes. Positive for fatigue (improved).  Cardiovascular:  No chest pain, dyspnea on exertion, edema, orthopnea, palpitations, paroxysmal nocturnal dyspnea. Dermatological: No rash, lesions/masses Respiratory: No cough, dyspnea Urologic: No hematuria, dysuria Abdominal:   No nausea, vomiting, diarrhea, bright red blood per rectum, melena, or hematemesis Neurologic:  No visual changes, wkns, changes in mental status. All other systems reviewed and are otherwise negative  except as noted above.   Physical Exam:    VS:  BP 132/78   Pulse 65   Ht 6' (1.829 m)   Wt 168 lb (76.2 kg)   SpO2 99%   BMI 22.78 kg/m    General: Well developed, well nourished Caucasian male appearing in no acute distress. Head: Normocephalic, atraumatic, sclera non-icteric, no xanthomas, nares are without discharge.  Neck: No carotid bruits. JVD not elevated.  Lungs: Respirations regular and unlabored, without wheezes or rales.  Heart: Regular rate and rhythm. No S3 or S4.  No rubs or gallops appreciated. 2/6 SEM along RUSB.  Abdomen: Soft, non-tender, non-distended with normoactive bowel sounds. No hepatomegaly. No rebound/guarding. No obvious abdominal masses. Msk:  Strength and tone appear normal for age. No joint deformities or effusions. Extremities: No clubbing or cyanosis. No lower extremity edema.  Distal pedal pulses are 2+ bilaterally. Neuro: Alert and oriented X 3. Moves all extremities spontaneously. No focal deficits noted. Psych:  Responds to questions appropriately with a normal affect. Skin: No rashes or lesions noted  Wt Readings from Last 3 Encounters:  08/17/18 168 lb (76.2 kg)  05/10/18 177 lb 9.6 oz (80.6 kg)  03/26/18 184 lb (83.5 kg)     Studies/Labs Reviewed:   EKG:  EKG is not ordered today.    Recent Labs: 01/28/2018: NT-Pro BNP 3,838 03/08/2018: B Natriuretic Peptide 1,000.1; Magnesium 2.3 05/10/2018: ALT 14; BUN 16; Creatinine, Ser 1.30; Hemoglobin 11.1; Platelets 281; Potassium 3.6; Sodium 139; TSH 27.890   Lipid Panel    Component Value Date/Time   CHOL 158 12/19/2016 0841   TRIG 74 12/19/2016 0841   HDL 38 (L) 12/19/2016 0841   CHOLHDL 4.2 12/19/2016 0841   VLDL 15 12/19/2016 0841   LDLCALC 105 (H) 12/19/2016 0841    Additional studies/ records that were reviewed today include:   Echocardiogram: 03/07/2018 Study Conclusions  - Left ventricle: The cavity size was mildly dilated. There was   mild   concentric hypertrophy. Systolic  function was moderately to   severely reduced. The estimated ejection fraction was in the   range of 30% to 35%. Diffuse hypokinesis worse in the   anterolateral, inferolateral, inferior, and apical myocardium.   Doppler parameters are consistent with a reversible restrictive   pattern, indicative of decreased left ventricular diastolic   compliance and/or increased left atrial pressure (grade 3   diastolic dysfunction). Doppler parameters are consistent with   high ventricular filling pressure. - Aortic valve: A bioprosthesis was present. Valve area (VTI): 2.02   cm^2. Valve area (Vmax): 2.02 cm^2. Valve area (Vmean): 1.95   cm^2. - Mitral valve: Transvalvular velocity was within the normal range.   There was no evidence for stenosis. There was trivial   regurgitation. - Left atrium: The atrium was severely dilated. - Right ventricle: The cavity size was normal. Wall thickness was   normal. Systolic function was mildly reduced. - Right atrium: The atrium was moderately dilated. - Atrial septum: No defect or patent foramen ovale was identified   by color flow Doppler. - Tricuspid valve: There was mild regurgitation. - Pulmonary arteries: Systolic pressure was increased. PA peak   pressure: 44 mm Hg (S).  Assessment:    1. Chronic combined systolic and diastolic heart failure (HCC)   2. Coronary artery disease involving native coronary artery of native heart without angina pectoris   3. Cardiac resynchronization therapy defibrillator (CRT-D) in place   4. H/O aortic valve replacement   5. Paroxysmal atrial fibrillation (HCC)   6. Hypothyroidism, unspecified type   7. Stage 3 chronic kidney disease (HCC)   8. Ascending aortic aneurysm (HCC)      Plan:   In order of problems listed above:  1. Chronic Combined Systolic and Diastolic CHF - He has a known reduced EF of 30 to 35% by echocardiogram in 02/2018. He has overall been doing very well from a volume perspective and denies  any recent dyspnea on exertion, orthopnea, PND, or lower extremity edema. His weight is at the lowest it has been over the past year by review of office notes. - Will continue on Lasix 40 mg daily and Carvedilol 3.125 mg twice daily. He was previously intolerant to ACE-I and ARB's which led to self-discontinuation of the medications.   2. CAD - s/p CABG in 1992 and redo CABG in 2004. Most recent catheterization in 2010 showed patent LIMA-LAD, free RIMA-OM, and SVG-OM, and 80% stenosis of small PLA branch. - He denies any recent chest pain or dyspnea on exertion. - Continue ASA, statin, and beta-blocker therapy.  3. Ischemic Cardiomyopathy/ CRT-D In Place - s/p Medtronic ICD placement in 06/2012. He is due for upcoming generator change out as outlined above. Risks and benefits of the procedure were reviewed with the patient and he agrees to proceed. This is scheduled with Dr. Croitoru on 09/08/2018. Instructions for the use of chlorhexidine scrub were reviewed with the patient as well and this was provided at the time of his visit.  4. Aortic Stenosis - s/p bioprosthetic AVR in 2010. Echocardiogram in 02/2018 showed normal function of the bioprosthesis.  5. Paroxysmal Atrial Fibrillation - He denies any recent palpitations and heart rate is well controlled in the 60's during today's visit. He has not been placed on anticoagulation due to the infrequency of his episodes and continued alcohol use.  6. Hypothyroidism - this has been followed by Dr. Croitoru as the patient has not yet established with a PCP. TSH was   previously elevated to 55 in 02/2018, improved to 27 in 04/2018. Will obtain a repeat TSH at the time of his repeat labs. He is currently on Synthroid 50 mcg daily. The importance of establishing with a PCP was again reviewed at today's visit.   7. Stage 3 CKD - creatinine improved to 1.30 when last checked on 05/10/2018. Will obtain a repeat BMET in preparation for his upcoming  procedure.  8. AAA - at 5.1 cm by imaging in 02/2018. Will continue to follow.  Medication Adjustments/Labs and Tests Ordered: Current medicines are reviewed at length with the patient today.  Concerns regarding medicines are outlined above.  Medication changes, Labs and Tests ordered today are listed in the Patient Instructions below. Patient Instructions  Medication Instructions:  Your physician recommends that you continue on your current medications as directed. Please refer to the Current Medication list given to you today.  Labwork: Pre-procedure labs  Testing/Procedures: NONE   Follow-Up: Your physician recommends that you schedule a follow-up appointment in: 8-10 Weeks with Dr. Croitoru    Any Other Special Instructions Will Be Listed Below (If Applicable).   If you need a refill on your cardiac medications before your next appointment, please call your pharmacy.  Thank you for choosing [facility]!    Signed, Keyoni Lapinski M Math Brazie, PA-C  08/17/2018 5:40 PM    [facility] [address] [place], [place] [address] Phone: [phone]   

## 2018-08-17 NOTE — Patient Instructions (Signed)
Medication Instructions:  Your physician recommends that you continue on your current medications as directed. Please refer to the Current Medication list given to you today.   Labwork: NONE   Testing/Procedures: NONE   Follow-Up: Your physician recommends that you schedule a follow-up appointment in: 8-10 Weeks with Dr. Royann Shiversroitoru    Any Other Special Instructions Will Be Listed Below (If Applicable).     If you need a refill on your cardiac medications before your next appointment, please call your pharmacy.  Thank you for choosing [facility]!

## 2018-08-17 NOTE — Progress Notes (Signed)
Cardiology Office Note    Date:  08/17/2018   ID:  Donald AmasDonald R Dutt, DOB 21-Jul-1941, MRN [id number]  PCP:  Patient, No Pcp Per  Cardiologist: Thurmon FairMihai Croitoru, MD    Chief Complaint  Patient presents with  . Follow-up    2 month visit; upcoming gen change    History of Present Illness:    [name] is a 77 y.o. male with past medical history of CAD (s/p CABG in 1992 and redo CABG in 2004, cath in 2010 showing patent LIMA-LAD, free RIMA-OM, and SVG-OM, and 80% stenosis of small PLA branch), chronic combined systolic and diastolicCHF, ischemic cardiomyopathy (EF previously 20-25%, at 30-35% by echo in 02/2018, s/p Medtronic ICD placement 06/2012), severe AS (s/p bioprosthetic AVR in 2010), PAD (s/p bilateral CEA), PAF (s/p ablation and not on anticoagulation 2ry to history ofGI bleed andcontinuedalcohol use),Stage 3 CKD, Hypothyroidismand AAA (5 cm by CT in 05/2017) who presents to the office today for 5020-month follow-up and to review ICD generator change.   He was last examined by Dr. Royann Shiversroitoru on 05/10/2018 and reported his respiratory status had significantly improved and weight was actually down to 177 lbs. Was continued on his current medication regimen at that time. Device interrogation in 06/2018 showed he was Bi-V pacing 99.9% of the time and ERI would be on 07/02/2018 with reliable device function until 10/02/2018, therefore a generator change has been scheduled for 09/08/2018 with Dr. Royann Shiversroitoru.   In talking with the patient today, he reports overall doing very well since his last office visit. 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 He denies any recent chest pain or dyspnea on exertion. No recent orthopnea, PND, lower extremity edema, or palpitations. He still consumes "a few beers a day" by his report. Says that weight has overall been stable when checked at home and this is actually down to 168 lbs on the office scales  today. Reports he has been taking Lasix 40 mg daily and has been compliant with this.  He was started on iron supplementation at the time of his last office visit and feels like this has helped with his energy. Synthroid dosing was also adjusted at the time of his last office visit but he has not had repeat labs since.    Past Medical History:  Diagnosis Date  . Anginal pain (HCC)   . Anxiety   . Aortic stenosis    a. s/p bioprosthetic AVR in 2010  . Arthritis    gout  . CAD (coronary artery disease)    a. s/p CABG in 1992 b. redo CABG in 2004 c. cath in 2010 showing patent LIMA-LAD, free RIMA-OM, and SVG-OM, and 80% stenosis of small PLA branch  . Chest pain   . CHF (congestive heart failure) (HCC)   . Dysrhythmia   . GERD (gastroesophageal reflux disease)    with gas and bloating probably cause of chest pain  . H/O atrial flutter    a. s/p ablation and not on anticoagulation secondary to GIB and alcohol abuse.   . H/O: gout   . Hyperlipidemia   . Hypertension   . ICD (implantable cardiac defibrillator) infection, s/p removal of ICD 07/12/2012   re-implanted Medtronic BiV ICD, serial number [id number]   . Ischemic cardiomyopathy    a. EF previously 20-25%, improved to 40-45% by echo in 03/2013 b. 10/2017: echo showing of 30-35% with Grade 2 DD, normal function of  AVR, moderate MR.   . Myocardial infarction (HCC)   . 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 Shortness of breath     Past Surgical History:  Procedure Laterality Date  . BI-VENTRICULAR IMPLANTABLE CARDIOVERTER DEFIBRILLATOR Right 08/18/2012   Medtronic BiV ICD, serial number [id number] ; Procedure: BI-VENTRICULAR IMPLANTABLE CARDIOVERTER DEFIBRILLATOR  (CRT-D);  Surgeon: Marinus Maw, MD;  Location: [facility] CATH LAB;  Service: Cardiovascular;  Laterality: Right;  . CARDIAC CATHETERIZATION  Dec 18, 2003   with a presentation of atrial flutter with rapid ventricular response. He had  a cath and two stents to his PDA after his SVG to his RCA. His other graft were patent. He had LV with EF of 30%.   . CAROTID ENDARTERECTOMY  11/2002   left in December 2003 and known renal artery stenosis of 60-70%  . CORONARY ARTERY BYPASS GRAFT  1993   redo in 2000, he has had multiple MI previous to both procedures.  . IMPLANTABLE CARDIOVERTER DEFIBRILLATOR (ICD) GENERATOR CHANGE N/A 06/25/2012   Procedure: ICD GENERATOR CHANGE;  Surgeon: Marinus Maw, MD;  Location: [facility] CATH LAB;  Service: Cardiovascular;  Laterality: N/A;  . VALVE REPLACEMENT  2010    Current Medications: Outpatient Medications Prior to Visit  Medication Sig Dispense Refill  . aspirin EC 81 MG tablet Take 81 mg by mouth daily.     [name] atorvastatin (LIPITOR) 20 MG tablet TAKE 1 TABLET (20 MG TOTAL) BY MOUTH DAILY. 90 tablet 1  . carvedilol (COREG) 3.125 MG tablet TAKE 1 TABLET BY MOUTH TWICE A DAY 60 tablet 2  . ferrous sulfate (IRON SUPPLEMENT) 325 (65 FE) MG tablet Take 1 tablet (325 mg total) by mouth daily with breakfast. 180 tablet 3  . furosemide (LASIX) 40 MG tablet Take 1 tablet (40 mg total) by mouth daily. 30 tablet 0  . levothyroxine (SYNTHROID, LEVOTHROID) 50 MCG tablet Take 1 tablet (50 mcg total) by mouth daily before breakfast. 30 tablet 3  . nitroGLYCERIN (NITROSTAT) 0.4 MG SL tablet Place 1 tablet (0.4 mg) under the tongue every 5 minutes as needed for chest pain for up to 3 doses/call 9-1-1 and MD if no relief 25 tablet 0  . potassium chloride (K-DUR,KLOR-CON) 10 MEQ tablet Take 1 tablet (10 mEq total) by mouth daily. 30 tablet 5  . predniSONE (DELTASONE) 20 MG tablet Take 1 tablet (20 mg total) by mouth daily with breakfast. 3 tablet 0  . thiamine 100 MG tablet Take 1 tablet (100 mg total) by mouth daily. 30 tablet 0  . vitamin B-12 (CYANOCOBALAMIN) 1000 MCG tablet Take 1 tablet (1,000 mcg total) by mouth daily.     No facility-administered medications prior to visit.      Allergies:   Atorvastatin  and Other   Social History   Socioeconomic History  . Marital status: Married    Spouse name: Not on file  . Number of children: Not on file  . Years of education: Not on file  . Highest education level: Not on file  Occupational History  . Not on file  Social Needs  . Financial resource strain: Not on file  . Food insecurity:    Worry: Not on file    Inability: Not on file  . Transportation needs:    Medical: Not on file    Non-medical: Not on file  Tobacco Use  . Smoking status: Never Smoker  . Smokeless tobacco: Current User  Types: Chew  Substance and Sexual Activity  . Alcohol use: Yes    Alcohol/week: 14.0 standard drinks    Types: 14 Cans of beer per week  . Drug use: No  . Sexual activity: Not on file  Lifestyle  . Physical activity:    Days per week: Not on file    Minutes per session: Not on file  . Stress: Not on file  Relationships  . Social connections:    Talks on phone: Not on file    Gets together: Not on file    Attends religious service: Not on file    Active member of club or organization: Not on file    Attends meetings of clubs or organizations: Not on file    Relationship status: Not on file  Other Topics Concern  . Not on file  Social History Narrative  . Not on file     Family History:  The patient's family history includes CAD in his brother.   Review of Systems:   Please see the history of present illness.     General:  No chills, fever, night sweats or weight changes. Positive for fatigue (improved).  Cardiovascular:  No chest pain, dyspnea on exertion, edema, orthopnea, palpitations, paroxysmal nocturnal dyspnea. Dermatological: No rash, lesions/masses Respiratory: No cough, dyspnea Urologic: No hematuria, dysuria Abdominal:   No nausea, vomiting, diarrhea, bright red blood per rectum, melena, or hematemesis Neurologic:  No visual changes, wkns, changes in mental status. All other systems reviewed and are otherwise negative  except as noted above.   Physical Exam:    VS:  BP 132/78   Pulse 65   Ht 6' (1.829 m)   Wt 168 lb (76.2 kg)   SpO2 99%   BMI 22.78 kg/m    General: Well developed, well nourished Caucasian male appearing in no acute distress. Head: Normocephalic, atraumatic, sclera non-icteric, no xanthomas, nares are without discharge.  Neck: No carotid bruits. JVD not elevated.  Lungs: Respirations regular and unlabored, without wheezes or rales.  Heart: Regular rate and rhythm. No S3 or S4.  No rubs or gallops appreciated. 2/6 SEM along RUSB.  Abdomen: Soft, non-tender, non-distended with normoactive bowel sounds. No hepatomegaly. No rebound/guarding. No obvious abdominal masses. Msk:  Strength and tone appear normal for age. No joint deformities or effusions. Extremities: No clubbing or cyanosis. No lower extremity edema.  Distal pedal pulses are 2+ bilaterally. Neuro: Alert and oriented X 3. Moves all extremities spontaneously. No focal deficits noted. Psych:  Responds to questions appropriately with a normal affect. Skin: No rashes or lesions noted  Wt Readings from Last 3 Encounters:  08/17/18 168 lb (76.2 kg)  05/10/18 177 lb 9.6 oz (80.6 kg)  03/26/18 184 lb (83.5 kg)     Studies/Labs Reviewed:   EKG:  EKG is not ordered today.    Recent Labs: 01/28/2018: NT-Pro BNP 3,838 03/08/2018: B Natriuretic Peptide 1,000.1; Magnesium 2.3 05/10/2018: ALT 14; BUN 16; Creatinine, Ser 1.30; Hemoglobin 11.1; Platelets 281; Potassium 3.6; Sodium 139; TSH 27.890   Lipid Panel    Component Value Date/Time   CHOL 158 12/19/2016 0841   TRIG 74 12/19/2016 0841   HDL 38 (L) 12/19/2016 0841   CHOLHDL 4.2 12/19/2016 0841   VLDL 15 12/19/2016 0841   LDLCALC 105 (H) 12/19/2016 0841    Additional studies/ records that were reviewed today include:   Echocardiogram: 03/07/2018 Study Conclusions  - Left ventricle: The cavity size was mildly dilated. There was   mild  concentric hypertrophy. Systolic  function was moderately to   severely reduced. The estimated ejection fraction was in the   range of 30% to 35%. Diffuse hypokinesis worse in the   anterolateral, inferolateral, inferior, and apical myocardium.   Doppler parameters are consistent with a reversible restrictive   pattern, indicative of decreased left ventricular diastolic   compliance and/or increased left atrial pressure (grade 3   diastolic dysfunction). Doppler parameters are consistent with   high ventricular filling pressure. - Aortic valve: A bioprosthesis was present. Valve area (VTI): 2.02   cm^2. Valve area (Vmax): 2.02 cm^2. Valve area (Vmean): 1.95   cm^2. - Mitral valve: Transvalvular velocity was within the normal range.   There was no evidence for stenosis. There was trivial   regurgitation. - Left atrium: The atrium was severely dilated. - Right ventricle: The cavity size was normal. Wall thickness was   normal. Systolic function was mildly reduced. - Right atrium: The atrium was moderately dilated. - Atrial septum: No defect or patent foramen ovale was identified   by color flow Doppler. - Tricuspid valve: There was mild regurgitation. - Pulmonary arteries: Systolic pressure was increased. PA peak   pressure: 44 mm Hg (S).  Assessment:    1. Chronic combined systolic and diastolic heart failure (HCC)   2. Coronary artery disease involving native coronary artery of native heart without angina pectoris   3. Cardiac resynchronization therapy defibrillator (CRT-D) in place   4. H/O aortic valve replacement   5. Paroxysmal atrial fibrillation (HCC)   6. Hypothyroidism, unspecified type   7. Stage 3 chronic kidney disease (HCC)   8. Ascending aortic aneurysm (HCC)      Plan:   In order of problems listed above:  1. Chronic Combined Systolic and Diastolic CHF - He has a known reduced EF of 30 to 35% by echocardiogram in 02/2018. He has overall been doing very well from a volume perspective and denies  any recent dyspnea on exertion, orthopnea, PND, or lower extremity edema. His weight is at the lowest it has been over the past year by review of office notes. - Will continue on Lasix 40 mg daily and Carvedilol 3.125 mg twice daily. He was previously intolerant to ACE-I and ARB's which led to self-discontinuation of the medications.   2. CAD - s/p CABG in 1992 and redo CABG in 2004. Most recent catheterization in 2010 showed patent LIMA-LAD, free RIMA-OM, and SVG-OM, and 80% stenosis of small PLA branch. - He denies any recent chest pain or dyspnea on exertion. - Continue ASA, statin, and beta-blocker therapy.  3. Ischemic Cardiomyopathy/ CRT-D In Place - s/p Medtronic ICD placement in 06/2012. He is due for upcoming generator change out as outlined above. Risks and benefits of the procedure were reviewed with the patient and he agrees to proceed. This is scheduled with Dr. Royann Shivers on 09/08/2018. Instructions for the use of chlorhexidine scrub were reviewed with the patient as well and this was provided at the time of his visit.  4. Aortic Stenosis - s/p bioprosthetic AVR in 2010. Echocardiogram in 02/2018 showed normal function of the bioprosthesis.  5. Paroxysmal Atrial Fibrillation - He denies any recent palpitations and heart rate is well controlled in the 60's during today's visit. He has not been placed on anticoagulation due to the infrequency of his episodes and continued alcohol use.  6. Hypothyroidism - this has been followed by Dr. Royann Shivers as the patient has not yet established with a PCP. TSH was  previously elevated to 55 in 02/2018, improved to 27 in 04/2018. Will obtain a repeat TSH at the time of his repeat labs. He is currently on Synthroid 50 mcg daily. The importance of establishing with a PCP was again reviewed at today's visit.   7. Stage 3 CKD - creatinine improved to 1.30 when last checked on 05/10/2018. Will obtain a repeat BMET in preparation for his upcoming  procedure.  8. AAA - at 5.1 cm by imaging in 02/2018. Will continue to follow.  Medication Adjustments/Labs and Tests Ordered: Current medicines are reviewed at length with the patient today.  Concerns regarding medicines are outlined above.  Medication changes, Labs and Tests ordered today are listed in the Patient Instructions below. Patient Instructions  Medication Instructions:  Your physician recommends that you continue on your current medications as directed. Please refer to the Current Medication list given to you today.  Labwork: Pre-procedure labs  Testing/Procedures: NONE   Follow-Up: Your physician recommends that you schedule a follow-up appointment in: 8-10 Weeks with Dr. Royann Shivers    Any Other Special Instructions Will Be Listed Below (If Applicable).   If you need a refill on your cardiac medications before your next appointment, please call your pharmacy.  Thank you for choosing [facility]!    Signed, Ellsworth Lennox, PA-C  08/17/2018 5:40 PM    [facility] [address] [place], [place] [address] Phone: [phone]

## 2018-08-18 NOTE — Progress Notes (Signed)
You are too kind! Thanks, Old-C

## 2018-08-27 ENCOUNTER — Telehealth: Payer: Self-pay | Admitting: Cardiology

## 2018-08-27 ENCOUNTER — Other Ambulatory Visit: Payer: Self-pay | Admitting: Cardiology

## 2018-08-27 ENCOUNTER — Other Ambulatory Visit (HOSPITAL_COMMUNITY)
Admission: RE | Admit: 2018-08-27 | Discharge: 2018-08-27 | Disposition: A | Discharge status: Home / Self Care | Payer: Medicare Other | Source: Ambulatory Visit | Attending: Student | Admitting: Student

## 2018-08-27 ENCOUNTER — Telehealth: Payer: Self-pay

## 2018-08-27 DIAGNOSIS — E039 Hypothyroidism, unspecified: Secondary | ICD-10-CM | POA: Insufficient documentation

## 2018-08-27 DIAGNOSIS — N183 Chronic kidney disease, stage 3 (moderate): Secondary | ICD-10-CM | POA: Insufficient documentation

## 2018-08-27 DIAGNOSIS — I5042 Chronic combined systolic (congestive) and diastolic (congestive) heart failure: Secondary | ICD-10-CM | POA: Insufficient documentation

## 2018-08-27 DIAGNOSIS — I2510 Atherosclerotic heart disease of native coronary artery without angina pectoris: Secondary | ICD-10-CM | POA: Insufficient documentation

## 2018-08-27 DIAGNOSIS — E876 Hypokalemia: Secondary | ICD-10-CM

## 2018-08-27 LAB — BASIC METABOLIC PANEL
Anion gap: 11 (ref 5–15)
BUN: 14 mg/dL (ref 8–23)
CHLORIDE: 99 mmol/L (ref 98–111)
CO2: 29 mmol/L (ref 22–32)
Calcium: 9.1 mg/dL (ref 8.9–10.3)
Creatinine, Ser: 1.2 mg/dL (ref 0.61–1.24)
GFR calc Af Amer: >60 mL/min (ref >60)
GFR calc non Af Amer: 57 mL/min — ABNORMAL LOW (ref >60)
GLUCOSE: 88 mg/dL (ref 70–99)
Potassium: 2.9 mmol/L — ABNORMAL LOW (ref 3.5–5.1)
SODIUM: 139 mmol/L (ref 135–145)

## 2018-08-27 LAB — CBC WITH DIFFERENTIAL/PLATELET
Basophils Absolute: 0 10*3/uL (ref 0.0–0.1)
Basophils Relative: 0 %
EOS ABS: 0.2 10*3/uL (ref 0.0–0.7)
EOS PCT: 3 %
HCT: 37 % — ABNORMAL LOW (ref 39.0–52.0)
Hemoglobin: 12.1 g/dL — ABNORMAL LOW (ref 13.0–17.0)
LYMPHS ABS: 1.7 10*3/uL (ref 0.7–4.0)
LYMPHS PCT: 21 %
MCH: 29.8 pg (ref 26.0–34.0)
MCHC: 32.7 g/dL (ref 30.0–36.0)
MCV: 91.1 fL (ref 78.0–100.0)
MONOS PCT: 13 %
Monocytes Absolute: 1.1 10*3/uL — ABNORMAL HIGH (ref 0.1–1.0)
Neutro Abs: 5.3 10*3/uL (ref 1.7–7.7)
Neutrophils Relative %: 63 %
Platelets: 242 10*3/uL (ref 150–400)
RBC: 4.06 MIL/uL — ABNORMAL LOW (ref 4.22–5.81)
RDW: 16 % — ABNORMAL HIGH (ref 11.5–15.5)
WBC: 8.3 10*3/uL (ref 4.0–10.5)

## 2018-08-27 LAB — PROTIME-INR
INR: 1.02
Prothrombin Time: 13.3 seconds (ref 11.4–15.2)

## 2018-08-27 LAB — TSH: TSH: 18.262 u[IU]/mL — ABNORMAL HIGH (ref 0.350–4.500)

## 2018-08-27 MED ORDER — POTASSIUM CHLORIDE CRYS ER 10 MEQ PO TBCR
40.0000 meq | EXTENDED_RELEASE_TABLET | Freq: Every day | ORAL | 5 refills | Status: DC
Start: 2018-08-27 — End: 2018-12-10

## 2018-08-27 NOTE — Telephone Encounter (Signed)
Received outpatient page from patient.  Discussed potassium plan.  He will go and pick up his new prescription tonight and take 40 mEq as soon as he gets the bottle.  He will then take 40 mEq prior to going to bed.  Then he will take 40 mEq daily from here on out.  He is to have a BMET either Thursday or Friday of next week to evaluate his levels.  He understands the plan and verbalized back to me.  Georgie Chard NP-C [facility] Pager: [phone]

## 2018-08-27 NOTE — Telephone Encounter (Signed)
Received incoming page from patient's wife Lanora Manis, who states they received a message from the office stating that the patient's potassium was low.  Patient was seen by Emilia Beck, NP in the office on 08/17/2018.  BMET from today 08/27/2018 revealed potassium of 2.9.  Called patient and spoke with patient's wife however, she is unclear as to what medications he is on and the patient is not available for discussion.  I have instructed her that he will need to take 40 mEq daily of potassium with an extra 40 mEq now and an extra 40 mEq in approximately 4 hours.  He will need his lab drawn once again in approximately 1 week at the office.  I have placed that order.  I discussed the plan with the patient's wife however she seems to not understand.  She is going to get her husband and call me back for further instruction clarification.  I have sent the increase of potassium daily to their pharmacy.  Georgie Chard NP-C [facility] Pager: [phone]

## 2018-08-27 NOTE — Telephone Encounter (Signed)
-----   Message from Dayna N Dunn, PA-C sent at 08/27/2018  4:45 PM EDT ----- I am covering Brittany'x box today. Note reviewed. Please call patient. Potassium level is low and requires the most acute attention. Please confirm compliance. Please increase to 40meq daily. Take 40meq ASAP and 40meq 4 hours later, then start taking 40meq daily. Recheck BMET in 1 week with magnesium level. The labs that are stable are mild anemia, kidney function. TSH has improved but is still indicating low thyroid function therefore needs to f/u with PCP for this. Doesn't look like he has one yet - needs to get one. Please confirm compliance and increase levothyroxine to 75mcg daily with recheck TSH/free T4 in 4 weeks.  Dayna Dunn PA-C  

## 2018-08-27 NOTE — Telephone Encounter (Signed)
LMTCB-cc Told pt to call answering service so that can direct him

## 2018-08-30 ENCOUNTER — Telehealth: Payer: Self-pay | Admitting: *Deleted

## 2018-08-30 NOTE — Telephone Encounter (Signed)
Called patient with test results. No answer. Left message to call back.  

## 2018-08-30 NOTE — Telephone Encounter (Signed)
-----   Message from Laurann Montana, PA-C sent at 08/27/2018  4:45 PM EDT ----- I am covering Brittany'x box today. Note reviewed. Please call patient. Potassium level is low and requires the most acute attention. Please confirm compliance. Please increase to daily. Take ASAP and 4 hours later, then start taking daily. Recheck BMET in 1 week with magnesium level. The labs that are stable are mild anemia, kidney function. TSH has improved but is still indicating low thyroid function therefore needs to f/u with PCP for this. Doesn't look like he has one yet - needs to get one. Please confirm compliance and increase levothyroxine to daily with recheck TSH/free T4 in 4 weeks.  Dayna Dunn PA-C

## 2018-08-30 NOTE — Telephone Encounter (Signed)
Called pt. No answer, no voicemail. Will call again tomorrow.

## 2018-08-30 NOTE — Telephone Encounter (Signed)
-----   Message from Dayna N Dunn, PA-C sent at 08/27/2018  4:45 PM EDT ----- I am covering Brittany'x box today. Note reviewed. Please call patient. Potassium level is low and requires the most acute attention. Please confirm compliance. Please increase to 40meq daily. Take 40meq ASAP and 40meq 4 hours later, then start taking 40meq daily. Recheck BMET in 1 week with magnesium level. The labs that are stable are mild anemia, kidney function. TSH has improved but is still indicating low thyroid function therefore needs to f/u with PCP for this. Doesn't look like he has one yet - needs to get one. Please confirm compliance and increase levothyroxine to 75mcg daily with recheck TSH/free T4 in 4 weeks.  Dayna Dunn PA-C  

## 2018-08-31 NOTE — Telephone Encounter (Signed)
-----   Message from Dayna N Dunn, PA-C sent at 08/27/2018  4:45 PM EDT ----- I am covering Brittany'x box today. Note reviewed. Please call patient. Potassium level is low and requires the most acute attention. Please confirm compliance. Please increase to 40meq daily. Take 40meq ASAP and 40meq 4 hours later, then start taking 40meq daily. Recheck BMET in 1 week with magnesium level. The labs that are stable are mild anemia, kidney function. TSH has improved but is still indicating low thyroid function therefore needs to f/u with PCP for this. Doesn't look like he has one yet - needs to get one. Please confirm compliance and increase levothyroxine to 75mcg daily with recheck TSH/free T4 in 4 weeks.  Dayna Dunn PA-C  

## 2018-08-31 NOTE — Telephone Encounter (Signed)
Pt returned call. He understood all lab results. He was confused as to when his appointments were. I let him know all the appointment dates & times. He stated that someone from another office called him this morning stating he has an appointment this week with Dr. Salena Saner. I advised him to call [facility] office as I did not see such an appointment scheduled.

## 2018-08-31 NOTE — Telephone Encounter (Signed)
Called pt, no answer. Left message for a return call.

## 2018-09-08 ENCOUNTER — Ambulatory Visit (HOSPITAL_COMMUNITY)
Admission: RE | Admit: 2018-09-08 | Discharge: 2018-09-08 | Disposition: A | Discharge status: Home / Self Care | Payer: Medicare Other | Source: Ambulatory Visit | Attending: Cardiovascular Disease | Admitting: Cardiovascular Disease

## 2018-09-08 ENCOUNTER — Ambulatory Visit (HOSPITAL_COMMUNITY)
Admission: RE | Disposition: A | Discharge status: Home / Self Care | Payer: Self-pay | Source: Ambulatory Visit | Attending: Cardiovascular Disease

## 2018-09-08 DIAGNOSIS — I447 Left bundle-branch block, unspecified: Secondary | ICD-10-CM | POA: Diagnosis not present

## 2018-09-08 DIAGNOSIS — E785 Hyperlipidemia, unspecified: Secondary | ICD-10-CM | POA: Diagnosis not present

## 2018-09-08 DIAGNOSIS — I252 Old myocardial infarction: Secondary | ICD-10-CM | POA: Diagnosis not present

## 2018-09-08 DIAGNOSIS — K219 Gastro-esophageal reflux disease without esophagitis: Secondary | ICD-10-CM | POA: Diagnosis not present

## 2018-09-08 DIAGNOSIS — E039 Hypothyroidism, unspecified: Secondary | ICD-10-CM | POA: Diagnosis not present

## 2018-09-08 DIAGNOSIS — Z7982 Long term (current) use of aspirin: Secondary | ICD-10-CM | POA: Insufficient documentation

## 2018-09-08 DIAGNOSIS — N183 Chronic kidney disease, stage 3 (moderate): Secondary | ICD-10-CM | POA: Diagnosis not present

## 2018-09-08 DIAGNOSIS — M109 Gout, unspecified: Secondary | ICD-10-CM | POA: Insufficient documentation

## 2018-09-08 DIAGNOSIS — I480 Paroxysmal atrial fibrillation: Secondary | ICD-10-CM | POA: Insufficient documentation

## 2018-09-08 DIAGNOSIS — I2510 Atherosclerotic heart disease of native coronary artery without angina pectoris: Secondary | ICD-10-CM | POA: Diagnosis not present

## 2018-09-08 DIAGNOSIS — Z79890 Hormone replacement therapy (postmenopausal): Secondary | ICD-10-CM | POA: Insufficient documentation

## 2018-09-08 DIAGNOSIS — I130 Hypertensive heart and chronic kidney disease with heart failure and stage 1 through stage 4 chronic kidney disease, or unspecified chronic kidney disease: Secondary | ICD-10-CM | POA: Diagnosis not present

## 2018-09-08 DIAGNOSIS — I712 Thoracic aortic aneurysm, without rupture: Secondary | ICD-10-CM | POA: Diagnosis not present

## 2018-09-08 DIAGNOSIS — Z953 Presence of xenogenic heart valve: Secondary | ICD-10-CM | POA: Diagnosis not present

## 2018-09-08 DIAGNOSIS — M1990 Unspecified osteoarthritis, unspecified site: Secondary | ICD-10-CM | POA: Diagnosis not present

## 2018-09-08 DIAGNOSIS — I350 Nonrheumatic aortic (valve) stenosis: Secondary | ICD-10-CM | POA: Insufficient documentation

## 2018-09-08 DIAGNOSIS — Z006 Encounter for examination for normal comparison and control in clinical research program: Secondary | ICD-10-CM | POA: Insufficient documentation

## 2018-09-08 DIAGNOSIS — I5042 Chronic combined systolic (congestive) and diastolic (congestive) heart failure: Secondary | ICD-10-CM | POA: Diagnosis present

## 2018-09-08 DIAGNOSIS — Z79899 Other long term (current) drug therapy: Secondary | ICD-10-CM | POA: Diagnosis not present

## 2018-09-08 DIAGNOSIS — Z4502 Encounter for adjustment and management of automatic implantable cardiac defibrillator: Secondary | ICD-10-CM | POA: Diagnosis not present

## 2018-09-08 DIAGNOSIS — Z951 Presence of aortocoronary bypass graft: Secondary | ICD-10-CM | POA: Diagnosis not present

## 2018-09-08 DIAGNOSIS — Z8249 Family history of ischemic heart disease and other diseases of the circulatory system: Secondary | ICD-10-CM | POA: Insufficient documentation

## 2018-09-08 DIAGNOSIS — Z888 Allergy status to other drugs, medicaments and biological substances status: Secondary | ICD-10-CM | POA: Diagnosis not present

## 2018-09-08 DIAGNOSIS — I255 Ischemic cardiomyopathy: Secondary | ICD-10-CM | POA: Diagnosis not present

## 2018-09-08 HISTORY — PX: BIV ICD GENERATOR CHANGEOUT: EP1194

## 2018-09-08 LAB — BASIC METABOLIC PANEL
ANION GAP: 13 (ref 5–15)
BUN: 20 mg/dL (ref 8–23)
CALCIUM: 9.1 mg/dL (ref 8.9–10.3)
CO2: 27 mmol/L (ref 22–32)
Chloride: 99 mmol/L (ref 98–111)
Creatinine, Ser: 1.39 mg/dL — ABNORMAL HIGH (ref 0.61–1.24)
GFR calc Af Amer: 56 mL/min — ABNORMAL LOW (ref >60)
GFR, EST NON AFRICAN AMERICAN: 48 mL/min — AB (ref >60)
Glucose, Bld: 77 mg/dL (ref 70–99)
POTASSIUM: 3.4 mmol/L — AB (ref 3.5–5.1)
SODIUM: 139 mmol/L (ref 135–145)

## 2018-09-08 LAB — SURGICAL PCR SCREEN
MRSA, PCR: NEGATIVE
STAPHYLOCOCCUS AUREUS: NEGATIVE

## 2018-09-08 SURGERY — BIV ICD GENERATOR CHANGEOUT

## 2018-09-08 MED ORDER — ACETAMINOPHEN 325 MG PO TABS
325.0000 mg | ORAL_TABLET | ORAL | Status: DC | PRN
  Filled 2018-09-08: qty 2

## 2018-09-08 MED ORDER — MIDAZOLAM HCL 5 MG/5ML IJ SOLN
INTRAMUSCULAR | Status: AC
  Filled 2018-09-08: qty 5

## 2018-09-08 MED ORDER — CEFAZOLIN SODIUM-DEXTROSE 2-4 GM/100ML-% IV SOLN
INTRAVENOUS | Status: AC
  Filled 2018-09-08: qty 100

## 2018-09-08 MED ORDER — CEFAZOLIN SODIUM-DEXTROSE 2-4 GM/100ML-% IV SOLN
2.0000 g | INTRAVENOUS | Status: AC
  Administered 2018-09-08: 2 g via INTRAVENOUS
  Filled 2018-09-08: qty 100

## 2018-09-08 MED ORDER — ONDANSETRON HCL 4 MG/2ML IJ SOLN: 4.0000 mg | Freq: Four times a day (QID) | INTRAMUSCULAR | Status: DC | PRN

## 2018-09-08 MED ORDER — SODIUM CHLORIDE 0.9 % IV SOLN
INTRAVENOUS | Status: DC
  Administered 2018-09-08: 14:00:00 via INTRAVENOUS

## 2018-09-08 MED ORDER — SODIUM CHLORIDE 0.9 % IV SOLN
INTRAVENOUS | Status: AC
  Filled 2018-09-08: qty 2

## 2018-09-08 MED ORDER — FENTANYL CITRATE (PF) 100 MCG/2ML IJ SOLN
INTRAMUSCULAR | Status: AC
  Filled 2018-09-08: qty 2

## 2018-09-08 MED ORDER — MUPIROCIN 2 % EX OINT
TOPICAL_OINTMENT | CUTANEOUS | Status: AC
  Administered 2018-09-08: 14:00:00
  Filled 2018-09-08: qty 22

## 2018-09-08 MED ORDER — CHLORHEXIDINE GLUCONATE 4 % EX LIQD
60.0000 mL | Freq: Once | CUTANEOUS | Status: DC
  Filled 2018-09-08: qty 60

## 2018-09-08 MED ORDER — SODIUM CHLORIDE 0.9 % IV SOLN
80.0000 mg | INTRAVENOUS | Status: AC
  Administered 2018-09-08: 80 mg
  Filled 2018-09-08: qty 2

## 2018-09-08 MED ORDER — LIDOCAINE HCL (PF) 1 % IJ SOLN
INTRAMUSCULAR | Status: DC | PRN
  Administered 2018-09-08: 40 mL

## 2018-09-08 MED ORDER — LIDOCAINE HCL (PF) 1 % IJ SOLN
INTRAMUSCULAR | Status: AC
  Filled 2018-09-08: qty 60

## 2018-09-08 SURGICAL SUPPLY — 4 items
CABLE SURGICAL S-101-97-12 (CABLE) ×3 IMPLANT
ICD CLARIA MRI DTMA1D1 (ICD Generator) ×2 IMPLANT
PAD PRO RADIOLUCENT 2001M-C (PAD) ×3 IMPLANT
TRAY PACEMAKER INSERTION (PACKS) ×3 IMPLANT

## 2018-09-08 NOTE — Op Note (Signed)
Procedure report  Procedure performed:  Dual chamber biventricular ICD generator changeout    Reason for procedure:  1. Device generator at elective replacement interval  2. Chronic combined systolic and diastolic heart failure; LBBB Procedure performed by:  Thurmon FairMihai Imri Lor, MD  Complications:  None  Estimated blood loss:  <5 mL  Medications administered during procedure:  Ancef 2 g intravenously, lidocaine 1% 30 mL locally Device details:  New Generator Medtronic Claria MRI model number [id number], serial number [id number] Right atrial lead (chronic) Medtronic [id number], serial [id number] (implanted 08/18/2012) Right ventricular lead (chronic)  Medtronic , model number [id number], serial number [id number] (implanted 08/18/2012) Left ventricular lead  Medtronic , model number [id number] , serial number [id number] (implanted 08/18/2012)  Explanted generator Medtronic Protecta XT,  model number [id number], serial number  [id number] (implanted 08/18/2012)  Procedure details:  After the risks and benefits of the procedure were discussed the patient provided informed consent. She was brought to the cardiac catheter lab in the fasting state. The patient was prepped and draped in usual sterile fashion. Local anesthesia with 1% lidocaine was administered to to the right infraclavicular area. A 5-6cm horizontal incision was made parallel with and 2-3 cm caudal to the right clavicle, in the area of an old scar. An older scar was seen closer to the right clavicle. Using minimal electrocautery and mostly sharp and blunt dissection the prepectoral pocket was opened carefully to avoid injury to the loops of chronic leads. Extensive dissection was not necessary. The device was explanted. The pocket was carefully inspected for hemostasis and flushed with copious amounts of antibiotic solution.  The leads were disconnected from the old generator and testing of the lead parameters later showed excellent values. The  new generator was connected to the chronic leads, with appropriate pacing noted.   The entire system was then carefully inserted in the pocket with care been taking that the leads and device assumed a comfortable position without pressure on the incision. Great care was taken that the leads be located deep to the generator. The pocket was then closed in layers using 2 layers of 2-0 Vicryl and cutaneous staples after which a sterile dressing was applied.   At the end of the procedure the following lead parameters were encountered:   Right atrial lead sensed P waves 1.9 mV, impedance 488 ohms, threshold 0.4V at 0.5 ms pulse width.  Right ventricular lead sensed R waves  3.0-5.0 mV, impedance 351 ohms, threshold 0.8V at 0.5 ms pulse width.  Left ventricular lead sensed R waves  >30 mV, impedance 910 ohms, threshold 0.8V at 0.5 ms pulse width.  Thurmon FairMihai Dayanara Sherrill, MD, [facility]ton[facility]ACC [facility] [phone] office [phone] pager

## 2018-09-08 NOTE — Interval H&P Note (Signed)
History and Physical Interval Note:  09/08/2018 1:48 PM  [name]  has presented today for surgery, with the diagnosis of ERI  The various methods of treatment have been discussed with the patient and family. After consideration of risks, benefits and other options for treatment, the patient has consented to  Procedure(s): BIV ICD GENERATOR CHANGEOUT (N/A) as a surgical intervention .  The patient's history has been reviewed, patient examined, no change in status, stable for surgery.  I have reviewed the patient's chart and labs.  Questions were answered to the patient's satisfaction.     Fred Hammes

## 2018-09-08 NOTE — Discharge Instructions (Signed)

## 2018-09-09 ENCOUNTER — Encounter (HOSPITAL_COMMUNITY): Payer: Self-pay | Admitting: Cardiovascular Disease

## 2018-09-09 MED FILL — Midazolam HCl Inj 5 MG/5ML (Base Equivalent): INTRAMUSCULAR | Qty: 5 | Status: AC

## 2018-09-09 MED FILL — Fentanyl Citrate Preservative Free (PF) Inj 100 MCG/2ML: INTRAMUSCULAR | Qty: 2 | Status: AC

## 2018-09-10 NOTE — Progress Notes (Signed)
ICD Criteria  Current LVEF:30-35%. Within 12 months prior to implant: Yes   Heart failure history: Yes, Class II  Cardiomyopathy history: Yes, Ischemic Cardiomyopathy - Prior MI.  Atrial Fibrillation/Atrial Flutter: No.  Ventricular tachycardia history: No.  Cardiac arrest history: No.  History of syndromes with risk of sudden death: No.  Previous ICD: Yes, Reason for ICD:  Primary prevention.  Current ICD indication: Primary  PPM indication: No.   Class I or II Bradycardia indication present: Yes  Beta Blocker therapy for 3 or more months: Yes, prescribed.   Ace Inhibitor/ARB therapy for 3 or more months: No, medical reason. Hypotension

## 2018-09-13 ENCOUNTER — Ambulatory Visit: Payer: Medicare Other | Admitting: Cardiovascular Disease

## 2018-09-22 ENCOUNTER — Ambulatory Visit (INDEPENDENT_AMBULATORY_CARE_PROVIDER_SITE_OTHER): Payer: Medicare Other | Admitting: *Deleted

## 2018-09-22 DIAGNOSIS — I255 Ischemic cardiomyopathy: Secondary | ICD-10-CM | POA: Diagnosis not present

## 2018-09-22 LAB — CUP PACEART INCLINIC DEVICE CHECK
Brady Statistic AP VP Percent: 16.71 %
Brady Statistic AS VS Percent: 1.29 %
Brady Statistic RA Percent Paced: 16.67 %
Brady Statistic RV Percent Paced: 98.34 %
Date Time Interrogation Session: 20191002092347
HighPow Impedance: 65 Ohm
Implantable Lead Implant Date: 20130828
Implantable Lead Implant Date: 20130828
Implantable Lead Location: 753858
Implantable Lead Location: 753860
Implantable Lead Model: 5076
Implantable Lead Model: 6935
Lead Channel Impedance Value: 285 Ohm
Lead Channel Impedance Value: 342 Ohm
Lead Channel Impedance Value: 665 Ohm
Lead Channel Pacing Threshold Amplitude: 1 V
Lead Channel Pacing Threshold Pulse Width: 0.4 ms
Lead Channel Sensing Intrinsic Amplitude: 1.375 mV
Lead Channel Setting Pacing Amplitude: 2.5 V
Lead Channel Setting Pacing Pulse Width: 0.4 ms
MDC IDC LEAD IMPLANT DT: 20130828
MDC IDC LEAD LOCATION: 753859
MDC IDC MSMT BATTERY REMAINING LONGEVITY: 86 mo
MDC IDC MSMT BATTERY VOLTAGE: 3.02 V
MDC IDC MSMT LEADCHNL LV IMPEDANCE VALUE: 513 Ohm
MDC IDC MSMT LEADCHNL LV PACING THRESHOLD PULSEWIDTH: 0.4 ms
MDC IDC MSMT LEADCHNL RA IMPEDANCE VALUE: 418 Ohm
MDC IDC MSMT LEADCHNL RA PACING THRESHOLD AMPLITUDE: 0.5 V
MDC IDC MSMT LEADCHNL RA SENSING INTR AMPL: 1.375 mV
MDC IDC MSMT LEADCHNL RV IMPEDANCE VALUE: 304 Ohm
MDC IDC MSMT LEADCHNL RV PACING THRESHOLD AMPLITUDE: 0.75 V
MDC IDC MSMT LEADCHNL RV PACING THRESHOLD PULSEWIDTH: 0.4 ms
MDC IDC MSMT LEADCHNL RV SENSING INTR AMPL: 6.5 mV
MDC IDC MSMT LEADCHNL RV SENSING INTR AMPL: 8.25 mV
MDC IDC PG IMPLANT DT: 20190918
MDC IDC SET LEADCHNL LV PACING AMPLITUDE: 2.25 V
MDC IDC SET LEADCHNL RA PACING AMPLITUDE: 1.5 V
MDC IDC SET LEADCHNL RV PACING PULSEWIDTH: 0.4 ms
MDC IDC SET LEADCHNL RV SENSING SENSITIVITY: 0.3 mV
MDC IDC STAT BRADY AP VS PERCENT: 0.07 %
MDC IDC STAT BRADY AS VP PERCENT: 81.92 %

## 2018-09-22 NOTE — Progress Notes (Signed)
Wound check appointment. Staples removed. Wound without redness or edema. Incision edges approximated, wound well healed. Normal device function. Thresholds, sensing, and impedances consistent with implant measurements. Bi-Vp 98.3%. Device programmed at chronic output values. Histogram distribution appropriate for patient and level of activity. 1 AF episode 4hrs not a candidate for OAC d/t GI bleed. No ventricular arrhythmias noted. Patient educated about wound care, arm mobility, lifting restrictions, shock plan. ROV 12/21/2018 w/ [facility]

## 2018-11-02 ENCOUNTER — Other Ambulatory Visit: Payer: Self-pay | Admitting: Cardiovascular Disease

## 2018-11-23 ENCOUNTER — Ambulatory Visit: Payer: Medicare Other | Admitting: Cardiovascular Disease

## 2018-12-10 ENCOUNTER — Other Ambulatory Visit: Payer: Self-pay | Admitting: Cardiovascular Disease

## 2018-12-21 ENCOUNTER — Ambulatory Visit (INDEPENDENT_AMBULATORY_CARE_PROVIDER_SITE_OTHER): Payer: Medicare Other | Admitting: Cardiovascular Disease

## 2018-12-21 ENCOUNTER — Encounter: Payer: Self-pay | Admitting: Cardiovascular Disease

## 2018-12-21 ENCOUNTER — Encounter (INDEPENDENT_AMBULATORY_CARE_PROVIDER_SITE_OTHER): Payer: Self-pay

## 2018-12-21 VITALS — BP 158/62 | HR 76 | Ht 73.0 in | Wt 182.0 lb

## 2018-12-21 DIAGNOSIS — Z95810 Presence of automatic (implantable) cardiac defibrillator: Secondary | ICD-10-CM | POA: Diagnosis not present

## 2018-12-21 DIAGNOSIS — N183 Chronic kidney disease, stage 3 unspecified: Secondary | ICD-10-CM

## 2018-12-21 DIAGNOSIS — E7800 Pure hypercholesterolemia, unspecified: Secondary | ICD-10-CM | POA: Diagnosis not present

## 2018-12-21 DIAGNOSIS — I7121 Aneurysm of the ascending aorta, without rupture: Secondary | ICD-10-CM

## 2018-12-21 DIAGNOSIS — Z09 Encounter for follow-up examination after completed treatment for conditions other than malignant neoplasm: Secondary | ICD-10-CM

## 2018-12-21 DIAGNOSIS — M1009 Idiopathic gout, multiple sites: Secondary | ICD-10-CM

## 2018-12-21 DIAGNOSIS — I25810 Atherosclerosis of coronary artery bypass graft(s) without angina pectoris: Secondary | ICD-10-CM | POA: Diagnosis not present

## 2018-12-21 DIAGNOSIS — I5042 Chronic combined systolic (congestive) and diastolic (congestive) heart failure: Secondary | ICD-10-CM | POA: Diagnosis not present

## 2018-12-21 DIAGNOSIS — I4819 Other persistent atrial fibrillation: Secondary | ICD-10-CM

## 2018-12-21 DIAGNOSIS — D508 Other iron deficiency anemias: Secondary | ICD-10-CM

## 2018-12-21 DIAGNOSIS — I6523 Occlusion and stenosis of bilateral carotid arteries: Secondary | ICD-10-CM

## 2018-12-21 DIAGNOSIS — Z953 Presence of xenogenic heart valve: Secondary | ICD-10-CM

## 2018-12-21 DIAGNOSIS — I712 Thoracic aortic aneurysm, without rupture: Secondary | ICD-10-CM

## 2018-12-21 DIAGNOSIS — E032 Hypothyroidism due to medicaments and other exogenous substances: Secondary | ICD-10-CM

## 2018-12-21 DIAGNOSIS — I10 Essential (primary) hypertension: Secondary | ICD-10-CM

## 2018-12-21 LAB — COMPREHENSIVE METABOLIC PANEL
A/G RATIO: 1.5 (ref 1.2–2.2)
ALK PHOS: 84 IU/L (ref 39–117)
ALT: 7 IU/L (ref 0–44)
AST: 14 IU/L (ref 0–40)
Albumin: 4.1 g/dL (ref 3.5–4.8)
BUN/Creatinine Ratio: 20 (ref 10–24)
BUN: 23 mg/dL (ref 8–27)
Bilirubin Total: 0.7 mg/dL (ref 0.0–1.2)
CO2: 20 mmol/L (ref 20–29)
CREATININE: 1.15 mg/dL (ref 0.76–1.27)
Calcium: 9.1 mg/dL (ref 8.6–10.2)
Chloride: 100 mmol/L (ref 96–106)
GFR calc Af Amer: 72 mL/min/{1.73_m2} (ref >59)
GFR calc non Af Amer: 62 mL/min/{1.73_m2} (ref >59)
GLOBULIN, TOTAL: 2.7 g/dL (ref 1.5–4.5)
Glucose: 85 mg/dL (ref 65–99)
POTASSIUM: 3 mmol/L — AB (ref 3.5–5.2)
SODIUM: 140 mmol/L (ref 134–144)
Total Protein: 6.8 g/dL (ref 6.0–8.5)

## 2018-12-21 LAB — MAGNESIUM: MAGNESIUM: 1.5 mg/dL — AB (ref 1.6–2.3)

## 2018-12-21 LAB — CBC
Hematocrit: 34.7 % — ABNORMAL LOW (ref 37.5–51.0)
Hemoglobin: 11.7 g/dL — ABNORMAL LOW (ref 13.0–17.7)
MCH: 31.5 pg (ref 26.6–33.0)
MCHC: 33.7 g/dL (ref 31.5–35.7)
MCV: 93 fL (ref 79–97)
PLATELETS: 186 10*3/uL (ref 150–450)
RBC: 3.72 x10E6/uL — ABNORMAL LOW (ref 4.14–5.80)
RDW: 12.5 % (ref 12.3–15.4)
WBC: 5.6 10*3/uL (ref 3.4–10.8)

## 2018-12-21 MED ORDER — PREDNISONE 20 MG PO TABS: 20.0000 mg | ORAL_TABLET | Freq: Every day | ORAL | 0 refills | Status: AC

## 2018-12-21 NOTE — Progress Notes (Signed)
Cardiology Office Note    Date:  12/22/2018   ID:  [name], DOB [birth year], MRN [id number]  PCP:  Patient, No Pcp Per  Cardiologist:   Thurmon FairMihai April Carlyon, MD   Chief Complaint  Patient presents with  . Dyspnea with mild exertion    History of Present Illness:  [name] "[name]" is a 77 y.o. male with a long-standing history of CAD with redo CABG, aortic valve replacement with bioprosthesis, ischemic cardiomyopathy, CRT-D, systolic heart failure, bilateral carotid stenoses and endarterectomy, returning for follow-up.  [name] is not feeling too great.  He has had diarrhea for the last 2-3 days.  His wife had it before him and is taking her a week to recover.  He feels weak, but has not had dizziness or syncope.  He denies shortness of breath, angina or palpitations.  He has been eating and does not have nausea and vomiting.  During the same time he has had a gout attack involving the big toe on the right foot and the fifth toe on the left foot, already resolving in his left thumb.  His weight has gone up a bit, 5 pounds since I last saw him, but he thinks he is gained some true weight due to inactivity.  Does not have edema.  Denies orthopnea or PND.  Has not had syncope or defibrillator discharges.  He was last hospitalized for heart failure in March 2019 in the setting of moderate to severe iron deficiency anemia, acute on chronic renal insufficiency and severe hypothyroidism, which led to discontinuation of amiodarone.  EF at that time was 30-35%.  Aortic valve prosthetic function was normal.  The ascending aorta was measured at 4.6 cm, previously documented at 5.1 cm on CT of the chest.   He continues to drink alcohol in larger than recommended amounts.   He has a Sales promotion account executiveMedtronic ClariaCRT-D, generator change out September 2019, initial device implanted in 2013.  Device function is normal.  All 3 leads have excellent sensing and pacing and impedance parameters. Activity is documented  at roughly 6 hours/day which is typical for him.  He has been in uninterrupted atrial fibrillation since early November (overall burden 67%).  During atrial fibrillation biventricular pacing is suboptimal, on average 85%.  Before stopping his amiodarone he had not had atrial fibrillation since an episode lasting a few hours in November 2018 and before that had not had any atrial fibrillation since 2015.  He is not on anticoagulation because of his excessive use of alcohol and had iron deficiency anemia even without being anticoagulated.  Thoracic impedance does not show evidence of fluid overload.  There has been no ventricular tachycardia.   Past Medical History:  Diagnosis Date  . Anginal pain (HCC)   . Anxiety   . Aortic stenosis    a. s/p bioprosthetic AVR in 2010  . Arthritis    gout  . CAD (coronary artery disease)    a. s/p CABG in 1992 b. redo CABG in 2004 c. cath in 2010 showing patent LIMA-LAD, free RIMA-OM, and SVG-OM, and 80% stenosis of small PLA branch  . Chest pain   . CHF (congestive heart failure) (HCC)   . Dysrhythmia   . GERD (gastroesophageal reflux disease)    with gas and bloating probably cause of chest pain  . H/O atrial flutter    a. s/p ablation and not on anticoagulation secondary to GIB and alcohol abuse.   . H/O: gout   . Hyperlipidemia   .  Hypertension   . ICD (implantable cardiac defibrillator) infection, s/p removal of ICD 07/12/2012   re-implanted Medtronic BiV ICD, serial number [id number]   . Ischemic cardiomyopathy    a. EF previously 20-25%, improved to 40-45% by echo in 03/2013 b. 10/2017: echo showing of 30-35% with Grade 2 DD, normal function of AVR, moderate MR.   . Myocardial infarction (HCC)   . 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 Shortness of breath     Past Surgical History:  Procedure Laterality Date  . BI-VENTRICULAR IMPLANTABLE CARDIOVERTER DEFIBRILLATOR Right 08/18/2012   Medtronic BiV ICD,  serial number [id number] ; Procedure: BI-VENTRICULAR IMPLANTABLE CARDIOVERTER DEFIBRILLATOR  (CRT-D);  Surgeon: Marinus Maw, MD;  Location[facility] CATH LAB;  Service: Cardiovascular;  Laterality: Right;  . BIV ICD GENERATOR CHANGEOUT N/A 09/08/2018   Procedure: BIV ICD GENERATOR CHANGEOUT;  Surgeon: Thurmon Fair, MD;  Location: [facility] INVASIVE CV LAB;  Service: Cardiovascular;  Laterality: N/A;  . CARDIAC CATHETERIZATION  Dec 18, 2003   with a presentation of atrial flutter with rapid ventricular response. He had a cath and two stents to his PDA after his SVG to his RCA. His other graft were patent. He had LV with EF of 30%.   . CAROTID ENDARTERECTOMY  11/2002   left in December 2003 and known renal artery stenosis of 60-70%  . CORONARY ARTERY BYPASS GRAFT  1993   redo in 2000, he has had multiple MI previous to both procedures.  . IMPLANTABLE CARDIOVERTER DEFIBRILLATOR (ICD) GENERATOR CHANGE N/A 06/25/2012   Procedure: ICD GENERATOR CHANGE;  Surgeon: Marinus Maw, MD;  Location[facility] CATH LAB;  Service: Cardiovascular;  Laterality: N/A;  . VALVE REPLACEMENT  2010    Current Medications: Outpatient Medications Prior to Visit  Medication Sig Dispense Refill  . aspirin EC 81 MG tablet Take 81 mg by mouth daily.     [name] atorvastatin (LIPITOR) 20 MG tablet TAKE 1 TABLET (20 MG TOTAL) BY MOUTH DAILY. 90 tablet 1  . carvedilol (COREG) 3.125 MG tablet TAKE 1 TABLET BY MOUTH TWICE A DAY 60 tablet 2  . ferrous sulfate (IRON SUPPLEMENT) 325 (65 FE) MG tablet Take 1 tablet (325 mg total) by mouth daily with breakfast. 180 tablet 3  . furosemide (LASIX) 40 MG tablet Take 1 tablet (40 mg total) by mouth daily. (Patient taking differently: Take 40 mg by mouth 2 (two) times daily. ) 30 tablet 0  . KLOR-CON M10 10 MEQ tablet TAKE 1 TABLET BY MOUTH EVERY DAY 30 tablet 5  . nitroGLYCERIN (NITROSTAT) 0.4 MG SL tablet Place 1 tablet (0.4 mg) under the tongue every 5 minutes as needed for chest pain for up to 3  doses/call 9-1-1 and MD if no relief 25 tablet 0  . vitamin B-12 (CYANOCOBALAMIN) 1000 MCG tablet Take 1 tablet (1,000 mcg total) by mouth daily.    [name] levothyroxine (SYNTHROID, LEVOTHROID) 50 MCG tablet Take 1 tablet (50 mcg total) by mouth daily before breakfast. 30 tablet 3  . predniSONE (DELTASONE) 20 MG tablet Take 1 tablet (20 mg total) by mouth daily with breakfast. 3 tablet 0  . thiamine 100 MG tablet Take 1 tablet (100 mg total) by mouth daily. 30 tablet 0   No facility-administered medications prior to visit.      Allergies:   Atorvastatin and Other   Social History   Socioeconomic History  . Marital status: Married    Spouse name: Not on file  .  Number of children: Not on file  . Years of education: Not on file  . Highest education level: Not on file  Occupational History  . Not on file  Social Needs  . Financial resource strain: Not on file  . Food insecurity:    Worry: Not on file    Inability: Not on file  . Transportation needs:    Medical: Not on file    Non-medical: Not on file  Tobacco Use  . Smoking status: Never Smoker  . Smokeless tobacco: Current User    Types: Chew  Substance and Sexual Activity  . Alcohol use: Yes    Alcohol/week: 14.0 standard drinks    Types: 14 Cans of beer per week  . Drug use: No  . Sexual activity: Not on file  Lifestyle  . Physical activity:    Days per week: Not on file    Minutes per session: Not on file  . Stress: Not on file  Relationships  . Social connections:    Talks on phone: Not on file    Gets together: Not on file    Attends religious service: Not on file    Active member of club or organization: Not on file    Attends meetings of clubs or organizations: Not on file    Relationship status: Not on file  Other Topics Concern  . Not on file  Social History Narrative  . Not on file     ROS:   Please see the history of present illness.    ROS All other systems reviewed and are negative.   PHYSICAL  EXAM:   VS:  BP (!) 158/62   Pulse 76   Ht 6\' 1"  (1.854 m)   Wt 182 lb (82.6 kg)   SpO2 93%   BMI 24.01 kg/m    Recheck blood pressure 130/60 mmHg General: Alert and oriented x3, no distress, he does look a little tired and mucous membranes are a little dry.  Healthy left subclavian device site Head: no evidence of trauma, PERRL, EOMI, no exophtalmos or lid lag, no myxedema, no xanthelasma; normal ears, nose and oropharynx Neck: Flat jugular venous pulsations and no hepatojugular reflux; brisk carotid pulses without delay and no carotid bruits Chest: Clear to auscultation, no wheezes or rales Cardiovascular: Mild lateral displacement of the apical impulse, normal S1, paradoxically split S2, no diastolic murmurs, 1/6 aortic ejection murmur is early peaking, no gallops or rubs Abdomen: No tenderness distention or hepatosplenomegaly.  No abnormal pulsatility or bruits Extremities: No clubbing cyanosis or edema, normal distal pulses Neurological: Nonfocal Psych: Normal mood and affect    Wt Readings from Last 3 Encounters:  12/21/18 182 lb (82.6 kg)  09/08/18 168 lb (76.2 kg)  08/17/18 168 lb (76.2 kg)      Studies/Labs Reviewed:   EKG:  EKG is not ordered today.  Intracardiac electrograms show atrial fibrillation with biventricular pacing  Recent Labs: 01/28/2018: NT-Pro BNP 3,838 03/08/2018: B Natriuretic Peptide 1,000.1 08/27/2018: TSH 18.262 12/21/2018: ALT 7; BUN 23; Creatinine, Ser 1.15; Hemoglobin 11.7; Magnesium 1.5; Platelets 186; Potassium 3.0; Sodium 140   Lipid Panel    Component Value Date/Time   CHOL 158 12/19/2016 0841   TRIG 74 12/19/2016 0841   HDL 38 (L) 12/19/2016 0841   CHOLHDL 4.2 12/19/2016 0841   VLDL 15 12/19/2016 0841   LDLCALC 105 (H) 12/19/2016 0841    Echo March 07, 2018 - Left ventricle: The cavity size was mildly dilated. There was  mild concentric hypertrophy. Systolic function was moderately to   severely reduced. The estimated ejection  fraction was in the   range of 30% to 35%. Diffuse hypokinesis worse in the   anterolateral, inferolateral, inferior, and apical myocardium.   Doppler parameters are consistent with a reversible restrictive   pattern, indicative of decreased left ventricular diastolic   compliance and/or increased left atrial pressure (grade 3   diastolic dysfunction). Doppler parameters are consistent with   high ventricular filling pressure. - Aortic valve: A bioprosthesis was present. Valve area (VTI): 2.02   cm^2. Valve area (Vmax): 2.02 cm^2. Valve area (Vmean): 1.95   cm^2. - Mitral valve: Transvalvular velocity was within the normal range.   There was no evidence for stenosis. There was trivial   regurgitation. - Left atrium: The atrium was severely dilated. - Right ventricle: The cavity size was normal. Wall thickness was   normal. Systolic function was mildly reduced. - Right atrium: The atrium was moderately dilated. - Atrial septum: No defect or patent foramen ovale was identified   by color flow Doppler. - Tricuspid valve: There was mild regurgitation. - Pulmonary arteries: Systolic pressure was increased. PA peak   pressure: 44 mm Hg (S).  ------------------------------------------------------------------- Labs, prior tests, procedures, and surgery: H/O aortic valve replacement- 29 mm Mosaic CAD s/p redo CABG.   ASSESSMENT:    1. Chronic combined systolic and diastolic heart failure (HCC)   2. Coronary artery disease involving coronary bypass graft of native heart without angina pectoris   3. Cardiac resynchronization therapy defibrillator (CRT-D) in place   4. Hypercholesterolemia   5. Essential hypertension   6. Bilateral carotid artery stenosis   7. Other persistent atrial fibrillation   8. Follow-up visit for aortic valve replacement with bioprosthetic valve   9. Ascending aortic aneurysm (HCC)   10. CKD (chronic kidney disease) stage 3, GFR 30-59 ml/min (HCC)   11. Acute  idiopathic gout of multiple sites   12. Other iron deficiency anemia   13. Hypothyroidism due to medication      PLAN:  In order of problems listed above:  1. CHF: Although his weight is higher than it was at his last appointment, he does not appear hypervolemic.  On contrary he looks a little dehydrated.  Is likely due to diarrhea.  Asked him to hold his furosemide and we will check labs today.  Most recent EF 30-35%.  On carvedilol and a low-dose, not currently on R AAS inhibitors.  Compliance with medications has been an issue in the past. 2. CAD s/p redo CABG: Asymptomatic.  On statin. (cath in 2010 showing patent LIMA-LAD, free RIMA-OM, and SVG-OM, and 80% stenosis of small PLA branch). 3. CRT-D: Recent generator change out; comprehensive device check today shows normal findings.   4. HLP: Spotty compliance with statin 5. HTN: Little elevated when he first checked in, when I rechecked blood pressure 130/60 6. Carotid disease s/p bilateral carotid endarterectomies. No recent neurological complaints.  7. AFib: He has been in atrial fibrillation now for a couple of months, without any clear impact on his heart failure compensation.  He is not aware of the arrhythmia.  He is not anticoagulated: Continued alcohol use and poor compliance and previous GI bleeding make him a poor candidate for anticoagulation.  Not a good candidate for cardioversion.  There has been some deterioration in BiV pacing.  Might have to reconsider starting amiodarone, but for the time being would like to make sure that his electrolytes are  okay. 8. S/P AVR: March 2019 echo shows normal bioprosthetic valve function. 9. Ascending aortic aneurysm: Stable size.  5.0 cm by CT angiogram of the chest in June 2018, 5.1 cm in March 2019.  We discussed the fact that we could perform a referral to cardiac surgery, but he is not a great candidate for 3rd thoracotomy. 10. CKD 3:   We will recheck today 11. Gout: Active gout attack  right now, may be due to dehydration.  Not a good candidate for NSAIDs due to GI bleeding, renal insufficiency, heart failure.  Temporarily hold diuretic.  Brief course of prednisone. 12. Fe def anemia: Hemoglobin was almost back to normal at 12.1 in September.  Recheck today. 13. Hypothyroidism: Last TSH checked in September was 18.  Improving slowly since he started thyroid hormone supplements.  Levothyroxine is not on his list of medications today.  We will have to clarify whether he is taking it or not.  Has been off amiodarone for about 9 months now. 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 Asymptomatic.    Medication Adjustments/Labs and Tests Ordered: Current medicines are reviewed at length with the patient today.  Concerns regarding medicines are outlined above.  Medication changes, Labs and Tests ordered today are listed in the Patient Instructions below. Patient Instructions  Medication Instructions:  Dr Royann Shivers has recommended making the following medication changes: 1. HOLD Furosemide until stomach issues resolve 2. TAKE Prednisone 20 mg daily for 5 days for gout  If you need a refill on your cardiac medications before your next appointment, please call your pharmacy.   Lab work: Your physician recommends that you return for lab work TODAY.  If you have labs (blood work) drawn today and your tests are completely normal, you will receive your results only by: [name] MyChart Message (if you have MyChart) OR . A paper copy in the mail If you have any lab test that is abnormal or we need to change your treatment, we will call you to review the results.  Testing/Procedures: Remote monitoring is used to monitor your Pacemaker or ICD from home. This monitoring reduces the number of office visits required to check your device to one time per year. It allows [place] to keep an eye on the functioning of  your device to ensure it is working properly. You are scheduled for a device check from home on Tuesday, March 31st, 2020. You may send your transmission at any time that day. If you have a wireless device, the transmission will be sent automatically. After your physician reviews your transmission, you will receive a postcard with your next transmission date.  To improve our patient care and to more adequately follow your device, [facility] has decided, as a practice, to start following each patient four times a year with your home monitor. This means that you may experience a remote appointment that is close to an in-office appointment with your physician. Your insurance will apply at the same rate as other remote monitoring transmissions.  Follow-Up: Your physician recommends that you schedule a follow-up appointment in 2-4 weeks with Randall An, PA in [place].  Dr Royann Shivers recommends that you schedule a follow-up appointment in 6 months with an ICD check. You will receive a reminder letter in the mail two months in advance. If you don't receive a letter, please call our office to schedule the follow-up appointment.    Signed, Thurmon Fair, MD  12/22/2018 11:26  AM    [facility] [address], [place], [place]  [address] Phone: [phone]; Fax: [phone]

## 2018-12-21 NOTE — Patient Instructions (Addendum)
Medication Instructions:  Dr Royann Shiversroitoru has recommended making the following medication changes: 1. HOLD Furosemide until stomach issues resolve 2. TAKE Prednisone 20 mg daily for 5 days for gout  If you need a refill on your cardiac medications before your next appointment, please call your pharmacy.   Lab work: Your physician recommends that you return for lab work TODAY.  If you have labs (blood work) drawn today and your tests are completely normal, you will receive your results only by: [name]. MyChart Message (if you have MyChart) OR . A paper copy in the mail If you have any lab test that is abnormal or we need to change your treatment, we will call you to review the results.  Testing/Procedures: Remote monitoring is used to monitor your Pacemaker or ICD from home. This monitoring reduces the number of office visits required to check your device to one time per year. It allows us to keep an eye on the functioning of your device to ensure it is working properly. You are scheduled for a device check from home on Tuesday, March 31st, 2020. You may send your transmission at any time that day. If you have a wireless device, the transmission will be sent automatically. After your physician reviews your transmission, you will receive a postcard with your next transmission date.  To improve our patient care and to more adequately follow your device, [facility] has decided, as a practice, to start following each patient four times a year with your home monitor. This means that you may experience a remote appointment that is close to an in-office appointment with your physician. Your insurance will apply at the same rate as other remote monitoring transmissions.  Follow-Up: Your physician recommends that you schedule a follow-up appointment in 2-4 weeks with Randall AnBrittany Strader, [place] in [place].  Dr Royann Shiversroitoru recommends that you schedule a follow-up appointment in 6 months with an ICD check. You will  receive a reminder letter in the mail two months in advance. If you don't receive a letter, please call our office to schedule the follow-up appointment.

## 2018-12-31 ENCOUNTER — Telehealth: Payer: Self-pay

## 2018-12-31 DIAGNOSIS — I480 Paroxysmal atrial fibrillation: Secondary | ICD-10-CM

## 2018-12-31 DIAGNOSIS — I255 Ischemic cardiomyopathy: Secondary | ICD-10-CM

## 2018-12-31 DIAGNOSIS — I5042 Chronic combined systolic (congestive) and diastolic (congestive) heart failure: Secondary | ICD-10-CM

## 2018-12-31 NOTE — Telephone Encounter (Signed)
Labs ordered. [email or web address]

## 2018-12-31 NOTE — Telephone Encounter (Signed)
-----   Message from Thurmon Fair, MD sent at 12/22/2018 11:28 AM EST ----- Bmet, Mg, TSH, T4 in one week in [place], please MCr

## 2019-01-14 ENCOUNTER — Other Ambulatory Visit: Payer: Self-pay | Admitting: Cardiovascular Disease

## 2019-01-20 ENCOUNTER — Ambulatory Visit: Payer: Medicare Other | Admitting: Student

## 2019-01-20 ENCOUNTER — Other Ambulatory Visit (HOSPITAL_COMMUNITY)
Admission: RE | Admit: 2019-01-20 | Discharge: 2019-01-20 | Disposition: A | Discharge status: Home / Self Care | Payer: Medicare Other | Source: Ambulatory Visit | Attending: Student | Admitting: Student

## 2019-01-20 ENCOUNTER — Encounter: Payer: Self-pay | Admitting: Student

## 2019-01-20 VITALS — BP 142/86 | HR 73 | Ht 73.0 in | Wt 179.0 lb

## 2019-01-20 DIAGNOSIS — Z79899 Other long term (current) drug therapy: Secondary | ICD-10-CM

## 2019-01-20 DIAGNOSIS — E039 Hypothyroidism, unspecified: Secondary | ICD-10-CM

## 2019-01-20 DIAGNOSIS — I7121 Aneurysm of the ascending aorta, without rupture: Secondary | ICD-10-CM

## 2019-01-20 DIAGNOSIS — I25810 Atherosclerosis of coronary artery bypass graft(s) without angina pectoris: Secondary | ICD-10-CM

## 2019-01-20 DIAGNOSIS — I712 Thoracic aortic aneurysm, without rupture: Secondary | ICD-10-CM

## 2019-01-20 DIAGNOSIS — I10 Essential (primary) hypertension: Secondary | ICD-10-CM

## 2019-01-20 DIAGNOSIS — I255 Ischemic cardiomyopathy: Secondary | ICD-10-CM | POA: Diagnosis not present

## 2019-01-20 DIAGNOSIS — Z952 Presence of prosthetic heart valve: Secondary | ICD-10-CM

## 2019-01-20 DIAGNOSIS — E7800 Pure hypercholesterolemia, unspecified: Secondary | ICD-10-CM

## 2019-01-20 DIAGNOSIS — I5042 Chronic combined systolic (congestive) and diastolic (congestive) heart failure: Secondary | ICD-10-CM

## 2019-01-20 DIAGNOSIS — I480 Paroxysmal atrial fibrillation: Secondary | ICD-10-CM

## 2019-01-20 LAB — CUP PACEART INCLINIC DEVICE CHECK
Date Time Interrogation Session: 20200130131133
MDC IDC PG IMPLANT DT: 20190918

## 2019-01-20 LAB — TSH: TSH: 4.22 u[IU]/mL (ref 0.350–4.500)

## 2019-01-20 LAB — BASIC METABOLIC PANEL
Anion gap: 11 (ref 5–15)
BUN: 25 mg/dL — ABNORMAL HIGH (ref 8–23)
CALCIUM: 9.3 mg/dL (ref 8.9–10.3)
CO2: 25 mmol/L (ref 22–32)
CREATININE: 1.45 mg/dL — AB (ref 0.61–1.24)
Chloride: 105 mmol/L (ref 98–111)
GFR calc non Af Amer: 46 mL/min — ABNORMAL LOW (ref >60)
GFR, EST AFRICAN AMERICAN: 54 mL/min — AB (ref >60)
Glucose, Bld: 90 mg/dL (ref 70–99)
Potassium: 3.9 mmol/L (ref 3.5–5.1)
Sodium: 141 mmol/L (ref 135–145)

## 2019-01-20 LAB — MAGNESIUM: Magnesium: 1.8 mg/dL (ref 1.7–2.4)

## 2019-01-20 LAB — T4, FREE: Free T4: 0.78 ng/dL — ABNORMAL LOW (ref 0.82–1.77)

## 2019-01-20 NOTE — Progress Notes (Signed)
Cardiology Office Note    Date:  01/20/2019   ID:  [name], DOB 04/01/1941, MRN [id number]  PCP:  Patient, No Pcp Per  Cardiologist: Thurmon FairMihai Croitoru, MD    Chief Complaint  Patient presents with  . Follow-up    4 week visit    History of Present Illness:    [name] is a 78 y.o. male with past medical history of CAD (s/p CABG in 1992 and redo CABG in 2004, cath in 2010 showing patent LIMA-LAD, free RIMA-OM, and SVG-OM, and 80% stenosis of small PLA branch), chronic combined systolic and diastolicCHF, ischemic cardiomyopathy (EF previously 20-25%, at 30-35% by echo in 02/2018, s/p Medtronic CRT-D placement 06/2012 with gen-change in 08/2018), severe AS (s/p bioprosthetic AVR in 2010), PAD (s/p bilateral CEA), PAF (s/p ablation and not on anticoagulation 2ry to history ofGI bleed andcontinuedalcohol use),Stage 3 CKD, Hypothyroidismand AAA (5.1 cm by CT in 02/2018) who presents to the office today for 4-week follow-up.   He was last examined by Dr. Royann Shiversroitoru in 11/2018 and reported having diarrhea for the past 2 to 3 days and reported associated weakness with this. He denied any recent dyspnea or chest pain at that time. Device interrogation did show that he had been in uninterrupted atrial fibrillation since 10/2018. Thoracic impedance showed no evidence of volume overload. He appeared dehydrated by examination which was thought to be secondary to recent diarrhea and it was recommended he hold Lasix for several days then resume. Was not restarted on anticoagulation given history of GIB, continued alcohol abuse, and poor medication noncompliance. Labs at that time showed K+ was low at 3.0 and creatinine stable at 1.15. Repeat labs were recommended at the time of his office visit today.   In talking with the patient today, he reports overall doing well since his last office visit. Says that breathing has been at baseline and he denies any recent dyspnea on exertion,  orthopnea, PND, or lower extremity edema. Says that he did have episodes of "indigestion" around Christmas which he describes as a discomfort along his sternum which occurred after consuming fried cabbage and resolved with Rolaids. Denies any recurrent symptoms since and he has not had to utilize SL NTG.   He does not check his blood pressure regularly at home but it is slightly elevated to 144/84 during today's visit with similar results by recheck. Says he has been eating a significant amount of pork recently. Reports good compliance with his current medication regimen.  He is still consuming 3-4 beers on a daily basis along with homemade wine.  Past Medical History:  Diagnosis Date  . Anginal pain (HCC)   . Anxiety   . Aortic stenosis    a. s/p bioprosthetic AVR in 2010  . Arthritis    gout  . CAD (coronary artery disease)    a. s/p CABG in 1992 b. redo CABG in 2004 c. cath in 2010 showing patent LIMA-LAD, free RIMA-OM, and SVG-OM, and 80% stenosis of small PLA branch  . Chest pain   . CHF (congestive heart failure) (HCC)   . Dysrhythmia   . GERD (gastroesophageal reflux disease)    with gas and bloating probably cause of chest pain  . H/O atrial flutter    a. s/p ablation and not on anticoagulation secondary to GIB and alcohol abuse.   . H/O: gout   . Hyperlipidemia   . Hypertension   . ICD (implantable cardiac defibrillator) infection, s/p removal of ICD 07/12/2012  re-implanted Medtronic BiV ICD, serial number [id number]   . Ischemic cardiomyopathy    a. EF previously 20-25%, improved to 40-45% by echo in 03/2013 b. 10/2017: echo showing of 30-35% with Grade 2 DD, normal function of AVR, moderate MR.   . Myocardial infarction (HCC)   . 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 Shortness of breath     Past Surgical History:  Procedure Laterality Date  . BI-VENTRICULAR IMPLANTABLE CARDIOVERTER DEFIBRILLATOR Right 08/18/2012   Medtronic  BiV ICD, serial number [id number] ; Procedure: BI-VENTRICULAR IMPLANTABLE CARDIOVERTER DEFIBRILLATOR  (CRT-D);  Surgeon: Marinus Maw, MD;  Location: [facility] CATH LAB;  Service: Cardiovascular;  Laterality: Right;  . BIV ICD GENERATOR CHANGEOUT N/A 09/08/2018   Procedure: BIV ICD GENERATOR CHANGEOUT;  Surgeon: Thurmon Fair, MD;  Location: [facility] INVASIVE CV LAB;  Service: Cardiovascular;  Laterality: N/A;  . CARDIAC CATHETERIZATION  Dec 18, 2003   with a presentation of atrial flutter with rapid ventricular response. He had a cath and two stents to his PDA after his SVG to his RCA. His other graft were patent. He had LV with EF of 30%.   . CAROTID ENDARTERECTOMY  11/2002   left in December 2003 and known renal artery stenosis of 60-70%  . CORONARY ARTERY BYPASS GRAFT  1993   redo in 2000, he has had multiple MI previous to both procedures.  . IMPLANTABLE CARDIOVERTER DEFIBRILLATOR (ICD) GENERATOR CHANGE N/A 06/25/2012   Procedure: ICD GENERATOR CHANGE;  Surgeon: Marinus Maw, MD;  Location: [facility] CATH LAB;  Service: Cardiovascular;  Laterality: N/A;  . VALVE REPLACEMENT  2010    Current Medications: Outpatient Medications Prior to Visit  Medication Sig Dispense Refill  . aspirin EC 81 MG tablet Take 81 mg by mouth daily.     [name] atorvastatin (LIPITOR) 20 MG tablet TAKE 1 TABLET (20 MG TOTAL) BY MOUTH DAILY. 90 tablet 1  . carvedilol (COREG) 3.125 MG tablet TAKE 1 TABLET BY MOUTH TWICE A DAY 60 tablet 2  . colchicine 0.6 MG tablet TAKE 1 TABLET (0.6 MG TOTAL) BY MOUTH AS NEEDED (GOUT ATTACKS). 30 tablet 3  . ferrous sulfate (IRON SUPPLEMENT) 325 (65 FE) MG tablet Take 1 tablet (325 mg total) by mouth daily with breakfast. 180 tablet 3  . furosemide (LASIX) 40 MG tablet Take 1 tablet (40 mg total) by mouth daily. (Patient taking differently: Take 40 mg by mouth 2 (two) times daily. ) 30 tablet 0  . KLOR-CON M10 10 MEQ tablet TAKE 1 TABLET BY MOUTH EVERY DAY 30 tablet 5  . nitroGLYCERIN (NITROSTAT) 0.4 MG  SL tablet Place 1 tablet (0.4 mg) under the tongue every 5 minutes as needed for chest pain for up to 3 doses/call 9-1-1 and MD if no relief 25 tablet 0  . vitamin B-12 (CYANOCOBALAMIN) 1000 MCG tablet Take 1 tablet (1,000 mcg total) by mouth daily.     No facility-administered medications prior to visit.      Allergies:   Atorvastatin and Other   Social History   Socioeconomic History  . Marital status: Married    Spouse name: Not on file  . Number of children: Not on file  . Years of education: Not on file  . Highest education level: Not on file  Occupational History  . Not on file  Social Needs  . Financial resource strain: Not on file  . Food insecurity:    Worry: Not on file  Inability: Not on file  . Transportation needs:    Medical: Not on file    Non-medical: Not on file  Tobacco Use  . Smoking status: Never Smoker  . Smokeless tobacco: Current User    Types: Chew  Substance and Sexual Activity  . Alcohol use: Yes    Alcohol/week: 14.0 standard drinks    Types: 14 Cans of beer per week  . Drug use: No  . Sexual activity: Not on file  Lifestyle  . Physical activity:    Days per week: Not on file    Minutes per session: Not on file  . Stress: Not on file  Relationships  . Social connections:    Talks on phone: Not on file    Gets together: Not on file    Attends religious service: Not on file    Active member of club or organization: Not on file    Attends meetings of clubs or organizations: Not on file    Relationship status: Not on file  Other Topics Concern  . Not on file  Social History Narrative  . Not on file     Family History:  The patient's family history includes CAD in his brother.   Review of Systems:   Please see the history of present illness.     General:  No chills, fever, night sweats or weight changes.  Cardiovascular:  No edema, orthopnea, palpitations, paroxysmal nocturnal dyspnea. Positive for intermittent dyspnea (now  resolved).  Dermatological: No rash, lesions/masses Respiratory: No cough, dyspnea Urologic: No hematuria, dysuria Abdominal:   No nausea, vomiting, diarrhea, bright red blood per rectum, melena, or hematemesis Neurologic:  No visual changes, wkns, changes in mental status. All other systems reviewed and are otherwise negative except as noted above.   Physical Exam:    VS:  BP (!) 142/86   Pulse 73   Ht 6\' 1"  (1.854 m)   Wt 179 lb (81.2 kg)   SpO2 98%   BMI 23.62 kg/m    General: Well developed, elderly Caucasian male appearing in no acute distress. Head: Normocephalic, atraumatic, sclera non-icteric, no xanthomas, nares are without discharge.  Neck: No carotid bruits. JVD not elevated.  Lungs: Respirations regular and unlabored, without wheezes or rales.  Heart: Irregularly irregular. No S3 or S4.  No murmur, no rubs, or gallops appreciated. Abdomen: Soft, non-tender, non-distended with normoactive bowel sounds. No hepatomegaly. No rebound/guarding. No obvious abdominal masses. Msk:  Strength and tone appear normal for age. No joint deformities or effusions. Extremities: No clubbing or cyanosis. No lower extremity edema.  Distal pedal pulses are 2+ bilaterally. Neuro: Alert and oriented X 3. Moves all extremities spontaneously. No focal deficits noted. Psych:  Responds to questions appropriately with a normal affect. Skin: No rashes or lesions noted  Wt Readings from Last 3 Encounters:  01/20/19 179 lb (81.2 kg)  12/21/18 182 lb (82.6 kg)  09/08/18 168 lb (76.2 kg)     Studies/Labs Reviewed:   EKG:  EKG is not ordered today.  Recent Labs: 01/28/2018: NT-Pro BNP 3,838 03/08/2018: B Natriuretic Peptide 1,000.1 12/21/2018: ALT 7; Hemoglobin 11.7; Platelets 186 01/20/2019: BUN 25; Creatinine, Ser 1.45; Magnesium 1.8; Potassium 3.9; Sodium 141; TSH 4.220   Lipid Panel    Component Value Date/Time   CHOL 158 12/19/2016 0841   TRIG 74 12/19/2016 0841   HDL 38 (L) 12/19/2016  0841   CHOLHDL 4.2 12/19/2016 0841   VLDL 15 12/19/2016 0841   LDLCALC 105 (H) 12/19/2016 0347  Additional studies/ records that were reviewed today include:   Echocardiogram: 02/2018 Study Conclusions  - Left ventricle: The cavity size was mildly dilated. There was   mild concentric hypertrophy. Systolic function was moderately to   severely reduced. The estimated ejection fraction was in the   range of 30% to 35%. Diffuse hypokinesis worse in the   anterolateral, inferolateral, inferior, and apical myocardium.   Doppler parameters are consistent with a reversible restrictive   pattern, indicative of decreased left ventricular diastolic   compliance and/or increased left atrial pressure (grade 3   diastolic dysfunction). Doppler parameters are consistent with   high ventricular filling pressure. - Aortic valve: A bioprosthesis was present. Valve area (VTI): 2.02   cm^2. Valve area (Vmax): 2.02 cm^2. Valve area (Vmean): 1.95   cm^2. - Mitral valve: Transvalvular velocity was within the normal range.   There was no evidence for stenosis. There was trivial   regurgitation. - Left atrium: The atrium was severely dilated. - Right ventricle: The cavity size was normal. Wall thickness was   normal. Systolic function was mildly reduced. - Right atrium: The atrium was moderately dilated. - Atrial septum: No defect or patent foramen ovale was identified   by color flow Doppler. - Tricuspid valve: There was mild regurgitation. - Pulmonary arteries: Systolic pressure was increased. PA peak   pressure: 44 mm Hg (S).  Assessment:    1. COMBINED HEART FAILURE, CHRONIC   2. Cardiomyopathy, ischemic   3. Coronary artery disease involving coronary bypass graft of native heart without angina pectoris   4. PAF (paroxysmal atrial fibrillation) (HCC)   5. Hypothyroidism, unspecified type   6. H/O aortic valve replacement   7. Ascending aortic aneurysm (HCC)   8. Essential hypertension     9. Hypercholesterolemia   10. Medication management      Plan:   In order of problems listed above:  1. Chronic Combined Systolic and Diastolic CHF/ Ischemic Cardiomyopathy - the patient has a known reduced EF of 30-35% by echo in 02/2018 and he is s/p Medtronic CRT-D placement in 06/2012 with recent gen-change in 08/2018.  - He denies any recent dyspnea on exertion, orthopnea, PND, or lower extremity edema. While weight has increased over the past year, this has declined by 3 pounds since his office visit in 11/2018.  He does consume more than an ideal amount of sodium as outlined above and reducing sodium intake was reviewed with the patient. He remains on Lasix 40 mg daily and will recheck a BMET today to reassess kidney function and K+ levels. Remains on Coreg and he previously self-discontinued ACE-I.   2. CAD - s/p CABG in 1992 and redo CABG in 2004, cath in 2010 showing patent LIMA-LAD, free RIMA-OM, and SVG-OM, and 80% stenosis of small PLA branch. He was previously having episodes of chest pain after consuming fried cabbage which resolved with antacids and denies any recent exertional chest pain. Breathing has been at baseline. - Continue with medical management including ASA, beta-blocker, and statin therapy.  3. Paroxysmal Atrial Fibrillation - Most recent device interrogation showed that he had been in atrial fibrillation since 10/2018.  Appears to be in atrial fibrillation by examination today. He denies any recent palpitations and heart rate is well controlled in the 70's. Continue Coreg 3.125 mg twice daily for rate-control.  - he is not on anticoagulation secondary to history ofGI bleed andcontinuedalcohol use. He is still consuming 3-4 beers per day along with homemade wine. Remains on ASA  81mg  daily.   4. Hypothyroidism - Previously documented when on Amiodarone and this was since discontinued. It was unclear if he was taking Synthroid at the time of his last office visit  and the patient confirmed to me today that he is NOT taking the medication as the pharmacy did not have refills for this and he thought it was intentionally discontinued. Will recheck TSH today given that he has been off of Amiodarone. If TSH remains significantly elevated, will restart Synthroid with repeat TSH and Free T4 in 4 weeks.   5. Aortic Stenosis - s/p bioprosthetic AVR in 2010. Echocardiogram in 02/2018 showed no significant stenosis or regurgitation.  6. AAA - at 5.1 cm by CT imaging in 02/2018. Continue with surveillance scans but of note he is not felt to be a good candidate for a 3rd sternotomy given his prior CABG and redo CABG.  7. HTN - BP is slightly elevated at 144/84 during today's visit with similar results by recheck. He was previously having issues with hypotension and has not checked his blood pressure regularly at home. He is currently on Coreg 3.125 mg twice daily and I encouraged him to follow BP in the ambulatory setting (patient's daughter is a Engineer, civil (consulting) and can check this for him per his report). If BP remains above goal, would consider titration of Coreg to 6.25 mg twice daily.  8. HLD - He is overdue for FLP but is not fasting today. LFT's were WNL in 11/2018. Remains on Atorvastatin 20 mg daily.  Goal LDL is less than 70 in the setting of known CAD.  Medication Adjustments/Labs and Tests Ordered: Current medicines are reviewed at length with the patient today.  Concerns regarding medicines are outlined above.  Medication changes, Labs and Tests ordered today are listed in the Patient Instructions below. Patient Instructions  Medication Instructions:  Your physician recommends that you continue on your current medications as directed. Please refer to the Current Medication list given to you today.  If you need a refill on your cardiac medications before your next appointment, please call your pharmacy.   Lab work: Your physician recommends that you return for lab  work in: Today   If you have labs (blood work) drawn today and your tests are completely normal, you will receive your results only by: [name] MyChart Message (if you have MyChart) OR . A paper copy in the mail If you have any lab test that is abnormal or we need to change your treatment, we will call you to review the results.  Testing/Procedures: NONE   Follow-Up: At [facility], you and your health needs are our priority.  As part of our continuing mission to provide you with exceptional heart care, we have created designated Provider Care Teams.  These Care Teams include your primary Cardiologist (physician) and Advanced Practice Providers (APPs -  Physician Assistants and Nurse Practitioners) who all work together to provide you with the care you need, when you need it. You will need a follow up appointment in 3 months.  Please call our office 2 months in advance to schedule this appointment.  You may see Thurmon Fair, MD or one of the following Advanced Practice Providers on your designated Care Team:   [place], PA-C [facility]) . Jacolyn Reedy, PA-C [facility] Office)  Any Other Special Instructions Will Be Listed Below (If Applicable). Thank you for choosing [facility]!     Signed, Ellsworth Lennox, PA-C  01/20/2019 5:41 PM  [facility] [address] [place], [place] [address] Phone: [phone] Fax: [phone]

## 2019-01-20 NOTE — Progress Notes (Signed)
Alcohol is his preferred diuretic... MCr  (P.S. and you are his preferred APP!)

## 2019-01-20 NOTE — Patient Instructions (Signed)
Medication Instructions:  Your physician recommends that you continue on your current medications as directed. Please refer to the Current Medication list given to you today.  If you need a refill on your cardiac medications before your next appointment, please call your pharmacy.   Lab work: Your physician recommends that you return for lab work in: Today   If you have labs (blood work) drawn today and your tests are completely normal, you will receive your results only by: [name] MyChart Message (if you have MyChart) OR . A paper copy in the mail If you have any lab test that is abnormal or we need to change your treatment, we will call you to review the results.  Testing/Procedures: NONE   Follow-Up: At [facility], you and your health needs are our priority.  As part of our continuing mission to provide you with exceptional heart care, we have created designated Provider Care Teams.  These Care Teams include your primary Cardiologist (physician) and Advanced Practice Providers (APPs -  Physician Assistants and Nurse Practitioners) who all work together to provide you with the care you need, when you need it. You will need a follow up appointment in 3 months.  Please call our office 2 months in advance to schedule this appointment.  You may see Thurmon Fair, MD or one of the following Advanced Practice Providers on your designated Care Team:   [place], PA-C [facility]) . Jacolyn Reedy, PA-C [facility] Office)  Any Other Special Instructions Will Be Listed Below (If Applicable). Thank you for choosing [facility]!

## 2019-01-21 ENCOUNTER — Telehealth: Payer: Self-pay | Admitting: *Deleted

## 2019-01-21 LAB — CUP PACEART INCLINIC DEVICE CHECK
Date Time Interrogation Session: 20200131163025
Implantable Lead Implant Date: 20130828
Implantable Lead Implant Date: 20130828
Implantable Lead Implant Date: 20130828
Implantable Lead Location: 753858
Implantable Lead Location: 753860
Implantable Lead Model: 4194
Implantable Lead Model: 5076
Implantable Lead Model: 6935
MDC IDC LEAD LOCATION: 753859
MDC IDC PG IMPLANT DT: 20190918

## 2019-01-21 NOTE — Telephone Encounter (Signed)
-----   Message from Ellsworth Lennox, [place] sent at 01/21/2019  8:07 AM EST ----- Please let the patient know his thyroid function has improved as of now despite being off Synthroid. Will continue to follow. Sodium and potassium levels remain stable. Kidney function slightly abnormal as compared to labs from 11/2018 but consistent with values over the past year. Please confirm he is only taking Lasix 40mg  daily and no additional doses. Keep fluid intake to 1.5 - 2.0L per day and try to reduce alcohol intake. Would recommend repeat BMET in 3-4 weeks for reassessment.

## 2019-01-21 NOTE — Telephone Encounter (Signed)
Called patient with test results. No answer. Left message to call back.  

## 2019-01-25 ENCOUNTER — Encounter: Payer: Self-pay | Admitting: *Deleted

## 2019-01-25 NOTE — Telephone Encounter (Signed)
Labs performed by Randall An, PA on 01/20/19.

## 2019-02-08 ENCOUNTER — Other Ambulatory Visit: Payer: Self-pay | Admitting: Cardiovascular Disease

## 2019-02-11 ENCOUNTER — Other Ambulatory Visit: Payer: Self-pay | Admitting: Cardiovascular Disease

## 2019-03-04 ENCOUNTER — Other Ambulatory Visit: Payer: Self-pay | Admitting: Cardiovascular Disease

## 2019-03-08 ENCOUNTER — Telehealth: Payer: Self-pay | Admitting: Cardiovascular Disease

## 2019-03-08 DIAGNOSIS — Z79899 Other long term (current) drug therapy: Secondary | ICD-10-CM

## 2019-03-08 NOTE — Telephone Encounter (Signed)
    He has experienced issues like this in the past and was prescribed Colchicine by Dr. Royann Shivers. Can prescribe Colchicine 0.6mg  daily for 4 days to see if this helps with his symptoms. Would not use a longer course given his variable renal function in the past. If symptoms do not improve and he still does not have a PCP, he should be evaluated at Urgent Care.   Signed, Ellsworth Lennox, PA-C 03/08/2019, 2:05 PM Pager: [phone]

## 2019-03-08 NOTE — Telephone Encounter (Signed)
Patient has been on colchicine for 3 days, will stop tomorrow.Asks about lab work from January, results given, mailed lab slip for [employer]

## 2019-03-08 NOTE — Telephone Encounter (Signed)
Patient states for the last 4 days he has pain in his "neck bone", hands and toes.Says he can't make a fist with his hands as they are so stiff. He says he has gout but did not expect to have it involve all these areas. He has been taking tylenol 500 mg for it.His weight today is 180 lbs. He still does not have a pcp. 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

## 2019-03-08 NOTE — Telephone Encounter (Signed)
Pt is requesting to have [place] give him a call. He's having some problems

## 2019-03-22 ENCOUNTER — Ambulatory Visit (INDEPENDENT_AMBULATORY_CARE_PROVIDER_SITE_OTHER): Payer: Medicare Other | Admitting: *Deleted

## 2019-03-22 ENCOUNTER — Other Ambulatory Visit: Payer: Self-pay

## 2019-03-22 DIAGNOSIS — I255 Ischemic cardiomyopathy: Secondary | ICD-10-CM | POA: Diagnosis not present

## 2019-03-22 LAB — CUP PACEART REMOTE DEVICE CHECK
Battery Remaining Longevity: 88 mo
Brady Statistic AP VP Percent: 2.92 %
Brady Statistic AP VS Percent: 0.4 %
Brady Statistic AS VS Percent: 26.82 %
Brady Statistic RA Percent Paced: 2.21 %
Brady Statistic RV Percent Paced: 74.44 %
HighPow Impedance: 63 Ohm
Implantable Lead Implant Date: 20130828
Implantable Lead Implant Date: 20130828
Implantable Lead Location: 753858
Implantable Lead Location: 753859
Implantable Lead Location: 753860
Implantable Lead Model: 4194
Implantable Lead Model: 5076
Implantable Lead Model: 6935
Implantable Pulse Generator Implant Date: 20190918
Lead Channel Impedance Value: 247 Ohm
Lead Channel Impedance Value: 247 Ohm
Lead Channel Impedance Value: 342 Ohm
Lead Channel Impedance Value: 456 Ohm
Lead Channel Pacing Threshold Amplitude: 0.75 V
Lead Channel Pacing Threshold Amplitude: 0.875 V
Lead Channel Pacing Threshold Pulse Width: 0.4 ms
Lead Channel Pacing Threshold Pulse Width: 0.4 ms
Lead Channel Pacing Threshold Pulse Width: 0.4 ms
Lead Channel Sensing Intrinsic Amplitude: 0.375 mV
Lead Channel Sensing Intrinsic Amplitude: 0.375 mV
Lead Channel Sensing Intrinsic Amplitude: 4.875 mV
Lead Channel Sensing Intrinsic Amplitude: 4.875 mV
Lead Channel Setting Pacing Amplitude: 1.5 V
Lead Channel Setting Pacing Amplitude: 1.5 V
Lead Channel Setting Pacing Amplitude: 2.5 V
Lead Channel Setting Pacing Pulse Width: 0.4 ms
Lead Channel Setting Sensing Sensitivity: 0.3 mV
MDC IDC LEAD IMPLANT DT: 20130828
MDC IDC MSMT BATTERY VOLTAGE: 2.99 V
MDC IDC MSMT LEADCHNL LV IMPEDANCE VALUE: 513 Ohm
MDC IDC MSMT LEADCHNL LV IMPEDANCE VALUE: 608 Ohm
MDC IDC MSMT LEADCHNL RA PACING THRESHOLD AMPLITUDE: 0.625 V
MDC IDC SESS DTM: 20200331073423
MDC IDC SET LEADCHNL RV PACING PULSEWIDTH: 0.4 ms
MDC IDC STAT BRADY AS VP PERCENT: 69.86 %

## 2019-03-30 ENCOUNTER — Encounter: Payer: Self-pay | Admitting: Cardiology

## 2019-03-30 NOTE — Progress Notes (Signed)
Remote ICD transmission.   

## 2019-04-19 ENCOUNTER — Telehealth: Payer: Self-pay

## 2019-04-19 ENCOUNTER — Telehealth: Payer: Self-pay | Admitting: Cardiovascular Disease

## 2019-04-19 NOTE — Telephone Encounter (Signed)
FORWARD TO SCHEDULER  - PHONE CALL WAS DONE EARLIER

## 2019-04-19 NOTE — Telephone Encounter (Signed)
New message   Patient's wife is returning call about changing office visit. Please call.

## 2019-04-19 NOTE — Telephone Encounter (Signed)
LVM for patient to call back on whether he wants a video, phone or office visit.

## 2019-04-19 NOTE — Telephone Encounter (Signed)
Spoke to patient's wife.Advised I was calling I received a message patient wanted to come to office tomorrow for his visit with Dr.Croitoru.Advised Dr.Croitoru is only doing virtual visits tomorrow.Stated husband is doing ok.He is presently out in garden working.Stated he is sob, but it don't seem any worse than normal.Advised appointment changed to a tele visit tomorrow at 11:00 am.

## 2019-04-20 ENCOUNTER — Telehealth (INDEPENDENT_AMBULATORY_CARE_PROVIDER_SITE_OTHER): Payer: Medicare Other | Admitting: Cardiovascular Disease

## 2019-04-20 ENCOUNTER — Encounter: Payer: Self-pay | Admitting: Cardiovascular Disease

## 2019-04-20 ENCOUNTER — Other Ambulatory Visit: Payer: Self-pay

## 2019-04-20 ENCOUNTER — Telehealth: Payer: Self-pay | Admitting: Cardiovascular Disease

## 2019-04-20 ENCOUNTER — Ambulatory Visit: Payer: Medicare Other | Admitting: Cardiovascular Disease

## 2019-04-20 VITALS — BP 140/86 | HR 76

## 2019-04-20 DIAGNOSIS — N183 Chronic kidney disease, stage 3 unspecified: Secondary | ICD-10-CM

## 2019-04-20 DIAGNOSIS — I495 Sick sinus syndrome: Secondary | ICD-10-CM

## 2019-04-20 DIAGNOSIS — I5042 Chronic combined systolic (congestive) and diastolic (congestive) heart failure: Secondary | ICD-10-CM

## 2019-04-20 DIAGNOSIS — I712 Thoracic aortic aneurysm, without rupture: Secondary | ICD-10-CM

## 2019-04-20 DIAGNOSIS — I255 Ischemic cardiomyopathy: Secondary | ICD-10-CM

## 2019-04-20 DIAGNOSIS — I480 Paroxysmal atrial fibrillation: Secondary | ICD-10-CM | POA: Diagnosis not present

## 2019-04-20 DIAGNOSIS — Z79899 Other long term (current) drug therapy: Secondary | ICD-10-CM

## 2019-04-20 DIAGNOSIS — I10 Essential (primary) hypertension: Secondary | ICD-10-CM

## 2019-04-20 DIAGNOSIS — Z952 Presence of prosthetic heart valve: Secondary | ICD-10-CM

## 2019-04-20 DIAGNOSIS — I25810 Atherosclerosis of coronary artery bypass graft(s) without angina pectoris: Secondary | ICD-10-CM

## 2019-04-20 DIAGNOSIS — I7121 Aneurysm of the ascending aorta, without rupture: Secondary | ICD-10-CM

## 2019-04-20 DIAGNOSIS — Z5181 Encounter for therapeutic drug level monitoring: Secondary | ICD-10-CM

## 2019-04-20 DIAGNOSIS — Z95810 Presence of automatic (implantable) cardiac defibrillator: Secondary | ICD-10-CM

## 2019-04-20 NOTE — Patient Instructions (Signed)
Medication Instructions:  Take 80 mg (double) Furosemide today and tomorrow (4/29 and 4/30).  Take Colchicine 0.6 mg-- 1 tab today and tomorrow (4/29 and 4/30)  If you need a refill on your cardiac medications before your next appointment, please call your pharmacy.   Testing/Procedures: Send a Nurse, adult from your defibrillator tonight.  Follow-Up: At [facility], you and your health needs are our priority.  As part of our continuing mission to provide you with exceptional heart care, we have created designated Provider Care Teams.  These Care Teams include your primary Cardiologist (physician) and Advanced Practice Providers (APPs -  Physician Assistants and Nurse Practitioners) who all work together to provide you with the care you need, when you need it. . You have been scheduled for a follow-up virtual telephone visit with Dr. Royann Shivers on Friday, 04/22/19 at 11:30 am.

## 2019-04-20 NOTE — Progress Notes (Signed)
Virtual Visit via Telephone Note   This visit type was conducted due to national recommendations for restrictions regarding the COVID-19 Pandemic (e.g. social distancing) in an effort to limit this patient's exposure and mitigate transmission in our community.  Due to his co-morbid illnesses, this patient is at least at moderate risk for complications without adequate follow up.  This format is felt to be most appropriate for this patient at this time.  The patient did not have access to video technology/had technical difficulties with video requiring transitioning to audio format only (telephone).  All issues noted in this document were discussed and addressed.  No physical exam could be performed with this format.  Please refer to the patient's chart for his  consent to telehealth for [facility].   Evaluation Performed:  Follow-up visit  Date:  04/20/2019   ID:  [name], DOB 02/04/1941, MRN [id number]  Patient Location: Home Provider Location: Home  PCP:  Patient, No Pcp Per  Cardiologist:  Thurmon Fair, MD  Electrophysiologist:  None   Chief Complaint:  Dyspnea on exertion  History of Present Illness:    [name] is a 78 y.o. male with  a long-standing history of CAD with redo CABG, aortic valve replacement with bioprosthesis (29 mm mosaic), chronic  systolic heart failure due to ischemic cardiomyopathy, recurrent persistent atrial fibrillation, CRT-D, bilateral carotid stenoses and endarterectomy, returning for follow-up.  He is doing worse than usual.  Still able to work in his garden but has NYHA functional class II dyspnea.  When he carries a 10 pound bag he has to stop every 50 feet to catch his breath.  Yesterday his weight was 173 pounds, today he reports 170 pounds.  Previously estimated dry weight was roughly 168 pounds.  He does not have edema and denies orthopnea or PND.  He has occasional "indigestion" that is different from his previous angina and  resolves with Rolaids.  He last had a gout attack about a week ago.  He denies syncope or palpitations, but has occasional dizziness when he gets up in the morning.  His most recent echocardiogram in March 2019 showed left ventricular ejection fraction of 30-35% with wall motion abnormalities in the inferior-inferolateral and anteroapical distribution.  Aortic prosthesis gradients were normal and there was no significant mitral regurgitation.  The Doppler parameters were consistent with high filling pressures.  Device interrogation was performed on March 31 and shows that he has been in persistent atrial fibrillation since early December.  Roughly around the same time his biventricular pacing efficiency deteriorated to about 75%.  The OptiVol steadily increased throughout December, January and February and is staying abnormal throughout the month of March.  Activity level remains remarkably good at 3.9 hours/day, but this was 1 or 2 hours less than his average in the past.  Otherwise device function is normal and there has been no evidence of high ventricular rates or device therapy.  The patient does not have symptoms concerning for COVID-19 infection (fever, chills, cough, or new shortness of breath).   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  We discussed appropriate social distancing and safety precautions.  He understands that he is at much higher risk for serious coronavirus related complications than the average patient.    Past Medical History:  Diagnosis Date  . Anginal pain (HCC)   . Anxiety   .  Aortic stenosis    a. s/p bioprosthetic AVR in 2010  . Arthritis    gout  . CAD (coronary artery disease)    a. s/p CABG in 1992 b. redo CABG in 2004 c. cath in 2010 showing patent LIMA-LAD, free RIMA-OM, and SVG-OM, and 80% stenosis of small PLA branch  . Chest pain   . CHF (congestive heart  failure) (HCC)   . Dysrhythmia   . GERD (gastroesophageal reflux disease)    with gas and bloating probably cause of chest pain  . H/O atrial flutter    a. s/p ablation and not on anticoagulation secondary to GIB and alcohol abuse.   . H/O: gout   . Hyperlipidemia   . Hypertension   . ICD (implantable cardiac defibrillator) infection, s/p removal of ICD 07/12/2012   re-implanted Medtronic BiV ICD, serial number [id number]   . Ischemic cardiomyopathy    a. EF previously 20-25%, improved to 40-45% by echo in 03/2013 b. 10/2017: echo showing of 30-35% with Grade 2 DD, normal function of AVR, moderate MR.   . Myocardial infarction (HCC)   . 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 Shortness of breath    Past Surgical History:  Procedure Laterality Date  . BI-VENTRICULAR IMPLANTABLE CARDIOVERTER DEFIBRILLATOR Right 08/18/2012   Medtronic BiV ICD, serial number [id number] ; Procedure: BI-VENTRICULAR IMPLANTABLE CARDIOVERTER DEFIBRILLATOR  (CRT-D);  Surgeon: Marinus MawGregg W Taylor, MD;  [facility];  Service: Cardiovascular;  Laterality: Right;  . BIV ICD GENERATOR CHANGEOUT N/A 09/08/2018   Procedure: BIV ICD GENERATOR CHANGEOUT;  Surgeon: Thurmon Fairroitoru, Madilyn Cephas, MD;  Location: [facility] INVASIVE CV LAB;  Service: Cardiovascular;  Laterality: N/A;  . CARDIAC CATHETERIZATION  Dec 18, 2003   with a presentation of atrial flutter with rapid ventricular response. He had a cath and two stents to his PDA after his SVG to his RCA. His other graft were patent. He had LV with EF of 30%.   . CAROTID ENDARTERECTOMY  11/2002   left in December 2003 and known renal artery stenosis of 60-70%  . CORONARY ARTERY BYPASS GRAFT  1993   redo in 2000, he has had multiple MI previous to both procedures.  . IMPLANTABLE CARDIOVERTER DEFIBRILLATOR (ICD) GENERATOR CHANGE N/A 06/25/2012   Procedure: ICD GENERATOR CHANGE;  Surgeon: Marinus MawGregg W Taylor, MD;  [facility];  Service:  Cardiovascular;  Laterality: N/A;  . VALVE REPLACEMENT  2010     Current Meds  Medication Sig  . aspirin EC 81 MG tablet Take 81 mg by mouth daily.   [name]. atorvastatin (LIPITOR) 20 MG tablet TAKE 1 TABLET BY MOUTH EVERY DAY  . carvedilol (COREG) 3.125 MG tablet TAKE 1 TABLET BY MOUTH TWICE A DAY  . colchicine 0.6 MG tablet TAKE 1 TABLET (0.6 MG TOTAL) BY MOUTH AS NEEDED (GOUT ATTACKS).  . furosemide (LASIX) 40 MG tablet Take 1 tablet (40 mg total) by mouth 2 (two) times daily.  [name]. KLOR-CON M10 10 MEQ tablet TAKE 1 TABLET BY MOUTH EVERY DAY  . nitroGLYCERIN (NITROSTAT) 0.4 MG SL tablet Place 1 tablet (0.4 mg) under the tongue every 5 minutes as needed for chest pain for up to 3 doses/call 9-1-1 and MD if no relief  . vitamin B-12 (CYANOCOBALAMIN) 1000 MCG tablet Take 1 tablet (1,000 mcg total) by mouth daily.     Allergies:   Atorvastatin and Other   Social History   Tobacco Use  . Smoking status: Never  Smoker  . Smokeless tobacco: Current User    Types: Chew  Substance Use Topics  . Alcohol use: Yes    Alcohol/week: 14.0 standard drinks    Types: 14 Cans of beer per week  . Drug use: No     Family Hx: The patient's family history includes CAD in his brother.  ROS:   Please see the history of present illness.     All other systems reviewed and are negative.   Prior CV studies:   The following studies were reviewed today:  March 22, 2019 defibrillator download  Labs/Other Tests and Data Reviewed:    EKG:  An ECG dated 09/08/2018 was personally reviewed today and demonstrated:  Atrial sensed, ventricular sensed rhythm with prominent positive R waves in lead V1 consistent with effective left ventricular pacing  Recent Labs: 12/21/2018: ALT 7; Hemoglobin 11.7; Platelets 186 01/20/2019: BUN 25; Creatinine, Ser 1.45; Magnesium 1.8; Potassium 3.9; Sodium 141; TSH 4.220   Recent Lipid Panel Lab Results  Component Value Date/Time   CHOL 158 12/19/2016 08:41 AM   TRIG 74  12/19/2016 08:41 AM   HDL 38 (L) 12/19/2016 08:41 AM   CHOLHDL 4.2 12/19/2016 08:41 AM   LDLCALC 105 (H) 12/19/2016 08:41 AM    Wt Readings from Last 3 Encounters:  01/20/19 179 lb (81.2 kg)  12/21/18 182 lb (82.6 kg)  09/08/18 168 lb (76.2 kg)     Objective:    Vital Signs:  BP 140/86   Pulse 76    VITAL SIGNS:  reviewed Unable to examine.  Video link failed.  ASSESSMENT & PLAN:    1. CHF: NYHA functional class II exertional dyspnea.  His device does show evidence of volume overload, steadily increasing since loss of sinus rhythm last December.  Probably due to reduction in CRT efficiency. Most recent EF 30-35%.    Taking low-dose carvedilol, has had issues with hypotension on ACE inhibitors in the past.  We will increase his furosemide to 80 mg daily and potassium chloride to 20 mEq daily.  He will do an additional download from his device tonight and we will revisit his symptoms in 2 days time with a telemedicine visit. 2. CAD s/p redo CABG:  He does not have angina pectoris.  On statin. (cath in 2010 showing patent LIMA-LAD, free RIMA-OM, and SVG-OM, and 80% stenosis of small PLA branch). 3. CRT-D:  Reduced efficiency of biventricular pacing due to atrial fibrillation with synchronous worsening of thoracic impedance and reduction in activity level.  Consider restarting amiodarone, but the fact that he does not take anticoagulation is of concern. 4. HLP:  He reports he is taking a statin medication 5. HTN:  Generally well controlled. 6. Carotid disease s/p bilateral carotid endarterectomies. No recent neurological complaints.  7. AFib:  Initially the atrial fibrillation did not seem to have much impact on his functional capacity, but this seems to not be changing.  He remains unaware of the arrhythmia itself.[name]  He is not aware of the arrhythmia.  He is not anticoagulated. Continued alcohol use and poor compliance with medications as well as history of GI bleeding have been  disincentives to restarting anticoagulants which also makes him a poor candidate for cardioversion. Might have to reconsider starting amiodarone.  8. S/P AVR: March 2019 echo shows normal bioprosthetic valve function. 9. Ascending aortic aneurysm: Stable size.  5.0 cm by CT angiogram of the chest in June 2018, 5.1 cm in March 2019.  We discussed the fact that we  could perform a referral to cardiac surgery, but he is not a great candidate for 3rd thoracotomy. 10. CKD 3:    Will need to have repeat labs after the adjustment in his medications. 11. Gout:  Currently inactive, but I asked him to preventively start taking colchicine as we increase his diuretics.  Not a good candidate for NSAIDs due to GI bleeding, renal insufficiency, heart failure.    If he does get a gout attack, will use a short course of steroids. 12. Fe def anemia: Hemoglobin remained a little low, 11.7  with normocytic indices when last checked on December 31 13. Hypothyroidism: Last TSH checked in January had normalized at 4.2 with a borderline low free T4 of 0.78.  Has been off amiodarone for about 12 months now. 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 Asymptomatic.   COVID-19 Education: The signs and symptoms of COVID-19 were discussed with the patient and how to seek care for testing (follow up with PCP or arrange E-visit).  The importance of social distancing was discussed today.  Time:   Today, I have spent 31 minutes with the patient with telehealth technology discussing the above problems.     Medication Adjustments/Labs and Tests Ordered: Current medicines are reviewed at length with the patient today.  Concerns regarding medicines are outlined above.   Tests Ordered: No orders of the defined types were placed in this encounter.   Medication Changes: No orders of the defined types were placed in this encounter.    Disposition:  Follow up 2 days  Signed, Thurmon Fair, MD  04/20/2019 10:31 AM    [facility]

## 2019-04-22 ENCOUNTER — Telehealth (INDEPENDENT_AMBULATORY_CARE_PROVIDER_SITE_OTHER): Payer: Medicare Other | Admitting: Cardiovascular Disease

## 2019-04-22 DIAGNOSIS — E7800 Pure hypercholesterolemia, unspecified: Secondary | ICD-10-CM

## 2019-04-22 DIAGNOSIS — Z95810 Presence of automatic (implantable) cardiac defibrillator: Secondary | ICD-10-CM

## 2019-04-22 DIAGNOSIS — N183 Chronic kidney disease, stage 3 unspecified: Secondary | ICD-10-CM

## 2019-04-22 DIAGNOSIS — I671 Cerebral aneurysm, nonruptured: Secondary | ICD-10-CM

## 2019-04-22 DIAGNOSIS — I5043 Acute on chronic combined systolic (congestive) and diastolic (congestive) heart failure: Secondary | ICD-10-CM

## 2019-04-22 DIAGNOSIS — Z9889 Other specified postprocedural states: Secondary | ICD-10-CM

## 2019-04-22 DIAGNOSIS — M10442 Other secondary gout, left hand: Secondary | ICD-10-CM

## 2019-04-22 DIAGNOSIS — I5042 Chronic combined systolic (congestive) and diastolic (congestive) heart failure: Secondary | ICD-10-CM

## 2019-04-22 DIAGNOSIS — I10 Essential (primary) hypertension: Secondary | ICD-10-CM

## 2019-04-22 DIAGNOSIS — I255 Ischemic cardiomyopathy: Secondary | ICD-10-CM

## 2019-04-22 DIAGNOSIS — I739 Peripheral vascular disease, unspecified: Secondary | ICD-10-CM

## 2019-04-22 DIAGNOSIS — Z952 Presence of prosthetic heart valve: Secondary | ICD-10-CM

## 2019-04-22 DIAGNOSIS — I25810 Atherosclerosis of coronary artery bypass graft(s) without angina pectoris: Secondary | ICD-10-CM | POA: Diagnosis not present

## 2019-04-22 DIAGNOSIS — I4819 Other persistent atrial fibrillation: Secondary | ICD-10-CM

## 2019-04-22 MED ORDER — CARVEDILOL 6.25 MG PO TABS: 6.2500 mg | ORAL_TABLET | Freq: Two times a day (BID) | ORAL | 11 refills | Status: DC

## 2019-04-22 MED ORDER — AMIODARONE HCL 200 MG PO TABS: 200.0000 mg | ORAL_TABLET | Freq: Every day | ORAL | 11 refills | Status: DC

## 2019-04-22 MED ORDER — FUROSEMIDE 40 MG PO TABS: 40.0000 mg | ORAL_TABLET | Freq: Two times a day (BID) | ORAL | 11 refills | Status: DC

## 2019-04-22 NOTE — Progress Notes (Signed)
Virtual Visit via Telephone Note   This visit type was conducted due to national recommendations for restrictions regarding the COVID-19 Pandemic (e.g. social distancing) in an effort to limit this patient's exposure and mitigate transmission in our community.  Due to his co-morbid illnesses, this patient is at least at moderate risk for complications without adequate follow up.  This format is felt to be most appropriate for this patient at this time.  The patient did not have access to video technology/had technical difficulties with video requiring transitioning to audio format only (telephone).  All issues noted in this document were discussed and addressed.  No physical exam could be performed with this format.  Please refer to the patient's chart for his  consent to telehealth for [facility].   Evaluation Performed:  Follow-up visit  Date:  04/22/2019   ID:  Donald AmasDonald R Cly, DOB April 12, 1941, MRN [id number]  Patient Location: Home Provider Location: Home  PCP:  Patient, No Pcp Per  Cardiologist:  Thurmon FairMihai Elio Haden, MD  Electrophysiologist:  None   Chief Complaint:  Dyspnea on exertion  History of Present Illness:    [name] is a 78 y.o. male with  a long-standing history of CAD with redo CABG, aortic valve replacement with bioprosthesis (29 mm mosaic), chronic  systolic heart failure due to ischemic cardiomyopathy, recurrent persistent atrial fibrillation, CRT-D, bilateral carotid stenoses and endarterectomy, returning for follow-up.  A couple of days ago we doubled his dose of diuretic and he feels that he is breathing a little better.  Seems to be back to functional class II.  His weight is down to 169 pounds, roughly 5 pounds less than before.  Does not have orthopnea.  Denies leg edema.    Repeat download on his defibrillator shows that his thoracic impedance, which have been heading down consistent with fluid overload, is starting to return back to baseline.  He  continues to have mediocre resynchronization pacing due to persistent atrial fibrillation which has been going on now for about 4 months.  He continues to deny angina pectoris, palpitations, syncope or focal neurological complaints.  His most recent echocardiogram in March 2019 showed left ventricular ejection fraction of 30-35% with wall motion abnormalities in the inferior-inferolateral and anteroapical distribution.  Aortic prosthesis gradients were normal and there was no significant mitral regurgitation.  The Doppler parameters were consistent with high filling pressures.  Device interrogation was performed on March 31 and shows that he has been in persistent atrial fibrillation since early December.  Roughly around the same time his biventricular pacing efficiency deteriorated to about 75%.  The OptiVol steadily increased throughout December, January and February and is staying abnormal throughout the month of March.  Activity level remains remarkably good at 3.9 hours/day, but this was 1 or 2 hours less than his average in the past.  Otherwise device function is normal and there has been no evidence of high ventricular rates or device therapy.  The patient does not have symptoms concerning for COVID-19 infection (fever, chills, cough, or new shortness of breath).    Past Medical History:  Diagnosis Date  . Anginal pain (HCC)   . Anxiety   . Aortic stenosis    a. s/p bioprosthetic AVR in 2010  . Arthritis    gout  . CAD (coronary artery disease)    a. s/p CABG in 1992 b. redo CABG in 2004 c. cath in 2010 showing patent LIMA-LAD, free RIMA-OM, and SVG-OM, and 80% stenosis of small  PLA branch  . Chest pain   . CHF (congestive heart failure) (HCC)   . Dysrhythmia   . GERD (gastroesophageal reflux disease)    with gas and bloating probably cause of chest pain  . H/O atrial flutter    a. s/p ablation and not on anticoagulation secondary to GIB and alcohol abuse.   . H/O: gout   .  Hyperlipidemia   . Hypertension   . ICD (implantable cardiac defibrillator) infection, s/p removal of ICD 07/12/2012   re-implanted Medtronic BiV ICD, serial number [id number]   . Ischemic cardiomyopathy    a. EF previously 20-25%, improved to 40-45% by echo in 03/2013 b. 10/2017: echo showing of 30-35% with Grade 2 DD, normal function of AVR, moderate MR.   . Myocardial infarction (HCC)   . 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 Shortness of breath    Past Surgical History:  Procedure Laterality Date  . BI-VENTRICULAR IMPLANTABLE CARDIOVERTER DEFIBRILLATOR Right 08/18/2012   Medtronic BiV ICD, serial number [id number] ; Procedure: BI-VENTRICULAR IMPLANTABLE CARDIOVERTER DEFIBRILLATOR  (CRT-D);  Surgeon: Marinus Maw, MD;  Location: [facility] CATH LAB;  Service: Cardiovascular;  Laterality: Right;  . BIV ICD GENERATOR CHANGEOUT N/A 09/08/2018   Procedure: BIV ICD GENERATOR CHANGEOUT;  Surgeon: Thurmon Fair, MD;  Location: [facility] INVASIVE CV LAB;  Service: Cardiovascular;  Laterality: N/A;  . CARDIAC CATHETERIZATION  Dec 18, 2003   with a presentation of atrial flutter with rapid ventricular response. He had a cath and two stents to his PDA after his SVG to his RCA. His other graft were patent. He had LV with EF of 30%.   . CAROTID ENDARTERECTOMY  11/2002   left in December 2003 and known renal artery stenosis of 60-70%  . CORONARY ARTERY BYPASS GRAFT  1993   redo in 2000, he has had multiple MI previous to both procedures.  . IMPLANTABLE CARDIOVERTER DEFIBRILLATOR (ICD) GENERATOR CHANGE N/A 06/25/2012   Procedure: ICD GENERATOR CHANGE;  Surgeon: Marinus Maw, MD;  Location: [facility] CATH LAB;  Service: Cardiovascular;  Laterality: N/A;  . VALVE REPLACEMENT  2010     Current Meds  Medication Sig  . aspirin EC 81 MG tablet Take 81 mg by mouth daily.   [name] atorvastatin (LIPITOR) 20 MG tablet TAKE 1 TABLET BY MOUTH EVERY DAY  . carvedilol (COREG) 3.125 MG tablet  TAKE 1 TABLET BY MOUTH TWICE A DAY  . colchicine 0.6 MG tablet TAKE 1 TABLET (0.6 MG TOTAL) BY MOUTH AS NEEDED (GOUT ATTACKS).  . ferrous sulfate (IRON SUPPLEMENT) 325 (65 FE) MG tablet Take 1 tablet (325 mg total) by mouth daily with breakfast.  . furosemide (LASIX) 40 MG tablet Take 1 tablet (40 mg total) by mouth 2 (two) times daily.  [name] KLOR-CON M10 10 MEQ tablet TAKE 1 TABLET BY MOUTH EVERY DAY  . nitroGLYCERIN (NITROSTAT) 0.4 MG SL tablet Place 1 tablet (0.4 mg) under the tongue every 5 minutes as needed for chest pain for up to 3 doses/call 9-1-1 and MD if no relief  . vitamin B-12 (CYANOCOBALAMIN) 1000 MCG tablet Take 1 tablet (1,000 mcg total) by mouth daily.     Allergies:   Atorvastatin and Other   Social History   Tobacco Use  . Smoking status: Never Smoker  . Smokeless tobacco: Current User    Types: Chew  Substance Use Topics  . Alcohol use: Yes    Alcohol/week: 14.0 standard drinks  Types: 14 Cans of beer per week  . Drug use: No     Family Hx: The patient's family history includes CAD in his brother.  ROS:   Please see the history of present illness.     All other systems reviewed and are negative.   Prior CV studies:   The following studies were reviewed today:  March 22, 2019 defibrillator download  Labs/Other Tests and Data Reviewed:    EKG:  An ECG dated 09/08/2018 was personally reviewed today and demonstrated:  Atrial sensed, ventricular sensed rhythm with prominent positive R waves in lead V1 consistent with effective left ventricular pacing  Recent Labs: 12/21/2018: ALT 7; Hemoglobin 11.7; Platelets 186 01/20/2019: BUN 25; Creatinine, Ser 1.45; Magnesium 1.8; Potassium 3.9; Sodium 141; TSH 4.220   Recent Lipid Panel Lab Results  Component Value Date/Time   CHOL 158 12/19/2016 08:41 AM   TRIG 74 12/19/2016 08:41 AM   HDL 38 (L) 12/19/2016 08:41 AM   CHOLHDL 4.2 12/19/2016 08:41 AM   LDLCALC 105 (H) 12/19/2016 08:41 AM    Wt Readings from  Last 3 Encounters:  04/22/19 169 lb (76.7 kg)  01/20/19 179 lb (81.2 kg)  12/21/18 182 lb (82.6 kg)     Objective:    Vital Signs:  BP 138/80   Pulse 80   Ht  (1.854 m)   Wt 169 lb (76.7 kg)   BMI 22.30 kg/m    Vital signs reviewed.  Unable to establish video link.  ASSESSMENT & PLAN:    1. CHF: NYHA functional class II.  Device download shows a tendency to hypervolemia which is improving.  We will leave him on the higher dose of diuretics.  He has reduction in CRT efficiency, due to atrial fibrillation.  We will increase his dose of carvedilol to 6.25 mg twice daily and add back amiodarone 200 mg daily, which hopefully will lead to better CRT percentage.. Most recent EF 30-35%.    He has had issues with hypotension on ACE inhibitors in the past.   2. CAD s/p redo CABG:  He remains angina free.  On statin. (cath in 2010 showing patent LIMA-LAD, free RIMA-OM, and SVG-OM, and 80% stenosis of small PLA branch). 3. CRT-D:  Reduced efficiency of biventricular pacing due to atrial fibrillation.  Will use higher dose carvedilol and amiodarone to achieve a lower AV conduction rates. 4. HLP:  On statin.  We will repeat his lipid profile when he comes into the office. 5. HTN:  Well controlled, will tolerate higher doses of beta-blocker from this point of view. 6. Carotid disease s/p bilateral carotid endarterectomies. No recent neurological complaints.  7. AFib:  Initially the atrial fibrillation did not seem to have much impact on his functional capacity, but he is now having worsening heart failure, presumably due to loss of CRT. he remains unaware of the arrhythmia itself.. We will start amiodarone to improve the percentage of CRT, although it may also lead to conversion to sinus rhythm. He is not anticoagulated. Continued alcohol use and poor compliance with medications as well as history of GI bleeding have been disincentives to restarting anticoagulants.  The inability to guarantee  long-term anticoagulation also makes him a poor candidate for cardioversion therapy.  I discussed with him that there is a real risk that he could have a stroke while not on anticoagulation and this risk may increase slightly when we start amiodarone since he may convert to normal rhythm.  Nevertheless, I believe the risks  of anticoagulation and recurrent bleeding or injury outweigh the benefits. 8. S/P AVR: March 2019 echo shows normal bioprosthetic valve function. 9. Ascending aortic aneurysm: Stable size.  5.0 cm by CT angiogram of the chest in June 2018, 5.1 cm in March 2019.  We discussed the fact that we could perform a referral to cardiac surgery, but he is not a great candidate for 3rd thoracotomy. 10. CKD 3:    Will check a basic metabolic panel when he comes into the office 11. Gout:  So far, no recurrence of gout attack after we increased his diuretics.  Not a good candidate for NSAIDs due to GI bleeding, renal insufficiency, heart failure.    If he does get a gout attack, will use a short course of steroids. 12. Fe def anemia: Hemoglobin remained a little low, 11.7  with normocytic indices when last checked on December 31.  Will recheck CBC as well when he comes in. 13. Hypothyroidism: Last TSH checked in January had normalized at 4.2 with a borderline low free T4 of 0.78.  Has been off amiodarone for about 12 months now. 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 Asymptomatic.   COVID-19 Education: The signs and symptoms of COVID-19 were discussed with the patient and how to seek care for testing (follow up with PCP or arrange E-visit).  The importance of social distancing was discussed today.  Time:   Today, I have spent 21 minutes with the patient with telehealth technology discussing the above problems.     Medication Adjustments/Labs and Tests Ordered: Current medicines are reviewed at length  with the patient today.  Concerns regarding medicines are outlined above.   Tests Ordered: No orders of the defined types were placed in this encounter.   Medication Changes: No orders of the defined types were placed in this encounter.   Disposition:  Follow up in person visit on May 14 at [facility].  On that day also plan to check an ECG as well as metabolic panel, BNP, CBC and lipid profile.  Signed, Thurmon Fair, MD  04/22/2019 11:36 AM    [facility]

## 2019-04-22 NOTE — Patient Instructions (Addendum)
Medication Instructions:  Increase Carvedilol to 6.25 mg twice a day Start Amiodarone 200 mg daily Continue Lasix 40 mg twice a day Continue all other medications If you need a refill on your cardiac medications before your next appointment, please call your pharmacy.   Lab work: None ordered   Testing/Procedures: None ordered  Follow-Up: At [facility], you and your health needs are our priority.  As part of our continuing mission to provide you with exceptional heart care, we have created designated Provider Care Teams.  These Care Teams include your primary Cardiologist (physician) and Advanced Practice Providers (APPs -  Physician Assistants and Nurse Practitioners) who all work together to provide you with the care you need, when you need it. Kemper Durie will call and schedule appointment with Dr.Croitoru  5/14

## 2019-05-05 ENCOUNTER — Ambulatory Visit (INDEPENDENT_AMBULATORY_CARE_PROVIDER_SITE_OTHER): Payer: Medicare Other | Admitting: Cardiovascular Disease

## 2019-05-05 ENCOUNTER — Encounter: Payer: Self-pay | Admitting: Cardiovascular Disease

## 2019-05-05 ENCOUNTER — Other Ambulatory Visit: Payer: Self-pay

## 2019-05-05 VITALS — BP 132/70 | HR 70 | Ht 73.0 in | Wt 173.4 lb

## 2019-05-05 DIAGNOSIS — I10 Essential (primary) hypertension: Secondary | ICD-10-CM

## 2019-05-05 DIAGNOSIS — I6523 Occlusion and stenosis of bilateral carotid arteries: Secondary | ICD-10-CM

## 2019-05-05 DIAGNOSIS — Z95810 Presence of automatic (implantable) cardiac defibrillator: Secondary | ICD-10-CM | POA: Diagnosis not present

## 2019-05-05 DIAGNOSIS — Z953 Presence of xenogenic heart valve: Secondary | ICD-10-CM

## 2019-05-05 DIAGNOSIS — I5043 Acute on chronic combined systolic (congestive) and diastolic (congestive) heart failure: Secondary | ICD-10-CM

## 2019-05-05 DIAGNOSIS — I712 Thoracic aortic aneurysm, without rupture: Secondary | ICD-10-CM

## 2019-05-05 DIAGNOSIS — E039 Hypothyroidism, unspecified: Secondary | ICD-10-CM

## 2019-05-05 DIAGNOSIS — I7121 Aneurysm of the ascending aorta, without rupture: Secondary | ICD-10-CM

## 2019-05-05 DIAGNOSIS — N183 Chronic kidney disease, stage 3 unspecified: Secondary | ICD-10-CM

## 2019-05-05 DIAGNOSIS — I4819 Other persistent atrial fibrillation: Secondary | ICD-10-CM

## 2019-05-05 DIAGNOSIS — E7800 Pure hypercholesterolemia, unspecified: Secondary | ICD-10-CM

## 2019-05-05 DIAGNOSIS — D500 Iron deficiency anemia secondary to blood loss (chronic): Secondary | ICD-10-CM

## 2019-05-05 DIAGNOSIS — I25810 Atherosclerosis of coronary artery bypass graft(s) without angina pectoris: Secondary | ICD-10-CM | POA: Diagnosis not present

## 2019-05-05 DIAGNOSIS — M1A20X0 Drug-induced chronic gout, unspecified site, without tophus (tophi): Secondary | ICD-10-CM

## 2019-05-05 NOTE — H&P (View-Only) (Signed)
Cardiology Office Note:    Date:  05/05/2019   ID:  [name], DOB 11-21-1941, MRN [id number]  PCP:  Patient, No Pcp Per  Cardiologist:  Thurmon Fair, MD  Electrophysiologist:  None   Referring MD: No ref. provider found   chief complaint: Dyspnea  History of Present Illness:    [name] is a 78 y.o. male with a hx of coronary artery disease with redo bypass surgery 2004, aortic valve replacement with biological prosthesis, severe ischemic cardiomyopathy with combined systolic and diastolic heart failure, CRT-D, bilateral carotid stenosis with bilateral endarterectomy in the remote past, now with a protracted episode of acute heart failure exacerbation due to persistent atrial fibrillation that has been going on for the last 3 months.  After an increase in diuretics his breathing has improved but he maintains NYHA functional class II-3 status.  He has only trivial ankle swelling.  He denies orthopnea or PND but activity is limited by shortness of breath.  He has not had angina pectoris, dizziness, palpitations or syncope.  Device interrogation shows persistent atrial fibrillation for the last 5-6 months.  Ventricular rate control is good, but biventricular pacing deficiency has been reduced to about 75%.  He appeared to tolerate this well for several months, although his OptiVol has been steadily rising throughout the winter.  Parallel there was a notable gradual reduction in has activity level.  After increasing diuretics in April his OptiVol has returned to baseline, but he still only has 75% biventricular pacing and symptoms persist.  His weight on our office scale and on his home scale today coincided 173 pounds.  He reports avoiding all sodium rich foods and uses salt substitute when cooking.  He drinks 4-5 beers a day.  He denies any recent falls, injuries or bleeding problems.  We had been avoiding chronic anticoagulation due to issues related to alcohol use, GI  bleeding and noncompliance.  He does not have a history of stroke/TIA or other embolic events.  His most recent echocardiogram in March 2019 showed left ventricular ejection fraction of 30-35% with wall motion abnormalities in the inferior-inferolateral and anteroapical distribution.  Aortic prosthesis gradients were normal and there was no significant mitral regurgitation.  The Doppler parameters were consistent with high filling pressures.  The patient does not have symptoms concerning for COVID-19 infection (fever, chills, cough, or Unexplained shortness of breath).   Past Medical History:  Diagnosis Date  . Anginal pain (HCC)   . Anxiety   . Aortic stenosis    a. s/p bioprosthetic AVR in 2010  . Arthritis    gout  . CAD (coronary artery disease)    a. s/p CABG in 1992 b. redo CABG in 2004 c. cath in 2010 showing patent LIMA-LAD, free RIMA-OM, and SVG-OM, and 80% stenosis of small PLA branch  . Chest pain   . CHF (congestive heart failure) (HCC)   . Dysrhythmia   . GERD (gastroesophageal reflux disease)    with gas and bloating probably cause of chest pain  . H/O atrial flutter    a. s/p ablation and not on anticoagulation secondary to GIB and alcohol abuse.   . H/O: gout   . Hyperlipidemia   . Hypertension   . ICD (implantable cardiac defibrillator) infection, s/p removal of ICD 07/12/2012   re-implanted Medtronic BiV ICD, serial number [id number]   . Ischemic cardiomyopathy    a. EF previously 20-25%, improved to 40-45% by echo in 03/2013 b. 10/2017: echo showing of 30-35%  with Grade 2 DD, normal function of AVR, moderate MR.   . Myocardial infarction (HCC)   . 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 Shortness of breath     Past Surgical History:  Procedure Laterality Date  . BI-VENTRICULAR IMPLANTABLE CARDIOVERTER DEFIBRILLATOR Right 08/18/2012   Medtronic BiV ICD, serial number [id number] ; Procedure: BI-VENTRICULAR IMPLANTABLE  CARDIOVERTER DEFIBRILLATOR  (CRT-D);  Surgeon: Marinus Maw, MD;  Location: [facility] CATH LAB;  Service: Cardiovascular;  Laterality: Right;  . BIV ICD GENERATOR CHANGEOUT N/A 09/08/2018   Procedure: BIV ICD GENERATOR CHANGEOUT;  Surgeon: Thurmon Fair, MD;  Location: [facility] INVASIVE CV LAB;  Service: Cardiovascular;  Laterality: N/A;  . CARDIAC CATHETERIZATION  Dec 18, 2003   with a presentation of atrial flutter with rapid ventricular response. He had a cath and two stents to his PDA after his SVG to his RCA. His other graft were patent. He had LV with EF of 30%.   . CAROTID ENDARTERECTOMY  11/2002   left in December 2003 and known renal artery stenosis of 60-70%  . CORONARY ARTERY BYPASS GRAFT  1993   redo in 2000, he has had multiple MI previous to both procedures.  . IMPLANTABLE CARDIOVERTER DEFIBRILLATOR (ICD) GENERATOR CHANGE N/A 06/25/2012   Procedure: ICD GENERATOR CHANGE;  Surgeon: Marinus Maw, MD;  Location: [facility] CATH LAB;  Service: Cardiovascular;  Laterality: N/A;  . VALVE REPLACEMENT  2010    Current Medications: Current Meds  Medication Sig  . amiodarone (PACERONE) 200 MG tablet Take 1 tablet (200 mg total) by mouth daily.  [name] atorvastatin (LIPITOR) 20 MG tablet TAKE 1 TABLET BY MOUTH EVERY DAY  . carvedilol (COREG) 6.25 MG tablet Take 1 tablet (6.25 mg total) by mouth 2 (two) times daily.  . colchicine 0.6 MG tablet TAKE 1 TABLET (0.6 MG TOTAL) BY MOUTH AS NEEDED (GOUT ATTACKS).  . ferrous sulfate (IRON SUPPLEMENT) 325 (65 FE) MG tablet Take 1 tablet (325 mg total) by mouth daily with breakfast.  . furosemide (LASIX) 40 MG tablet Take 1 tablet (40 mg total) by mouth 2 (two) times daily.  [name] KLOR-CON M10 10 MEQ tablet TAKE 1 TABLET BY MOUTH EVERY DAY  . nitroGLYCERIN (NITROSTAT) 0.4 MG SL tablet Place 1 tablet (0.4 mg) under the tongue every 5 minutes as needed for chest pain for up to 3 doses/call 9-1-1 and MD if no relief  . vitamin B-12 (CYANOCOBALAMIN) 1000 MCG tablet Take 1 tablet  (1,000 mcg total) by mouth daily.  . [DISCONTINUED] aspirin EC 81 MG tablet Take 81 mg by mouth daily.      Allergies:   Atorvastatin and Other   Social History   Socioeconomic History  . Marital status: Married    Spouse name: Not on file  . Number of children: Not on file  . Years of education: Not on file  . Highest education level: Not on file  Occupational History  . Not on file  Social Needs  . Financial resource strain: Not on file  . Food insecurity:    Worry: Not on file    Inability: Not on file  . Transportation needs:    Medical: Not on file    Non-medical: Not on file  Tobacco Use  . Smoking status: Never Smoker  . Smokeless tobacco: Current User    Types: Chew  Substance and Sexual Activity  . Alcohol use: Yes    Alcohol/week: 14.0 standard drinks  Types: 14 Cans of beer per week  . Drug use: No  . Sexual activity: Not on file  Lifestyle  . Physical activity:    Days per week: Not on file    Minutes per session: Not on file  . Stress: Not on file  Relationships  . Social connections:    Talks on phone: Not on file    Gets together: Not on file    Attends religious service: Not on file    Active member of club or organization: Not on file    Attends meetings of clubs or organizations: Not on file    Relationship status: Not on file  Other Topics Concern  . Not on file  Social History Narrative  . Not on file     Family History: The patient's family history includes CAD in his brother.  ROS:   Please see the history of present illness.   He described intermittent problems with diplopia, but no other focal neurological complaints.  All other systems reviewed and are negative.  EKGs/Labs/Other Studies Reviewed:    The following studies were reviewed today: Comprehensive pacemaker download today, most recent echocardiogram  EKG:  EKG is  ordered today.  The ekg ordered today demonstrates atrial fibrillation, 100% biventricular paced rhythm  with prominent positive R waves in leads V1-V2  Recent Labs: 12/21/2018: ALT 7; Hemoglobin 11.7; Platelets 186 01/20/2019: BUN 25; Creatinine, Ser 1.45; Magnesium 1.8; Potassium 3.9; Sodium 141; TSH 4.220  Recent Lipid Panel    Component Value Date/Time   CHOL 158 12/19/2016 0841   TRIG 74 12/19/2016 0841   HDL 38 (L) 12/19/2016 0841   CHOLHDL 4.2 12/19/2016 0841   VLDL 15 12/19/2016 0841   LDLCALC 105 (H) 12/19/2016 0841    Physical Exam:    VS:  BP 132/70   Pulse 70   Ht 6\' 1"  (1.854 m)   Wt 173 lb 6.4 oz (78.7 kg)   BMI 22.88 kg/m     Wt Readings from Last 3 Encounters:  05/05/19 173 lb 6.4 oz (78.7 kg)  04/22/19 169 lb (76.7 kg)  01/20/19 179 lb (81.2 kg)     GEN: Appears a little elderly and frail, but comfortable and in good spirits; well nourished, well developed in no acute distress HEENT: Normal NECK: No JVD; No carotid bruits LYMPHATICS: No lymphadenopathy CARDIAC: Paradoxically split second heart sound, laterally displaced apical impulse, RRR, faint aortic ejection murmur no diastolic murmurs, rubs, gallops RESPIRATORY:  Clear to auscultation without rales, wheezing or rhonchi  ABDOMEN: Soft, non-tender, non-distended MUSCULOSKELETAL: Trivial symmetrical ankle edema; No deformity  SKIN: Warm and dry NEUROLOGIC:  Alert and oriented x 3 PSYCHIATRIC:  Normal affect   ASSESSMENT:    1. Acute on chronic combined systolic and diastolic CHF (congestive heart failure) (HCC)   2. Other persistent atrial fibrillation   3. CAD of autologous vein bypass graft without angina   4. 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 Hypercholesterolemia   6. Essential hypertension   7. Bilateral carotid artery stenosis   8. History of aortic valve replacement with bioprosthetic valve   9. Ascending aortic aneurysm (HCC)   10. 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 Iron deficiency anemia due to chronic blood loss   13. Acquired hypothyroidism    PLAN:    In order of problems listed above:  1. CHF:  NYHA functional class II.    Although his thoracic impedance has returned to baseline and stable values, he continues to have impaired exercise tolerance.  Has only 75% biventricular pacing due to the atrial fibrillation despite good ventricular rate control..    Started back on amiodarone.  He will require cardioversion for improvement, but first have to start on anticoagulation.  Need several more weeks of amiodarone "loading".Most recent EF 30-35%.   He has had issues with hypotension on ACE inhibitors in the past.   2. AFib: Initially the atrial fibrillation did not seem to have much impact on his functional capacity, but he is now having worsening heart failure, presumably due to loss of CRT.   Restarted him on Eliquis 5 mg twice daily today.  In the past he was not felt to be a good candidate for long-term anticoagulation and we may decide to use the anticoagulant just for a month before and a month after the cardioversion.  Continued alcohol use and poor compliance with medications as well as history of GI bleeding have been disincentives to restarting anticoagulants.   3. CAD s/p redo CABG:  Angina free, taking statin.  We will stop his aspirin when we start Eliquis. (cath in 2010 showing patent LIMA-LAD, free RIMA-OM, and SVG-OM, and 80% stenosis of small PLA branch). 4. CRT-D: Reduced efficiency of biventricular pacing due to atrial fibrillation.    This is still an issue despite good ventricular rate control on carvedilol and low-dose amiodarone.   5. HLP: On statin.  We will repeat his lipid profile today. 6. HTN:  Fair control.  In the past his blood pressure limited the use of higher doses of carvedilol, but this does not appear to be an issue right now. 7. Carotid diseases/p bilateral carotid endarterectomies. No recent neurological  complaints. sMost recent duplex ultrasound was performed in 2014.  Consider reevaluation when our vascular lab opens up for full activity.  I do not think the intermittent, repetitive and stereotypical diplopia that he describes represents a TIA.  It definitely does not sound like an embolic event from atrial fibrillation, but worthwhile making sure there is no significant vascular problem. 8. S/P AVR:March 2019 echo shows normal bioprosthetic valve function. 9. Ascending aortic aneurysm:Stable size. 5.0 cm by CT angiogram of the chest in June 2018, 5.1 cm in March 2019. He is not a candidate for 3rd thoracotomy. 10. CKD 3:   Recheck labs today. 11. Gout: So far, no recurrence of gout attack after we increased his diuretics. Not a good candidate for NSAIDs due to GI bleeding, renal insufficiency, heart failure.   If he does get a gout attack, will use a short course of steroids. 12. Fe def anemia:Hemoglobin remained a little low, 11.7  with normocytic indices when last checked on December 31.    Recheck today. 13. Hypothyroidism:Last TSH checked in January had normalized at 4.2 with a borderline low free T4 of 0.78. Has been off amiodarone for about 12 months now. 14. 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 Asymptomatic.  We will bring him back for elective direct-current cardioversion after 3 weeks of oral anticoagulation. This procedure has been fully reviewed with the patient and written informed consent has been obtained.   Medication Adjustments/Labs and Tests Ordered: Current medicines are reviewed at length with the patient today.  Concerns regarding medicines are outlined above.  Orders Placed This Encounter  Procedures  . Lipid panel  .  Comprehensive Metabolic Panel (CMET)  . CBC w/Diff/Platelet  . EKG 12-Lead   No orders of the defined types were placed in this encounter.   Patient  Instructions   You are scheduled for a Cardioversion on Monday 05/30/19 with Dr.Lorrie Strauch.  [facility]: [address] [place], [place] [id number] at 7:00 am. (1 hour prior to procedure unless lab work is needed; if lab work is needed arrive 1.5 hours ahead)  DIET: Nothing to eat or drink after midnight except a sip of water with medications (see medication instructions below)  Medication Instructions:  Continue your anticoagulant: Eliquis You will need to continue your anticoagulant after your procedure until you are told by your  Provider that it is safe to stop   Labs:  Today ( Lipid panel,cmet,cbc )   Covid test to be done Wednesday 05/25/19 at 9:00am [facility]   You must have a responsible person to drive you home and stay in the waiting area during your procedure. Failure to do so could result in cancellation.  Bring your insurance cards.  *Special Note: Every effort is made to have your procedure done on time. Occasionally there are emergencies that occur at the hospital that may cause delays. Please be patient if a delay does occur.      Signed, Thurmon FairMihai Lilja Soland, MD  05/05/2019 5:28 PM    [facility]

## 2019-05-05 NOTE — Patient Instructions (Signed)
  You are scheduled for a Cardioversion on Monday 05/30/19 with Dr.Croitoru.  Please arrive at the [facility] (Main Entrance A) at [facility]: [address] [place], [place] [address] at 7:00 am. (1 hour prior to procedure unless lab work is needed; if lab work is needed arrive 1.5 hours ahead)  DIET: Nothing to eat or drink after midnight except a sip of water with medications (see medication instructions below)  Medication Instructions:  Continue your anticoagulant: Eliquis You will need to continue your anticoagulant after your procedure until you are told by your  Provider that it is safe to stop   Labs:  Today ( Lipid panel,cmet,cbc )   Covid test to be done Wednesday 05/25/19 at 9:00am at [facility] must have a responsible person to drive you home and stay in the waiting area during your procedure. Failure to do so could result in cancellation.  Bring your insurance cards.  *Special Note: Every effort is made to have your procedure done on time. Occasionally there are emergencies that occur at the hospital that may cause delays. Please be patient if a delay does occur.

## 2019-05-05 NOTE — Progress Notes (Signed)
Cardiology Office Note:    Date:  05/05/2019   ID:  [name], DOB 06/28/1941, MRN [id number]  PCP:  Patient, No Pcp Per  Cardiologist:  Charliene Inoue, MD  Electrophysiologist:  None   Referring MD: No ref. provider found   chief complaint: Dyspnea  History of Present Illness:    [name] is a 78 y.o. male with a hx of coronary artery disease with redo bypass surgery 2004, aortic valve replacement with biological prosthesis, severe ischemic cardiomyopathy with combined systolic and diastolic heart failure, CRT-D, bilateral carotid stenosis with bilateral endarterectomy in the remote past, now with a protracted episode of acute heart failure exacerbation due to persistent atrial fibrillation that has been going on for the last 3 months.  After an increase in diuretics his breathing has improved but he maintains NYHA functional class II-3 status.  He has only trivial ankle swelling.  He denies orthopnea or PND but activity is limited by shortness of breath.  He has not had angina pectoris, dizziness, palpitations or syncope.  Device interrogation shows persistent atrial fibrillation for the last 5-6 months.  Ventricular rate control is good, but biventricular pacing deficiency has been reduced to about 75%.  He appeared to tolerate this well for several months, although his OptiVol has been steadily rising throughout the winter.  Parallel there was a notable gradual reduction in has activity level.  After increasing diuretics in April his OptiVol has returned to baseline, but he still only has 75% biventricular pacing and symptoms persist.  His weight on our office scale and on his home scale today coincided 173 pounds.  He reports avoiding all sodium rich foods and uses salt substitute when cooking.  He drinks 4-5 beers a day.  He denies any recent falls, injuries or bleeding problems.  We had been avoiding chronic anticoagulation due to issues related to alcohol use, GI  bleeding and noncompliance.  He does not have a history of stroke/TIA or other embolic events.  His most recent echocardiogram in March 2019 showed left ventricular ejection fraction of 30-35% with wall motion abnormalities in the inferior-inferolateral and anteroapical distribution.  Aortic prosthesis gradients were normal and there was no significant mitral regurgitation.  The Doppler parameters were consistent with high filling pressures.  The patient does not have symptoms concerning for COVID-19 infection (fever, chills, cough, or Unexplained shortness of breath).   Past Medical History:  Diagnosis Date  . Anginal pain (HCC)   . Anxiety   . Aortic stenosis    a. s/p bioprosthetic AVR in 2010  . Arthritis    gout  . CAD (coronary artery disease)    a. s/p CABG in 1992 b. redo CABG in 2004 c. cath in 2010 showing patent LIMA-LAD, free RIMA-OM, and SVG-OM, and 80% stenosis of small PLA branch  . Chest pain   . CHF (congestive heart failure) (HCC)   . Dysrhythmia   . GERD (gastroesophageal reflux disease)    with gas and bloating probably cause of chest pain  . H/O atrial flutter    a. s/p ablation and not on anticoagulation secondary to GIB and alcohol abuse.   . H/O: gout   . Hyperlipidemia   . Hypertension   . ICD (implantable cardiac defibrillator) infection, s/p removal of ICD 07/12/2012   re-implanted Medtronic BiV ICD, serial number [id number]   . Ischemic cardiomyopathy    a. EF previously 20-25%, improved to 40-45% by echo in 03/2013 b. 10/2017: echo showing of 30-35%   with Grade 2 DD, normal function of AVR, moderate MR.   . Myocardial infarction (HCC)   . 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 Shortness of breath     Past Surgical History:  Procedure Laterality Date  . BI-VENTRICULAR IMPLANTABLE CARDIOVERTER DEFIBRILLATOR Right 08/18/2012   Medtronic BiV ICD, serial number [id number] ; Procedure: BI-VENTRICULAR IMPLANTABLE  CARDIOVERTER DEFIBRILLATOR  (CRT-D);  Surgeon: Gregg W Taylor, MD;  Location: [facility] CATH [facility];  Service: Cardiovascular;  Laterality: Right;  . BIV ICD GENERATOR CHANGEOUT N/A 09/08/2018   Procedure: BIV ICD GENERATOR CHANGEOUT;  Surgeon: Elisandra Deshmukh, MD;  Location: [facility] INVASIVE CV LAB;  Service: Cardiovascular;  Laterality: N/A;  . CARDIAC CATHETERIZATION  Dec 18, 2003   with a presentation of atrial flutter with rapid ventricular response. He had a cath and two stents to his PDA after his SVG to his RCA. His other graft were patent. He had LV with EF of 30%.   . CAROTID ENDARTERECTOMY  11/2002   left in December 2003 and known renal artery stenosis of 60-70%  . CORONARY ARTERY BYPASS GRAFT  1993   redo in 2000, he has had multiple MI previous to both procedures.  . IMPLANTABLE CARDIOVERTER DEFIBRILLATOR (ICD) GENERATOR CHANGE N/A 06/25/2012   Procedure: ICD GENERATOR CHANGE;  Surgeon: Gregg W Taylor, MD;  Location: [facility] CATH [facility];  Service: Cardiovascular;  Laterality: N/A;  . VALVE REPLACEMENT  2010    Current Medications: Current Meds  Medication Sig  . amiodarone (PACERONE) 200 MG tablet Take 1 tablet (200 mg total) by mouth daily.  . atorvastatin (LIPITOR) 20 MG tablet TAKE 1 TABLET BY MOUTH EVERY DAY  . carvedilol (COREG) 6.25 MG tablet Take 1 tablet (6.25 mg total) by mouth 2 (two) times daily.  . colchicine 0.6 MG tablet TAKE 1 TABLET (0.6 MG TOTAL) BY MOUTH AS NEEDED (GOUT ATTACKS).  . ferrous sulfate (IRON SUPPLEMENT) 325 (65 FE) MG tablet Take 1 tablet (325 mg total) by mouth daily with breakfast.  . furosemide (LASIX) 40 MG tablet Take 1 tablet (40 mg total) by mouth 2 (two) times daily.  . KLOR-CON M10 10 MEQ tablet TAKE 1 TABLET BY MOUTH EVERY DAY  . nitroGLYCERIN (NITROSTAT) 0.4 MG SL tablet Place 1 tablet (0.4 mg) under the tongue every 5 minutes as needed for chest pain for up to 3 doses/call 9-1-1 and MD if no relief  . vitamin B-12 (CYANOCOBALAMIN) 1000 MCG tablet Take 1 tablet  (1,000 mcg total) by mouth daily.  . [DISCONTINUED] aspirin EC 81 MG tablet Take 81 mg by mouth daily.      Allergies:   Atorvastatin and Other   Social History   Socioeconomic History  . Marital status: Married    Spouse name: Not on file  . Number of children: Not on file  . Years of education: Not on file  . Highest education level: Not on file  Occupational History  . Not on file  Social Needs  . Financial resource strain: Not on file  . Food insecurity:    Worry: Not on file    Inability: Not on file  . Transportation needs:    Medical: Not on file    Non-medical: Not on file  Tobacco Use  . Smoking status: Never Smoker  . Smokeless tobacco: Current User    Types: Chew  Substance and Sexual Activity  . Alcohol use: Yes    Alcohol/week: 14.0 standard drinks      Types: 14 Cans of beer per week  . Drug use: No  . Sexual activity: Not on file  Lifestyle  . Physical activity:    Days per week: Not on file    Minutes per session: Not on file  . Stress: Not on file  Relationships  . Social connections:    Talks on phone: Not on file    Gets together: Not on file    Attends religious service: Not on file    Active member of club or organization: Not on file    Attends meetings of clubs or organizations: Not on file    Relationship status: Not on file  Other Topics Concern  . Not on file  Social History Narrative  . Not on file     Family History: The patient's family history includes CAD in his brother.  ROS:   Please see the history of present illness.   He described intermittent problems with diplopia, but no other focal neurological complaints.  All other systems reviewed and are negative.  EKGs/Labs/Other Studies Reviewed:    The following studies were reviewed today: Comprehensive pacemaker download today, most recent echocardiogram  EKG:  EKG is  ordered today.  The ekg ordered today demonstrates atrial fibrillation, 100% biventricular paced rhythm  with prominent positive R waves in leads V1-V2  Recent Labs: 12/21/2018: ALT 7; Hemoglobin 11.7; Platelets 186 01/20/2019: BUN 25; Creatinine, Ser 1.45; Magnesium 1.8; Potassium 3.9; Sodium 141; TSH 4.220  Recent Lipid Panel    Component Value Date/Time   CHOL 158 12/19/2016 0841   TRIG 74 12/19/2016 0841   HDL 38 (L) 12/19/2016 0841   CHOLHDL 4.2 12/19/2016 0841   VLDL 15 12/19/2016 0841   LDLCALC 105 (H) 12/19/2016 0841    Physical Exam:    VS:  BP 132/70   Pulse 70   Ht 6' 1" (1.854 m)   Wt 173 lb 6.4 oz (78.7 kg)   BMI 22.88 kg/m     Wt Readings from Last 3 Encounters:  05/05/19 173 lb 6.4 oz (78.7 kg)  04/22/19 169 lb (76.7 kg)  01/20/19 179 lb (81.2 kg)     GEN: Appears a little elderly and frail, but comfortable and in good spirits; well nourished, well developed in no acute distress HEENT: Normal NECK: No JVD; No carotid bruits LYMPHATICS: No lymphadenopathy CARDIAC: Paradoxically split second heart sound, laterally displaced apical impulse, RRR, faint aortic ejection murmur no diastolic murmurs, rubs, gallops RESPIRATORY:  Clear to auscultation without rales, wheezing or rhonchi  ABDOMEN: Soft, non-tender, non-distended MUSCULOSKELETAL: Trivial symmetrical ankle edema; No deformity  SKIN: Warm and dry NEUROLOGIC:  Alert and oriented x 3 PSYCHIATRIC:  Normal affect   ASSESSMENT:    1. Acute on chronic combined systolic and diastolic CHF (congestive heart failure) (HCC)   2. Other persistent atrial fibrillation   3. CAD of autologous vein bypass graft without angina   4. 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 Hypercholesterolemia   6. Essential hypertension   7. Bilateral carotid artery stenosis   8. History of aortic valve replacement with bioprosthetic valve   9. Ascending aortic aneurysm (HCC)   10. 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 Iron deficiency anemia due to chronic blood loss   13. Acquired hypothyroidism    PLAN:    In order of problems listed above:  1. CHF:   NYHA functional class II.    Although his thoracic impedance has returned to baseline and stable values, he continues to have impaired exercise tolerance.  Has only 75% biventricular pacing due to the atrial fibrillation despite good ventricular rate control..    Started back on amiodarone.  He will require cardioversion for improvement, but first have to start on anticoagulation.  Need several more weeks of amiodarone "loading".Most recent EF 30-35%.   He has had issues with hypotension on ACE inhibitors in the past.   2. AFib: Initially the atrial fibrillation did not seem to have much impact on his functional capacity, but he is now having worsening heart failure, presumably due to loss of CRT.   Restarted him on Eliquis 5 mg twice daily today.  In the past he was not felt to be a good candidate for long-term anticoagulation and we may decide to use the anticoagulant just for a month before and a month after the cardioversion.  Continued alcohol use and poor compliance with medications as well as history of GI bleeding have been disincentives to restarting anticoagulants.   3. CAD s/p redo CABG:  Angina free, taking statin.  We will stop his aspirin when we start Eliquis. (cath in 2010 showing patent LIMA-LAD, free RIMA-OM, and SVG-OM, and 80% stenosis of small PLA branch). 4. CRT-D: Reduced efficiency of biventricular pacing due to atrial fibrillation.    This is still an issue despite good ventricular rate control on carvedilol and low-dose amiodarone.   5. HLP: On statin.  We will repeat his lipid profile today. 6. HTN:  Fair control.  In the past his blood pressure limited the use of higher doses of carvedilol, but this does not appear to be an issue right now. 7. Carotid diseases/p bilateral carotid endarterectomies. No recent neurological  complaints. sMost recent duplex ultrasound was performed in 2014.  Consider reevaluation when our vascular lab opens up for full activity.  I do not think the intermittent, repetitive and stereotypical diplopia that he describes represents a TIA.  It definitely does not sound like an embolic event from atrial fibrillation, but worthwhile making sure there is no significant vascular problem. 8. S/P AVR:March 2019 echo shows normal bioprosthetic valve function. 9. Ascending aortic aneurysm:Stable size. 5.0 cm by CT angiogram of the chest in June 2018, 5.1 cm in March 2019. He is not a candidate for 3rd thoracotomy. 10. CKD 3:   Recheck labs today. 11. Gout: So far, no recurrence of gout attack after we increased his diuretics. Not a good candidate for NSAIDs due to GI bleeding, renal insufficiency, heart failure.   If he does get a gout attack, will use a short course of steroids. 12. Fe def anemia:Hemoglobin remained a little low, 11.7  with normocytic indices when last checked on December 31.    Recheck today. 13. Hypothyroidism:Last TSH checked in January had normalized at 4.2 with a borderline low free T4 of 0.78. Has been off amiodarone for about 12 months now. 14. 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 Asymptomatic.  We will bring him back for elective direct-current cardioversion after 3 weeks of oral anticoagulation. This procedure has been fully reviewed with the patient and written informed consent has been obtained.   Medication Adjustments/Labs and Tests Ordered: Current medicines are reviewed at length with the patient today.  Concerns regarding medicines are outlined above.  Orders Placed This Encounter  Procedures  . Lipid panel  .   Comprehensive Metabolic Panel (CMET)  . CBC w/Diff/Platelet  . EKG 12-Lead   No orders of the defined types were placed in this encounter.   Patient  Instructions   You are scheduled for a Cardioversion on Monday 05/30/19 with Dr.Calen Posch.  Please arrive at the [facility] (Main Entrance A) at [facility]: [address] [place], [place] [address] at 7:00 am. (1 hour prior to procedure unless lab work is needed; if lab work is needed arrive 1.5 hours ahead)  DIET: Nothing to eat or drink after midnight except a sip of water with medications (see medication instructions below)  Medication Instructions:  Continue your anticoagulant: Eliquis You will need to continue your anticoagulant after your procedure until you are told by your  Provider that it is safe to stop   Labs:  Today ( Lipid panel,cmet,cbc )   Covid test to be done Wednesday 05/25/19 at 9:00am at [facility] [address]   You must have a responsible person to drive you home and stay in the waiting area during your procedure. Failure to do so could result in cancellation.  Bring your insurance cards.  *Special Note: Every effort is made to have your procedure done on time. Occasionally there are emergencies that occur at the hospital that may cause delays. Please be patient if a delay does occur.      Signed, Narely Nobles, MD  05/05/2019 5:28 PM    [facility]  

## 2019-05-06 ENCOUNTER — Telehealth: Payer: Self-pay | Admitting: *Deleted

## 2019-05-06 LAB — CBC WITH DIFFERENTIAL/PLATELET
Basophils Absolute: 0 10*3/uL (ref 0.0–0.2)
Basos: 1 %
EOS (ABSOLUTE): 0.1 10*3/uL (ref 0.0–0.4)
Eos: 2 %
Hematocrit: 35 % — ABNORMAL LOW (ref 37.5–51.0)
Hemoglobin: 12.1 g/dL — ABNORMAL LOW (ref 13.0–17.7)
Immature Grans (Abs): 0 10*3/uL (ref 0.0–0.1)
Immature Granulocytes: 0 %
Lymphocytes Absolute: 0.8 10*3/uL (ref 0.7–3.1)
Lymphs: 16 %
MCH: 30.9 pg (ref 26.6–33.0)
MCHC: 34.6 g/dL (ref 31.5–35.7)
MCV: 90 fL (ref 79–97)
Monocytes Absolute: 0.5 10*3/uL (ref 0.1–0.9)
Monocytes: 9 %
Neutrophils Absolute: 3.7 10*3/uL (ref 1.4–7.0)
Neutrophils: 72 %
Platelets: 129 10*3/uL — ABNORMAL LOW (ref 150–450)
RBC: 3.91 x10E6/uL — ABNORMAL LOW (ref 4.14–5.80)
RDW: 14.2 % (ref 11.6–15.4)
WBC: 5.2 10*3/uL (ref 3.4–10.8)

## 2019-05-06 LAB — LIPID PANEL
Chol/HDL Ratio: 1.9 ratio (ref 0.0–5.0)
Cholesterol, Total: 87 mg/dL — ABNORMAL LOW (ref 100–199)
HDL: 46 mg/dL (ref >39)
LDL Calculated: 32 mg/dL (ref 0–99)
Triglycerides: 44 mg/dL (ref 0–149)
VLDL Cholesterol Cal: 9 mg/dL (ref 5–40)

## 2019-05-06 LAB — COMPREHENSIVE METABOLIC PANEL
ALT: 10 IU/L (ref 0–44)
AST: 18 IU/L (ref 0–40)
Albumin/Globulin Ratio: 1.5 (ref 1.2–2.2)
Albumin: 4.1 g/dL (ref 3.7–4.7)
Alkaline Phosphatase: 96 IU/L (ref 39–117)
BUN/Creatinine Ratio: 20 (ref 10–24)
BUN: 26 mg/dL (ref 8–27)
Bilirubin Total: 1.1 mg/dL (ref 0.0–1.2)
CO2: 23 mmol/L (ref 20–29)
Calcium: 9.1 mg/dL (ref 8.6–10.2)
Chloride: 100 mmol/L (ref 96–106)
Creatinine, Ser: 1.32 mg/dL — ABNORMAL HIGH (ref 0.76–1.27)
GFR calc Af Amer: 60 mL/min/{1.73_m2} (ref >59)
GFR calc non Af Amer: 52 mL/min/{1.73_m2} — ABNORMAL LOW (ref >59)
Globulin, Total: 2.7 g/dL (ref 1.5–4.5)
Glucose: 185 mg/dL — ABNORMAL HIGH (ref 65–99)
Potassium: 3.6 mmol/L (ref 3.5–5.2)
Sodium: 144 mmol/L (ref 134–144)
Total Protein: 6.8 g/dL (ref 6.0–8.5)

## 2019-05-06 NOTE — Telephone Encounter (Signed)
-----   Message from Thurmon Fair, MD sent at 05/06/2019  9:55 AM EDT ----- Good results. Kidney function is mildly abnormal, but at his baseline (same for mild anemia). Electrolytes are ok. Cholesterol is beyond excellent. Please reduce the atorvastatin to 10 mg daily (especially since we started amiodarone).

## 2019-05-06 NOTE — Telephone Encounter (Signed)
Left a message for the patient to call back.  

## 2019-05-10 NOTE — Telephone Encounter (Signed)
Left a message for the patient to call back.  

## 2019-05-11 MED ORDER — ATORVASTATIN CALCIUM 10 MG PO TABS: 10.0000 mg | ORAL_TABLET | Freq: Every day | ORAL | 0 refills | Status: DC

## 2019-05-11 NOTE — Telephone Encounter (Signed)
New message   Patient is retuning your call. Please call.

## 2019-05-11 NOTE — Telephone Encounter (Signed)
Patient made aware of results and verbalized understanding.  Atorvastatin 10 mg daily has been sent into [facility] for him.

## 2019-05-11 NOTE — Telephone Encounter (Signed)
Left a message for the patient to call back.  

## 2019-05-20 ENCOUNTER — Telehealth: Payer: Self-pay | Admitting: Cardiovascular Disease

## 2019-05-20 NOTE — Telephone Encounter (Signed)
returned call to pharmacy they were calling for clarification on the Lipitor dosing. Per lab results: Notes recorded by Thurmon Fair, MD on 05/06/2019 at 9:55 AM EDT Good results.  Kidney function is mildly abnormal, but at his baseline (same for mild anemia). Electrolytes are ok.  Cholesterol is beyond excellent. Please reduce the atorvastatin to 10 mg daily (especially since we started amiodarone). Relayed new Lipitor dosing to pharmacist she will fill the 10mg . I will CB the pt and update him with the lab results. Tried to CB pt, his cell does not have VM, I left a detailed message on the home number to [facility] for pharmacy message and lab results.

## 2019-05-20 NOTE — Telephone Encounter (Signed)
[facility] in [place] calling  States they need a medication clarification for the atrovastatin (LIPITOR)  Please call [phone] to discuss

## 2019-05-21 ENCOUNTER — Telehealth: Payer: Self-pay | Admitting: Physician Assistant

## 2019-05-21 DIAGNOSIS — I5043 Acute on chronic combined systolic (congestive) and diastolic (congestive) heart failure: Secondary | ICD-10-CM

## 2019-05-21 NOTE — Telephone Encounter (Signed)
PT daughter calls - she is a Therapist, music.  169-173 lbs is dry weight 180 lbs this morning 2+ pitting edema to legs and feet bilateral Belly swollen Not urinating like he normally does Extra lasix yesterday has not resolved any symptoms Denies SOB but DOE when he weighed himself She does not suspect symptoms of UTI.  Wife was cooking bacon this morning, but patient denied eating salt.  DCCV scheduled on 05/30/19.  I advised to take an extra lasix today and tomorrow with plans for BMP on Monday. I want to be careful given his underlying kidney disease. He denies rapid heart beat, dizziness, and feelings of pre-syncope.  I will send a message to Dr. Royann Shivers. He is supposed to come for COVID testing on Wed - may need to be seen in clinic on Monday or Wed.   Roe Rutherford Duke, PA-C 05/21/2019, 9:56 AM

## 2019-05-23 ENCOUNTER — Other Ambulatory Visit: Payer: Self-pay | Admitting: *Deleted

## 2019-05-23 DIAGNOSIS — I5043 Acute on chronic combined systolic (congestive) and diastolic (congestive) heart failure: Secondary | ICD-10-CM

## 2019-05-23 LAB — BASIC METABOLIC PANEL
BUN/Creatinine Ratio: 19 (ref 10–24)
BUN: 25 mg/dL (ref 8–27)
CO2: 26 mmol/L (ref 20–29)
Calcium: 9.1 mg/dL (ref 8.6–10.2)
Chloride: 97 mmol/L (ref 96–106)
Creatinine, Ser: 1.3 mg/dL — ABNORMAL HIGH (ref 0.76–1.27)
GFR calc Af Amer: 61 mL/min/{1.73_m2} (ref >59)
GFR calc non Af Amer: 53 mL/min/{1.73_m2} — ABNORMAL LOW (ref >59)
Glucose: 120 mg/dL — ABNORMAL HIGH (ref 65–99)
Potassium: 3.4 mmol/L — ABNORMAL LOW (ref 3.5–5.2)
Sodium: 141 mmol/L (ref 134–144)

## 2019-05-23 NOTE — Telephone Encounter (Signed)
error 

## 2019-05-25 ENCOUNTER — Other Ambulatory Visit: Payer: Self-pay

## 2019-05-25 ENCOUNTER — Other Ambulatory Visit (HOSPITAL_COMMUNITY)
Admission: RE | Admit: 2019-05-25 | Discharge: 2019-05-25 | Disposition: A | Discharge status: Home / Self Care | Payer: Medicare Other | Source: Ambulatory Visit | Attending: Cardiovascular Disease | Admitting: Cardiovascular Disease

## 2019-05-25 ENCOUNTER — Telehealth: Payer: Self-pay | Admitting: Cardiovascular Disease

## 2019-05-25 DIAGNOSIS — Z1159 Encounter for screening for other viral diseases: Secondary | ICD-10-CM | POA: Diagnosis present

## 2019-05-25 NOTE — Telephone Encounter (Signed)
Returned call to patient's daughter Waynetta Sandy.She is going to start helping father with his medications she is requesting a list of his medications to be faxed to her with a cover letter to fax # [phone].Advised I will send to RN in triage to fax.

## 2019-05-25 NOTE — Progress Notes (Signed)
Patient scheduled for procedure 6/8. Came for covid testing 6/3. Advised patient too early, should really come back on 6/5- patient unable to come back at later date. Patient agrees to strict at home quarantine until procedure 05/30/19.

## 2019-05-25 NOTE — Telephone Encounter (Signed)
New Message     Pts daughter is calling and is wondering if she can have the medication profile sent to her through fax so she can make a medication box Fax number  [phone]

## 2019-05-25 NOTE — Telephone Encounter (Signed)
Med list faxed to the number provided

## 2019-05-26 LAB — NOVEL CORONAVIRUS, NAA (HOSP ORDER, SEND-OUT TO REF LAB; TAT 18-24 HRS): SARS-CoV-2, NAA: NOT DETECTED

## 2019-05-30 ENCOUNTER — Encounter (HOSPITAL_COMMUNITY): Payer: Self-pay

## 2019-05-30 ENCOUNTER — Encounter (HOSPITAL_COMMUNITY)
Admission: RE | Disposition: A | Discharge status: Home / Self Care | Payer: Self-pay | Source: Home / Self Care | Attending: Cardiovascular Disease

## 2019-05-30 ENCOUNTER — Other Ambulatory Visit: Payer: Self-pay

## 2019-05-30 ENCOUNTER — Ambulatory Visit (HOSPITAL_COMMUNITY): Payer: Medicare Other | Admitting: Anesthesiology

## 2019-05-30 ENCOUNTER — Ambulatory Visit (HOSPITAL_COMMUNITY)
Admission: RE | Admit: 2019-05-30 | Discharge: 2019-05-30 | Disposition: A | Discharge status: Home / Self Care | Payer: Medicare Other | Attending: Cardiovascular Disease | Admitting: Cardiovascular Disease

## 2019-05-30 DIAGNOSIS — E039 Hypothyroidism, unspecified: Secondary | ICD-10-CM | POA: Insufficient documentation

## 2019-05-30 DIAGNOSIS — E7800 Pure hypercholesterolemia, unspecified: Secondary | ICD-10-CM | POA: Diagnosis not present

## 2019-05-30 DIAGNOSIS — Z8249 Family history of ischemic heart disease and other diseases of the circulatory system: Secondary | ICD-10-CM | POA: Diagnosis not present

## 2019-05-30 DIAGNOSIS — Z79899 Other long term (current) drug therapy: Secondary | ICD-10-CM | POA: Diagnosis not present

## 2019-05-30 DIAGNOSIS — Z7901 Long term (current) use of anticoagulants: Secondary | ICD-10-CM | POA: Diagnosis not present

## 2019-05-30 DIAGNOSIS — Z7982 Long term (current) use of aspirin: Secondary | ICD-10-CM | POA: Insufficient documentation

## 2019-05-30 DIAGNOSIS — I130 Hypertensive heart and chronic kidney disease with heart failure and stage 1 through stage 4 chronic kidney disease, or unspecified chronic kidney disease: Secondary | ICD-10-CM | POA: Insufficient documentation

## 2019-05-30 DIAGNOSIS — K219 Gastro-esophageal reflux disease without esophagitis: Secondary | ICD-10-CM | POA: Insufficient documentation

## 2019-05-30 DIAGNOSIS — Z953 Presence of xenogenic heart valve: Secondary | ICD-10-CM | POA: Insufficient documentation

## 2019-05-30 DIAGNOSIS — I6523 Occlusion and stenosis of bilateral carotid arteries: Secondary | ICD-10-CM | POA: Diagnosis not present

## 2019-05-30 DIAGNOSIS — M1A9XX0 Chronic gout, unspecified, without tophus (tophi): Secondary | ICD-10-CM | POA: Insufficient documentation

## 2019-05-30 DIAGNOSIS — Z95810 Presence of automatic (implantable) cardiac defibrillator: Secondary | ICD-10-CM | POA: Insufficient documentation

## 2019-05-30 DIAGNOSIS — I4819 Other persistent atrial fibrillation: Secondary | ICD-10-CM | POA: Diagnosis not present

## 2019-05-30 DIAGNOSIS — I350 Nonrheumatic aortic (valve) stenosis: Secondary | ICD-10-CM | POA: Diagnosis not present

## 2019-05-30 DIAGNOSIS — Z888 Allergy status to other drugs, medicaments and biological substances status: Secondary | ICD-10-CM | POA: Diagnosis not present

## 2019-05-30 DIAGNOSIS — I252 Old myocardial infarction: Secondary | ICD-10-CM | POA: Insufficient documentation

## 2019-05-30 DIAGNOSIS — I739 Peripheral vascular disease, unspecified: Secondary | ICD-10-CM | POA: Insufficient documentation

## 2019-05-30 DIAGNOSIS — I712 Thoracic aortic aneurysm, without rupture: Secondary | ICD-10-CM | POA: Insufficient documentation

## 2019-05-30 DIAGNOSIS — I5042 Chronic combined systolic (congestive) and diastolic (congestive) heart failure: Secondary | ICD-10-CM

## 2019-05-30 DIAGNOSIS — I671 Cerebral aneurysm, nonruptured: Secondary | ICD-10-CM | POA: Diagnosis not present

## 2019-05-30 DIAGNOSIS — D500 Iron deficiency anemia secondary to blood loss (chronic): Secondary | ICD-10-CM | POA: Diagnosis not present

## 2019-05-30 DIAGNOSIS — M1990 Unspecified osteoarthritis, unspecified site: Secondary | ICD-10-CM | POA: Insufficient documentation

## 2019-05-30 DIAGNOSIS — I5043 Acute on chronic combined systolic (congestive) and diastolic (congestive) heart failure: Secondary | ICD-10-CM | POA: Diagnosis not present

## 2019-05-30 DIAGNOSIS — N183 Chronic kidney disease, stage 3 (moderate): Secondary | ICD-10-CM | POA: Diagnosis not present

## 2019-05-30 DIAGNOSIS — I255 Ischemic cardiomyopathy: Secondary | ICD-10-CM | POA: Insufficient documentation

## 2019-05-30 DIAGNOSIS — I25810 Atherosclerosis of coronary artery bypass graft(s) without angina pectoris: Secondary | ICD-10-CM | POA: Insufficient documentation

## 2019-05-30 HISTORY — PX: CARDIOVERSION: SHX1299

## 2019-05-30 SURGERY — CARDIOVERSION
Anesthesia: General

## 2019-05-30 MED ORDER — APIXABAN 5 MG PO TABS: 5.0000 mg | ORAL_TABLET | Freq: Two times a day (BID) | ORAL | 11 refills | Status: DC

## 2019-05-30 MED ORDER — CARVEDILOL 6.25 MG PO TABS: 3.1250 mg | ORAL_TABLET | Freq: Two times a day (BID) | ORAL | 11 refills | Status: DC

## 2019-05-30 MED ORDER — CARVEDILOL 6.25 MG PO TABS: 6.2500 mg | ORAL_TABLET | Freq: Two times a day (BID) | ORAL | 11 refills | Status: DC

## 2019-05-30 MED ORDER — PHENYLEPHRINE HCL (PRESSORS) 10 MG/ML IV SOLN
INTRAVENOUS | Status: DC | PRN
  Administered 2019-05-30: 40 ug via INTRAVENOUS

## 2019-05-30 MED ORDER — SODIUM CHLORIDE 0.9 % IV SOLN
INTRAVENOUS | Status: DC | PRN
  Administered 2019-05-30: 08:00:00 via INTRAVENOUS

## 2019-05-30 MED ORDER — LIDOCAINE 2% (20 MG/ML) 5 ML SYRINGE
INTRAMUSCULAR | Status: DC | PRN
  Administered 2019-05-30: 100 mg via INTRAVENOUS

## 2019-05-30 MED ORDER — PROPOFOL 10 MG/ML IV BOLUS
INTRAVENOUS | Status: DC | PRN
  Administered 2019-05-30: 50 mg via INTRAVENOUS

## 2019-05-30 NOTE — Anesthesia Procedure Notes (Signed)
Procedure Name: General with mask airway Date/Time: 05/30/2019 9:01 AM Performed by: Jenne Campus, CRNA Pre-anesthesia Checklist: Patient identified, Emergency Drugs available, Suction available, Patient being monitored and Timeout performed Patient Re-evaluated:Patient Re-evaluated prior to induction Oxygen Delivery Method: Ambu bag Preoxygenation: Pre-oxygenation with 100% oxygen

## 2019-05-30 NOTE — Transfer of Care (Signed)
Immediate Anesthesia Transfer of Care Note  Patient: [name]  Procedure(s) Performed: CARDIOVERSION (N/A )  Patient Location: Endoscopy Unit  Anesthesia Type:General  Level of Consciousness: awake, oriented and patient cooperative  Airway & Oxygen Therapy: Patient Spontanous Breathing and Patient connected to nasal cannula oxygen  Post-op Assessment: Report given to RN and Post -op Vital signs reviewed and stable  Post vital signs: Reviewed  Last Vitals:  Vitals Value Taken Time  BP 132/62 05/30/2019  9:12 AM  Temp 36.6 C 05/30/2019  9:12 AM  Pulse 65 05/30/2019  9:12 AM  Resp 18 05/30/2019  9:12 AM  SpO2 97 % 05/30/2019  9:12 AM    Last Pain:  Vitals:   05/30/19 0912  TempSrc: Axillary  PainSc: 0-No pain         Complications: No apparent anesthesia complications

## 2019-05-30 NOTE — Anesthesia Preprocedure Evaluation (Addendum)
Anesthesia Evaluation  Patient identified by MRN, date of birth, ID band Patient awake    Reviewed: Allergy & Precautions, H&P , NPO status , Patient's Chart, lab work & pertinent test results, reviewed documented beta blocker date and time   History of Anesthesia Complications Negative for: history of anesthetic complications  Airway Mallampati: I  TM Distance: >3 FB Neck ROM: Full    Dental  (+) Edentulous Upper, Edentulous Lower, Dental Advisory Given   Pulmonary shortness of breath,    Pulmonary exam normal breath sounds clear to auscultation       Cardiovascular hypertension, Pt. on medications and Pt. on home beta blockers + angina + CAD (s/p CABG), + Past MI, + Peripheral Vascular Disease and +CHF  Normal cardiovascular exam+ dysrhythmias (biV pacer defib) + Cardiac Defibrillator (device has never fired) + Valvular Problems/Murmurs (s/p AVR)  Rhythm:Regular Rate:Normal  Echo 02/2018 - Left ventricle: The cavity size was mildly dilated. There was mild concentric hypertrophy. Systolic function was moderately to severely reduced. The estimated ejection fraction was in the range of 30% to 35%. Diffuse hypokinesis worse in the anterolateral, inferolateral, inferior, and apical myocardium. Doppler parameters are consistent with a reversible restrictive pattern, indicative of decreased left ventricular diastolic compliance and/or increased left atrial pressure (grade 3 diastolic dysfunction). Doppler parameters are consistent with   high ventricular filling pressure. - Aortic valve: A bioprosthesis was present. Valve area (VTI): 2.02 cm^2. Valve area (Vmax): 2.02 cm^2. Valve area (Vmean): 1.95 cm^2. - Mitral valve: Transvalvular velocity was within the normal range. There was no evidence for stenosis. There was trivial regurgitation. - Left atrium: The atrium was severely dilated. - Right ventricle: The cavity size was normal. Wall  thickness was normal. Systolic function was mildly reduced. - Right atrium: The atrium was moderately dilated. - Atrial septum: No defect or patent foramen ovale was identified by color flow Doppler. - Tricuspid valve: There was mild regurgitation. - Pulmonary arteries: Systolic pressure was increased. PA peak  pressure: 44 mm Hg (S).   Neuro/Psych Anxiety negative neurological ROS     GI/Hepatic Neg liver ROS, GERD  Medicated and Controlled,  Endo/Other  Hypothyroidism (on replacement)   Renal/GU Renal disease     Musculoskeletal  (+) Arthritis ,   Abdominal   Peds  Hematology  (+) anemia ,   Anesthesia Other Findings   Reproductive/Obstetrics                            Anesthesia Physical  Anesthesia Plan  ASA: IV  Anesthesia Plan: General   Post-op Pain Management:    Induction: Intravenous  PONV Risk Score and Plan: 2 and TIVA and Propofol infusion  Airway Management Planned: Mask  Additional Equipment:   Intra-op Plan:   Post-operative Plan:   Informed Consent: I have reviewed the patients History and Physical, chart, labs and discussed the procedure including the risks, benefits and alternatives for the proposed anesthesia with the patient or authorized representative who has indicated his/her understanding and acceptance.     Dental advisory given  Plan Discussed with: CRNA  Anesthesia Plan Comments:         Anesthesia Quick Evaluation

## 2019-05-30 NOTE — Discharge Instructions (Signed)
Electrical Cardioversion, Care After °This sheet gives you information about how to care for yourself after your procedure. Your health care provider may also give you more specific instructions. If you have problems or questions, contact your health care provider. °What can I expect after the procedure? °After the procedure, it is common to have: °· Some redness on the skin where the shocks were given. °Follow these instructions at home: ° °· Do not drive for 24 hours if you were given a medicine to help you relax (sedative). °· Take over-the-counter and prescription medicines only as told by your health care provider. °· Ask your health care provider how to check your pulse. Check it often. °· Rest for 48 hours after the procedure or as told by your health care provider. °· Avoid or limit your caffeine use as told by your health care provider. °Contact a health care provider if: °· You feel like your heart is beating too quickly or your pulse is not regular. °· You have a serious muscle cramp that does not go away. °Get help right away if: ° °· You have discomfort in your chest. °· You are dizzy or you feel faint. °· You have trouble breathing or you are short of breath. °· Your speech is slurred. °· You have trouble moving an arm or leg on one side of your body. °· Your fingers or toes turn cold or blue. °This information is not intended to replace advice given to you by your health care provider. Make sure you discuss any questions you have with your health care provider. °Document Released: 09/28/2013 Document Revised: 07/11/2016 Document Reviewed: 06/13/2016 °Elsevier Interactive Patient Education © 2019 Elsevier Inc. ° °

## 2019-05-30 NOTE — Op Note (Signed)
Procedure: Electrical Cardioversion Indications:  Atrial Fibrillation  Procedure Details:  Consent: Risks of procedure as well as the alternatives and risks of each were explained to the (patient/caregiver).  Consent for procedure obtained.  Time Out: Verified patient identification, verified procedure, site/side was marked, verified correct patient position, special equipment/implants available, medications/allergies/relevent history reviewed, required imaging and test results available.  Performed  Patient placed on cardiac monitor, pulse oximetry, supplemental oxygen as necessary.  Sedation given: propofol IV, Dr. Lissa Hoard Pacer pads placed anterior and posterior chest.  Cardioverted 1 time(s).  Cardioversion with synchronized biphasic 120J shock.  Evaluation: Findings: Post procedure EKG shows: A paced BiV paced Complications: None Patient did tolerate procedure well.  CRT-D device check after DCCV shows normal function. Reprogrammed DDDR 60 bpm.  Time Spent Directly with the Patient:  30 minutes   Bowden Boody 05/30/2019, 9:22 AM

## 2019-05-30 NOTE — Anesthesia Postprocedure Evaluation (Signed)
Anesthesia Post Note  Patient: [name]  Procedure(s) Performed: CARDIOVERSION (N/A )     Patient location during evaluation: PACU Anesthesia Type: General Level of consciousness: sedated and patient cooperative Pain management: pain level controlled Vital Signs Assessment: post-procedure vital signs reviewed and stable Respiratory status: spontaneous breathing Cardiovascular status: stable Anesthetic complications: no    Last Vitals:  Vitals:   05/30/19 0944 05/30/19 0956  BP: (!) 151/70 (!) 153/69  Pulse:    Resp: 13 14  Temp:    SpO2: 95% 96%    Last Pain:  Vitals:   05/30/19 0944  TempSrc:   PainSc: 0-No pain                 Nolon Nations

## 2019-05-30 NOTE — Interval H&P Note (Signed)
History and Physical Interval Note:  05/30/2019 8:51 AM  [name]  has presented today for surgery, with the diagnosis of AFIB.  The various methods of treatment have been discussed with the patient and family. After consideration of risks, benefits and other options for treatment, the patient has consented to  Procedure(s): CARDIOVERSION (N/A) as a surgical intervention.  The patient's history has been reviewed, patient examined, no change in status, stable for surgery.  I have reviewed the patient's chart and labs.  Questions were answered to the patient's satisfaction.     Berlin Mokry

## 2019-05-31 ENCOUNTER — Encounter (HOSPITAL_COMMUNITY): Payer: Self-pay | Admitting: Cardiovascular Disease

## 2019-06-13 ENCOUNTER — Telehealth: Payer: Self-pay | Admitting: Physician Assistant

## 2019-06-13 NOTE — Telephone Encounter (Signed)
Result Notes for Basic Metabolic Panel (BMET)  Notes recorded by Ledora Bottcher, PA on 05/25/2019 at 7:49 AM EDT  Please let patient and daughter know his labs look good. Please check to see if he is still taking extra lasix; how is his swelling?   Left detail message with results, ok per DPR, and to call back about swelling and Lasix dosage.

## 2019-06-15 ENCOUNTER — Telehealth: Payer: Self-pay | Admitting: Cardiovascular Disease

## 2019-06-15 NOTE — Telephone Encounter (Signed)
Patient is ok with TELE-VISIT/ consent/ my chart declined/ pre reg completed

## 2019-06-17 ENCOUNTER — Telehealth: Payer: Self-pay

## 2019-06-17 ENCOUNTER — Encounter: Payer: Medicare Other | Admitting: *Deleted

## 2019-06-17 NOTE — Telephone Encounter (Signed)
Left message for patient to remind of missed remote transmission.  

## 2019-06-17 NOTE — Telephone Encounter (Signed)
LVM for pt, reminding him of his appt on 06-20-19 with Dr C.

## 2019-06-18 ENCOUNTER — Other Ambulatory Visit: Payer: Self-pay | Admitting: Cardiovascular Disease

## 2019-06-20 ENCOUNTER — Telehealth (INDEPENDENT_AMBULATORY_CARE_PROVIDER_SITE_OTHER): Payer: Medicare Other | Admitting: Cardiovascular Disease

## 2019-06-20 ENCOUNTER — Encounter: Payer: Self-pay | Admitting: Cardiovascular Disease

## 2019-06-20 DIAGNOSIS — Z9889 Other specified postprocedural states: Secondary | ICD-10-CM

## 2019-06-20 DIAGNOSIS — Z95810 Presence of automatic (implantable) cardiac defibrillator: Secondary | ICD-10-CM | POA: Diagnosis not present

## 2019-06-20 DIAGNOSIS — I5042 Chronic combined systolic (congestive) and diastolic (congestive) heart failure: Secondary | ICD-10-CM

## 2019-06-20 DIAGNOSIS — I480 Paroxysmal atrial fibrillation: Secondary | ICD-10-CM

## 2019-06-20 DIAGNOSIS — I25810 Atherosclerosis of coronary artery bypass graft(s) without angina pectoris: Secondary | ICD-10-CM | POA: Diagnosis not present

## 2019-06-20 DIAGNOSIS — I495 Sick sinus syndrome: Secondary | ICD-10-CM

## 2019-06-20 DIAGNOSIS — I5043 Acute on chronic combined systolic (congestive) and diastolic (congestive) heart failure: Secondary | ICD-10-CM | POA: Diagnosis not present

## 2019-06-20 DIAGNOSIS — I10 Essential (primary) hypertension: Secondary | ICD-10-CM

## 2019-06-20 DIAGNOSIS — I739 Peripheral vascular disease, unspecified: Secondary | ICD-10-CM

## 2019-06-20 DIAGNOSIS — E7800 Pure hypercholesterolemia, unspecified: Secondary | ICD-10-CM

## 2019-06-20 DIAGNOSIS — Z952 Presence of prosthetic heart valve: Secondary | ICD-10-CM

## 2019-06-20 NOTE — Patient Instructions (Signed)
Medication Instructions:  Your physician recommends that you continue on your current medications as directed. Please refer to the Current Medication list given to you today.  If you need a refill on your cardiac medications before your next appointment, please call your pharmacy.   Lab work: None ordered If you have labs (blood work) drawn today and your tests are completely normal, you will receive your results only by: MyChart Message (if you have MyChart) OR A paper copy in the mail If you have any lab test that is abnormal or we need to change your treatment, we will call you to review the results.  Testing/Procedures: None ordered  Follow-Up: At [facility], you and your health needs are our priority.  As part of our continuing mission to provide you with exceptional heart care, we have created designated Provider Care Teams.  These Care Teams include your primary Cardiologist (physician) and Advanced Practice Providers (APPs -  Physician Assistants and Nurse Practitioners) who all work together to provide you with the care you need, when you need it. You will need a follow up appointment in 3 months.  Please call our office 2 months in advance to schedule this appointment.  You may see Mihai Croitoru, MD or one of the following Advanced Practice Providers on your designated Care Team: Hao Meng, PA-C Angela Duke, PA-C       

## 2019-06-20 NOTE — Telephone Encounter (Signed)
Opened in error

## 2019-06-20 NOTE — Progress Notes (Signed)
Virtual Visit via Telephone Note   This visit type was conducted due to national recommendations for restrictions regarding the COVID-19 Pandemic (e.g. social distancing) in an effort to limit this patient's exposure and mitigate transmission in our community.  Due to his co-morbid illnesses, this patient is at least at moderate risk for complications without adequate follow up.  This format is felt to be most appropriate for this patient at this time.  The patient did not have access to video technology/had technical difficulties with video requiring transitioning to audio format only (telephone).  All issues noted in this document were discussed and addressed.  No physical exam could be performed with this format.  Please refer to the patient's chart for his  consent to [facility].   Evaluation Performed:  Follow-up visit  Date:  06/20/2019   ID:  [name], DOB 1941-11-08, MRN [id number]  Patient Location: Home Provider Location: Home  PCP:  Patient, No Pcp Per  Cardiologist:  Thurmon FairMihai Florencia Zaccaro, MD  Electrophysiologist:  None   Chief Complaint:  CHF/ AFib  History of Present Illness:    [name] is a 78 y.o. male with  a long-standing history of CAD with redo CABG, aortic valve replacement with bioprosthesis (29 mm mosaic), chronic  systolic heart failure due to ischemic cardiomyopathy, recurrent persistent atrial fibrillation, CRT-D, bilateral carotid stenoses and endarterectomy, returning for follow-up.  Several weeks ago he had an episode of protracted heart failure exacerbation that did not respond to diuretics, related to persistent atrial fibrillation and a reduction in CRT pacing.  He underwent cardioversion successfully on June 8 and is feeling a lot better since then.  He did have some problems with lower extremity edema, but after diuretic dose adjustments this has resolved.  His weight is staying steadily in the 168-171 pound range.  He does not  have orthopnea or shortness of breath with usual activity.  He develops dyspnea only when "he gets in a hurry".  He has an occasional headache.  He is doing a pacemaker download right now, but is not yet available on the [employer] website.  He has not had dizziness, syncope, falls or injuries.  He has not had any overt bleeding.  He does have  a lot of bruising on his forearms and shins.  He denies angina pectoris or focal neurological complaints  His most recent echocardiogram in March 2019 showed left ventricular ejection fraction of 30-35% with wall motion abnormalities in the inferior-inferolateral and anteroapical distribution.  Aortic prosthesis gradients were normal and there was no significant mitral regurgitation.  The Doppler parameters were consistent with high filling pressures.4  His most recent pacemaker download was performed before the cardioversion.  The patient does not have symptoms concerning for COVID-19 infection (fever, chills, cough, or new shortness of breath).    Past Medical History:  Diagnosis Date  . Anginal pain (HCC)   . Anxiety   . Aortic stenosis    a. s/p bioprosthetic AVR in 2010  . Arthritis    gout  . CAD (coronary artery disease)    a. s/p CABG in 1992 b. redo CABG in 2004 c. cath in 2010 showing patent LIMA-LAD, free RIMA-OM, and SVG-OM, and 80% stenosis of small PLA branch  . Chest pain   . CHF (congestive heart failure) (HCC)   . Dysrhythmia   . GERD (gastroesophageal reflux disease)    with gas and bloating probably cause of chest pain  . H/O atrial  flutter    a. s/p ablation and not on anticoagulation secondary to GIB and alcohol abuse.   . H/O: gout   . Hyperlipidemia   . Hypertension   . ICD (implantable cardiac defibrillator) infection, s/p removal of ICD 07/12/2012   re-implanted [employer] BiV ICD, serial number [id number]   . Ischemic cardiomyopathy    a. EF previously 20-25%, improved to 40-45% by echo in 03/2013 b. 10/2017: echo  showing of 30-35% with Grade 2 DD, normal function of AVR, moderate MR.   . Myocardial infarction (HCC)   . 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 Shortness of breath    Past Surgical History:  Procedure Laterality Date  . BI-VENTRICULAR IMPLANTABLE CARDIOVERTER DEFIBRILLATOR Right 08/18/2012   [employer] BiV ICD, serial number [id number] ; Procedure: BI-VENTRICULAR IMPLANTABLE CARDIOVERTER DEFIBRILLATOR  (CRT-D);  Surgeon: Marinus MawGregg W Taylor, MD;  [facility];  Service: Cardiovascular;  Laterality: Right;  . BIV ICD GENERATOR CHANGEOUT N/A 09/08/2018   Procedure: BIV ICD GENERATOR CHANGEOUT;  Surgeon: Thurmon Fairroitoru, Kingdom Vanzanten, MD;  Location: [facility] INVASIVE CV LAB;  Service: Cardiovascular;  Laterality: N/A;  . CARDIAC CATHETERIZATION  Dec 18, 2003   with a presentation of atrial flutter with rapid ventricular response. He had a cath and two stents to his PDA after his SVG to his RCA. His other graft were patent. He had LV with EF of 30%.   . CARDIOVERSION N/A 05/30/2019   Procedure: CARDIOVERSION;  Surgeon: Thurmon Fairroitoru, Abdurrahman Petersheim, MD;  Location: [facility] ENDOSCOPY;  Service: Cardiovascular;  Laterality: N/A;  . CAROTID ENDARTERECTOMY  11/2002   left in December 2003 and known renal artery stenosis of 60-70%  . CORONARY ARTERY BYPASS GRAFT  1993   redo in 2000, he has had multiple MI previous to both procedures.  . IMPLANTABLE CARDIOVERTER DEFIBRILLATOR (ICD) GENERATOR CHANGE N/A 06/25/2012   Procedure: ICD GENERATOR CHANGE;  Surgeon: Marinus MawGregg W Taylor, MD;  [facility];  Service: Cardiovascular;  Laterality: N/A;  . VALVE REPLACEMENT  2010     Current Meds  Medication Sig  . amiodarone (PACERONE) 200 MG tablet Take 1 tablet (200 mg total) by mouth daily.  [name]. apixaban (ELIQUIS) 5 MG TABS tablet Take 1 tablet (5 mg total) by mouth 2 (two) times daily.  [name]. apixaban (ELIQUIS) 5 MG TABS tablet Take 1 tablet (5 mg total) by mouth 2 (two) times daily.  [name]. atorvastatin  (LIPITOR) 10 MG tablet Take 1 tablet (10 mg total) by mouth daily.  . carvedilol (COREG) 6.25 MG tablet Take 1 tablet (6.25 mg total) by mouth 2 (two) times daily with a meal.  . colchicine 0.6 MG tablet TAKE 1 TABLET (0.6 MG TOTAL) BY MOUTH AS NEEDED (GOUT ATTACKS).  . Cyanocobalamin (VITAMIN B-12) 5000 MCG TBDP Take 5,000 mcg by mouth daily.  . ferrous sulfate (IRON SUPPLEMENT) 325 (65 FE) MG tablet Take 1 tablet (325 mg total) by mouth daily with breakfast.  . furosemide (LASIX) 40 MG tablet Take 1 tablet (40 mg total) by mouth 2 (two) times daily.  [name]. ibuprofen (ADVIL) 200 MG tablet Take 400 mg by mouth every 6 (six) hours as needed for headache or moderate pain.  [name]. KLOR-CON M10 10 MEQ tablet TAKE 1 TABLET BY MOUTH EVERY DAY (Patient taking differently: Take 10 mEq by mouth daily. )  . Menthol, Topical Analgesic, (BENGAY EX) Apply 1 application topically daily as needed (pain).  . nitroGLYCERIN (NITROSTAT) 0.4 MG SL tablet Place 1  tablet (0.4 mg) under the tongue every 5 minutes as needed for chest pain for up to 3 doses/call 9-1-1 and MD if no relief     Allergies:   Atorvastatin and Other   Social History   Tobacco Use  . Smoking status: Never Smoker  . Smokeless tobacco: Current User    Types: Chew  Substance Use Topics  . Alcohol use: Yes    Alcohol/week: 14.0 standard drinks    Types: 14 Cans of beer per week  . Drug use: No     Family Hx: The patient's family history includes CAD in his brother.  ROS:   Please see the history of present illness.     All other systems are reviewed and are negative   Prior CV studies:   The following studies were reviewed today:  Labs/Other Tests and Data Reviewed:    EKG:  An ECG dated 05/05/2019 was personally reviewed today and demonstrated:  Atrial fibrillation with biventricular paced rhythm, positive R waves in V1  Recent Labs: 01/20/2019: Magnesium 1.8; TSH 4.220 05/05/2019: ALT 10; Hemoglobin 12.1; Platelets 129 05/23/2019: BUN  25; Creatinine, Ser 1.30; Potassium 3.4; Sodium 141   Recent Lipid Panel Lab Results  Component Value Date/Time   CHOL 87 (L) 05/05/2019 02:02 PM   TRIG 44 05/05/2019 02:02 PM   HDL 46 05/05/2019 02:02 PM   CHOLHDL 1.9 05/05/2019 02:02 PM   CHOLHDL 4.2 12/19/2016 08:41 AM   LDLCALC 32 05/05/2019 02:02 PM    Wt Readings from Last 3 Encounters:  06/20/19 169 lb (76.7 kg)  05/30/19 170 lb (77.1 kg)  05/05/19 173 lb 6.4 oz (78.7 kg)     Objective:    Vital Signs:  BP 120/70   Ht 6\' 1"  (1.854 m)   Wt 169 lb (76.7 kg)   BMI 22.30 kg/m    Vital signs reviewed.  Unable to establish video link.  ASSESSMENT & PLAN:    1. CHF: Far as I can tell with the limitations of a telephone visit he appears euvolemic and substantially improved from a clinical point of view.  He has not yet performed a download on his device today so I cannot check his thoracic impedance.  Seems to be back to NYHA functional class II.  Most recent EF 30-35%.    He has had issues with hypotension on ACE inhibitors in the past.  On carvedilol. 2. CAD s/p redo CABG:  Does not have angina pectoris.  Not on aspirin due to recurrent bleeding and use of apixaban.  On statin. (cath in 2010 showing patent LIMA-LAD, free RIMA-OM, and SVG-OM, and 80% stenosis of small PLA branch). 3. CRT-D:  Download pending today.   4. HLP:  On statin.  Recent lipid profile 5. HTN:  Well controlled, will not tolerate higher doses of beta-blocker or addition of ARB. 6. Carotid disease s/p bilateral carotid endarterectomies. No recent neurological complaints.  7. AFib:  Led to heart failure probably due to loss of CRT.  Except for cardioversion on June 8; amiodarone dose increased.  Compliant with apixaban so far, but complains of easy bruising and is worried about long-term bleeding complications as well as the cost of the medication.  Currently the plan is for him to stop the apixaban for 30 days after anticoagulation.  However, if it looks like  the atrial fibrillation will be frequently recurrent, he definitely should stay on long-term anticoagulation.  We will need to apply to the manufacturer for patient assistance 8.  Amiodarone: Repeat liver function tests and TSH in 6 months. 9. S/P AVR: March 2019 echo shows normal bioprosthetic valve function. 10. Ascending aortic aneurysm: Stable size.  5.0 cm by CT angiogram of the chest in June 2018, 5.1 cm in March 2019.  We discussed the fact that we could perform a referral to cardiac surgery, but he is not a great candidate for 3rd thoracotomy. 11. CKD 3:    Creatinine has been stable around 1.3 12. Gout:  Thankfully without gout attacks while we are adjusting his diuretics. 13. Fe def anemia:  Hemoglobin has been stable in the 11.5-12 range 14. Hypothyroidism: Last TSH checked in January had normalized at 4.2 with a borderline low free T4 of 0.78.  Has been off amiodarone for about 12 months now. 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 Asymptomatic.    COVID-19 Education: The signs and symptoms of COVID-19 were discussed with the patient and how to seek care for testing (follow up with PCP or arrange E-visit).  The importance of social distancing was discussed today.  Time:   Today, I have spent 21 minutes with the patient with telehealth technology discussing the above problems.     Medication Adjustments/Labs and Tests Ordered: Current medicines are reviewed at length with the patient today.  Concerns regarding medicines are outlined above.   Tests Ordered: No orders of the defined types were placed in this encounter.   Medication Changes: No orders of the defined types were placed in this encounter.   Patient Instructions  Medication Instructions:  Your physician recommends that you continue on your current medications as directed. Please refer to the Current Medication list given to  you today.  If you need a refill on your cardiac medications before your next appointment, please call your pharmacy.   Lab work: None ordered If you have labs (blood work) drawn today and your tests are completely normal, you will receive your results only by: [place] (if you have MyChart) OR A paper copy in the mail If you have any lab test that is abnormal or we need to change your treatment, we will call you to review the results.  Testing/Procedures: None ordered  Follow-Up: At [facility], you and your health needs are our priority.  As part of our continuing mission to provide you with exceptional heart care, we have created designated Provider Care Teams.  These Care Teams include your primary Cardiologist (physician) and Advanced Practice Providers (APPs -  Physician Assistants and Nurse Practitioners) who all work together to provide you with the care you need, when you need it. You will need a follow up appointment in 3 months.  Please call our office 2 months in advance to schedule this appointment.  You may see Sanda Klein, MD or one of the following Advanced Practice Providers on your designated Care Team: Almyra Deforest, PA-C Fabian Sharp, PA-C       Signed, Sanda Klein, MD  06/20/2019 11:05 AM    [place]

## 2019-06-23 ENCOUNTER — Encounter: Payer: Self-pay | Admitting: Cardiology

## 2019-06-28 ENCOUNTER — Telehealth: Payer: Self-pay | Admitting: *Deleted

## 2019-06-28 NOTE — Telephone Encounter (Signed)
[name] or [employer], do you think you can troubleshoot his download issues, please? Want to see if afib has stayed away after CV and if his BiV pacing % is any better. Thanks!

## 2019-06-28 NOTE — Telephone Encounter (Signed)
Call placed to the patient to inform him that the office had not received a download. He stated that he has tried three times to do it and wants to know if he needs to come by the office to get this completed.

## 2019-06-29 ENCOUNTER — Ambulatory Visit (INDEPENDENT_AMBULATORY_CARE_PROVIDER_SITE_OTHER): Payer: Medicare Other | Admitting: *Deleted

## 2019-06-29 DIAGNOSIS — I255 Ischemic cardiomyopathy: Secondary | ICD-10-CM | POA: Diagnosis not present

## 2019-06-29 LAB — CUP PACEART REMOTE DEVICE CHECK
Battery Remaining Longevity: 81 mo
Battery Voltage: 2.98 V
Brady Statistic AP VP Percent: 94.28 %
Brady Statistic AP VS Percent: 0.25 %
Brady Statistic AS VP Percent: 5.42 %
Brady Statistic AS VS Percent: 0.05 %
Brady Statistic RA Percent Paced: 93.77 %
Brady Statistic RV Percent Paced: 98.95 %
Date Time Interrogation Session: 20200708172727
HighPow Impedance: 68 Ohm
Implantable Lead Implant Date: 20130828
Implantable Lead Implant Date: 20130828
Implantable Lead Implant Date: 20130828
Implantable Lead Location: 753858
Implantable Lead Location: 753859
Implantable Lead Location: 753860
Implantable Lead Model: 4194
Implantable Lead Model: 5076
Implantable Lead Model: 6935
Implantable Pulse Generator Implant Date: 20190918
Lead Channel Impedance Value: 247 Ohm
Lead Channel Impedance Value: 285 Ohm
Lead Channel Impedance Value: 285 Ohm
Lead Channel Impedance Value: 418 Ohm
Lead Channel Impedance Value: 513 Ohm
Lead Channel Impedance Value: 703 Ohm
Lead Channel Pacing Threshold Amplitude: 0.75 V
Lead Channel Pacing Threshold Amplitude: 0.875 V
Lead Channel Pacing Threshold Amplitude: 1 V
Lead Channel Pacing Threshold Pulse Width: 0.4 ms
Lead Channel Pacing Threshold Pulse Width: 0.4 ms
Lead Channel Pacing Threshold Pulse Width: 0.4 ms
Lead Channel Sensing Intrinsic Amplitude: 0.875 mV
Lead Channel Sensing Intrinsic Amplitude: 0.875 mV
Lead Channel Sensing Intrinsic Amplitude: 5 mV
Lead Channel Sensing Intrinsic Amplitude: 5 mV
Lead Channel Setting Pacing Amplitude: 1.5 V
Lead Channel Setting Pacing Amplitude: 2 V
Lead Channel Setting Pacing Amplitude: 2.5 V
Lead Channel Setting Pacing Pulse Width: 0.4 ms
Lead Channel Setting Pacing Pulse Width: 0.4 ms
Lead Channel Setting Sensing Sensitivity: 0.3 mV

## 2019-06-29 NOTE — Telephone Encounter (Signed)
I called the pt to help him with his monitor however, he did not answer the phone. I left a message with my direct office number to call back for assistance.

## 2019-06-29 NOTE — Telephone Encounter (Signed)
I helped the pt send a manual transmission with his home monitor. Transmission received. I told him the nurse will take a look at it and call him back. Pt states he will be gone from 2:30 pm until 5:30 pm today. I told him if the nurse can not call him before 2:30 pm then she will call him tomorrow. Pt verbalized understanding.

## 2019-06-30 NOTE — Telephone Encounter (Signed)
Excellent, thanks! Lattie Haw, can you please call him tomorrow and tell him that he has stayed in normal rhythm since the cardioversion, that were getting a much better percentage of resynchronization pacing that should help his heart pumping performance and that there are signs that his fluid situation is good.

## 2019-06-30 NOTE — Telephone Encounter (Addendum)
Returning pt call, unable to leave VM. Transmission reviewed. Presenting rhythm is AP/BV. Although it appears the pt had an AF episode, it is from 05/29/19, prior to CV on 05/30/19. BiV pacing 99%.

## 2019-07-01 NOTE — Telephone Encounter (Signed)
Left a message for the patient to call back.  

## 2019-07-05 NOTE — Telephone Encounter (Signed)
Patient made aware of results and verbalized understanding.  The patient stated that he was 179 pounds at his telehealth visit on 06/20/2019. Today he weighs between 155-157 pounds. He said that his normal weight used to be around 175.   He feels great except by the end of the day he is tired and worn out but stated that his breathing is a "whole lot better" He has been eating more food lately to try and increase his weight. He stated that his urination has been somewhat decreased lately but  Nothing out of the ordinary.

## 2019-07-05 NOTE — Telephone Encounter (Signed)
Thanks, please ask him to call if weight gets over 160 lb, don't wait for it to get to 170 or when he gets dyspneic. MCr

## 2019-07-06 NOTE — Telephone Encounter (Signed)
Left a message for the patient to call back.  

## 2019-07-07 NOTE — Telephone Encounter (Signed)
Left a message for the patient to call back.  

## 2019-07-08 NOTE — Telephone Encounter (Signed)
The patient has been made aware and verbalized his understanding.  

## 2019-07-08 NOTE — Telephone Encounter (Signed)
Follow up ° ° °Patient is returning your call. Please call. ° ° ° °

## 2019-07-11 ENCOUNTER — Encounter: Payer: Self-pay | Admitting: Cardiology

## 2019-07-11 NOTE — Progress Notes (Signed)
Remote ICD transmission.   

## 2019-07-12 ENCOUNTER — Other Ambulatory Visit: Payer: Self-pay | Admitting: Cardiovascular Disease

## 2019-08-30 ENCOUNTER — Other Ambulatory Visit: Payer: Self-pay | Admitting: Cardiovascular Disease

## 2019-08-30 DIAGNOSIS — I5042 Chronic combined systolic (congestive) and diastolic (congestive) heart failure: Secondary | ICD-10-CM

## 2019-08-30 DIAGNOSIS — I10 Essential (primary) hypertension: Secondary | ICD-10-CM

## 2019-08-30 DIAGNOSIS — E611 Iron deficiency: Secondary | ICD-10-CM

## 2019-08-30 DIAGNOSIS — I480 Paroxysmal atrial fibrillation: Secondary | ICD-10-CM

## 2019-08-30 NOTE — Telephone Encounter (Signed)
Please advise if OK to refill. Thank you! 

## 2019-08-31 NOTE — Telephone Encounter (Signed)
I think it is time for him to stop the iron supplement. Please recheck CBC, iron studies and CMET in 1 month

## 2019-09-01 NOTE — Telephone Encounter (Signed)
Left a message for the patient to call back.  

## 2019-09-06 NOTE — Telephone Encounter (Signed)
The patient has been notified about discontinuing the Ferrous Sulfate. He will get his labs done when he comes for his appointment with Dr. Sallyanne Kuster in October.   The patient stated that he has gained 5 pounds in the last week from 163 to 168. He denies shortness of breath but does have bilateral lower extremity edema. He stated that the swelling gets worse throughout the day and goes down in the morning. He has not been elevating his legs throughout the day and has been advised to do this as much as possible.  He currently takes Furosemide 40 mg bid.

## 2019-09-06 NOTE — Telephone Encounter (Signed)
The patient has been advised to increase his furosemide to 80 mg in the AM and 40 mg in the PM. If he is not back to 163 pounds by Friday then he will call the office back.

## 2019-09-06 NOTE — Telephone Encounter (Signed)
Please increase the furosemide temporarily to 80 mg in AM and 40 mg in PM until he is back to 163 lb. Call back on Friday if not at 163 lb yet.

## 2019-09-26 ENCOUNTER — Ambulatory Visit (INDEPENDENT_AMBULATORY_CARE_PROVIDER_SITE_OTHER): Payer: Medicare Other | Admitting: Cardiovascular Disease

## 2019-09-26 ENCOUNTER — Other Ambulatory Visit: Payer: Self-pay

## 2019-09-26 ENCOUNTER — Encounter: Payer: Self-pay | Admitting: Cardiovascular Disease

## 2019-09-26 VITALS — BP 116/65 | HR 79 | Temp 97.1°F | Ht 73.0 in | Wt 173.0 lb

## 2019-09-26 DIAGNOSIS — I25810 Atherosclerosis of coronary artery bypass graft(s) without angina pectoris: Secondary | ICD-10-CM | POA: Diagnosis not present

## 2019-09-26 DIAGNOSIS — I6523 Occlusion and stenosis of bilateral carotid arteries: Secondary | ICD-10-CM

## 2019-09-26 DIAGNOSIS — Z79899 Other long term (current) drug therapy: Secondary | ICD-10-CM

## 2019-09-26 DIAGNOSIS — D500 Iron deficiency anemia secondary to blood loss (chronic): Secondary | ICD-10-CM

## 2019-09-26 DIAGNOSIS — E039 Hypothyroidism, unspecified: Secondary | ICD-10-CM

## 2019-09-26 DIAGNOSIS — I5042 Chronic combined systolic (congestive) and diastolic (congestive) heart failure: Secondary | ICD-10-CM | POA: Diagnosis not present

## 2019-09-26 DIAGNOSIS — I712 Thoracic aortic aneurysm, without rupture: Secondary | ICD-10-CM

## 2019-09-26 DIAGNOSIS — Z95810 Presence of automatic (implantable) cardiac defibrillator: Secondary | ICD-10-CM

## 2019-09-26 DIAGNOSIS — I10 Essential (primary) hypertension: Secondary | ICD-10-CM | POA: Diagnosis not present

## 2019-09-26 DIAGNOSIS — N183 Chronic kidney disease, stage 3 unspecified: Secondary | ICD-10-CM

## 2019-09-26 DIAGNOSIS — I4819 Other persistent atrial fibrillation: Secondary | ICD-10-CM | POA: Diagnosis not present

## 2019-09-26 DIAGNOSIS — Z5181 Encounter for therapeutic drug level monitoring: Secondary | ICD-10-CM

## 2019-09-26 DIAGNOSIS — E611 Iron deficiency: Secondary | ICD-10-CM

## 2019-09-26 DIAGNOSIS — I7121 Aneurysm of the ascending aorta, without rupture: Secondary | ICD-10-CM

## 2019-09-26 NOTE — Progress Notes (Signed)
Cardiology Office Note:    Date:  09/26/2019   ID:  DEON DUER, DOB 03/26/1941, MRN [id number]  PCP:  Patient, No Pcp Per  Cardiologist:  Thurmon Fair, MD  Electrophysiologist:  None   Referring MD: No ref. provider found   chief complaint: Dyspnea  History of Present Illness:    [name] is a 78 y.o. male with a hx of coronary artery disease with redo bypass surgery 2004, aortic valve replacement with biological prosthesis, severe ischemic cardiomyopathy with combined systolic and diastolic heart failure, CRT-D, bilateral carotid stenosis with bilateral endarterectomy in the remote past, now with a protracted episode of acute heart failure exacerbation due to persistent atrial fibrillation that has been going on for the last 3 months.  He is generally felt much better this summer.  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  He thinks that something changed about 2 weeks ago.  He is a little more short of breath if he "pushes it".  He has not had edema and denies orthopnea.  Device interrogation shows that he went back into persistent atrial fibrillation on September 23.  Rate control is good and he still has 98.3% biventricular pacing which is a marked improvement compared to last year.  His optivol remains in normal range.  He is maintaining an excellent separation between average daytime and nighttime heart rates.  Device function is otherwise normal and estimated generator longevity is 6.4 years.  He reports 100% compliance with all his medicines including the anticoagulant which he takes twice a day.  He has not missed any doses in the last month.  He denies any bleeding problems falls or injuries.  His most recent echocardiogram in March 2019 showed left ventricular ejection fraction of 30-35% with wall motion abnormalities in the inferior-inferolateral and anteroapical distribution.  Aortic prosthesis gradients were normal and  there was no significant mitral regurgitation.  The Doppler parameters were consistent with high filling pressures.  The patient does not have symptoms concerning for COVID-19 infection (fever, chills, cough, or Unexplained shortness of breath).   Past Medical History:  Diagnosis Date  . Anginal pain (HCC)   . Anxiety   . Aortic stenosis    a. s/p bioprosthetic AVR in 2010  . Arthritis    gout  . CAD (coronary artery disease)    a. s/p CABG in 1992 b. redo CABG in 2004 c. cath in 2010 showing patent LIMA-LAD, free RIMA-OM, and SVG-OM, and 80% stenosis of small PLA branch  . Chest pain   . CHF (congestive heart failure) (HCC)   . Dysrhythmia   . GERD (gastroesophageal reflux disease)    with gas and bloating probably cause of chest pain  . H/O atrial flutter    a. s/p ablation and not on anticoagulation secondary to GIB and alcohol abuse.   . H/O: gout   . Hyperlipidemia   . Hypertension   . ICD (implantable cardiac defibrillator) infection, s/p removal of ICD 07/12/2012   re-implanted Medtronic BiV ICD, serial number [id number]   . Ischemic cardiomyopathy    a. EF previously 20-25%, improved to 40-45% by echo in 03/2013 b. 10/2017: echo showing of 30-35% with Grade 2 DD, normal function of AVR, moderate MR.   . Myocardial infarction (HCC)   . 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 Shortness of breath     Past Surgical History:  Procedure Laterality Date  . BI-VENTRICULAR IMPLANTABLE CARDIOVERTER DEFIBRILLATOR Right 08/18/2012   Medtronic BiV ICD, serial number [id number] ; Procedure: BI-VENTRICULAR IMPLANTABLE CARDIOVERTER DEFIBRILLATOR  (CRT-D);  Surgeon: Marinus Maw, MD;  Location: [facility] CATH LAB;  Service: Cardiovascular;  Laterality: Right;  . BIV ICD GENERATOR CHANGEOUT N/A 09/08/2018   Procedure: BIV ICD GENERATOR CHANGEOUT;  Surgeon: Thurmon Fair, MD;  Location: [facility] INVASIVE CV LAB;  Service: Cardiovascular;  Laterality: N/A;  .  CARDIAC CATHETERIZATION  Dec 18, 2003   with a presentation of atrial flutter with rapid ventricular response. He had a cath and two stents to his PDA after his SVG to his RCA. His other graft were patent. He had LV with EF of 30%.   . CARDIOVERSION N/A 05/30/2019   Procedure: CARDIOVERSION;  Surgeon: Thurmon Fair, MD;  Location: [facility] ENDOSCOPY;  Service: Cardiovascular;  Laterality: N/A;  . CAROTID ENDARTERECTOMY  11/2002   left in December 2003 and known renal artery stenosis of 60-70%  . CORONARY ARTERY BYPASS GRAFT  1993   redo in 2000, he has had multiple MI previous to both procedures.  . IMPLANTABLE CARDIOVERTER DEFIBRILLATOR (ICD) GENERATOR CHANGE N/A 06/25/2012   Procedure: ICD GENERATOR CHANGE;  Surgeon: Marinus Maw, MD;  Location[facility] C[facility] LAB;  Service: Cardiovascular;  Laterality: N/A;  . VALVE REPLACEMENT  2010    Current Medications: Current Meds  Medication Sig  . amiodarone (PACERONE) 200 MG tablet Take 1 tablet (200 mg total) by mouth daily.  [name] apixaban (ELIQUIS) 5 MG TABS tablet Take 1 tablet (5 mg total) by mouth 2 (two) times daily.  [name] atorvastatin (LIPITOR) 10 MG tablet TAKE 1 TABLET BY MOUTH EVERY DAY  . carvedilol (COREG) 6.25 MG tablet Take 1 tablet (6.25 mg total) by mouth 2 (two) times daily with a meal.  . colchicine 0.6 MG tablet TAKE 1 TABLET (0.6 MG TOTAL) BY MOUTH AS NEEDED (GOUT ATTACKS).  . Cyanocobalamin (VITAMIN B-12) 5000 MCG TBDP Take 5,000 mcg by mouth daily.  . furosemide (LASIX) 40 MG tablet Take 1 tablet (40 mg total) by mouth 2 (two) times daily.  [name] ibuprofen (ADVIL) 200 MG tablet Take 400 mg by mouth every 6 (six) hours as needed for headache or moderate pain.  [name] KLOR-CON M10 10 MEQ tablet TAKE 1 TABLET BY MOUTH EVERY DAY  . Menthol, Topical Analgesic, (BENGAY EX) Apply 1 application topically daily as needed (pain).  . nitroGLYCERIN (NITROSTAT) 0.4 MG SL tablet Place 1 tablet (0.4 mg) under the tongue every 5 minutes as needed for chest pain for  up to 3 doses/call 9-1-1 and MD if no relief  . [DISCONTINUED] apixaban (ELIQUIS) 5 MG TABS tablet Take 1 tablet (5 mg total) by mouth 2 (two) times daily.     Allergies:   Atorvastatin and Other   Social History   Socioeconomic History  . Marital status: Married    Spouse name: Not on file  . Number of children: Not on file  . Years of education: Not on file  . Highest education level: Not on file  Occupational History  . Not on file  Social Needs  . Financial resource strain: Not on file  . Food insecurity    Worry: Not on file    Inability: Not on file  . Transportation needs    Medical: Not on file    Non-medical: Not on file  Tobacco Use  . Smoking status: Never Smoker  . Smokeless tobacco: Current User  Types: Chew  Substance and Sexual Activity  . Alcohol use: Yes    Alcohol/week: 14.0 standard drinks    Types: 14 Cans of beer per week  . Drug use: No  . Sexual activity: Not on file  Lifestyle  . Physical activity    Days per week: Not on file    Minutes per session: Not on file  . Stress: Not on file  Relationships  . Social Musicianconnections    Talks on phone: Not on file    Gets together: Not on file    Attends religious service: Not on file    Active member of club or organization: Not on file    Attends meetings of clubs or organizations: Not on file    Relationship status: Not on file  Other Topics Concern  . Not on file  Social History Narrative  . Not on file     Family History: The patient's family history includes CAD in his brother.  ROS:   Please see the history of present illness.   All other systems are reviewed and are negative  EKGs/Labs/Other Studies Reviewed:    The following studies were reviewed today: Comprehensive pacemaker download today  EKG:  EKG is not ordered today.  The intracardiac electrogram shows atrial fibrillation and biventricular paced rhythm  Recent Labs: 01/20/2019: Magnesium 1.8; TSH 4.220 05/05/2019: ALT 10;  Hemoglobin 12.1; Platelets 129 05/23/2019: BUN 25; Creatinine, Ser 1.30; Potassium 3.4; Sodium 141  Recent Lipid Panel    Component Value Date/Time   CHOL 87 (L) 05/05/2019 1402   TRIG 44 05/05/2019 1402   HDL 46 05/05/2019 1402   CHOLHDL 1.9 05/05/2019 1402   CHOLHDL 4.2 12/19/2016 0841   VLDL 15 12/19/2016 0841   LDLCALC 32 05/05/2019 1402    Physical Exam:    VS:  BP 116/65   Pulse 79   Temp (!) 97.1 F (36.2 C)   Ht 6\' 1"  (1.854 m)   Wt 173 lb (78.5 kg)   SpO2 97%   BMI 22.82 kg/m     Wt Readings from Last 3 Encounters:  09/26/19 173 lb (78.5 kg)  06/20/19 169 lb (76.7 kg)  05/30/19 170 lb (77.1 kg)      General: Alert, oriented x3, no distress, defibrillator site right subclavian area looks healthy Head: no evidence of trauma, PERRL, EOMI, no exophtalmos or lid lag, no myxedema, no xanthelasma; normal ears, nose and oropharynx Neck: 7-8 cm elevation in jugular venous pulsations and no hepatojugular reflux; brisk carotid pulses without delay and no carotid bruits Chest: clear to auscultation, no signs of consolidation by percussion or palpation, normal fremitus, symmetrical and full respiratory excursions Cardiovascular: normal position and quality of the apical impulse, regular rhythm, normal first and paradoxically split second heart sounds, 2/6 early peaking systolic ejection murmur in the right upper sternal border.  No diastolic murmurs, rubs or gallops Abdomen: no tenderness or distention, no masses by palpation, no abnormal pulsatility or arterial bruits, normal bowel sounds, no hepatosplenomegaly Extremities: no clubbing, cyanosis or edema; 2+ radial, ulnar and brachial pulses bilaterally; 2+ right femoral, posterior tibial and dorsalis pedis pulses; 2+ left femoral, posterior tibial and dorsalis pedis pulses; no subclavian or femoral bruits Neurological: grossly nonfocal Psych: Normal mood and affect   ASSESSMENT:    1. Coronary artery disease involving  coronary bypass graft of native heart without angina pectoris   2. Chronic combined systolic and diastolic heart failure (HCC)   3. Persistent atrial fibrillation (HCC)  4. Essential hypertension   5. Iron deficiency   6. Biventricular automatic implantable cardioverter defibrillator in situ   7. Bilateral carotid artery stenosis   8. Ascending aortic aneurysm ([place])   9. Acquired hypothyroidism   10. Encounter for monitoring amiodarone therapy   11. Stage 3 chronic kidney disease, unspecified whether stage 3a or 3b CKD   12. Iron deficiency anemia due to chronic blood loss    PLAN:    In order of problems listed above:  1. CHF: NYHA functional class II.  Has some subtle signs of hypervolemia by physical exam, but the thoracic impedance remains at baseline.  Most recent EF 30-35%.  Taking carvedilol, but he has had issues with hypotension on ACE inhibitors in the past.   2. AFib:  He initially seemed to tolerate atrial fibrillation well last year but after several weeks began to develop congestive heart failure.  I think we will go ahead with a cardioversion early this time to avoid that.  He has been compliant with anticoagulation. This procedure has been fully reviewed with the patient and written informed consent has been obtained. he will have a repeat remote download in a couple of days to make sure he has not converted spontaneously. 3. CAD s/p redo CABG:  He does not have angina, he is taking statin.  Not on aspirin due to full anticoagulation. (cath in 2010 showing patent LIMA-LAD, free RIMA-OM, and SVG-OM, and 80% stenosis of small PLA branch). 4. CRT-D:  Markedly improved percentage of biventricular pacing on carvedilol and amiodarone.  Anticipate deterioration if we allowed the atrial fibrillation to go home on checked.  5. HLP: On statin.    Due for a repeat repeat his lipid profile today. 6. HTN:  Excellent control. 7. Carotid diseases/p bilateral carotid endarterectomies.   He denies any symptoms that would suggest focal neurological ischemic events. 8. S/P XBD:ZHGDJ 2019 echo shows normal bioprosthetic valve function. 9. Ascending aortic aneurysm:Stable size. 5.0 cm by CT angiogram of the chest in June 2018, 5.1 cm in March 2019. He is not a candidate for 3rd thoracotomy therefore routine monitoring does not appear to be justified. 10. CKD 3:   Recheck labs today. 11. Gout:  No recent gout attacks 12. Fe def anemia:Recheck iron studies and CBC today 13. Hypothyroidism:Due for repeat evaluation, every 6 months while taking amiodarone 14. 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 Asymptomatic.  We will bring him back for elective direct-current cardioversion after 3 weeks of oral anticoagulation. This procedure has been fully reviewed with the patient and written informed consent has been obtained.   Medication Adjustments/Labs and Tests Ordered: Current medicines are reviewed at length with the patient today.  Concerns regarding medicines are outlined above.  Orders Placed This Encounter  Procedures  . PSA  . TSH  . Urinalysis   No orders of the defined types were placed in this encounter.   Patient Instructions  Medication Instructions:  None changes If you need a refill on your cardiac medications before your next appointment, please call your pharmacy.   Lab work: Your provider would like for you to have the following labs today: CBC, TSH, PSA, Urinalysis, Iron, and CMET  If you have labs (blood work) drawn today and your tests are completely normal, you will receive your results only by: [place] (if you have MyChart) OR A paper copy in the mail If you have any lab test that is abnormal  or we need to change your treatment, we will call you to review the results.  Follow-Up: At [facility], you and your health needs are our priority.  As part  of our continuing mission to provide you with exceptional heart care, we have created designated Provider Care Teams.  These Care Teams include your primary Cardiologist (physician) and Advanced Practice Providers (APPs -  Physician Assistants and Nurse Practitioners) who all work together to provide you with the care you need, when you need it. You will need a follow up appointment in 3 months.  Please call our office 2 months in advance to schedule this appointment.  You may see Thurmon Fair, MD or one of the following Advanced Practice Providers on your designated Care Team: Azalee Course, [place] Micah Flesher, [place]  Please do a download 2 weeks after the cardioversion  Any Other Special Instructions Will Be Listed Below (If Applicable).  You are scheduled for a Cardioversion on 09/30/2019 with Dr. Royann Shivers.  Please arrive at the [facility] (Main Entrance A) at [facility]: [address] [place], [place] [address] at 8 am. (1 hour prior to procedure) DIET: Nothing to eat or drink after midnight except a sip of water with medications (see medication instructions below)  Medication Instructions: Hold furosemide the morning of the procedure  Continue your anticoagulant: Eliquis. If you miss a dose, please call the office immediately at [phone] You will need to continue your anticoagulant after your procedure until you  are told by your provider that it is safe to stop   Labs:  You will need to have the coronavirus test completed prior to your procedure. You may have this done at [facility]. You do no need an appointment. Please have this completed on 09/27/2019. Someone will direct you to the appropriate testing line. Please tell them that you are there for procedure testing. Stay in your car and someone will be with you shortly. Please make sure to have all other labs completed before this test because you will need to stay quarantined until your procedure.   You must have a  responsible person to drive you home and stay in the waiting area during your procedure. Failure to do so could result in cancellation.  Bring your insurance cards.  *Special Note: Every effort is made to have your procedure done on time. Occasionally there are emergencies that occur at the hospital that may cause delays. Please be patient if a delay does occur.       Signed, Thurmon Fair, MD  09/26/2019 2:00 PM    [facility]

## 2019-09-26 NOTE — H&P (View-Only) (Signed)
Cardiology Office Note:    Date:  09/26/2019   ID:  [name], DOB 06/25/1941, MRN [id number]  PCP:  Patient, No Pcp Per  Cardiologist:  Monterrius Cardosa, MD  Electrophysiologist:  None   Referring MD: No ref. provider found   chief complaint: Dyspnea  History of Present Illness:    [name] is a 78 y.o. male with a hx of coronary artery disease with redo bypass surgery 2004, aortic valve replacement with biological prosthesis, severe ischemic cardiomyopathy with combined systolic and diastolic heart failure, CRT-D, bilateral carotid stenosis with bilateral endarterectomy in the remote past, now with a protracted episode of acute heart failure exacerbation due to persistent atrial fibrillation that has been going on for the last 3 months.  He is generally felt much better this summer.  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  He thinks that something changed about 2 weeks ago.  He is a little more short of breath if he "pushes it".  He has not had edema and denies orthopnea.  Device interrogation shows that he went back into persistent atrial fibrillation on September 23.  Rate control is good and he still has 98.3% biventricular pacing which is a marked improvement compared to last year.  His optivol remains in normal range.  He is maintaining an excellent separation between average daytime and nighttime heart rates.  Device function is otherwise normal and estimated generator longevity is 6.4 years.  He reports 100% compliance with all his medicines including the anticoagulant which he takes twice a day.  He has not missed any doses in the last month.  He denies any bleeding problems falls or injuries.  His most recent echocardiogram in March 2019 showed left ventricular ejection fraction of 30-35% with wall motion abnormalities in the inferior-inferolateral and anteroapical distribution.  Aortic prosthesis gradients were normal and  there was no significant mitral regurgitation.  The Doppler parameters were consistent with high filling pressures.  The patient does not have symptoms concerning for COVID-19 infection (fever, chills, cough, or Unexplained shortness of breath).   Past Medical History:  Diagnosis Date  . Anginal pain (HCC)   . Anxiety   . Aortic stenosis    a. s/p bioprosthetic AVR in 2010  . Arthritis    gout  . CAD (coronary artery disease)    a. s/p CABG in 1992 b. redo CABG in 2004 c. cath in 2010 showing patent LIMA-LAD, free RIMA-OM, and SVG-OM, and 80% stenosis of small PLA branch  . Chest pain   . CHF (congestive heart failure) (HCC)   . Dysrhythmia   . GERD (gastroesophageal reflux disease)    with gas and bloating probably cause of chest pain  . H/O atrial flutter    a. s/p ablation and not on anticoagulation secondary to GIB and alcohol abuse.   . H/O: gout   . Hyperlipidemia   . Hypertension   . ICD (implantable cardiac defibrillator) infection, s/p removal of ICD 07/12/2012   re-implanted Medtronic BiV ICD, serial number [id number]   . Ischemic cardiomyopathy    a. EF previously 20-25%, improved to 40-45% by echo in 03/2013 b. 10/2017: echo showing of 30-35% with Grade 2 DD, normal function of AVR, moderate MR.   . Myocardial infarction (HCC)   . 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 Shortness of breath     Past Surgical History:    Procedure Laterality Date  . BI-VENTRICULAR IMPLANTABLE CARDIOVERTER DEFIBRILLATOR Right 08/18/2012   Medtronic BiV ICD, serial number [id number] ; Procedure: BI-VENTRICULAR IMPLANTABLE CARDIOVERTER DEFIBRILLATOR  (CRT-D);  Surgeon: Marinus Maw, MD;  Location: [facility] CATH LAB;  Service: Cardiovascular;  Laterality: Right;  . BIV ICD GENERATOR CHANGEOUT N/A 09/08/2018   Procedure: BIV ICD GENERATOR CHANGEOUT;  Surgeon: Thurmon Fair, MD;  Location: [facility] INVASIVE CV LAB;  Service: Cardiovascular;  Laterality: N/A;  .  CARDIAC CATHETERIZATION  Dec 18, 2003   with a presentation of atrial flutter with rapid ventricular response. He had a cath and two stents to his PDA after his SVG to his RCA. His other graft were patent. He had LV with EF of 30%.   . CARDIOVERSION N/A 05/30/2019   Procedure: CARDIOVERSION;  Surgeon: Thurmon Fair, MD;  Location: [facility] ENDOSCOPY;  Service: Cardiovascular;  Laterality: N/A;  . CAROTID ENDARTERECTOMY  11/2002   left in December 2003 and known renal artery stenosis of 60-70%  . CORONARY ARTERY BYPASS GRAFT  1993   redo in 2000, he has had multiple MI previous to both procedures.  . IMPLANTABLE CARDIOVERTER DEFIBRILLATOR (ICD) GENERATOR CHANGE N/A 06/25/2012   Procedure: ICD GENERATOR CHANGE;  Surgeon: Marinus Maw, MD;  Location[facility] C[facility] LAB;  Service: Cardiovascular;  Laterality: N/A;  . VALVE REPLACEMENT  2010    Current Medications: Current Meds  Medication Sig  . amiodarone (PACERONE) 200 MG tablet Take 1 tablet (200 mg total) by mouth daily.  [name] apixaban (ELIQUIS) 5 MG TABS tablet Take 1 tablet (5 mg total) by mouth 2 (two) times daily.  [name] atorvastatin (LIPITOR) 10 MG tablet TAKE 1 TABLET BY MOUTH EVERY DAY  . carvedilol (COREG) 6.25 MG tablet Take 1 tablet (6.25 mg total) by mouth 2 (two) times daily with a meal.  . colchicine 0.6 MG tablet TAKE 1 TABLET (0.6 MG TOTAL) BY MOUTH AS NEEDED (GOUT ATTACKS).  . Cyanocobalamin (VITAMIN B-12) 5000 MCG TBDP Take 5,000 mcg by mouth daily.  . furosemide (LASIX) 40 MG tablet Take 1 tablet (40 mg total) by mouth 2 (two) times daily.  [name] ibuprofen (ADVIL) 200 MG tablet Take 400 mg by mouth every 6 (six) hours as needed for headache or moderate pain.  [name] KLOR-CON M10 10 MEQ tablet TAKE 1 TABLET BY MOUTH EVERY DAY  . Menthol, Topical Analgesic, (BENGAY EX) Apply 1 application topically daily as needed (pain).  . nitroGLYCERIN (NITROSTAT) 0.4 MG SL tablet Place 1 tablet (0.4 mg) under the tongue every 5 minutes as needed for chest pain for  up to 3 doses/call 9-1-1 and MD if no relief  . [DISCONTINUED] apixaban (ELIQUIS) 5 MG TABS tablet Take 1 tablet (5 mg total) by mouth 2 (two) times daily.     Allergies:   Atorvastatin and Other   Social History   Socioeconomic History  . Marital status: Married    Spouse name: Not on file  . Number of children: Not on file  . Years of education: Not on file  . Highest education level: Not on file  Occupational History  . Not on file  Social Needs  . Financial resource strain: Not on file  . Food insecurity    Worry: Not on file    Inability: Not on file  . Transportation needs    Medical: Not on file    Non-medical: Not on file  Tobacco Use  . Smoking status: Never Smoker  . Smokeless tobacco: Current User  Types: Chew  Substance and Sexual Activity  . Alcohol use: Yes    Alcohol/week: 14.0 standard drinks    Types: 14 Cans of beer per week  . Drug use: No  . Sexual activity: Not on file  Lifestyle  . Physical activity    Days per week: Not on file    Minutes per session: Not on file  . Stress: Not on file  Relationships  . Social Musicianconnections    Talks on phone: Not on file    Gets together: Not on file    Attends religious service: Not on file    Active member of club or organization: Not on file    Attends meetings of clubs or organizations: Not on file    Relationship status: Not on file  Other Topics Concern  . Not on file  Social History Narrative  . Not on file     Family History: The patient's family history includes CAD in his brother.  ROS:   Please see the history of present illness.   All other systems are reviewed and are negative  EKGs/Labs/Other Studies Reviewed:    The following studies were reviewed today: Comprehensive pacemaker download today  EKG:  EKG is not ordered today.  The intracardiac electrogram shows atrial fibrillation and biventricular paced rhythm  Recent Labs: 01/20/2019: Magnesium 1.8; TSH 4.220 05/05/2019: ALT 10;  Hemoglobin 12.1; Platelets 129 05/23/2019: BUN 25; Creatinine, Ser 1.30; Potassium 3.4; Sodium 141  Recent Lipid Panel    Component Value Date/Time   CHOL 87 (L) 05/05/2019 1402   TRIG 44 05/05/2019 1402   HDL 46 05/05/2019 1402   CHOLHDL 1.9 05/05/2019 1402   CHOLHDL 4.2 12/19/2016 0841   VLDL 15 12/19/2016 0841   LDLCALC 32 05/05/2019 1402    Physical Exam:    VS:  BP 116/65   Pulse 79   Temp (!) 97.1 F (36.2 C)   Ht 6\' 1"  (1.854 m)   Wt 173 lb (78.5 kg)   SpO2 97%   BMI 22.82 kg/m     Wt Readings from Last 3 Encounters:  09/26/19 173 lb (78.5 kg)  06/20/19 169 lb (76.7 kg)  05/30/19 170 lb (77.1 kg)      General: Alert, oriented x3, no distress, defibrillator site right subclavian area looks healthy Head: no evidence of trauma, PERRL, EOMI, no exophtalmos or lid lag, no myxedema, no xanthelasma; normal ears, nose and oropharynx Neck: 7-8 cm elevation in jugular venous pulsations and no hepatojugular reflux; brisk carotid pulses without delay and no carotid bruits Chest: clear to auscultation, no signs of consolidation by percussion or palpation, normal fremitus, symmetrical and full respiratory excursions Cardiovascular: normal position and quality of the apical impulse, regular rhythm, normal first and paradoxically split second heart sounds, 2/6 early peaking systolic ejection murmur in the right upper sternal border.  No diastolic murmurs, rubs or gallops Abdomen: no tenderness or distention, no masses by palpation, no abnormal pulsatility or arterial bruits, normal bowel sounds, no hepatosplenomegaly Extremities: no clubbing, cyanosis or edema; 2+ radial, ulnar and brachial pulses bilaterally; 2+ right femoral, posterior tibial and dorsalis pedis pulses; 2+ left femoral, posterior tibial and dorsalis pedis pulses; no subclavian or femoral bruits Neurological: grossly nonfocal Psych: Normal mood and affect   ASSESSMENT:    1. Coronary artery disease involving  coronary bypass graft of native heart without angina pectoris   2. Chronic combined systolic and diastolic heart failure (HCC)   3. Persistent atrial fibrillation (HCC)  4. Essential hypertension   5. Iron deficiency   6. Biventricular automatic implantable cardioverter defibrillator in situ   7. Bilateral carotid artery stenosis   8. Ascending aortic aneurysm ([place])   9. Acquired hypothyroidism   10. Encounter for monitoring amiodarone therapy   11. Stage 3 chronic kidney disease, unspecified whether stage 3a or 3b CKD   12. Iron deficiency anemia due to chronic blood loss    PLAN:    In order of problems listed above:  1. CHF: NYHA functional class II.  Has some subtle signs of hypervolemia by physical exam, but the thoracic impedance remains at baseline.  Most recent EF 30-35%.  Taking carvedilol, but he has had issues with hypotension on ACE inhibitors in the past.   2. AFib:  He initially seemed to tolerate atrial fibrillation well last year but after several weeks began to develop congestive heart failure.  I think we will go ahead with a cardioversion early this time to avoid that.  He has been compliant with anticoagulation. This procedure has been fully reviewed with the patient and written informed consent has been obtained. he will have a repeat remote download in a couple of days to make sure he has not converted spontaneously. 3. CAD s/p redo CABG:  He does not have angina, he is taking statin.  Not on aspirin due to full anticoagulation. (cath in 2010 showing patent LIMA-LAD, free RIMA-OM, and SVG-OM, and 80% stenosis of small PLA branch). 4. CRT-D:  Markedly improved percentage of biventricular pacing on carvedilol and amiodarone.  Anticipate deterioration if we allowed the atrial fibrillation to go home on checked.  5. HLP: On statin.    Due for a repeat repeat his lipid profile today. 6. HTN:  Excellent control. 7. Carotid diseases/p bilateral carotid endarterectomies.   He denies any symptoms that would suggest focal neurological ischemic events. 8. S/P XBD:ZHGDJ 2019 echo shows normal bioprosthetic valve function. 9. Ascending aortic aneurysm:Stable size. 5.0 cm by CT angiogram of the chest in June 2018, 5.1 cm in March 2019. He is not a candidate for 3rd thoracotomy therefore routine monitoring does not appear to be justified. 10. CKD 3:   Recheck labs today. 11. Gout:  No recent gout attacks 12. Fe def anemia:Recheck iron studies and CBC today 13. Hypothyroidism:Due for repeat evaluation, every 6 months while taking amiodarone 14. 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 Asymptomatic.  We will bring him back for elective direct-current cardioversion after 3 weeks of oral anticoagulation. This procedure has been fully reviewed with the patient and written informed consent has been obtained.   Medication Adjustments/Labs and Tests Ordered: Current medicines are reviewed at length with the patient today.  Concerns regarding medicines are outlined above.  Orders Placed This Encounter  Procedures  . PSA  . TSH  . Urinalysis   No orders of the defined types were placed in this encounter.   Patient Instructions  Medication Instructions:  None changes If you need a refill on your cardiac medications before your next appointment, please call your pharmacy.   Lab work: Your provider would like for you to have the following labs today: CBC, TSH, PSA, Urinalysis, Iron, and CMET  If you have labs (blood work) drawn today and your tests are completely normal, you will receive your results only by: [place] (if you have MyChart) OR A paper copy in the mail If you have any lab test that is abnormal  or we need to change your treatment, we will call you to review the results.  Follow-Up: At [facility], you and your health needs are our priority.  As part  of our continuing mission to provide you with exceptional heart care, we have created designated Provider Care Teams.  These Care Teams include your primary Cardiologist (physician) and Advanced Practice Providers (APPs -  Physician Assistants and Nurse Practitioners) who all work together to provide you with the care you need, when you need it. You will need a follow up appointment in 3 months.  Please call our office 2 months in advance to schedule this appointment.  You may see Thurmon Fair, MD or one of the following Advanced Practice Providers on your designated Care Team: Azalee Course, [place] Micah Flesher, [place]  Please do a download 2 weeks after the cardioversion  Any Other Special Instructions Will Be Listed Below (If Applicable).  You are scheduled for a Cardioversion on 09/30/2019 with Dr. Royann Shivers.  Please arrive at the [facility] (Main Entrance A) at [facility]: [address] [place], [place] [address] at 8 am. (1 hour prior to procedure) DIET: Nothing to eat or drink after midnight except a sip of water with medications (see medication instructions below)  Medication Instructions: Hold furosemide the morning of the procedure  Continue your anticoagulant: Eliquis. If you miss a dose, please call the office immediately at [phone] You will need to continue your anticoagulant after your procedure until you  are told by your provider that it is safe to stop   Labs:  You will need to have the coronavirus test completed prior to your procedure. You may have this done at [facility]. You do no need an appointment. Please have this completed on 09/27/2019. Someone will direct you to the appropriate testing line. Please tell them that you are there for procedure testing. Stay in your car and someone will be with you shortly. Please make sure to have all other labs completed before this test because you will need to stay quarantined until your procedure.   You must have a  responsible person to drive you home and stay in the waiting area during your procedure. Failure to do so could result in cancellation.  Bring your insurance cards.  *Special Note: Every effort is made to have your procedure done on time. Occasionally there are emergencies that occur at the hospital that may cause delays. Please be patient if a delay does occur.       Signed, Thurmon Fair, MD  09/26/2019 2:00 PM    [facility]

## 2019-09-26 NOTE — Patient Instructions (Signed)
Medication Instructions:  None changes If you need a refill on your cardiac medications before your next appointment, please call your pharmacy.   Lab work: Your provider would like for you to have the following labs today: CBC, TSH, PSA, Urinalysis, Iron, and CMET  If you have labs (blood work) drawn today and your tests are completely normal, you will receive your results only by: [place] (if you have MyChart) OR A paper copy in the mail If you have any lab test that is abnormal or we need to change your treatment, we will call you to review the results.  Follow-Up: At [facility], you and your health needs are our priority.  As part of our continuing mission to provide you with exceptional heart care, we have created designated Provider Care Teams.  These Care Teams include your primary Cardiologist (physician) and Advanced Practice Providers (APPs -  Physician Assistants and Nurse Practitioners) who all work together to provide you with the care you need, when you need it. You will need a follow up appointment in 3 months.  Please call our office 2 months in advance to schedule this appointment.  You may see Sanda Klein, MD or one of the following Advanced Practice Providers on your designated Care Team: Almyra Deforest, [place] Fabian Sharp, [place]  Please do a download 2 weeks after the cardioversion  Any Other Special Instructions Will Be Listed Below (If Applicable).  You are scheduled for a Cardioversion on 09/30/2019 with Dr. Sallyanne Kuster.  Please arrive at the [facility] (Main Entrance A) at [facility]: [place], [place] [address] at 8 am. (1 hour prior to procedure) DIET: Nothing to eat or drink after midnight except a sip of water with medications (see medication instructions below)  Medication Instructions: Hold furosemide the morning of the procedure  Continue your anticoagulant: Eliquis. If you miss a dose, please call the office immediately at  [phone] You will need to continue your anticoagulant after your procedure until you  are told by your provider that it is safe to stop   Labs:  You will need to have the coronavirus test completed prior to your procedure. You may have this done at [facility]. You do no need an appointment. Please have this completed on 09/27/2019. Someone will direct you to the appropriate testing line. Please tell them that you are there for procedure testing. Stay in your car and someone will be with you shortly. Please make sure to have all other labs completed before this test because you will need to stay quarantined until your procedure.   You must have a responsible person to drive you home and stay in the waiting area during your procedure. Failure to do so could result in cancellation.  Bring your insurance cards.  *Special Note: Every effort is made to have your procedure done on time. Occasionally there are emergencies that occur at the hospital that may cause delays. Please be patient if a delay does occur.

## 2019-09-27 ENCOUNTER — Telehealth: Payer: Self-pay | Admitting: *Deleted

## 2019-09-27 ENCOUNTER — Other Ambulatory Visit (HOSPITAL_COMMUNITY)
Admission: RE | Admit: 2019-09-27 | Discharge: 2019-09-27 | Disposition: A | Discharge status: Home / Self Care | Payer: Medicare Other | Source: Ambulatory Visit | Attending: Cardiovascular Disease | Admitting: Cardiovascular Disease

## 2019-09-27 DIAGNOSIS — I4891 Unspecified atrial fibrillation: Secondary | ICD-10-CM | POA: Diagnosis not present

## 2019-09-27 DIAGNOSIS — I480 Paroxysmal atrial fibrillation: Secondary | ICD-10-CM

## 2019-09-27 DIAGNOSIS — Z01812 Encounter for preprocedural laboratory examination: Secondary | ICD-10-CM | POA: Insufficient documentation

## 2019-09-27 DIAGNOSIS — Z20828 Contact with and (suspected) exposure to other viral communicable diseases: Secondary | ICD-10-CM | POA: Insufficient documentation

## 2019-09-27 DIAGNOSIS — I10 Essential (primary) hypertension: Secondary | ICD-10-CM

## 2019-09-27 DIAGNOSIS — E611 Iron deficiency: Secondary | ICD-10-CM

## 2019-09-27 LAB — URINALYSIS
Bilirubin, UA: NEGATIVE
Glucose, UA: NEGATIVE
Ketones, UA: NEGATIVE
Leukocytes,UA: NEGATIVE
Nitrite, UA: NEGATIVE
Protein,UA: NEGATIVE
RBC, UA: NEGATIVE
Specific Gravity, UA: 1.011 (ref 1.005–1.030)
Urobilinogen, Ur: 0.2 mg/dL (ref 0.2–1.0)
pH, UA: 6 (ref 5.0–7.5)

## 2019-09-27 LAB — COMPREHENSIVE METABOLIC PANEL
ALT: 8 IU/L (ref 0–44)
AST: 19 IU/L (ref 0–40)
Albumin/Globulin Ratio: 1.4 (ref 1.2–2.2)
Albumin: 4.2 g/dL (ref 3.7–4.7)
Alkaline Phosphatase: 86 IU/L (ref 39–117)
BUN/Creatinine Ratio: 21 (ref 10–24)
BUN: 33 mg/dL — ABNORMAL HIGH (ref 8–27)
Bilirubin Total: 0.4 mg/dL (ref 0.0–1.2)
CO2: 24 mmol/L (ref 20–29)
Calcium: 9.2 mg/dL (ref 8.6–10.2)
Chloride: 102 mmol/L (ref 96–106)
Creatinine, Ser: 1.55 mg/dL — ABNORMAL HIGH (ref 0.76–1.27)
GFR calc Af Amer: 50 mL/min/{1.73_m2} — ABNORMAL LOW (ref >59)
GFR calc non Af Amer: 43 mL/min/{1.73_m2} — ABNORMAL LOW (ref >59)
Globulin, Total: 3.1 g/dL (ref 1.5–4.5)
Glucose: 69 mg/dL (ref 65–99)
Potassium: 3.8 mmol/L (ref 3.5–5.2)
Sodium: 141 mmol/L (ref 134–144)
Total Protein: 7.3 g/dL (ref 6.0–8.5)

## 2019-09-27 LAB — CBC
Hematocrit: 26.5 % — ABNORMAL LOW (ref 37.5–51.0)
Hemoglobin: 8.7 g/dL — ABNORMAL LOW (ref 13.0–17.7)
MCH: 30.4 pg (ref 26.6–33.0)
MCHC: 32.8 g/dL (ref 31.5–35.7)
MCV: 93 fL (ref 79–97)
Platelets: 190 10*3/uL (ref 150–450)
RBC: 2.86 x10E6/uL — ABNORMAL LOW (ref 4.14–5.80)
RDW: 12.8 % (ref 11.6–15.4)
WBC: 4.2 10*3/uL (ref 3.4–10.8)

## 2019-09-27 LAB — IRON,TIBC AND FERRITIN PANEL
Ferritin: 46 ng/mL (ref 30–400)
Iron Saturation: 8 % — CL (ref 15–55)
Iron: 29 ug/dL — ABNORMAL LOW (ref 38–169)
Total Iron Binding Capacity: 378 ug/dL (ref 250–450)
UIBC: 349 ug/dL — ABNORMAL HIGH (ref 111–343)

## 2019-09-27 LAB — SARS CORONAVIRUS 2 (TAT 6-24 HRS): SARS Coronavirus 2: NEGATIVE

## 2019-09-27 LAB — PSA: Prostate Specific Ag, Serum: 1.2 ng/mL (ref 0.0–4.0)

## 2019-09-27 LAB — TSH: TSH: 5.25 u[IU]/mL — ABNORMAL HIGH (ref 0.450–4.500)

## 2019-09-27 NOTE — Telephone Encounter (Signed)
-----   Message from Sanda Klein, MD sent at 09/27/2019  8:56 AM EDT ----- He is moderately anemic again.  Please tell him to stop taking ibuprofen. Acetaminophen is OK. We need to restart his iron supplements (ferrous sulfate 325 mg twice daily) and folic acid (1 mg daily). After his cardioversion, we will talk again about stopping his anticoagulant (but no sooner than 30 days after the cardioversion). Borderline TSH. PSA is normal. Kidney function slightly worse than baseline. Please schedule for BMET, TSH, free T4, CBC in 1 month, OK to draw in [place].

## 2019-09-27 NOTE — Telephone Encounter (Signed)
Left a message for the patient to call back.  

## 2019-09-28 ENCOUNTER — Encounter: Payer: Medicare Other | Admitting: *Deleted

## 2019-09-28 ENCOUNTER — Other Ambulatory Visit: Payer: Self-pay | Admitting: Cardiovascular Disease

## 2019-09-28 ENCOUNTER — Other Ambulatory Visit: Payer: Self-pay | Admitting: *Deleted

## 2019-09-28 DIAGNOSIS — I480 Paroxysmal atrial fibrillation: Secondary | ICD-10-CM

## 2019-09-28 MED ORDER — FERROUS SULFATE 325 (65 FE) MG PO TBEC: 325.0000 mg | DELAYED_RELEASE_TABLET | Freq: Two times a day (BID) | ORAL | 11 refills | Status: DC

## 2019-09-28 MED ORDER — FOLIC ACID 1 MG PO TABS: 1.0000 mg | ORAL_TABLET | Freq: Every day | ORAL | 11 refills | Status: DC

## 2019-09-28 MED ORDER — SODIUM CHLORIDE 0.9% FLUSH: 3.0000 mL | Freq: Two times a day (BID) | INTRAVENOUS | Status: AC

## 2019-09-28 NOTE — Telephone Encounter (Signed)
Patient made aware of results and verbalized understanding. He has been advised to avoid ibuprofen.   Prescriptions have been sent in for the Ferrous Sulfate 544 mg bid and Folic Acid 1 mg daily. He has been advised to check over the counter to see if they may be cheaper that way.  Lab orders have been placed.

## 2019-09-28 NOTE — Telephone Encounter (Signed)
Rx request sent to pharmacy.  

## 2019-09-29 NOTE — Anesthesia Preprocedure Evaluation (Addendum)
Anesthesia Evaluation  Patient identified by MRN, date of birth, ID band Patient awake    Reviewed: Allergy & Precautions, NPO status , Patient's Chart, lab work & pertinent test results  History of Anesthesia Complications Negative for: history of anesthetic complications  Airway Mallampati: II  TM Distance: >3 FB Neck ROM: Full    Dental  (+) Edentulous Upper, Edentulous Lower   Pulmonary neg pulmonary ROS,    Pulmonary exam normal        Cardiovascular hypertension, Pt. on home beta blockers and Pt. on medications + angina with exertion + CAD, + Past MI, + CABG (1992, redo 2004), + Peripheral Vascular Disease and +CHF  + dysrhythmias Atrial Fibrillation  Rhythm:Regular Rate:Normal   S/p AVR 2010  '19 TTE - LV cavity size was mildly dilated, mild concentric hypertrophy. EF 30% to 35%. Diffuse hypokinesis worse in the anterolateral, inferolateral, inferior, and apical myocardium.Grade 3 diastolic dysfunction. AV bioprosthesis was present. Trivial MR. LA was severely dilated. RV systolic function was mildly reduced. RA was moderately dilated. Mild TR. PASP was increased. PA peak pressure: 44 mm Hg     Neuro/Psych PSYCHIATRIC DISORDERS Anxiety negative neurological ROS     GI/Hepatic GERD  Controlled,(+)     substance abuse  alcohol use,   Endo/Other  Hypothyroidism   Renal/GU Renal InsufficiencyRenal disease     Musculoskeletal  (+) Arthritis ,   Abdominal   Peds  Hematology  (+) anemia ,   Anesthesia Other Findings   Reproductive/Obstetrics                           Anesthesia Physical Anesthesia Plan  ASA: III  Anesthesia Plan: General   Post-op Pain Management:    Induction: Intravenous  PONV Risk Score and Plan: 2 and Treatment may vary due to age or medical condition and Propofol infusion  Airway Management Planned: Mask and Natural Airway  Additional Equipment:  None  Intra-op Plan:   Post-operative Plan:   Informed Consent: I have reviewed the patients History and Physical, chart, labs and discussed the procedure including the risks, benefits and alternatives for the proposed anesthesia with the patient or authorized representative who has indicated his/her understanding and acceptance.       Plan Discussed with: CRNA and Anesthesiologist  Anesthesia Plan Comments:        Anesthesia Quick Evaluation

## 2019-09-30 ENCOUNTER — Ambulatory Visit (HOSPITAL_COMMUNITY)
Admission: RE | Admit: 2019-09-30 | Discharge: 2019-09-30 | Disposition: A | Discharge status: Home / Self Care | Payer: Medicare Other | Attending: Cardiovascular Disease | Admitting: Cardiovascular Disease

## 2019-09-30 ENCOUNTER — Ambulatory Visit (HOSPITAL_COMMUNITY): Payer: Medicare Other | Admitting: Anesthesiology

## 2019-09-30 ENCOUNTER — Other Ambulatory Visit: Payer: Self-pay

## 2019-09-30 ENCOUNTER — Encounter (HOSPITAL_COMMUNITY)
Admission: RE | Disposition: A | Discharge status: Home / Self Care | Payer: Self-pay | Source: Home / Self Care | Attending: Cardiovascular Disease

## 2019-09-30 ENCOUNTER — Encounter (HOSPITAL_COMMUNITY): Payer: Self-pay | Admitting: Emergency Medicine

## 2019-09-30 DIAGNOSIS — M109 Gout, unspecified: Secondary | ICD-10-CM | POA: Diagnosis not present

## 2019-09-30 DIAGNOSIS — I6523 Occlusion and stenosis of bilateral carotid arteries: Secondary | ICD-10-CM | POA: Diagnosis not present

## 2019-09-30 DIAGNOSIS — I4819 Other persistent atrial fibrillation: Secondary | ICD-10-CM | POA: Diagnosis not present

## 2019-09-30 DIAGNOSIS — I130 Hypertensive heart and chronic kidney disease with heart failure and stage 1 through stage 4 chronic kidney disease, or unspecified chronic kidney disease: Secondary | ICD-10-CM | POA: Insufficient documentation

## 2019-09-30 DIAGNOSIS — Z95810 Presence of automatic (implantable) cardiac defibrillator: Secondary | ICD-10-CM | POA: Insufficient documentation

## 2019-09-30 DIAGNOSIS — I671 Cerebral aneurysm, nonruptured: Secondary | ICD-10-CM | POA: Diagnosis not present

## 2019-09-30 DIAGNOSIS — I25810 Atherosclerosis of coronary artery bypass graft(s) without angina pectoris: Secondary | ICD-10-CM | POA: Diagnosis not present

## 2019-09-30 DIAGNOSIS — I5042 Chronic combined systolic (congestive) and diastolic (congestive) heart failure: Secondary | ICD-10-CM | POA: Insufficient documentation

## 2019-09-30 DIAGNOSIS — F17290 Nicotine dependence, other tobacco product, uncomplicated: Secondary | ICD-10-CM | POA: Diagnosis not present

## 2019-09-30 DIAGNOSIS — Z8249 Family history of ischemic heart disease and other diseases of the circulatory system: Secondary | ICD-10-CM | POA: Insufficient documentation

## 2019-09-30 DIAGNOSIS — I350 Nonrheumatic aortic (valve) stenosis: Secondary | ICD-10-CM | POA: Diagnosis not present

## 2019-09-30 DIAGNOSIS — Z7901 Long term (current) use of anticoagulants: Secondary | ICD-10-CM | POA: Diagnosis not present

## 2019-09-30 DIAGNOSIS — E785 Hyperlipidemia, unspecified: Secondary | ICD-10-CM | POA: Diagnosis not present

## 2019-09-30 DIAGNOSIS — Z791 Long term (current) use of non-steroidal anti-inflammatories (NSAID): Secondary | ICD-10-CM | POA: Insufficient documentation

## 2019-09-30 DIAGNOSIS — D500 Iron deficiency anemia secondary to blood loss (chronic): Secondary | ICD-10-CM | POA: Insufficient documentation

## 2019-09-30 DIAGNOSIS — I252 Old myocardial infarction: Secondary | ICD-10-CM | POA: Insufficient documentation

## 2019-09-30 DIAGNOSIS — Z953 Presence of xenogenic heart valve: Secondary | ICD-10-CM | POA: Insufficient documentation

## 2019-09-30 DIAGNOSIS — I712 Thoracic aortic aneurysm, without rupture: Secondary | ICD-10-CM | POA: Insufficient documentation

## 2019-09-30 DIAGNOSIS — I255 Ischemic cardiomyopathy: Secondary | ICD-10-CM | POA: Diagnosis not present

## 2019-09-30 DIAGNOSIS — E039 Hypothyroidism, unspecified: Secondary | ICD-10-CM | POA: Insufficient documentation

## 2019-09-30 DIAGNOSIS — I739 Peripheral vascular disease, unspecified: Secondary | ICD-10-CM | POA: Insufficient documentation

## 2019-09-30 DIAGNOSIS — N183 Chronic kidney disease, stage 3 unspecified: Secondary | ICD-10-CM | POA: Insufficient documentation

## 2019-09-30 DIAGNOSIS — F419 Anxiety disorder, unspecified: Secondary | ICD-10-CM | POA: Insufficient documentation

## 2019-09-30 DIAGNOSIS — Z79899 Other long term (current) drug therapy: Secondary | ICD-10-CM | POA: Insufficient documentation

## 2019-09-30 DIAGNOSIS — M1990 Unspecified osteoarthritis, unspecified site: Secondary | ICD-10-CM | POA: Insufficient documentation

## 2019-09-30 DIAGNOSIS — I480 Paroxysmal atrial fibrillation: Secondary | ICD-10-CM

## 2019-09-30 DIAGNOSIS — K219 Gastro-esophageal reflux disease without esophagitis: Secondary | ICD-10-CM | POA: Diagnosis not present

## 2019-09-30 HISTORY — PX: CARDIOVERSION: SHX1299

## 2019-09-30 SURGERY — CARDIOVERSION
Anesthesia: General

## 2019-09-30 MED ORDER — LIDOCAINE 2% (20 MG/ML) 5 ML SYRINGE
INTRAMUSCULAR | Status: DC | PRN
  Administered 2019-09-30: 60 mg via INTRAVENOUS

## 2019-09-30 MED ORDER — SODIUM CHLORIDE 0.9% FLUSH: 3.0000 mL | INTRAVENOUS | Status: DC | PRN

## 2019-09-30 MED ORDER — SODIUM CHLORIDE 0.9 % IV SOLN
INTRAVENOUS | Status: DC | PRN
  Administered 2019-09-30: 08:00:00 via INTRAVENOUS

## 2019-09-30 MED ORDER — SODIUM CHLORIDE 0.9 % IV SOLN: 250.0000 mL | INTRAVENOUS | Status: DC

## 2019-09-30 MED ORDER — PHENYLEPHRINE 40 MCG/ML (10ML) SYRINGE FOR IV PUSH (FOR BLOOD PRESSURE SUPPORT)
PREFILLED_SYRINGE | INTRAVENOUS | Status: DC | PRN
  Administered 2019-09-30: 80 ug via INTRAVENOUS

## 2019-09-30 MED ORDER — PROPOFOL 10 MG/ML IV BOLUS
INTRAVENOUS | Status: DC | PRN
  Administered 2019-09-30: 90 mg via INTRAVENOUS

## 2019-09-30 NOTE — Anesthesia Postprocedure Evaluation (Signed)
Anesthesia Post Note  Patient: [name]  Procedure(s) Performed: CARDIOVERSION (N/A )     Patient location during evaluation: PACU Anesthesia Type: General Level of consciousness: awake and alert Pain management: pain level controlled Vital Signs Assessment: post-procedure vital signs reviewed and stable Respiratory status: spontaneous breathing, nonlabored ventilation and respiratory function stable Cardiovascular status: blood pressure returned to baseline and stable Postop Assessment: no apparent nausea or vomiting Anesthetic complications: no    Last Vitals:  Vitals:   09/30/19 0900 09/30/19 0910  BP: (!) 120/56 (!) 125/57  Pulse: (!) 59 60  Resp: (!) 24 11  Temp:    SpO2: 98% 100%    Last Pain:  Vitals:   09/30/19 0845  TempSrc: Temporal  PainSc: 0-No pain                 Audry Pili

## 2019-09-30 NOTE — Discharge Instructions (Signed)

## 2019-09-30 NOTE — Interval H&P Note (Signed)
History and Physical Interval Note:  09/30/2019 8:52 AM  [name]  has presented today for surgery, with the diagnosis of ATRIAL FIBRILLATION.  The various methods of treatment have been discussed with the patient and family. After consideration of risks, benefits and other options for treatment, the patient has consented to  Procedure(s): CARDIOVERSION (N/A) as a surgical intervention.  The patient's history has been reviewed, patient examined, no change in status, stable for surgery.  I have reviewed the patient's chart and labs.  Questions were answered to the patient's satisfaction.     Anylah Scheib

## 2019-09-30 NOTE — Op Note (Signed)
Procedure: Electrical Cardioversion Indications:  Atrial Fibrillation  Procedure Details:  Consent: Risks of procedure as well as the alternatives and risks of each were explained to the (patient/caregiver).  Consent for procedure obtained.  Time Out: Verified patient identification, verified procedure, site/side was marked, verified correct patient position, special equipment/implants available, medications/allergies/relevent history reviewed, required imaging and test results available.  Performed  Patient placed on cardiac monitor, pulse oximetry, supplemental oxygen as necessary.  Sedation given: propofol 90 mg IV, Dr. Fransisco Beau Pacer pads placed anterior and posterior chest.  Cardioverted 1 time(s).  Cardioversion with synchronized biphasic 120J shock.  Evaluation: Findings: Post procedure EKG shows: A paced BiV paced Complications: None Patient did tolerate procedure well.  Comprehensive CRT-D check after the procedure shows normal device function and lead parameters.  Time Spent Directly with the Patient:  30 minutes   [name] 09/30/2019, 8:49 AM

## 2019-09-30 NOTE — Transfer of Care (Signed)
Immediate Anesthesia Transfer of Care Note  Patient: [name]  Procedure(s) Performed: CARDIOVERSION (N/A )  Patient Location: Endoscopy Unit  Anesthesia Type:General  Level of Consciousness: drowsy  Airway & Oxygen Therapy: Patient Spontanous Breathing and Patient connected to nasal cannula oxygen  Post-op Assessment: Report given to RN  Post vital signs: Reviewed and stable  Last Vitals:  Vitals Value Taken Time  BP    Temp    Pulse    Resp    SpO2      Last Pain:  Vitals:   09/30/19 0808  TempSrc: Temporal  PainSc: 0-No pain         Complications: No apparent anesthesia complications

## 2019-09-30 NOTE — Anesthesia Procedure Notes (Signed)
Procedure Name: General with mask airway Date/Time: 09/30/2019 8:31 AM Performed by: Orlie Dakin, CRNA Pre-anesthesia Checklist: Patient identified, Emergency Drugs available, Patient being monitored and Suction available Patient Re-evaluated:Patient Re-evaluated prior to induction Oxygen Delivery Method: Ambu bag Preoxygenation: Pre-oxygenation with 100% oxygen Induction Type: IV induction

## 2019-10-05 ENCOUNTER — Telehealth: Payer: Self-pay

## 2019-10-05 ENCOUNTER — Other Ambulatory Visit: Payer: Self-pay | Admitting: Cardiovascular Disease

## 2019-10-05 NOTE — Telephone Encounter (Signed)
-----   Message from Ricci Barker, RN sent at 10/04/2019  2:08 PM EDT ----- Regarding: 2 week download-post cardioversion Hello, just a fyi, this patient will be doing a download on 10/23 for a 2 week post cardioversion. Thank you

## 2019-10-05 NOTE — Telephone Encounter (Signed)
LMOVM that the pt needs to send a manual transmission with his home monitor on 10/14/2019 per Dr. Loletha Grayer.

## 2019-10-07 ENCOUNTER — Encounter (INDEPENDENT_AMBULATORY_CARE_PROVIDER_SITE_OTHER): Payer: Self-pay | Admitting: Ophthalmology

## 2019-10-07 ENCOUNTER — Ambulatory Visit (INDEPENDENT_AMBULATORY_CARE_PROVIDER_SITE_OTHER): Payer: Medicare Other | Admitting: Ophthalmology

## 2019-10-07 ENCOUNTER — Other Ambulatory Visit: Payer: Self-pay

## 2019-10-07 DIAGNOSIS — H3581 Retinal edema: Secondary | ICD-10-CM | POA: Diagnosis not present

## 2019-10-07 DIAGNOSIS — I10 Essential (primary) hypertension: Secondary | ICD-10-CM | POA: Diagnosis not present

## 2019-10-07 DIAGNOSIS — H2101 Hyphema, right eye: Secondary | ICD-10-CM

## 2019-10-07 DIAGNOSIS — H4311 Vitreous hemorrhage, right eye: Secondary | ICD-10-CM

## 2019-10-07 DIAGNOSIS — H35033 Hypertensive retinopathy, bilateral: Secondary | ICD-10-CM

## 2019-10-07 DIAGNOSIS — Z961 Presence of intraocular lens: Secondary | ICD-10-CM

## 2019-10-07 MED ORDER — PREDNISOLONE ACETATE 1 % OP SUSP: 1.0000 [drp] | Freq: Four times a day (QID) | OPHTHALMIC | 0 refills | Status: DC

## 2019-10-07 NOTE — Progress Notes (Addendum)
[facility] - Clinic Note  10/07/2019     CHIEF COMPLAINT Patient presents for Retina Evaluation   HISTORY OF PRESENT ILLNESS: [name] is a 78 y.o. male who presents to the clinic today for:   HPI    Retina Evaluation    In right eye.  This started 2 days ago.  Duration of 2 days.  Associated Symptoms Pain, Redness and Photophobia.  Negative for Flashes, Distortion, Floaters, Blind Spot, Glare, Scalp Tenderness, Shoulder/Hip pain, Weight Loss, Trauma, Jaw Claudication, Fever and Fatigue.  Context:  distance vision, near vision, reading, mid-range vision, watching TV and driving.  Treatments tried include no treatments.  I, the attending physician,  performed the HPI with the patient and updated documentation appropriately.          Comments    Patient states vision OD started to get blurry about 2 weeks ago and patient attributed vision loss to cataract development. Then 2 days ago, had severe vision loss OD. Patient denies floaters and flashes. Patient reports OD is red, sensitive to light. Has some discharge OD. Some itching OD as well. Only wears OTC readers for up-close. Last eye exam was several years ago. Patient has history of heart problems (mild heart attack 37 years ago, has pacemaker). No history of stroke. On eliquis.       Last edited by Rennis Chris, MD on 10/07/2019  9:53 AM. (History)    pt states about a week or 2 ago he noticed his vision was getting cloudy, he states he thought it was a cataract bc hes had cataracts before, he states about 2 days ago his vision got "milky", he states it seems to start in the morning and get better through out the day, pt had a cardioversion on October 9, pt is on eliquis and was told by his cardiologist not to stop it or miss a dose  Referring physician: No referring provider defined for this encounter.  HISTORICAL INFORMATION:   Selected notes from the MEDICAL RECORD NUMBER    CURRENT  MEDICATIONS: Current Outpatient Medications (Ophthalmic Drugs)  Medication Sig  . prednisoLONE acetate (PRED FORTE) 1 % ophthalmic suspension Place 1 drop into the right eye 4 (four) times daily.   No current facility-administered medications for this visit.  (Ophthalmic Drugs)   Current Outpatient Medications (Other)  Medication Sig  . amiodarone (PACERONE) 200 MG tablet Take 1 tablet (200 mg total) by mouth daily.  [name] apixaban (ELIQUIS) 5 MG TABS tablet Take 1 tablet (5 mg total) by mouth 2 (two) times daily.  [name] atorvastatin (LIPITOR) 10 MG tablet TAKE 1 TABLET BY MOUTH EVERY DAY  . carvedilol (COREG) 6.25 MG tablet Take 1 tablet (6.25 mg total) by mouth 2 (two) times daily with a meal.  . colchicine 0.6 MG tablet TAKE 1 TABLET (0.6 MG TOTAL) BY MOUTH AS NEEDED (GOUT ATTACKS).  . ferrous sulfate 325 (65 FE) MG EC tablet Take 1 tablet (325 mg total) by mouth 2 (two) times daily.  . folic acid (FOLVITE) 1 MG tablet Take 1 tablet (1 mg total) by mouth daily.  . furosemide (LASIX) 40 MG tablet Take 1 tablet (40 mg total) by mouth 2 (two) times daily.  . Menthol, Topical Analgesic, (BENGAY EX) Apply 1 application topically daily as needed (pain).  . nitroGLYCERIN (NITROSTAT) 0.4 MG SL tablet Place 1 tablet (0.4 mg) under the tongue every 5 minutes as needed for chest pain for up to 3 doses/call 9-1-1  and MD if no relief  . potassium chloride (KLOR-CON M10) 10 MEQ tablet Take 1 tablet (10 mEq total) by mouth daily.   Current Facility-Administered Medications (Other)  Medication Route  . sodium chloride flush (NS) 0.9 % injection 3 mL Intravenous      REVIEW OF SYSTEMS: ROS    Positive for: Cardiovascular, Eyes   Negative for: Constitutional, Gastrointestinal, Neurological, Skin, Genitourinary, Musculoskeletal, HENT, Endocrine, Respiratory, Psychiatric, Allergic/Imm, Heme/Lymph   Last edited by Annalee Genta D, COT on 10/07/2019  9:02 AM. (History)       ALLERGIES Allergies  Allergen  Reactions  . Atorvastatin Other (See Comments)    Joint pain (patient takes this, however)  . Other Other (See Comments)    Patient is sodium-restricted, per his admission    PAST MEDICAL HISTORY Past Medical History:  Diagnosis Date  . Anginal pain (HCC)   . Anxiety   . Aortic stenosis    a. s/p bioprosthetic AVR in 2010  . Arthritis    gout  . CAD (coronary artery disease)    a. s/p CABG in 1992 b. redo CABG in 2004 c. cath in 2010 showing patent LIMA-LAD, free RIMA-OM, and SVG-OM, and 80% stenosis of small PLA branch  . Chest pain   . CHF (congestive heart failure) (HCC)   . Dysrhythmia   . GERD (gastroesophageal reflux disease)    with gas and bloating probably cause of chest pain  . H/O atrial flutter    a. s/p ablation and not on anticoagulation secondary to GIB and alcohol abuse.   . H/O: gout   . Hyperlipidemia   . Hypertension   . ICD (implantable cardiac defibrillator) infection, s/p removal of ICD 07/12/2012   re-implanted Medtronic BiV ICD, serial number [id number]   . Ischemic cardiomyopathy    a. EF previously 20-25%, improved to 40-45% by echo in 03/2013 b. 10/2017: echo showing of 30-35% with Grade 2 DD, normal function of AVR, moderate MR.   . Myocardial infarction (HCC)   . 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 Shortness of breath    Past Surgical History:  Procedure Laterality Date  . BI-VENTRICULAR IMPLANTABLE CARDIOVERTER DEFIBRILLATOR Right 08/18/2012   Medtronic BiV ICD, serial number [id number] ; Procedure: BI-VENTRICULAR IMPLANTABLE CARDIOVERTER DEFIBRILLATOR  (CRT-D);  Surgeon: Marinus Maw, MD;  Location: [facility] CATH LAB;  Service: Cardiovascular;  Laterality: Right;  . BIV ICD GENERATOR CHANGEOUT N/A 09/08/2018   Procedure: BIV ICD GENERATOR CHANGEOUT;  Surgeon: Thurmon Fair, MD;  Location: [facility] INVASIVE CV LAB;  Service: Cardiovascular;  Laterality: N/A;  . CARDIAC CATHETERIZATION  Dec 18, 2003   with a  presentation of atrial flutter with rapid ventricular response. He had a cath and two stents to his PDA after his SVG to his RCA. His other graft were patent. He had LV with EF of 30%.   . CARDIOVERSION N/A 05/30/2019   Procedure: CARDIOVERSION;  Surgeon: Thurmon Fair, MD;  Location: [facility] ENDOSCOPY;  Service: Cardiovascular;  Laterality: N/A;  . CARDIOVERSION N/A 09/30/2019   Procedure: CARDIOVERSION;  Surgeon: Thurmon Fair, MD;  Location: [facility] ENDOSCOPY;  Service: Cardiovascular;  Laterality: N/A;  . CAROTID ENDARTERECTOMY  11/2002   left in December 2003 and known renal artery stenosis of 60-70%  . CORONARY ARTERY BYPASS GRAFT  1993   redo in 2000, he has had multiple MI previous to both procedures.  . IMPLANTABLE CARDIOVERTER DEFIBRILLATOR (ICD) GENERATOR CHANGE N/A 06/25/2012   Procedure:  ICD GENERATOR CHANGE;  Surgeon: Marinus MawGregg W Taylor, MD;  [facility];  Service: Cardiovascular;  Laterality: N/A;  . VALVE REPLACEMENT  2010    FAMILY HISTORY Family History  Problem Relation Age of Onset  . CAD Brother     SOCIAL HISTORY Social History   Tobacco Use  . Smoking status: Never Smoker  . Smokeless tobacco: Current User    Types: Chew  Substance Use Topics  . Alcohol use: Yes    Alcohol/week: 14.0 standard drinks    Types: 14 Cans of beer per week  . Drug use: No         OPHTHALMIC EXAM:  Base Eye Exam    Visual Acuity (Snellen - Linear)      Right Left   Dist [place] HM 20/25 -1   Dist ph [place] NI 20/25 +2       Tonometry (Tonopen, 9:19 AM)      Right Left   Pressure 23 11       Tonometry #2 (Tonopen, 9:19 AM)      Right Left   Pressure 19 11       Pupils      Dark Light Shape React APD   Right 2 2 Round none +2   Left 2 1 Round Slow None       Visual Fields (Counting fingers)      Left Right    Full    Restrictions  Total superior temporal, inferior temporal, superior nasal, inferior nasal deficiencies       Extraocular Movement      Right Left     Full, Ortho Full, Ortho       Dilation    Both eyes: 1.0% Mydriacyl, 2.5% Phenylephrine @ 9:19 AM        Slit Lamp and Fundus Exam    Slit Lamp Exam      Right Left   Lids/Lashes Dermatochalasis - upper lid, Meibomian gland dysfunction Dermatochalasis - upper lid, Meibomian gland dysfunction   Conjunctiva/Sclera Mild, temporal Pinguecula, white and quiet Mild, temporal Pinguecula, white and quiet   Cornea 2+ Punctate epithelial erosions 1+ Punctate epithelial erosions, tear film debris   Anterior Chamber Deep, 4+RBC less than 1mm layered hypehema Deep and quiet   Iris Round and very poorly dilated to 2mm Round and poorly dilated to 4mm   Lens Posterior chamber intraocular lens, ?mild pseudophakodenesis Posterior chamber intraocular lens   Vitreous diffuse red VH Mild Vitreous syneresis       Fundus Exam      Right Left   Disc no view Pink and Sharp   C/D Ratio  0.2   Macula no view Flat, Good foveal reflex, No heme or edema   Vessels no view milid Vascular attenuation   Periphery no view Attached           Refraction    Manifest Refraction      Sphere Cylinder Dist VA   Right unable     Left -0.50 Sphere 20/25          IMAGING AND PROCEDURES  Imaging and Procedures for @TODAY @  OCT, Retina - OU - Both Eyes       Left Eye Quality was good. Central Foveal Thickness: 288. Progression has no prior data. Findings include normal foveal contour, no IRF, no SRF, vitreomacular adhesion .   Notes *Images captured and stored on drive  Diagnosis / Impression:  OD: no view OS: NFP, no IRF/SRF; +VMA  Clinical management:  See below  Abbreviations: NFP - Normal foveal profile. CME - cystoid macular edema. PED - pigment epithelial detachment. IRF - intraretinal fluid. SRF - subretinal fluid. EZ - ellipsoid zone. ERM - epiretinal membrane. ORA - outer retinal atrophy. ORT - outer retinal tubulation. SRHM - subretinal hyper-reflective material        B-Scan Ultrasound -  OD - Right Eye       Quality was good. Findings included vitreous hemorrhage, vitreous opacities.   Notes **Images stored on drive**  Impression: OD: vitreous opacities consistent with hemorrhage; no obvious RT/RD or mass                 ASSESSMENT/PLAN:    ICD-10-CM   1. Hyphema, right eye  H21.01 B-Scan Ultrasound - OD - Right Eye  2. Vitreous hemorrhage, right eye (HCC)  H43.11 B-Scan Ultrasound - OD - Right Eye  3. Retinal edema  H35.81 OCT, Retina - OU - Both Eyes  4. Essential hypertension  I10   5. Hypertensive retinopathy of both eyes  H35.033   6. Pseudophakia of both eyes  Z96.1     1-3. 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 Hypertensive retinopathy OU - discussed importance of tight BP control - monitor  6. Pseudophakia OU  - s/p CE/IOL OU  - monitor   Ophthalmic Meds Ordered this visit:  Meds ordered this encounter  Medications  . prednisoLONE acetate (PRED FORTE) 1 % ophthalmic suspension    Sig: Place 1 drop into the right eye 4 (four) times daily.    Dispense:  15 mL    Refill:  0       Return in about 4 days (around 10/11/2019) for f/u 4 days VH OD, DFE, OCT, OPTOS (colors).  There are no Patient Instructions  on file for this visit.   Explained the diagnoses, plan, and follow up with the patient and they expressed understanding.  Patient expressed understanding of the importance of proper follow up care.   This document serves as a record of services personally performed by Gardiner Sleeper, MD, PhD. It was created on their behalf by Ernest Mallick, OA, an ophthalmic assistant. The creation of this record is the provider's dictation and/or activities during the visit.    Electronically signed by: Ernest Mallick, OA 10.16.2020 9:13 PM   Gardiner Sleeper, M.D., Ph.D. Diseases & Surgery of the Retina and Vitreous [facility]  I have reviewed the above documentation for accuracy and completeness, and I agree with the above. Gardiner Sleeper, M.D., Ph.D. 10/09/19 9:13 PM   Abbreviations: M myopia (nearsighted); A astigmatism; H hyperopia (farsighted); P presbyopia; Mrx spectacle prescription;  CTL contact lenses;  OD right eye; OS left eye; OU both eyes  XT exotropia; ET esotropia; PEK punctate epithelial keratitis; PEE punctate epithelial erosions; DES dry eye syndrome; MGD meibomian gland dysfunction; ATs artificial tears; PFAT's preservative free artificial tears; NSC nuclear sclerotic cataract; PSC posterior subcapsular cataract; ERM epi-retinal membrane; PVD posterior vitreous detachment; RD retinal detachment; DM diabetes mellitus; DR diabetic retinopathy; NPDR non-proliferative diabetic retinopathy; PDR proliferative diabetic retinopathy; CSME clinically significant macular edema; DME diabetic macular edema; dbh dot blot hemorrhages; CWS cotton wool spot; POAG primary open angle glaucoma; C/D cup-to-disc ratio; HVF humphrey visual field; GVF goldmann visual field; OCT optical coherence tomography; IOP intraocular pressure; BRVO Branch retinal vein occlusion; CRVO central retinal vein occlusion; CRAO central retinal artery occlusion; BRAO branch retinal artery occlusion; RT retinal tear;  SB scleral buckle; PPV pars plana vitrectomy; VH Vitreous hemorrhage; PRP panretinal laser photocoagulation; IVK intravitreal kenalog; VMT vitreomacular traction; MH Macular hole;  NVD neovascularization of the disc; NVE neovascularization elsewhere; AREDS age related eye disease study; ARMD age related macular degeneration; POAG primary open angle glaucoma; EBMD epithelial/anterior basement membrane dystrophy; ACIOL anterior chamber intraocular lens; IOL intraocular lens; PCIOL posterior chamber intraocular lens; Phaco/IOL phacoemulsification with intraocular lens placement; PRK photorefractive keratectomy; LASIK laser assisted in situ keratomileusis; HTN hypertension; DM diabetes mellitus; COPD chronic obstructive pulmonary disease

## 2019-10-07 NOTE — Progress Notes (Signed)
[facility] Note  10/11/2019     CHIEF COMPLAINT Patient presents for Retina Follow Up   HISTORY OF PRESENT ILLNESS: [name] is a 78 y.o. male who presents to the clinic today for:   HPI    Retina Follow Up    Patient presents with  Other.  In right eye.  This started 4 days ago.  Severity is severe.  Duration of 4 days.  Since onset it is stable.  I, the attending physician,  performed the HPI with the patient and updated documentation appropriately.          Comments    78 y/o male pt here for 4 day f/u for hyphema OD.  No change in [place] OU.  Denies pain, flashes, floaters.  "Pink top" QID OD "Blue Top" BID OD "Red Top: BID OD       Last edited by Bernarda Caffey, MD on 10/11/2019  9:38 AM. (History)    pt states he feels like his vision is not as "milky" as it was on Friday   Referring physician: No referring provider defined for this encounter.  HISTORICAL INFORMATION:   Selected notes from the MEDICAL RECORD NUMBER    CURRENT MEDICATIONS: Current Outpatient Medications (Ophthalmic Drugs)  Medication Sig  . prednisoLONE acetate (PRED FORTE) 1 % ophthalmic suspension Place 1 drop into the right eye 4 (four) times daily.   No current facility-administered medications for this visit.  (Ophthalmic Drugs)   Current Outpatient Medications (Other)  Medication Sig  . amiodarone (PACERONE) 200 MG tablet Take 1 tablet (200 mg total) by mouth daily.  [name] apixaban (ELIQUIS) 5 MG TABS tablet Take 1 tablet (5 mg total) by mouth 2 (two) times daily.  [name] atorvastatin (LIPITOR) 10 MG tablet TAKE 1 TABLET BY MOUTH EVERY DAY  . carvedilol (COREG) 6.25 MG tablet Take 1 tablet (6.25 mg total) by mouth 2 (two) times daily with a meal.  . colchicine 0.6 MG tablet TAKE 1 TABLET (0.6 MG TOTAL) BY MOUTH AS NEEDED (GOUT ATTACKS).  . ferrous sulfate 325 (65 FE) MG EC tablet Take 1 tablet (325 mg total) by mouth 2 (two) times daily.  . folic acid  (FOLVITE) 1 MG tablet Take 1 tablet (1 mg total) by mouth daily.  . furosemide (LASIX) 40 MG tablet Take 1 tablet (40 mg total) by mouth 2 (two) times daily.  . Menthol, Topical Analgesic, (BENGAY EX) Apply 1 application topically daily as needed (pain).  . nitroGLYCERIN (NITROSTAT) 0.4 MG SL tablet Place 1 tablet (0.4 mg) under the tongue every 5 minutes as needed for chest pain for up to 3 doses/call 9-1-1 and MD if no relief  . potassium chloride (KLOR-CON M10) 10 MEQ tablet Take 1 tablet (10 mEq total) by mouth daily.   Current Facility-Administered Medications (Other)  Medication Route  . sodium chloride flush (NS) 0.9 % injection 3 mL Intravenous      REVIEW OF SYSTEMS: ROS    Positive for: Genitourinary, Cardiovascular, Eyes   Negative for: Constitutional, Gastrointestinal, Neurological, Skin, Musculoskeletal, HENT, Endocrine, Respiratory, Psychiatric, Allergic/Imm, Heme/Lymph   Last edited by Matthew Folks, COA on 10/11/2019  8:56 AM. (History)       ALLERGIES Allergies  Allergen Reactions  . Atorvastatin Other (See Comments)    Joint pain (patient takes this, however)  . Other Other (See Comments)    Patient is sodium-restricted, per his admission    PAST MEDICAL HISTORY Past Medical  History:  Diagnosis Date  . Anginal pain (HCC)   . Anxiety   . Aortic stenosis    a. s/p bioprosthetic AVR in 2010  . Arthritis    gout  . CAD (coronary artery disease)    a. s/p CABG in 1992 b. redo CABG in 2004 c. cath in 2010 showing patent LIMA-LAD, free RIMA-OM, and SVG-OM, and 80% stenosis of small PLA branch  . Chest pain   . CHF (congestive heart failure) (HCC)   . Dysrhythmia   . GERD (gastroesophageal reflux disease)    with gas and bloating probably cause of chest pain  . H/O atrial flutter    a. s/p ablation and not on anticoagulation secondary to GIB and alcohol abuse.   . H/O: gout   . Hyperlipidemia   . Hypertension   . ICD (implantable cardiac defibrillator)  infection, s/p removal of ICD 07/12/2012   re-implanted Medtronic BiV ICD, serial number [id number]   . Ischemic cardiomyopathy    a. EF previously 20-25%, improved to 40-45% by echo in 03/2013 b. 10/2017: echo showing of 30-35% with Grade 2 DD, normal function of AVR, moderate MR.   . Myocardial infarction (HCC)   . 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 Shortness of breath    Past Surgical History:  Procedure Laterality Date  . BI-VENTRICULAR IMPLANTABLE CARDIOVERTER DEFIBRILLATOR Right 08/18/2012   Medtronic BiV ICD, serial number [id number] ; Procedure: BI-VENTRICULAR IMPLANTABLE CARDIOVERTER DEFIBRILLATOR  (CRT-D);  Surgeon: Marinus MawGregg W Taylor, MD;  [facility];  Service: Cardiovascular;  Laterality: Right;  . BIV ICD GENERATOR CHANGEOUT N/A 09/08/2018   Procedure: BIV ICD GENERATOR CHANGEOUT;  Surgeon: Thurmon Fairroitoru, Mihai, MD;  Location: [facility] INVASIVE CV LAB;  Service: Cardiovascular;  Laterality: N/A;  . CARDIAC CATHETERIZATION  Dec 18, 2003   with a presentation of atrial flutter with rapid ventricular response. He had a cath and two stents to his PDA after his SVG to his RCA. His other graft were patent. He had LV with EF of 30%.   . CARDIOVERSION N/A 05/30/2019   Procedure: CARDIOVERSION;  Surgeon: Thurmon Fairroitoru, Mihai, MD;  Location: [facility] ENDOSCOPY;  Service: Cardiovascular;  Laterality: N/A;  . CARDIOVERSION N/A 09/30/2019   Procedure: CARDIOVERSION;  Surgeon: Thurmon Fairroitoru, Mihai, MD;  Location: [facility] ENDOSCOPY;  Service: Cardiovascular;  Laterality: N/A;  . CAROTID ENDARTERECTOMY  11/2002   left in December 2003 and known renal artery stenosis of 60-70%  . CORONARY ARTERY BYPASS GRAFT  1993   redo in 2000, he has had multiple MI previous to both procedures.  . IMPLANTABLE CARDIOVERTER DEFIBRILLATOR (ICD) GENERATOR CHANGE N/A 06/25/2012   Procedure: ICD GENERATOR CHANGE;  Surgeon: Marinus MawGregg W Taylor, MD;  [facility];  Service: Cardiovascular;   Laterality: N/A;  . VALVE REPLACEMENT  2010    FAMILY HISTORY Family History  Problem Relation Age of Onset  . CAD Brother     SOCIAL HISTORY Social History   Tobacco Use  . Smoking status: Never Smoker  . Smokeless tobacco: Current User    Types: Chew  Substance Use Topics  . Alcohol use: Yes    Alcohol/week: 14.0 standard drinks    Types: 14 Cans of beer per week  . Drug use: No         OPHTHALMIC EXAM:  Base Eye Exam    Visual Acuity (Snellen - Linear)      Right Left   Dist [place] HM 20/25  Dist ph [place] NI NI       Tonometry (Tonopen, 8:58 AM)      Right Left   Pressure 14 11       Pupils      Dark Light Shape React APD   Right 2 2 Round Minimal +1   Left 2 1 Round Slow None       Visual Fields (Counting fingers)      Left Right    Full    Restrictions  Total superior temporal, inferior temporal, superior nasal, inferior nasal deficiencies       Extraocular Movement      Right Left    Full, Ortho Full, Ortho       Neuro/Psych    Oriented x3: Yes   Mood/Affect: Normal       Dilation    Both eyes: 1.0% Mydriacyl, 2.5% Phenylephrine @ 8:59 AM        Slit Lamp and Fundus Exam    Slit Lamp Exam      Right Left   Lids/Lashes Dermatochalasis - upper lid, Meibomian gland dysfunction Dermatochalasis - upper lid, Meibomian gland dysfunction   Conjunctiva/Sclera Mild, temporal Pinguecula, white and quiet Mild, temporal Pinguecula, white and quiet   Cornea 2+ Punctate epithelial erosions 2+ Punctate epithelial erosions, tear film debris   Anterior Chamber Deep, 4+RBC, 101mm layered hypehema -- increased from prior Deep and quiet   Iris Round and very poorly dilated to 2.25mm Round and poorly dilated to 19mm   Lens Posterior chamber intraocular lens, ?mild pseudophakodenesis, red heme overlying pupil Posterior chamber intraocular lens   Vitreous diffuse red VH Mild Vitreous syneresis       Fundus Exam      Right Left   Disc no view Pink and Sharp   C/D  Ratio  0.2   Macula no view Flat, Good foveal reflex, No heme or edema   Vessels no view milid Vascular attenuation   Periphery no view Attached             IMAGING AND PROCEDURES  Imaging and Procedures for @TODAY @  OCT, Retina - OU - Both Eyes       Left Eye Quality was good. Central Foveal Thickness: 296. Progression has been stable. Findings include normal foveal contour, no IRF, no SRF, vitreomacular adhesion .   Notes *Images captured and stored on drive  Diagnosis / Impression:  OD: no view OS: NFP, no IRF/SRF; +VMA  Clinical management:  See below  Abbreviations: NFP - Normal foveal profile. CME - cystoid macular edema. PED - pigment epithelial detachment. IRF - intraretinal fluid. SRF - subretinal fluid. EZ - ellipsoid zone. ERM - epiretinal membrane. ORA - outer retinal atrophy. ORT - outer retinal tubulation. SRHM - subretinal hyper-reflective material        B-Scan Ultrasound - OD - Right Eye       Quality was good. Findings included vitreous hemorrhage, vitreous opacities.   Notes **Images stored on drive**  Impression: OD: vitreous opacities consistent with hemorrhage -- mostly anterior; no obvious RT/RD or mass                 ASSESSMENT/PLAN:    ICD-10-CM   1. Retinal edema  H35.81 OCT, Retina - OU - Both Eyes  2. Vitreous hemorrhage, right eye (HCC)  H43.11 B-Scan Ultrasound - OD - Right Eye  3. Hyphema, right eye  H21.01 B-Scan Ultrasound - OD - Right Eye  4. Essential hypertension  I10  5. Hypertensive retinopathy of both eyes  H35.033   6. Pseudophakia of both eyes  Z96.1     1-3. Vitreous hemorrhage w/ layered hyphema OD  - pt with significant cardiac history and recent cardioversion on Eliquis   - presents with 2 wk history of decreased vision OD -- was intermittent initially, but 2 days ago, became stably poor without improvement  - exam shows poor dilation, layered hyphema and vitreous hemorrhage OD; mild pseudophakodonesis  -- ?UGH syndrome  - hyphema today slightly increased to 1mm from <49mm last Friday  - no view of posterior pole due to heme--repeat b-scan 10.20.20 shows +vit opacities/heme, no obvious RD or mass -- majority of vit opacities are anterior close to IOL  - pt denies trauma or fall; suspect VH related to Eliquis use + 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 Hypertensive retinopathy OU  - discussed importance of tight BP control  - monitor  6. Pseudophakia OU  - s/p CE/IOL OU  - monitor   Ophthalmic Meds Ordered this visit:  No orders of the defined types were placed in this encounter.      Return in about 2 days (around 10/13/2019) for f/u vitreous heme with layered hyphema OD, DFE, OCT (8:00am).  There are no Patient Instructions on file for this visit.   Explained the diagnoses, plan, and follow up with the patient and they expressed understanding.  Patient expressed understanding of the importance of proper follow up care.   This document serves as a record of services personally performed by Karie Chimera, MD, PhD. It was created on their behalf by Laurian Brim, OA, an ophthalmic assistant. The creation of this record is the provider's dictation and/or activities during the visit.    Electronically signed by: Laurian Brim, OA 10.20.2020 1:00 PM   Karie Chimera, M.D., Ph.D. Diseases & Surgery of the Retina and Vitreous [facility]  I have reviewed the above documentation for accuracy and completeness, and I agree with the above. Karie Chimera, M.D., Ph.D. 10/11/19 1:00 PM   Abbreviations: M myopia (nearsighted); A astigmatism;  H hyperopia (farsighted); P presbyopia; Mrx spectacle prescription;  CTL contact lenses; OD right eye; OS left eye; OU both eyes  XT exotropia; ET esotropia; PEK punctate epithelial keratitis; PEE punctate epithelial erosions; DES dry eye syndrome; MGD meibomian gland dysfunction; ATs artificial tears; PFAT's preservative free artificial tears; NSC nuclear sclerotic cataract; PSC posterior subcapsular cataract; ERM epi-retinal membrane; PVD posterior vitreous detachment; RD retinal detachment; DM diabetes mellitus; DR diabetic retinopathy; NPDR non-proliferative diabetic retinopathy; PDR proliferative diabetic retinopathy; CSME clinically significant macular edema; DME diabetic macular edema; dbh dot blot hemorrhages; CWS cotton wool spot; POAG primary open angle glaucoma; C/D cup-to-disc ratio; HVF humphrey visual field; GVF goldmann visual field; OCT optical coherence tomography; IOP intraocular pressure; BRVO Branch retinal vein occlusion; CRVO central retinal vein occlusion; CRAO central retinal artery occlusion; BRAO branch retinal artery occlusion; RT retinal tear; SB scleral buckle; PPV pars plana vitrectomy; VH Vitreous hemorrhage; PRP panretinal laser photocoagulation; IVK intravitreal kenalog; VMT vitreomacular traction; MH Macular hole;  NVD neovascularization of the  disc; NVE neovascularization elsewhere; AREDS age related eye disease study; ARMD age related macular degeneration; POAG primary open angle glaucoma; EBMD epithelial/anterior basement membrane dystrophy; ACIOL anterior chamber intraocular lens; IOL intraocular lens; PCIOL posterior chamber intraocular lens; Phaco/IOL phacoemulsification with intraocular lens placement; [place] photorefractive keratectomy; LASIK laser assisted in situ keratomileusis; HTN hypertension; DM diabetes mellitus; COPD chronic obstructive pulmonary disease

## 2019-10-11 ENCOUNTER — Ambulatory Visit (INDEPENDENT_AMBULATORY_CARE_PROVIDER_SITE_OTHER): Payer: Medicare Other | Admitting: Ophthalmology

## 2019-10-11 ENCOUNTER — Telehealth: Payer: Self-pay | Admitting: Cardiovascular Disease

## 2019-10-11 ENCOUNTER — Encounter (INDEPENDENT_AMBULATORY_CARE_PROVIDER_SITE_OTHER): Payer: Self-pay | Admitting: Ophthalmology

## 2019-10-11 ENCOUNTER — Other Ambulatory Visit: Payer: Self-pay

## 2019-10-11 DIAGNOSIS — H35033 Hypertensive retinopathy, bilateral: Secondary | ICD-10-CM

## 2019-10-11 DIAGNOSIS — H2101 Hyphema, right eye: Secondary | ICD-10-CM

## 2019-10-11 DIAGNOSIS — H4311 Vitreous hemorrhage, right eye: Secondary | ICD-10-CM | POA: Diagnosis not present

## 2019-10-11 DIAGNOSIS — I10 Essential (primary) hypertension: Secondary | ICD-10-CM

## 2019-10-11 DIAGNOSIS — H3581 Retinal edema: Secondary | ICD-10-CM

## 2019-10-11 DIAGNOSIS — Z961 Presence of intraocular lens: Secondary | ICD-10-CM

## 2019-10-11 NOTE — Telephone Encounter (Signed)
Returned the call to the patient's wife. He has been diagnosed with a bleed in the right eye The patient has been seeing Dr. Coralyn Pear (notes in epic). The patient's wife stated that they feel like it could be from the Eliquis and has asked that Dr. Sallyanne Kuster touch base with Dr. Coralyn Pear.  He was seen today and goes back on Thursday.   [place].

## 2019-10-11 NOTE — Telephone Encounter (Signed)
New message   Patient's wife states that patient has a blood bleed in eye and wonders it coming from Eliquis. Please advise.

## 2019-10-12 ENCOUNTER — Ambulatory Visit: Payer: Medicare Other | Admitting: Adult Health

## 2019-10-12 ENCOUNTER — Encounter: Payer: Self-pay | Admitting: Adult Health

## 2019-10-12 ENCOUNTER — Telehealth: Payer: Self-pay | Admitting: Cardiovascular Disease

## 2019-10-12 ENCOUNTER — Ambulatory Visit (HOSPITAL_COMMUNITY)
Admission: RE | Admit: 2019-10-12 | Discharge: 2019-10-12 | Disposition: A | Discharge status: Home / Self Care | Payer: Medicare Other | Source: Ambulatory Visit | Attending: Internal Medicine | Admitting: Internal Medicine

## 2019-10-12 VITALS — BP 128/64 | HR 83 | Temp 98.4°F | Ht 73.0 in | Wt 174.0 lb

## 2019-10-12 DIAGNOSIS — I7121 Aneurysm of the ascending aorta, without rupture: Secondary | ICD-10-CM

## 2019-10-12 DIAGNOSIS — M1000 Idiopathic gout, unspecified site: Secondary | ICD-10-CM

## 2019-10-12 DIAGNOSIS — Z79899 Other long term (current) drug therapy: Secondary | ICD-10-CM | POA: Diagnosis not present

## 2019-10-12 DIAGNOSIS — M7989 Other specified soft tissue disorders: Secondary | ICD-10-CM | POA: Diagnosis not present

## 2019-10-12 DIAGNOSIS — M79602 Pain in left arm: Secondary | ICD-10-CM | POA: Insufficient documentation

## 2019-10-12 DIAGNOSIS — E611 Iron deficiency: Secondary | ICD-10-CM

## 2019-10-12 DIAGNOSIS — I712 Thoracic aortic aneurysm, without rupture: Secondary | ICD-10-CM

## 2019-10-12 DIAGNOSIS — I5042 Chronic combined systolic (congestive) and diastolic (congestive) heart failure: Secondary | ICD-10-CM

## 2019-10-12 DIAGNOSIS — I480 Paroxysmal atrial fibrillation: Secondary | ICD-10-CM

## 2019-10-12 LAB — BASIC METABOLIC PANEL
BUN/Creatinine Ratio: 19 (ref 10–24)
BUN: 23 mg/dL (ref 8–27)
CO2: 27 mmol/L (ref 20–29)
Calcium: 9.4 mg/dL (ref 8.6–10.2)
Chloride: 102 mmol/L (ref 96–106)
Creatinine, Ser: 1.2 mg/dL (ref 0.76–1.27)
GFR calc Af Amer: 67 mL/min/{1.73_m2} (ref >59)
GFR calc non Af Amer: 58 mL/min/{1.73_m2} — ABNORMAL LOW (ref >59)
Glucose: 76 mg/dL (ref 65–99)
Potassium: 3.4 mmol/L — ABNORMAL LOW (ref 3.5–5.2)
Sodium: 135 mmol/L (ref 134–144)

## 2019-10-12 LAB — CBC
Hematocrit: 25.9 % — ABNORMAL LOW (ref 37.5–51.0)
Hemoglobin: 8.6 g/dL — ABNORMAL LOW (ref 13.0–17.7)
MCH: 29.5 pg (ref 26.6–33.0)
MCHC: 33.2 g/dL (ref 31.5–35.7)
MCV: 89 fL (ref 79–97)
Platelets: 210 10*3/uL (ref 150–450)
RBC: 2.92 x10E6/uL — ABNORMAL LOW (ref 4.14–5.80)
RDW: 16.2 % — ABNORMAL HIGH (ref 11.6–15.4)
WBC: 8.2 10*3/uL (ref 3.4–10.8)

## 2019-10-12 LAB — URIC ACID: Uric Acid: 9.6 mg/dL — ABNORMAL HIGH (ref 3.7–8.6)

## 2019-10-12 NOTE — Telephone Encounter (Signed)
I spoke with Dr. Coralyn Pear and with Elenore Rota and Cotter. He has a hyphema that interferes with vision. Stop Eliquis (a little early after cardioversion, but small risk of embolic CVA is outweighed by the risk of further vision loss). He also has a lot of musculoskeletal complaints and will (temporarily) stop the atorvastatin, for about a month. Zigmund Daniel is worried that his musculoskeletal complaints are similar to the past event when his Potassium level was low. Will have labs drawn when he comes into the office this afternoon.

## 2019-10-12 NOTE — Progress Notes (Signed)
Thanks

## 2019-10-12 NOTE — Patient Instructions (Signed)
Medication Instructions:  Continue current medication  If you need a refill on your cardiac medications before your next appointment, please call your pharmacy.  Labwork: CBC, BMP, Uric Acid STAT HERE IN OUR OFFICE AT [facility]    You will NOT need to fast   If you have labs (blood work) drawn today and your tests are completely normal, you will receive your results only by: [name] MyChart Message (if you have MyChart) OR . A paper copy in the mail If you have any lab test that is abnormal or we need to change your treatment, we will call you to review the results.  Testing/Procedures: Your physician has requested that you have a  upper extremity venous duplex. This test is an ultrasound of the veins in the legs or arms. It looks at venous blood flow that carries blood from the heart to the legs or arms. Allow one hour for a Lower Venous exam. Allow thirty minutes for an Upper Venous exam. There are no restrictions or special instructions.   Follow-Up: Your physician recommends that you schedule a follow-up appointment in: 1 Month with Dr Sallyanne Kuster  At [facility], you and your health needs are our priority.  As part of our continuing mission to provide you with exceptional heart care, we have created designated Provider Care Teams.  These Care Teams include your primary Cardiologist (physician) and Advanced Practice Providers (APPs -  Physician Assistants and Nurse Practitioners) who all work together to provide you with the care you need, when you need it.  Thank you for choosing [facility] at [facility]!!

## 2019-10-12 NOTE — Telephone Encounter (Signed)
Spoke with pt wife, the patient has multiple complaints. The patient is being seen for a bleed in his eye that the wife reports the eye doctor thinks it is due to eliquis. She reports the patient is unable to use his left hand but they do not feel this is stroke related. The patient reports this is how he got when his potassium got too low. He is not taking the iron as it constipated him and he does not want to take it. The patient reports every bone in his body aches. Explained that this does not sound hgeart related and they should see their medical doctor and they do not have one they rely on dr croitoru. They do not want to go to the ER as it is too expensive. They requested the patient be seen today. Follow up scheduled

## 2019-10-12 NOTE — Progress Notes (Signed)
Cardiology Office Note   Date:  10/12/2019   ID:  [name], DOB [birth year], MRN [id number]  PCP:  Patient, No Pcp Per  Cardiologist: Dr.Croitoru  CC; left arm pain and swelling   History of Present Illness: [name] is a 78 y.o. male who presents at the request of his wife who called [place] earlier today.  Apparently he has multiple noncardiac complaints and was unable to get in to see his primary care and refused to go to ER.  The patient has been unable to use his left hand due to pain and swelling, he was not taking iron supplement it was causing constipation, he states that every bone in his body aches, and his arthritis has worsened.  Dr. 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  He was to stop Eliquis.  Atorvastatin was also discontinued due to myalgia pain.  His wife was worried that his potassium was low as she had had issues with that in the past.  [name] has a past medical history of atrial fibrillation, and had a direct-current cardioversion after 3-weeks of oral anticoagulation.  He has DCCV completed on 09/30/2019 converting him to A paced and BiV pacing. 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   Past Medical History:  Diagnosis Date  . Anginal pain ([place])   . Anxiety   . Aortic stenosis    a. s/p bioprosthetic AVR in 2010  . Arthritis    gout  . CAD (coronary artery disease)    a. s/p CABG in 1992 b. redo CABG in 2004 c. cath in 2010 showing patent LIMA-LAD, free RIMA-OM, and SVG-OM, and 80% stenosis of small PLA  branch  . Chest pain   . CHF (congestive heart failure) ([place])   . Dysrhythmia   . GERD (gastroesophageal reflux disease)    with gas and bloating probably cause of chest pain  . H/O atrial flutter    a. s/p ablation and not on anticoagulation secondary to GIB and alcohol abuse.   . H/O: gout   . Hyperlipidemia   . Hypertension   . ICD (implantable cardiac defibrillator) infection, s/p removal of ICD 07/12/2012   re-implanted Medtronic BiV ICD, serial number [id number]   . Ischemic cardiomyopathy    a. EF previously 20-25%, improved to 40-45% by echo in 03/2013 b. 10/2017: echo showing of 30-35% with Grade 2 DD, normal function of AVR, moderate MR.   . Myocardial infarction ([place])   . 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 Shortness of breath     Past Surgical History:  Procedure Laterality Date  . BI-VENTRICULAR IMPLANTABLE CARDIOVERTER DEFIBRILLATOR Right 08/18/2012   Medtronic BiV ICD, serial number [id number] ; Procedure: BI-VENTRICULAR IMPLANTABLE CARDIOVERTER DEFIBRILLATOR  (CRT-D);  Surgeon: Evans Lance, MD;  Location: [facility] CATH LAB;  Service: Cardiovascular;  Laterality: Right;  . BIV ICD GENERATOR CHANGEOUT N/A 09/08/2018   Procedure: BIV ICD GENERATOR CHANGEOUT;  Surgeon: Sanda Klein, MD;  Location: [facility] CV LAB;  Service: Cardiovascular;  Laterality: N/A;  . CARDIAC CATHETERIZATION  Dec 18, 2003  with a presentation of atrial flutter with rapid ventricular response. He had a cath and two stents to his PDA after his SVG to his RCA. His other graft were patent. He had LV with EF of 30%.   . CARDIOVERSION N/A 05/30/2019   Procedure: CARDIOVERSION;  Surgeon: Thurmon Fairroitoru, Mihai, MD;  Location: [facility] ENDOSCOPY;  Service: Cardiovascular;  Laterality: N/A;  . CARDIOVERSION N/A 09/30/2019   Procedure: CARDIOVERSION;  Surgeon: Thurmon Fairroitoru, Mihai, MD;  Location: [facility] ENDOSCOPY;  Service: Cardiovascular;  Laterality: N/A;  . CAROTID ENDARTERECTOMY   11/2002   left in December 2003 and known renal artery stenosis of 60-70%  . CORONARY ARTERY BYPASS GRAFT  1993   redo in 2000, he has had multiple MI previous to both procedures.  . IMPLANTABLE CARDIOVERTER DEFIBRILLATOR (ICD) GENERATOR CHANGE N/A 06/25/2012   Procedure: ICD GENERATOR CHANGE;  Surgeon: Marinus MawGregg W Taylor, MD;  [facility];  Service: Cardiovascular;  Laterality: N/A;  . VALVE REPLACEMENT  2010     Current Outpatient Medications  Medication Sig Dispense Refill  . amiodarone (PACERONE) 200 MG tablet Take 1 tablet (200 mg total) by mouth daily. 30 tablet 11  . apixaban (ELIQUIS) 5 MG TABS tablet Take 1 tablet (5 mg total) by mouth 2 (two) times daily. 60 tablet 6  . atorvastatin (LIPITOR) 10 MG tablet TAKE 1 TABLET BY MOUTH EVERY DAY 90 tablet 0  . carvedilol (COREG) 6.25 MG tablet Take 1 tablet (6.25 mg total) by mouth 2 (two) times daily with a meal. 60 tablet 11  . colchicine 0.6 MG tablet TAKE 1 TABLET (0.6 MG TOTAL) BY MOUTH AS NEEDED (GOUT ATTACKS). 30 tablet 3  . ferrous sulfate 325 (65 FE) MG EC tablet Take 1 tablet (325 mg total) by mouth 2 (two) times daily. 60 tablet 11  . folic acid (FOLVITE) 1 MG tablet Take 1 tablet (1 mg total) by mouth daily. 30 tablet 11  . furosemide (LASIX) 40 MG tablet Take 1 tablet (40 mg total) by mouth 2 (two) times daily. 60 tablet 11  . Menthol, Topical Analgesic, (BENGAY EX) Apply 1 application topically daily as needed (pain).    . nitroGLYCERIN (NITROSTAT) 0.4 MG SL tablet Place 1 tablet (0.4 mg) under the tongue every 5 minutes as needed for chest pain for up to 3 doses/call 9-1-1 and MD if no relief 25 tablet 0  . potassium chloride (KLOR-CON M10) 10 MEQ tablet Take 1 tablet (10 mEq total) by mouth daily. 30 tablet 2  . prednisoLONE acetate (PRED FORTE) 1 % ophthalmic suspension Place 1 drop into the right eye 4 (four) times daily. 15 mL 0   Current Facility-Administered Medications  Medication Dose Route Frequency Provider  Last Rate Last Dose  . sodium chloride flush (NS) 0.9 % injection 3 mL  3 mL Intravenous Q12H Croitoru, Mihai, MD        Allergies:   Atorvastatin and Other    Social History:  The patient  reports that he has never smoked. His smokeless tobacco use includes chew. He reports current alcohol use of about 14.0 standard drinks of alcohol per week. He reports that he does not use drugs.   Family History:  The patient's family history includes CAD in his brother.    ROS: All other systems are reviewed and negative. Unless otherwise mentioned in H&P    PHYSICAL EXAM: VS:  BP 128/64   Pulse 83   Temp 98.4 F (36.9 C)   Ht 6\' 1"  (1.854 m)  Wt 174 lb (78.9 kg)   SpO2 95%   BMI 22.96 kg/m  , BMI Body mass index is 22.96 kg/m. GEN: Well nourished, well developed, in no acute distress HEENT: normal Neck: no JVD, carotid bruits, or masses Cardiac: RRR; no murmurs, rubs, or gallops,no edema  Respiratory:  Clear to auscultation bilaterally, normal work of breathing GI: soft, nontender, nondistended, + BS MS: no deformity or atrophy, left arm edematous with pain and stiffness, decreased ROM, and weakened hand grip.  Skin: warm and dry, no rash Neuro:  Strength and sensation are intact Psych: euthymic mood, full affect   EKG:  Not completed this office visit.    Recent Labs: 01/20/2019: Magnesium 1.8 09/26/2019: ALT 8; BUN 33; Creatinine, Ser 1.55; Hemoglobin 8.7; Platelets 190; Potassium 3.8; Sodium 141; TSH 5.250    Lipid Panel    Component Value Date/Time   CHOL 87 (L) 05/05/2019 1402   TRIG 44 05/05/2019 1402   HDL 46 05/05/2019 1402   CHOLHDL 1.9 05/05/2019 1402   CHOLHDL 4.2 12/19/2016 0841   VLDL 15 12/19/2016 0841   LDLCALC 32 05/05/2019 1402      Wt Readings from Last 3 Encounters:  10/12/19 174 lb (78.9 kg)  09/30/19 170 lb (77.1 kg)  09/26/19 173 lb (78.5 kg)      Other studies Reviewed: Echocardiogram Apr 06, 2018.  Left ventricle: The cavity size was mildly  dilated. There was   mild concentric hypertrophy. Systolic function was moderately to   severely reduced. The estimated ejection fraction was in the   range of 30% to 35%. Diffuse hypokinesis worse in the   anterolateral, inferolateral, inferior, and apical myocardium.   Doppler parameters are consistent with a reversible restrictive   pattern, indicative of decreased left ventricular diastolic   compliance and/or increased left atrial pressure (grade 3   diastolic dysfunction). Doppler parameters are consistent with   high ventricular filling pressure. - Aortic valve: A bioprosthesis was present. Valve area (VTI): 2.02   cm^2. Valve area (Vmax): 2.02 cm^2. Valve area (Vmean): 1.95   cm^2. - Mitral valve: Transvalvular velocity was within the normal range.   There was no evidence for stenosis. There was trivial   regurgitation. - Left atrium: The atrium was severely dilated. - Right ventricle: The cavity size was normal. Wall thickness was   normal. Systolic function was mildly reduced. - Right atrium: The atrium was moderately dilated. - Atrial septum: No defect or patent foramen ovale was identified   by color flow Doppler. - Tricuspid valve: There was mild regurgitation. - Pulmonary arteries: Systolic pressure was increased. PA peak   pressure: 44 mm Hg (S).   ASSESSMENT AND PLAN:  1.Edema of left arm: Doubt DVT as he has been on Eliquis until one day ago, but due to swelling, pain and decreased ROM, he will have a Doppler Venous Ultrasound of the left upper extremity in the office today.   Doppler ultrasound did not find evidence of DVT.  This is reassuring, and discussed with the patient. He will have STAT uric acid level, BMET today.  He has a history of gout, this may be an exacerbation.   2. Hx of hypokalemia: Checking STAT BMET today.    3.  Anemia: Last CBC on 09/24/2019 demonstrated Hgb of 8.7. He has not been taking the iron supplement. Will check this as he has been  having some dyspnea.   4. Hx of Gouty Arthritis: Checking uric acid level. He is advised to follow up with  PCP for management. He has been on steroids for this in the past, and has colchicine at home, but is not taking this.    5. ICD in situ: Interrogation per protocol   6. Combined CHF: Does not appear to be fluid overloaded on exam. No LEE, abdominal distention. He has swelling in his left arm and has been ruled our for DVT.   7. Atrial Fib: S/P DCCV.  Taken off of Eliquis due to hyphema of the right eye.  Rate is controlled today.    Current medicines are reviewed at length with the patient today.  I have spend over 40 minutes with this patient concerning his multiple complaints.    Tests ordered today include: BMET. CBC, Uric Acid, [place] Doppler Ultrasound.   [name]. [name], ANP, AACC   10/12/2019 2:32 PM    [facility] [address] Office [phone] Fax [phone]  Notice: This dictation was prepared with Dragon dictation along with smaller phrase technology. Any transcriptional errors that result from this process are unintentional and may not be corrected upon review.

## 2019-10-12 NOTE — Telephone Encounter (Signed)
New message:    Patient has a blood bleed in his eyes. Patient can not use his left hand. Patient wife states that every time patient movies he is pain. Patient wife would like for some one to call her.

## 2019-10-13 ENCOUNTER — Ambulatory Visit (INDEPENDENT_AMBULATORY_CARE_PROVIDER_SITE_OTHER): Payer: Medicare Other | Admitting: Ophthalmology

## 2019-10-13 ENCOUNTER — Encounter (INDEPENDENT_AMBULATORY_CARE_PROVIDER_SITE_OTHER): Payer: Self-pay | Admitting: Ophthalmology

## 2019-10-13 DIAGNOSIS — H4311 Vitreous hemorrhage, right eye: Secondary | ICD-10-CM | POA: Diagnosis not present

## 2019-10-13 DIAGNOSIS — H2101 Hyphema, right eye: Secondary | ICD-10-CM

## 2019-10-13 DIAGNOSIS — H3581 Retinal edema: Secondary | ICD-10-CM | POA: Diagnosis not present

## 2019-10-13 DIAGNOSIS — H35033 Hypertensive retinopathy, bilateral: Secondary | ICD-10-CM

## 2019-10-13 DIAGNOSIS — I10 Essential (primary) hypertension: Secondary | ICD-10-CM | POA: Diagnosis not present

## 2019-10-13 DIAGNOSIS — Z961 Presence of intraocular lens: Secondary | ICD-10-CM

## 2019-10-13 MED ORDER — PREDNISOLONE ACETATE 1 % OP SUSP: 1.0000 [drp] | Freq: Four times a day (QID) | OPHTHALMIC | 0 refills | Status: DC

## 2019-10-13 MED ORDER — BRIMONIDINE TARTRATE 0.2 % OP SOLN: 1.0000 [drp] | Freq: Two times a day (BID) | OPHTHALMIC | 1 refills | Status: DC

## 2019-10-13 NOTE — Progress Notes (Addendum)
[facility] - Clinic Note  10/13/2019     CHIEF COMPLAINT Patient presents for Retina Follow Up   HISTORY OF PRESENT ILLNESS: [name] is a 78 y.o. male who presents to the clinic today for:   HPI    Retina Follow Up    Patient presents with  Other.  In right eye.  This started days ago.  Severity is severe.  Duration of days.  Since onset it is stable.  I, the attending physician,  performed the HPI with the patient and updated documentation appropriately.          Comments    Patient states his vision OD is the same and is concerned that it may not get any better--patient states he occasionally sees something floating by in the right eye only. Patient denies eye pain or discomfort.       Last edited by Rennis Chris, MD on 10/13/2019  8:33 AM. (History)    pts cardiologist said it was okay for him to stop his blood thinner, so he quit taking it yesterday, he states he has been still as "best he could"   Referring physician: No referring provider defined for this encounter.  HISTORICAL INFORMATION:   Selected notes from the MEDICAL RECORD NUMBER    CURRENT MEDICATIONS: Current Outpatient Medications (Ophthalmic Drugs)  Medication Sig  . brimonidine (ALPHAGAN) 0.2 % ophthalmic solution Place 1 drop into the right eye 2 (two) times daily.  . prednisoLONE acetate (PRED FORTE) 1 % ophthalmic suspension Place 1 drop into the right eye 4 (four) times daily.   No current facility-administered medications for this visit.  (Ophthalmic Drugs)   Current Outpatient Medications (Other)  Medication Sig  . amiodarone (PACERONE) 200 MG tablet Take 1 tablet (200 mg total) by mouth daily.  [name] atorvastatin (LIPITOR) 10 MG tablet TAKE 1 TABLET BY MOUTH EVERY DAY  . carvedilol (COREG) 6.25 MG tablet Take 1 tablet (6.25 mg total) by mouth 2 (two) times daily with a meal.  . colchicine 0.6 MG tablet TAKE 1 TABLET (0.6 MG TOTAL) BY MOUTH AS NEEDED (GOUT ATTACKS).   . ferrous sulfate 325 (65 FE) MG EC tablet Take 1 tablet (325 mg total) by mouth 2 (two) times daily.  . folic acid (FOLVITE) 1 MG tablet Take 1 tablet (1 mg total) by mouth daily.  . furosemide (LASIX) 40 MG tablet Take 1 tablet (40 mg total) by mouth 2 (two) times daily.  . Menthol, Topical Analgesic, (BENGAY EX) Apply 1 application topically daily as needed (pain).  . nitroGLYCERIN (NITROSTAT) 0.4 MG SL tablet Place 1 tablet (0.4 mg) under the tongue every 5 minutes as needed for chest pain for up to 3 doses/call 9-1-1 and MD if no relief  . potassium chloride (KLOR-CON M10) 10 MEQ tablet Take 1 tablet (10 mEq total) by mouth daily.   Current Facility-Administered Medications (Other)  Medication Route  . sodium chloride flush (NS) 0.9 % injection 3 mL Intravenous      REVIEW OF SYSTEMS: ROS    Positive for: Genitourinary, Cardiovascular, Eyes   Negative for: Constitutional, Gastrointestinal, Neurological, Skin, Musculoskeletal, HENT, Endocrine, Respiratory, Psychiatric, Allergic/Imm, Heme/Lymph   Last edited by Corrinne Eagle on 10/13/2019  8:06 AM. (History)       ALLERGIES Allergies  Allergen Reactions  . Atorvastatin Other (See Comments)    Joint pain (patient takes this, however)  . Other Other (See Comments)    Patient is sodium-restricted, per his  admission    PAST MEDICAL HISTORY Past Medical History:  Diagnosis Date  . Anginal pain ([place])   . Anxiety   . Aortic stenosis    a. s/p bioprosthetic AVR in 2010  . Arthritis    gout  . CAD (coronary artery disease)    a. s/p CABG in 1992 b. redo CABG in 2004 c. cath in 2010 showing patent LIMA-LAD, free RIMA-OM, and SVG-OM, and 80% stenosis of small PLA branch  . Chest pain   . CHF (congestive heart failure) ([place])   . Dysrhythmia   . GERD (gastroesophageal reflux disease)    with gas and bloating probably cause of chest pain  . H/O atrial flutter    a. s/p ablation and not on anticoagulation secondary to GIB  and alcohol abuse.   . H/O: gout   . Hyperlipidemia   . Hypertension   . ICD (implantable cardiac defibrillator) infection, s/p removal of ICD 07/12/2012   re-implanted Medtronic BiV ICD, serial number [id number]   . Ischemic cardiomyopathy    a. EF previously 20-25%, improved to 40-45% by echo in 03/2013 b. 10/2017: echo showing of 30-35% with Grade 2 DD, normal function of AVR, moderate MR.   . Myocardial infarction ([place])   . 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 Shortness of breath    Past Surgical History:  Procedure Laterality Date  . BI-VENTRICULAR IMPLANTABLE CARDIOVERTER DEFIBRILLATOR Right 08/18/2012   Medtronic BiV ICD, serial number [id number] ; Procedure: BI-VENTRICULAR IMPLANTABLE CARDIOVERTER DEFIBRILLATOR  (CRT-D);  Surgeon: Evans Lance, MD;  Location: [facility] CATH LAB;  Service: Cardiovascular;  Laterality: Right;  . BIV ICD GENERATOR CHANGEOUT N/A 09/08/2018   Procedure: BIV ICD GENERATOR CHANGEOUT;  Surgeon: Sanda Klein, MD;  Location: [place] CV LAB;  Service: Cardiovascular;  Laterality: N/A;  . CARDIAC CATHETERIZATION  Dec 18, 2003   with a presentation of atrial flutter with rapid ventricular response. He had a cath and two stents to his PDA after his SVG to his RCA. His other graft were patent. He had LV with EF of 30%.   . CARDIOVERSION N/A 05/30/2019   Procedure: CARDIOVERSION;  Surgeon: Sanda Klein, MD;  Location: [place] ENDOSCOPY;  Service: Cardiovascular;  Laterality: N/A;  . CARDIOVERSION N/A 09/30/2019   Procedure: CARDIOVERSION;  Surgeon: Sanda Klein, MD;  Location: [place] ENDOSCOPY;  Service: Cardiovascular;  Laterality: N/A;  . CAROTID ENDARTERECTOMY  11/2002   left in December 2003 and known renal artery stenosis of 60-70%  . CORONARY ARTERY BYPASS GRAFT  1993   redo in 2000, he has had multiple MI previous to both procedures.  . IMPLANTABLE CARDIOVERTER DEFIBRILLATOR (ICD) GENERATOR CHANGE N/A 06/25/2012    Procedure: ICD GENERATOR CHANGE;  Surgeon: Evans Lance, MD;  Location: [facility] CATH LAB;  Service: Cardiovascular;  Laterality: N/A;  . VALVE REPLACEMENT  2010    FAMILY HISTORY Family History  Problem Relation Age of Onset  . CAD Brother     SOCIAL HISTORY Social History   Tobacco Use  . Smoking status: Never Smoker  . Smokeless tobacco: Current User    Types: Chew  Substance Use Topics  . Alcohol use: Yes    Alcohol/week: 14.0 standard drinks    Types: 14 Cans of beer per week  . Drug use: No         OPHTHALMIC EXAM:  Base Eye Exam    Visual Acuity (Snellen - Linear)      Right  Left   Dist [place] HM 20/25   Dist ph [place] NI        Tonometry (Tonopen, 8:10 AM)      Right Left   Pressure 25 13       Pupils      Dark Light Shape React APD   Right 5 4 Round Minimal 0   Left 1 1 Round Minimal 0  Hazy OD       Visual Fields      Left Right    Full    Restrictions  Total superior temporal, inferior temporal, superior nasal, inferior nasal deficiencies       Extraocular Movement      Right Left    Full Full       Neuro/Psych    Oriented x3: Yes   Mood/Affect: Normal       Dilation    Both eyes: 1.0% Mydriacyl, 2.5% Phenylephrine @ 8:10 AM        Slit Lamp and Fundus Exam    Slit Lamp Exam      Right Left   Lids/Lashes Dermatochalasis - upper lid, Meibomian gland dysfunction Dermatochalasis - upper lid, Meibomian gland dysfunction   Conjunctiva/Sclera Mild, temporal Pinguecula, white and quiet Mild, temporal Pinguecula, white and quiet   Cornea 2+ Punctate epithelial erosions 2+ Punctate epithelial erosions, tear film debris   Anterior Chamber Deep, 4+RBC, 1.76mm layered hypehema -- increased from prior Deep and quiet   Iris Round and very poorly dilated to 2.17mm Round and poorly dilated to 4mm   Lens Posterior chamber intraocular lens, ?mild pseudophakodenesis, red heme overlying pupil Posterior chamber intraocular lens   Vitreous diffuse red VH Mild  Vitreous syneresis       Fundus Exam      Right Left   Disc no view Pink and Sharp   C/D Ratio  0.2   Macula no view Flat, Good foveal reflex, No heme or edema   Vessels no view milid Vascular attenuation   Periphery no view Attached             IMAGING AND PROCEDURES  Imaging and Procedures for @           ASSESSMENT/PLAN:    ICD-10-CM   1. Hyphema, right eye  H21.01 Paracentesis of Anterior Chamber, Therapeutic - OD - Right Eye  2. Vitreous hemorrhage, right eye (HCC)  H43.11   3. Retinal edema  H35.81 CANCELED: OCT, Retina - OU - Both Eyes  4. Essential hypertension  I10   5. Hypertensive retinopathy of both eyes  H35.033   6. Pseudophakia of both eyes  Z96.1     1-3. Vitreous hemorrhage w/ layered hyphema OD  - pt with significant cardiac history and recent cardioversion on Eliquis -- pt has stopped Eliquis as of 10.21.20 -- cleared with cardiologist  - presents with 2 wk history of decreased vision OD -- was intermittent initially, but 2 days prior to presentation (~10.14.20), became stably poor without improvement  - exam shows poor dilation, layered hyphema and vitreous hemorrhage OD; mild pseudophakodonesis -- ?UGH syndrome  - hyphema today slightly increased to 1.80mm from 1mm on Tuesday  - IOP up to 25 mmHg OD  - recommend therapeutic AC tap to remove some heme and lower IOP  - RBA of procedure discussed, questions answered  - pt wishes to proceed with tap -- completed without complication  - no view of posterior pole due to heme--repeat b-scan 10.20.20 showed +vit opacities/heme, no obvious  RD or mass -- majority of vit opacities are anterior close to IOL  - pt denies trauma or fall; suspect VH related to Eliquis use + 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 Hypertensive retinopathy OU  - discussed importance of tight BP control  - monitor  6. Pseudophakia OU  - s/p CE/IOL OU  - monitor   Ophthalmic Meds Ordered this visit:  Meds ordered this encounter  Medications  . brimonidine (ALPHAGAN) 0.2 % ophthalmic solution    Sig: Place 1 drop into the right eye 2 (two) times daily.    Dispense:  10 mL    Refill:  1  . prednisoLONE acetate (PRED FORTE) 1 % ophthalmic suspension    Sig: Place 1 drop into the right eye 4 (four) times daily.    Dispense:  15 mL    Refill:  0       Return in about 4 days (around 10/17/2019) for f/u hyphema, vH OD.  There are no Patient Instructions on file for this visit.   Explained the diagnoses, plan, and follow up with the patient and they expressed understanding.  Patient expressed understanding of the importance of proper follow up care.   This document serves as a record of services personally performed by Karie ChimeraBrian G. Alizay Bronkema, MD, PhD. It was created on their behalf by Laurian BrimAmanda Brown, OA, an ophthalmic assistant. The creation of this record is the provider's dictation and/or activities during the visit.    Electronically signed by: Laurian BrimAmanda Brown, OA 10.22.2020 7:14 PM   Karie ChimeraBrian G. Ted Goodner, M.D., Ph.D. Diseases & Surgery of the Retina and Vitreous [facility]  I have reviewed the above documentation for accuracy and completeness, and I agree with the above. Karie ChimeraBrian G. Joeli Fenner, M.D., Ph.D. 10/13/19 7:14 PM    Abbreviations: M myopia (nearsighted); A astigmatism; H hyperopia (farsighted); P presbyopia; Mrx spectacle prescription;  CTL contact lenses; OD right eye; OS left eye; OU both eyes  XT exotropia; ET esotropia; PEK punctate epithelial keratitis; PEE punctate  epithelial erosions; DES dry eye syndrome; MGD meibomian gland dysfunction; ATs artificial tears; PFAT's preservative free artificial tears; NSC nuclear sclerotic cataract; PSC posterior subcapsular cataract; ERM epi-retinal membrane; PVD posterior vitreous detachment; RD retinal detachment; DM diabetes mellitus; DR diabetic retinopathy; NPDR non-proliferative diabetic retinopathy; PDR proliferative diabetic retinopathy; CSME clinically significant macular edema; DME diabetic macular edema; dbh dot blot hemorrhages; CWS cotton wool spot; POAG primary open angle glaucoma; C/D cup-to-disc ratio; HVF humphrey visual field; GVF goldmann visual field; OCT optical coherence tomography; IOP intraocular pressure; BRVO Branch retinal vein occlusion; CRVO central retinal vein occlusion; CRAO central retinal artery occlusion; BRAO branch retinal artery occlusion; RT retinal tear; SB scleral buckle; PPV pars plana vitrectomy; VH Vitreous hemorrhage; PRP panretinal laser photocoagulation; IVK intravitreal kenalog; VMT vitreomacular traction; MH Macular hole;  NVD neovascularization of the disc; NVE neovascularization elsewhere; AREDS age related eye disease study; ARMD age related macular degeneration; POAG primary open angle glaucoma; EBMD epithelial/anterior basement membrane dystrophy; ACIOL anterior chamber intraocular  lens; IOL intraocular lens; PCIOL posterior chamber intraocular lens; Phaco/IOL phacoemulsification with intraocular lens placement; [place] photorefractive keratectomy; LASIK laser assisted in situ keratomileusis; HTN hypertension; DM diabetes mellitus; COPD chronic obstructive pulmonary disease

## 2019-10-14 ENCOUNTER — Encounter: Payer: Medicare Other | Admitting: *Deleted

## 2019-10-14 ENCOUNTER — Telehealth: Payer: Self-pay | Admitting: *Deleted

## 2019-10-14 MED ORDER — POTASSIUM CHLORIDE CRYS ER 20 MEQ PO TBCR: 20.0000 meq | EXTENDED_RELEASE_TABLET | Freq: Every day | ORAL | 6 refills | Status: DC

## 2019-10-14 MED ORDER — COLCHICINE 0.6 MG PO TABS: 0.6000 mg | ORAL_TABLET | ORAL | 0 refills | Status: DC | PRN

## 2019-10-14 NOTE — Telephone Encounter (Signed)
-----   Message from Lendon Colonel, NP sent at 10/12/2019  3:50 PM EDT ----- Thank you.

## 2019-10-17 ENCOUNTER — Ambulatory Visit (INDEPENDENT_AMBULATORY_CARE_PROVIDER_SITE_OTHER): Payer: Medicare Other | Admitting: Ophthalmology

## 2019-10-17 ENCOUNTER — Encounter (INDEPENDENT_AMBULATORY_CARE_PROVIDER_SITE_OTHER): Payer: Self-pay | Admitting: Ophthalmology

## 2019-10-17 DIAGNOSIS — I10 Essential (primary) hypertension: Secondary | ICD-10-CM

## 2019-10-17 DIAGNOSIS — H4311 Vitreous hemorrhage, right eye: Secondary | ICD-10-CM

## 2019-10-17 DIAGNOSIS — H3581 Retinal edema: Secondary | ICD-10-CM | POA: Diagnosis not present

## 2019-10-17 DIAGNOSIS — H2101 Hyphema, right eye: Secondary | ICD-10-CM | POA: Diagnosis not present

## 2019-10-17 DIAGNOSIS — Z961 Presence of intraocular lens: Secondary | ICD-10-CM

## 2019-10-17 DIAGNOSIS — H35033 Hypertensive retinopathy, bilateral: Secondary | ICD-10-CM

## 2019-10-17 NOTE — Progress Notes (Signed)
[facility] - Clinic Note  10/17/2019     CHIEF COMPLAINT Patient presents for Retina Follow Up   HISTORY OF PRESENT ILLNESS: [name] is a 78 y.o. male who presents to the clinic today for:   HPI    Retina Follow Up    Patient presents with  Other.  In right eye.  This started 2 weeks ago.  Severity is severe.  Duration of 4 days.  Since onset it is stable.  I, the attending physician,  performed the HPI with the patient and updated documentation appropriately.          Comments    78 y/o male pt here for 4 day f/u for VH w/layered hyphema OD.  No major change in [place] OD, but pt can see more HM and light OD.  No change in [place] OS.  Denies pain, flashes, new floaters.  Atropine BID OD PF QID OD Cosopt BID OD Brimonidine BID OD       Last edited by Rennis Chris, MD on 10/17/2019  5:49 PM. (History)    pt states he is doing "pretty fair", he states he is still off his eliquis, pt is staying still "as much as possible", pt states since yesterday he feels like his vision has started to improve a little,   Referring physician: No referring provider defined for this encounter.  HISTORICAL INFORMATION: vitre  Selected notes from the MEDICAL RECORD NUMBER    CURRENT MEDICATIONS: Current Outpatient Medications (Ophthalmic Drugs)  Medication Sig  . brimonidine (ALPHAGAN) 0.2 % ophthalmic solution Place 1 drop into the right eye 2 (two) times daily.  . prednisoLONE acetate (PRED FORTE) 1 % ophthalmic suspension Place 1 drop into the right eye 4 (four) times daily.   No current facility-administered medications for this visit.  (Ophthalmic Drugs)   Current Outpatient Medications (Other)  Medication Sig  . amiodarone (PACERONE) 200 MG tablet Take 1 tablet (200 mg total) by mouth daily.  [name] atorvastatin (LIPITOR) 10 MG tablet TAKE 1 TABLET BY MOUTH EVERY DAY  . carvedilol (COREG) 6.25 MG tablet Take 1 tablet (6.25 mg total) by mouth 2 (two) times daily  with a meal.  . colchicine 0.6 MG tablet Take 1 tablet (0.6 mg total) by mouth as needed (gout attacks).  . ferrous sulfate 325 (65 FE) MG EC tablet Take 1 tablet (325 mg total) by mouth 2 (two) times daily.  . folic acid (FOLVITE) 1 MG tablet Take 1 tablet (1 mg total) by mouth daily.  . furosemide (LASIX) 40 MG tablet Take 1 tablet (40 mg total) by mouth 2 (two) times daily.  . Menthol, Topical Analgesic, (BENGAY EX) Apply 1 application topically daily as needed (pain).  . nitroGLYCERIN (NITROSTAT) 0.4 MG SL tablet Place 1 tablet (0.4 mg) under the tongue every 5 minutes as needed for chest pain for up to 3 doses/call 9-1-1 and MD if no relief  . potassium chloride SA (KLOR-CON) 20 MEQ tablet Take 1 tablet (20 mEq total) by mouth daily.   Current Facility-Administered Medications (Other)  Medication Route  . sodium chloride flush (NS) 0.9 % injection 3 mL Intravenous      REVIEW OF SYSTEMS: ROS    Positive for: Cardiovascular, Eyes   Negative for: Constitutional, Gastrointestinal, Neurological, Skin, Genitourinary, Musculoskeletal, HENT, Endocrine, Respiratory, Psychiatric, Allergic/Imm, Heme/Lymph   Last edited by Celine Mans, COA on 10/17/2019  9:56 AM. (History)       ALLERGIES Allergies  Allergen Reactions  . Atorvastatin Other (See Comments)    Joint pain (patient takes this, however)  . Other Other (See Comments)    Patient is sodium-restricted, per his admission    PAST MEDICAL HISTORY Past Medical History:  Diagnosis Date  . Anginal pain ([place])   . Anxiety   . Aortic stenosis    a. s/p bioprosthetic AVR in 2010  . Arthritis    gout  . CAD (coronary artery disease)    a. s/p CABG in 1992 b. redo CABG in 2004 c. cath in 2010 showing patent LIMA-LAD, free RIMA-OM, and SVG-OM, and 80% stenosis of small PLA branch  . Chest pain   . CHF (congestive heart failure) ([place])   . Dysrhythmia   . GERD (gastroesophageal reflux disease)    with gas and bloating probably  cause of chest pain  . H/O atrial flutter    a. s/p ablation and not on anticoagulation secondary to GIB and alcohol abuse.   . H/O: gout   . Hyperlipidemia   . Hypertension   . Hypertensive retinopathy    OU  . ICD (implantable cardiac defibrillator) infection, s/p removal of ICD 07/12/2012   re-implanted Medtronic BiV ICD, serial number [id number]   . Ischemic cardiomyopathy    a. EF previously 20-25%, improved to 40-45% by echo in 03/2013 b. 10/2017: echo showing of 30-35% with Grade 2 DD, normal function of AVR, moderate MR.   . Myocardial infarction ([place])   . 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 Shortness of breath    Past Surgical History:  Procedure Laterality Date  . BI-VENTRICULAR IMPLANTABLE CARDIOVERTER DEFIBRILLATOR Right 08/18/2012   Medtronic BiV ICD, serial number [id number] ; Procedure: BI-VENTRICULAR IMPLANTABLE CARDIOVERTER DEFIBRILLATOR  (CRT-D);  Surgeon: Evans Lance, MD;  Location: [facility] CATH LAB;  Service: Cardiovascular;  Laterality: Right;  . BIV ICD GENERATOR CHANGEOUT N/A 09/08/2018   Procedure: BIV ICD GENERATOR CHANGEOUT;  Surgeon: Sanda Klein, MD;  Location: [place] CV LAB;  Service: Cardiovascular;  Laterality: N/A;  . CARDIAC CATHETERIZATION  Dec 18, 2003   with a presentation of atrial flutter with rapid ventricular response. He had a cath and two stents to his PDA after his SVG to his RCA. His other graft were patent. He had LV with EF of 30%.   . CARDIOVERSION N/A 05/30/2019   Procedure: CARDIOVERSION;  Surgeon: Sanda Klein, MD;  Location: [facility] ENDOSCOPY;  Service: Cardiovascular;  Laterality: N/A;  . CARDIOVERSION N/A 09/30/2019   Procedure: CARDIOVERSION;  Surgeon: Sanda Klein, MD;  Location: [facility] ENDOSCOPY;  Service: Cardiovascular;  Laterality: N/A;  . CAROTID ENDARTERECTOMY  11/2002   left in December 2003 and known renal artery stenosis of 60-70%  . CATARACT EXTRACTION Bilateral   . CORONARY  ARTERY BYPASS GRAFT  1993   redo in 2000, he has had multiple MI previous to both procedures.  [name] EYE SURGERY Bilateral    Cat Sx OU  . IMPLANTABLE CARDIOVERTER DEFIBRILLATOR (ICD) GENERATOR CHANGE N/A 06/25/2012   Procedure: ICD GENERATOR CHANGE;  Surgeon: Evans Lance, MD;  Location: [facility] CATH LAB;  Service: Cardiovascular;  Laterality: N/A;  . VALVE REPLACEMENT  2010    FAMILY HISTORY Family History  Problem Relation Age of Onset  . CAD Brother     SOCIAL HISTORY Social History   Tobacco Use  . Smoking status: Never Smoker  . Smokeless tobacco: Current User    Types: Chew  Substance Use  Topics  . Alcohol use: Yes    Alcohol/week: 14.0 standard drinks    Types: 14 Cans of beer per week  . Drug use: No         OPHTHALMIC EXAM:  Base Eye Exam    Visual Acuity (Snellen - Linear)      Right Left   Dist [place] CF 20/25   Dist ph [place] NI NI       Tonometry (Tonopen, 9:59 AM)      Right Left   Pressure 17 9       Pupils      Dark Light Shape React APD   Right 3 3 Round Minimal None   Left 2 2 Round Minimal None       Visual Fields (Counting fingers)      Left Right    Full    Restrictions  Partial outer superior temporal, inferior temporal, superior nasal, inferior nasal deficiencies       Extraocular Movement      Right Left    Full, Ortho Full, Ortho       Neuro/Psych    Oriented x3: Yes   Mood/Affect: Normal       Dilation    Both eyes: 1.0% Mydriacyl, 2.5% Phenylephrine @ 9:59 AM        Slit Lamp and Fundus Exam    Slit Lamp Exam      Right Left   Lids/Lashes Dermatochalasis - upper lid, Meibomian gland dysfunction Dermatochalasis - upper lid, Meibomian gland dysfunction   Conjunctiva/Sclera Mild, temporal Pinguecula, white and quiet Mild, temporal Pinguecula, white and quiet   Cornea 2+ Punctate epithelial erosions 2+ Punctate epithelial erosions, tear film debris   Anterior Chamber Deep, 4+RBC, 1.51mm layered hypehema -- stable from prior Deep and  quiet   Iris Round and very poorly dilated to 2.21mm Round and poorly dilated to 19mm   Lens Posterior chamber intraocular lens, ?mild pseudophakodenesis, red heme overlying pupil Posterior chamber intraocular lens   Vitreous diffuse red VH Mild Vitreous syneresis       Fundus Exam      Right Left   Disc no view Pink and Sharp   C/D Ratio  0.2   Macula no view Flat, Good foveal reflex, No heme or edema   Vessels no view milid Vascular attenuation   Periphery no view Attached             IMAGING AND PROCEDURES  Imaging and Procedures for @TODAY @  OCT, Retina - OU - Both Eyes       Left Eye Quality was good. Central Foveal Thickness: 258. Progression has been stable. Findings include normal foveal contour, no IRF, no SRF, vitreomacular adhesion .   Notes *Images captured and stored on drive  Diagnosis / Impression:  OD: no view OS: NFP, no IRF/SRF; +VMA  Clinical management:  See below  Abbreviations: NFP - Normal foveal profile. CME - cystoid macular edema. PED - pigment epithelial detachment. IRF - intraretinal fluid. SRF - subretinal fluid. EZ - ellipsoid zone. ERM - epiretinal membrane. ORA - outer retinal atrophy. ORT - outer retinal tubulation. SRHM - subretinal hyper-reflective material        B-Scan Ultrasound - OD - Right Eye       Quality was good. Findings included vitreous hemorrhage, vitreous opacities.   Notes **Images stored on drive**  Impression: OD: persistent vitreous opacities consistent with hemorrhage -- mostly anterior; no obvious RT/RD or mass  ASSESSMENT/PLAN:    ICD-10-CM   1. Hyphema, right eye  H21.01 B-Scan Ultrasound - OD - Right Eye  2. Vitreous hemorrhage, right eye (HCC)  H43.11 B-Scan Ultrasound - OD - Right Eye  3. Retinal edema  H35.81 OCT, Retina - OU - Both Eyes  4. Essential hypertension  I10   5. Hypertensive retinopathy of both eyes  H35.033   6. Pseudophakia of both eyes  Z96.1     1-3.  Vitreous hemorrhage w/ layered hyphema OD  - pt with significant cardiac history and recent cardioversion on Eliquis -- pt has stopped Eliquis as of 10.21.20 -- cleared with cardiologist  - presents with 2 wk history of decreased vision OD -- was intermittent initially, but 2 days prior to presentation (~10.14.20), became stably poor without improvement  - exam shows poor dilation, layered hyphema and vitreous hemorrhage OD; mild pseudophakodonesis -- ?UGH syndrome  - s/p therapeutic AC tap (10.22.20) to remove some heme and lower IOP  - hyphema today slightly decreased to 1.34mm from 1.695mm on Thursday  - IOP decreased to 17 mmHg OD  - still no view of posterior pole due to heme--repeat b-scan 10.26.20 showed +vit opacities/heme, no obvious RD or mass -- majority of vit opacities are anterior close to IOL  - pt denies trauma or fall; suspect VH related to Eliquis use + 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 Hypertensive retinopathy OU  - discussed importance of tight BP control  - monitor  6. Pseudophakia OU  - s/p CE/IOL OU  - monitor   Ophthalmic Meds Ordered this visit:  No orders of the defined types were placed in this encounter.      Return in about 3 days (around 10/20/2019) for f/u layered hyphema OD, DFE, OCT.  There are no Patient Instructions on file for this visit.   Explained the diagnoses, plan, and follow up with the patient and they expressed understanding.  Patient expressed understanding of the importance of proper follow up care.    Electronically signed by: Herby AbrahamAshley English, COA  Karie ChimeraBrian G. Rickardo Brinegar, M.D., Ph.D. Diseases & Surgery of the Retina and Vitreous [facility]  I have reviewed the above documentation for accuracy and completeness, and I agree with the above. Karie ChimeraBrian G. Steffan Caniglia, M.D., Ph.D. 10/17/19 5:54 PM    Abbreviations: M myopia (nearsighted); A astigmatism; H hyperopia (farsighted); P presbyopia; Mrx spectacle prescription;  CTL contact lenses; OD right eye; OS left eye; OU both eyes  XT exotropia; ET esotropia; PEK punctate epithelial keratitis; PEE punctate epithelial erosions; DES dry eye syndrome; MGD meibomian gland dysfunction; ATs artificial tears; PFAT's preservative free artificial tears; NSC nuclear sclerotic cataract; PSC posterior subcapsular cataract; ERM epi-retinal membrane; PVD posterior vitreous detachment; RD retinal detachment; DM diabetes mellitus; DR diabetic retinopathy; NPDR non-proliferative diabetic retinopathy; PDR proliferative diabetic retinopathy; CSME clinically significant macular edema; DME diabetic macular edema; dbh dot blot hemorrhages; CWS cotton wool spot; POAG primary open angle glaucoma; C/D cup-to-disc ratio; HVF humphrey visual field; GVF goldmann  visual field; OCT optical coherence tomography; IOP intraocular pressure; BRVO Branch retinal vein occlusion; CRVO central retinal vein occlusion; CRAO central retinal artery occlusion; BRAO branch retinal artery occlusion; RT retinal tear; SB scleral buckle; PPV pars plana vitrectomy; VH Vitreous hemorrhage; PRP panretinal laser photocoagulation; IVK intravitreal kenalog; VMT vitreomacular traction; MH Macular hole;  NVD neovascularization of the disc; NVE neovascularization elsewhere; AREDS age related eye disease study; ARMD age related macular degeneration; POAG primary open angle glaucoma; EBMD epithelial/anterior basement membrane dystrophy; ACIOL anterior chamber intraocular lens; IOL intraocular lens; PCIOL  posterior chamber intraocular lens; Phaco/IOL phacoemulsification with intraocular lens placement; PRK photorefractive keratectomy; LASIK laser assisted in situ keratomileusis; HTN hypertension; DM diabetes mellitus; COPD chronic obstructive pulmonary disease

## 2019-10-18 NOTE — Progress Notes (Signed)
[facility] - Clinic Note  10/20/2019     CHIEF COMPLAINT Patient presents for Retina Follow Up   HISTORY OF PRESENT ILLNESS: [name] is a 78 y.o. male who presents to the clinic today for:   HPI    Retina Follow Up    Diagnosis: hyphema.  In right eye.  Severity is severe.  Duration of 3 days.  Since onset it is gradually improving.  I, the attending physician,  performed the HPI with the patient and updated documentation appropriately.          Comments    Patient states vision improving some OD. Can now see blinker light on car mirror with OD, but 3 days ago could not see it.        Last edited by Rennis ChrisZamora, Tasean Mancha, MD on 10/21/2019 12:20 AM. (History)    pt states used all his drops this morning, he is able to see peripherally, but not through his central vision  Referring physician: No referring provider defined for this encounter.  HISTORICAL INFORMATION:   Selected notes from the MEDICAL RECORD NUMBER    CURRENT MEDICATIONS: Current Outpatient Medications (Ophthalmic Drugs)  Medication Sig  . atropine 1 % ophthalmic solution Place 1 drop into the right eye 2 (two) times daily.  . brimonidine (ALPHAGAN) 0.2 % ophthalmic solution Place 1 drop into the right eye 2 (two) times daily.  . dorzolamide-timolol (COSOPT) 22.3-6.8 MG/ML ophthalmic solution Place 1 drop into the right eye 2 (two) times daily.  . prednisoLONE acetate (PRED FORTE) 1 % ophthalmic suspension Place 1 drop into the right eye every 2 (two) hours.   No current facility-administered medications for this visit.  (Ophthalmic Drugs)   Current Outpatient Medications (Other)  Medication Sig  . amiodarone (PACERONE) 200 MG tablet Take 1 tablet (200 mg total) by mouth daily.  [name]. atorvastatin (LIPITOR) 10 MG tablet TAKE 1 TABLET BY MOUTH EVERY DAY  . carvedilol (COREG) 6.25 MG tablet Take 1 tablet (6.25 mg total) by mouth 2 (two) times daily with a meal.  . colchicine 0.6 MG  tablet Take 1 tablet (0.6 mg total) by mouth as needed (gout attacks).  . ferrous sulfate 325 (65 FE) MG EC tablet Take 1 tablet (325 mg total) by mouth 2 (two) times daily.  . folic acid (FOLVITE) 1 MG tablet Take 1 tablet (1 mg total) by mouth daily.  . furosemide (LASIX) 40 MG tablet Take 1 tablet (40 mg total) by mouth 2 (two) times daily.  . Menthol, Topical Analgesic, (BENGAY EX) Apply 1 application topically daily as needed (pain).  . nitroGLYCERIN (NITROSTAT) 0.4 MG SL tablet Place 1 tablet (0.4 mg) under the tongue every 5 minutes as needed for chest pain for up to 3 doses/call 9-1-1 and MD if no relief  . potassium chloride SA (KLOR-CON) 20 MEQ tablet Take 1 tablet (20 mEq total) by mouth daily.   Current Facility-Administered Medications (Other)  Medication Route  . sodium chloride flush (NS) 0.9 % injection 3 mL Intravenous      REVIEW OF SYSTEMS: ROS    Positive for: Cardiovascular, Eyes   Negative for: Constitutional, Gastrointestinal, Neurological, Skin, Genitourinary, Musculoskeletal, HENT, Endocrine, Respiratory, Psychiatric, Allergic/Imm, Heme/Lymph   Last edited by Annalee GentaBarber, Daryl D, COT on 10/20/2019  7:53 AM. (History)       ALLERGIES Allergies  Allergen Reactions  . Atorvastatin Other (See Comments)    Joint pain (patient takes this, however)  . Other Other (  See Comments)    Patient is sodium-restricted, per his admission    PAST MEDICAL HISTORY Past Medical History:  Diagnosis Date  . Anginal pain (HCC)   . Anxiety   . Aortic stenosis    a. s/p bioprosthetic AVR in 2010  . Arthritis    gout  . CAD (coronary artery disease)    a. s/p CABG in 1992 b. redo CABG in 2004 c. cath in 2010 showing patent LIMA-LAD, free RIMA-OM, and SVG-OM, and 80% stenosis of small PLA branch  . Chest pain   . CHF (congestive heart failure) (HCC)   . Dysrhythmia   . GERD (gastroesophageal reflux disease)    with gas and bloating probably cause of chest pain  . H/O atrial  flutter    a. s/p ablation and not on anticoagulation secondary to GIB and alcohol abuse.   . H/O: gout   . Hyperlipidemia   . Hypertension   . Hypertensive retinopathy    OU  . ICD (implantable cardiac defibrillator) infection, s/p removal of ICD 07/12/2012   re-implanted Medtronic BiV ICD, serial number [id number]   . Ischemic cardiomyopathy    a. EF previously 20-25%, improved to 40-45% by echo in 03/2013 b. 10/2017: echo showing of 30-35% with Grade 2 DD, normal function of AVR, moderate MR.   . Myocardial infarction (HCC)   . 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 Shortness of breath    Past Surgical History:  Procedure Laterality Date  . BI-VENTRICULAR IMPLANTABLE CARDIOVERTER DEFIBRILLATOR Right 08/18/2012   Medtronic BiV ICD, serial number [id number] ; Procedure: BI-VENTRICULAR IMPLANTABLE CARDIOVERTER DEFIBRILLATOR  (CRT-D);  Surgeon: Marinus Maw, MD;  Location: [facility] CATH LAB;  Service: Cardiovascular;  Laterality: Right;  . BIV ICD GENERATOR CHANGEOUT N/A 09/08/2018   Procedure: BIV ICD GENERATOR CHANGEOUT;  Surgeon: Thurmon Fair, MD;  Location: [facility] INVASIVE CV LAB;  Service: Cardiovascular;  Laterality: N/A;  . CARDIAC CATHETERIZATION  Dec 18, 2003   with a presentation of atrial flutter with rapid ventricular response. He had a cath and two stents to his PDA after his SVG to his RCA. His other graft were patent. He had LV with EF of 30%.   . CARDIOVERSION N/A 05/30/2019   Procedure: CARDIOVERSION;  Surgeon: Thurmon Fair, MD;  Location: [facility] ENDOSCOPY;  Service: Cardiovascular;  Laterality: N/A;  . CARDIOVERSION N/A 09/30/2019   Procedure: CARDIOVERSION;  Surgeon: Thurmon Fair, MD;  Location: [facility] ENDOSCOPY;  Service: Cardiovascular;  Laterality: N/A;  . CAROTID ENDARTERECTOMY  11/2002   left in December 2003 and known renal artery stenosis of 60-70%  . CATARACT EXTRACTION Bilateral   . CORONARY ARTERY BYPASS GRAFT  1993   redo in  2000, he has had multiple MI previous to both procedures.  [name] EYE SURGERY Bilateral    Cat Sx OU  . IMPLANTABLE CARDIOVERTER DEFIBRILLATOR (ICD) GENERATOR CHANGE N/A 06/25/2012   Procedure: ICD GENERATOR CHANGE;  Surgeon: Marinus Maw, MD;  Location: [facility] CATH LAB;  Service: Cardiovascular;  Laterality: N/A;  . VALVE REPLACEMENT  2010    FAMILY HISTORY Family History  Problem Relation Age of Onset  . CAD Brother     SOCIAL HISTORY Social History   Tobacco Use  . Smoking status: Never Smoker  . Smokeless tobacco: Current User    Types: Chew  Substance Use Topics  . Alcohol use: Yes    Alcohol/week: 14.0 standard drinks    Types: 14 Cans of beer  per week  . Drug use: No         OPHTHALMIC EXAM:  Base Eye Exam    Visual Acuity (Snellen - Linear)      Right Left   Dist Day HM 20/25 +2   Dist ph [place] NI NI       Tonometry (Tonopen, 8:07 AM)      Right Left   Pressure 22 15       Pupils      Dark Light Shape React APD   Right 4 4 Round None None   Left 2 1 Round Slow None       Visual Fields (Counting fingers)      Left Right    Full    Restrictions  Total superior temporal, inferior temporal, superior nasal, inferior nasal deficiencies       Extraocular Movement      Right Left    Full, Ortho Full, Ortho       Neuro/Psych    Oriented x3: Yes   Mood/Affect: Normal       Dilation    Both eyes: 1.0% Mydriacyl, 2.5% Phenylephrine @ 8:07 AM        Slit Lamp and Fundus Exam    Slit Lamp Exam      Right Left   Lids/Lashes Dermatochalasis - upper lid, Meibomian gland dysfunction Dermatochalasis - upper lid, Meibomian gland dysfunction   Conjunctiva/Sclera Mild, temporal Pinguecula, white and quiet Mild, temporal Pinguecula, white and quiet   Cornea 2+ Punctate epithelial erosions 2+ Punctate epithelial erosions, tear film debris   Anterior Chamber Deep, 4+RBC, 1.21mm layered hypehema -- blood over pupil clearing, settling inferiorly Deep and quiet   Iris  Round and very poorly dilated to 3.49mm, slightly irregular Round and poorly dilated to 37mm   Lens Posterior chamber intraocular lens, ?mild pseudophakodenesis, red heme overlying pupil clearing Posterior chamber intraocular lens   Vitreous diffuse red VH Mild Vitreous syneresis       Fundus Exam      Right Left   Disc no view Pink and Sharp   C/D Ratio  0.2   Macula no view Flat, Good foveal reflex, No heme or edema   Vessels no view milid Vascular attenuation   Periphery no view Attached             IMAGING AND PROCEDURES  Imaging and Procedures for @TODAY @           ASSESSMENT/PLAN:    ICD-10-CM   1. Hyphema, right eye  H21.01   2. Vitreous hemorrhage, right eye (HCC)  H43.11   3. Retinal edema  H35.81   4. Essential hypertension  I10   5. Hypertensive retinopathy of both eyes  H35.033   6. Pseudophakia of both eyes  Z96.1     1-3. 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  - exam shows poor dilation, layered hyphema and vitreous hemorrhage OD; mild pseudophakodonesis -- ?UGH syndrome  - s/p therapeutic AC tap (10.22.20) to remove some heme and lower IOP  - repeat b-scan 10.26.20 showed +vit opacities/heme, no obvious RD or  mass -- majority of vit opacities are anterior close to IOL  - hyphema today slightly increased to 1.60mm from 1.63mm on Monday -- blood over pupil / IOL is clearing  - still no view of posterior pole due to heme-   - IOP increased to 22 mmHg OD  - recommend repeat therapeutic AC tap today (10.29.20)  - VH precautions reviewed -- minimize activities, keep  head elevated, avoid ASA/NSAIDs/blood thinners as able  - discussed possibility of needing surgery to clear heme from [facility] and vitreous, but tricky given extensive cardiac history and use of blood thinners  - cont  atropine OD BID   Increase Pred Forte OD Q2H   Cosopt BID OD   Brimonidine BID OD  - f/u Monday, November 2   4,5. Hypertensive retinopathy OU  - discussed importance of tight BP control  - monitor  6. Pseudophakia OU  - s/p CE/IOL OU  - monitor   Ophthalmic Meds Ordered this visit:  Meds ordered this encounter  Medications  . prednisoLONE acetate (PRED FORTE) 1 % ophthalmic suspension    Sig: Place 1 drop into the right eye every 2 (two) hours.    Dispense:  15 mL    Refill:  0       Return for f/u Nov. 2 at 830.  There are no Patient Instructions on file for this visit.   Explained the diagnoses, plan, and follow up with the patient and they expressed understanding.  Patient expressed understanding of the importance of proper follow up care.   Electronically signed by: Herby Abraham, COA  Karie Chimera, M.D., Ph.D. Diseases & Surgery of the Retina and Vitreous [facility]  I have reviewed the above documentation for accuracy and completeness, and I agree with the above. Karie Chimera, M.D., Ph.D. 10/21/19 12:24 AM   Abbreviations: M myopia (nearsighted); A astigmatism; H hyperopia (farsighted); P presbyopia; Mrx spectacle prescription;  CTL contact lenses; OD right eye; OS left eye; OU both eyes  XT exotropia; ET esotropia; PEK punctate epithelial keratitis; PEE punctate epithelial erosions; DES dry eye syndrome; MGD meibomian gland dysfunction; ATs artificial tears; PFAT's preservative free artificial tears; NSC nuclear sclerotic cataract; PSC posterior subcapsular cataract; ERM epi-retinal membrane; PVD posterior vitreous detachment; RD retinal detachment; DM diabetes mellitus; DR diabetic retinopathy; NPDR non-proliferative  diabetic retinopathy; PDR proliferative diabetic retinopathy; CSME clinically significant macular edema; DME diabetic macular edema; dbh dot blot hemorrhages; CWS cotton wool spot; POAG primary open angle glaucoma; C/D cup-to-disc ratio; HVF humphrey visual field; GVF goldmann visual field; OCT optical coherence tomography; IOP intraocular pressure; BRVO Branch retinal vein occlusion; CRVO central retinal vein occlusion; CRAO central retinal artery occlusion; BRAO branch retinal artery occlusion; RT retinal tear; SB scleral buckle; PPV pars plana vitrectomy; VH Vitreous hemorrhage; PRP panretinal laser photocoagulation; IVK intravitreal kenalog; VMT vitreomacular traction; MH Macular hole;  NVD neovascularization of the disc; NVE neovascularization elsewhere; AREDS age related eye disease study; ARMD age related macular degeneration; POAG primary open angle glaucoma; EBMD epithelial/anterior basement membrane dystrophy; ACIOL anterior chamber intraocular lens; IOL intraocular lens; PCIOL posterior chamber intraocular lens; Phaco/IOL phacoemulsification with intraocular lens placement; PRK photorefractive keratectomy; LASIK laser assisted in situ keratomileusis; HTN hypertension; DM diabetes mellitus; COPD chronic obstructive pulmonary disease

## 2019-10-20 ENCOUNTER — Ambulatory Visit (INDEPENDENT_AMBULATORY_CARE_PROVIDER_SITE_OTHER): Payer: Medicare Other | Admitting: Ophthalmology

## 2019-10-20 ENCOUNTER — Encounter (INDEPENDENT_AMBULATORY_CARE_PROVIDER_SITE_OTHER): Payer: Self-pay | Admitting: Ophthalmology

## 2019-10-20 ENCOUNTER — Other Ambulatory Visit: Payer: Self-pay

## 2019-10-20 DIAGNOSIS — Z961 Presence of intraocular lens: Secondary | ICD-10-CM

## 2019-10-20 DIAGNOSIS — H4311 Vitreous hemorrhage, right eye: Secondary | ICD-10-CM | POA: Diagnosis not present

## 2019-10-20 DIAGNOSIS — I10 Essential (primary) hypertension: Secondary | ICD-10-CM | POA: Diagnosis not present

## 2019-10-20 DIAGNOSIS — H35033 Hypertensive retinopathy, bilateral: Secondary | ICD-10-CM

## 2019-10-20 DIAGNOSIS — H2101 Hyphema, right eye: Secondary | ICD-10-CM

## 2019-10-20 DIAGNOSIS — H3581 Retinal edema: Secondary | ICD-10-CM

## 2019-10-20 MED ORDER — PREDNISOLONE ACETATE 1 % OP SUSP: 1.0000 [drp] | OPHTHALMIC | 0 refills | Status: DC

## 2019-10-20 NOTE — Progress Notes (Signed)
[facility] - Clinic Note  10/24/2019     CHIEF COMPLAINT Patient presents for Retina Follow Up   HISTORY OF PRESENT ILLNESS: [name] is a 78 y.o. male who presents to the clinic today for:   HPI    Retina Follow Up    Patient presents with  Other.  In right eye.  This started 2 weeks ago.  Severity is moderate.  Duration of 4 days.  Since onset it is rapidly improving.  I, the attending physician,  performed the HPI with the patient and updated documentation appropriately.          Comments    78 y/o male pt here for 4 day f/u for [facility] w/hyphema OD.  VA OD rapidly improved over the weekend.  No change in [place] OS.  Denies pain, flashes, new floaters.  Atropine BID OD PF QID OD Cosopt BID OD Brimonidine BID OD       Last edited by Rennis Chris, MD on 10/24/2019  9:06 AM. (History)    pt states his vision is getting better  Referring physician: No referring provider defined for this encounter.  HISTORICAL INFORMATION: vitre  Selected notes from the MEDICAL RECORD NUMBER    CURRENT MEDICATIONS: Current Outpatient Medications (Ophthalmic Drugs)  Medication Sig  . atropine 1 % ophthalmic solution Place 1 drop into the right eye 2 (two) times daily.  . brimonidine (ALPHAGAN) 0.2 % ophthalmic solution Place 1 drop into the right eye 2 (two) times daily.  . dorzolamide-timolol (COSOPT) 22.3-6.8 MG/ML ophthalmic solution Place 1 drop into the right eye 2 (two) times daily.  . prednisoLONE acetate (PRED FORTE) 1 % ophthalmic suspension Place 1 drop into the right eye every 2 (two) hours.   No current facility-administered medications for this visit.  (Ophthalmic Drugs)   Current Outpatient Medications (Other)  Medication Sig  . amiodarone (PACERONE) 200 MG tablet Take 1 tablet (200 mg total) by mouth daily.  [name] atorvastatin (LIPITOR) 10 MG tablet TAKE 1 TABLET BY MOUTH EVERY DAY  . carvedilol (COREG) 6.25 MG tablet Take 1 tablet (6.25 mg total) by  mouth 2 (two) times daily with a meal.  . colchicine 0.6 MG tablet Take 1 tablet (0.6 mg total) by mouth as needed (gout attacks).  . ferrous sulfate 325 (65 FE) MG EC tablet Take 1 tablet (325 mg total) by mouth 2 (two) times daily.  . folic acid (FOLVITE) 1 MG tablet Take 1 tablet (1 mg total) by mouth daily.  . furosemide (LASIX) 40 MG tablet Take 1 tablet (40 mg total) by mouth 2 (two) times daily.  . Menthol, Topical Analgesic, (BENGAY EX) Apply 1 application topically daily as needed (pain).  . nitroGLYCERIN (NITROSTAT) 0.4 MG SL tablet Place 1 tablet (0.4 mg) under the tongue every 5 minutes as needed for chest pain for up to 3 doses/call 9-1-1 and MD if no relief  . potassium chloride SA (KLOR-CON) 20 MEQ tablet Take 1 tablet (20 mEq total) by mouth daily.   Current Facility-Administered Medications (Other)  Medication Route  . sodium chloride flush (NS) 0.9 % injection 3 mL Intravenous      REVIEW OF SYSTEMS: ROS    Positive for: Genitourinary, Cardiovascular, Eyes   Negative for: Constitutional, Gastrointestinal, Neurological, Skin, Musculoskeletal, HENT, Endocrine, Respiratory, Psychiatric, Allergic/Imm, Heme/Lymph   Last edited by Celine Mans, COA on 10/24/2019  8:41 AM. (History)       ALLERGIES Allergies  Allergen Reactions  .  Atorvastatin Other (See Comments)    Joint pain (patient takes this, however)  . Other Other (See Comments)    Patient is sodium-restricted, per his admission    PAST MEDICAL HISTORY Past Medical History:  Diagnosis Date  . Anginal pain (HCC)   . Anxiety   . Aortic stenosis    a. s/p bioprosthetic AVR in 2010  . Arthritis    gout  . CAD (coronary artery disease)    a. s/p CABG in 1992 b. redo CABG in 2004 c. cath in 2010 showing patent LIMA-LAD, free RIMA-OM, and SVG-OM, and 80% stenosis of small PLA branch  . Chest pain   . CHF (congestive heart failure) (HCC)   . Dysrhythmia   . GERD (gastroesophageal reflux disease)    with  gas and bloating probably cause of chest pain  . H/O atrial flutter    a. s/p ablation and not on anticoagulation secondary to GIB and alcohol abuse.   . H/O: gout   . Hyperlipidemia   . Hypertension   . Hypertensive retinopathy    OU  . ICD (implantable cardiac defibrillator) infection, s/p removal of ICD 07/12/2012   re-implanted Medtronic BiV ICD, serial number [id number]   . Ischemic cardiomyopathy    a. EF previously 20-25%, improved to 40-45% by echo in 03/2013 b. 10/2017: echo showing of 30-35% with Grade 2 DD, normal function of AVR, moderate MR.   . Myocardial infarction (HCC)   . 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 Shortness of breath    Past Surgical History:  Procedure Laterality Date  . BI-VENTRICULAR IMPLANTABLE CARDIOVERTER DEFIBRILLATOR Right 08/18/2012   Medtronic BiV ICD, serial number [id number] ; Procedure: BI-VENTRICULAR IMPLANTABLE CARDIOVERTER DEFIBRILLATOR  (CRT-D);  Surgeon: Marinus MawGregg W Taylor, MD;  [facility];  Service: Cardiovascular;  Laterality: Right;  . BIV ICD GENERATOR CHANGEOUT N/A 09/08/2018   Procedure: BIV ICD GENERATOR CHANGEOUT;  Surgeon: Thurmon Fairroitoru, Mihai, MD;  Location: [facility] INVASIVE CV LAB;  Service: Cardiovascular;  Laterality: N/A;  . CARDIAC CATHETERIZATION  Dec 18, 2003   with a presentation of atrial flutter with rapid ventricular response. He had a cath and two stents to his PDA after his SVG to his RCA. His other graft were patent. He had LV with EF of 30%.   . CARDIOVERSION N/A 05/30/2019   Procedure: CARDIOVERSION;  Surgeon: Thurmon Fairroitoru, Mihai, MD;  Location: [facility] ENDOSCOPY;  Service: Cardiovascular;  Laterality: N/A;  . CARDIOVERSION N/A 09/30/2019   Procedure: CARDIOVERSION;  Surgeon: Thurmon Fairroitoru, Mihai, MD;  Location: [facility] ENDOSCOPY;  Service: Cardiovascular;  Laterality: N/A;  . CAROTID ENDARTERECTOMY  11/2002   left in December 2003 and known renal artery stenosis of 60-70%  . CATARACT EXTRACTION  Bilateral   . CORONARY ARTERY BYPASS GRAFT  1993   redo in 2000, he has had multiple MI previous to both procedures.  [name]. EYE SURGERY Bilateral    Cat Sx OU  . IMPLANTABLE CARDIOVERTER DEFIBRILLATOR (ICD) GENERATOR CHANGE N/A 06/25/2012   Procedure: ICD GENERATOR CHANGE;  Surgeon: Marinus MawGregg W Taylor, MD;  [facility];  Service: Cardiovascular;  Laterality: N/A;  . VALVE REPLACEMENT  2010    FAMILY HISTORY Family History  Problem Relation Age of Onset  . CAD Brother     SOCIAL HISTORY Social History   Tobacco Use  . Smoking status: Never Smoker  . Smokeless tobacco: Current User    Types: Chew  Substance Use Topics  . Alcohol  use: Yes    Alcohol/week: 14.0 standard drinks    Types: 14 Cans of beer per week  . Drug use: No         OPHTHALMIC EXAM:  Base Eye Exam    Visual Acuity (Snellen - Linear)      Right Left   Dist [place] 20/60 20/25 -2   Dist ph [place] 20/50 -2 NI       Tonometry (Tonopen, 8:43 AM)      Right Left   Pressure 16 15       Pupils      Dark Light Shape React APD   Right 4 3 Round Minimal None   Left 3 2 Round Minimal None       Visual Fields (Counting fingers)      Left Right    Full Full       Extraocular Movement      Right Left    Full, Ortho Full, Ortho       Neuro/Psych    Oriented x3: Yes   Mood/Affect: Normal       Dilation    Both eyes: 1.0% Mydriacyl, 2.5% Phenylephrine @ 8:43 AM        Slit Lamp and Fundus Exam    Slit Lamp Exam      Right Left   Lids/Lashes Dermatochalasis - upper lid, Meibomian gland dysfunction Dermatochalasis - upper lid, Meibomian gland dysfunction   Conjunctiva/Sclera Mild, temporal Pinguecula, white and quiet Mild, temporal Pinguecula, white and quiet   Cornea 2+ Punctate epithelial erosions 2+ Punctate epithelial erosions, tear film debris   Anterior Chamber Deep, 4+RBC, 1mm layered hypehema -- blood over pupil and lens clearing, settling inferiorly Deep and quiet   Iris Round and very poorly  dilated to 4mm, slightly irregular, vertical, linearTID at 0300 with +heme Round and poorly dilated to 4mm   Lens Posterior chamber intraocular lens, ?mild pseudophakodenesis, red heme overlying pupil clearing, blood clot around superior optic Posterior chamber intraocular lens   Vitreous diffuse red VH Mild Vitreous syneresis       Fundus Exam      Right Left   Disc hazy view Pink and Sharp   C/D Ratio  0.2   Macula red reflex, no details visible Flat, Good foveal reflex, No heme or edema   Vessels hazy view milid Vascular attenuation   Periphery hazy view; red reflex Attached             IMAGING AND PROCEDURES  Imaging and Procedures for @  OCT, Retina - OU - Both Eyes       Right Eye Quality was borderline. Progression has no prior data. Findings include no IRF, normal foveal contour, no SRF (Vitreous opacities).   Left Eye Quality was good. Central Foveal Thickness: 259. Progression has been stable. Findings include normal foveal contour, no IRF, no SRF, vitreomacular adhesion .   Notes *Images captured and stored on drive  Diagnosis / Impression:  OD: first usable scan of right eye--NFP; no IRF/SRF; +Vitreous opacities OS: NFP, no IRF/SRF; +VMA  Clinical management:  See below  Abbreviations: NFP - Normal foveal profile. CME - cystoid macular edema. PED - pigment epithelial detachment. IRF - intraretinal fluid. SRF - subretinal fluid. EZ - ellipsoid zone. ERM - epiretinal membrane. ORA - outer retinal atrophy. ORT - outer retinal tubulation. SRHM - subretinal hyper-reflective material                 ASSESSMENT/PLAN:  ICD-10-CM   1. Hyphema, right eye  H21.01   2. Vitreous hemorrhage, right eye (HCC)  H43.11   3. Retinal edema  H35.81 OCT, Retina - OU - Both Eyes  4. Essential hypertension  I10   5. Hypertensive retinopathy of both eyes  H35.033   6. Pseudophakia of both eyes  Z96.1     1-3. Vitreous hemorrhage w/ layered hyphema OD  - pt  with significant cardiac history and recent cardioversion on Eliquis -- pt has stopped Eliquis as of 10.21.20 -- cleared with cardiologist  - initially presented with 2 wk history of decreased vision OD -- was intermittent initially, but 2 days prior to presentation (~10.14.20), became stably poor without improvement  - exam shows poor dilation, layered hyphema and vitreous hemorrhage OD;   - today, can see large nasal TID w/ +heme within and mild pseudophakodonesis -- UGH syndrome becoming more apparent  - s/p therapeutic AC tap (10.22.20 and 10.29.20) to remove some heme and lower IOP  - hyphema today decreased to 1mm from 1.22mm on Friday  - IOP okay at 16 mmHg OD  - still no view of posterior pole due to heme-  -repeat b-scan 10.26.20 showed +vit opacities/heme, no obvious RD or mass -- majority of vit opacities are anterior close to IOL  - pt denies trauma or fall; suspect VH related to Eliquis use + 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 Hypertensive retinopathy OU  - discussed importance of tight BP control  - monitor  6. Pseudophakia OU  - s/p CE/IOL OU  - monitor   Ophthalmic Meds Ordered this visit:  No orders of the defined types were placed in this encounter.      Return for next tues/weds hyphema OD.  There are no Patient Instructions on file for this visit.   Explained the diagnoses, plan, and follow up with the patient and they expressed understanding.  Patient expressed understanding of the importance of proper follow up care.   Electronically signed by: Herby Abraham, COA  This document serves as a record of  services personally performed by Karie Chimera, MD, PhD. It was created on their behalf by Laurian Brim, OA, an ophthalmic assistant. The creation of this record is the provider's dictation and/or activities during the visit.    Electronically signed by: Laurian Brim, OA 11.02.2020 12:26 PM   Karie Chimera, M.D., Ph.D. Diseases & Surgery of the Retina and Vitreous [facility]  I have reviewed the above documentation for accuracy and completeness, and I agree with the above. Karie Chimera, M.D., Ph.D. 10/24/19 12:26 PM    Abbreviations: M myopia (nearsighted); A astigmatism; H hyperopia (farsighted); P presbyopia; Mrx spectacle prescription;  CTL contact lenses; OD right eye; OS left eye; OU both eyes  XT exotropia; ET esotropia; PEK punctate epithelial keratitis; PEE punctate epithelial erosions; DES dry eye syndrome; MGD meibomian gland dysfunction; ATs artificial tears; PFAT's preservative free artificial tears; NSC nuclear sclerotic cataract; PSC posterior subcapsular cataract; ERM epi-retinal membrane; PVD posterior vitreous detachment; RD retinal detachment; DM diabetes mellitus; DR diabetic  retinopathy; NPDR non-proliferative diabetic retinopathy; PDR proliferative diabetic retinopathy; CSME clinically significant macular edema; DME diabetic macular edema; dbh dot blot hemorrhages; CWS cotton wool spot; POAG primary open angle glaucoma; C/D cup-to-disc ratio; HVF humphrey visual field; GVF goldmann visual field; OCT optical coherence tomography; IOP intraocular pressure; BRVO Branch retinal vein occlusion; CRVO central retinal vein occlusion; CRAO central retinal artery occlusion; BRAO branch retinal artery occlusion; RT retinal tear; SB scleral buckle; PPV pars plana vitrectomy; VH Vitreous hemorrhage; PRP panretinal laser photocoagulation; IVK intravitreal kenalog; VMT vitreomacular traction; MH Macular hole;  NVD neovascularization of the disc; NVE  neovascularization elsewhere; AREDS age related eye disease study; ARMD age related macular degeneration; POAG primary open angle glaucoma; EBMD epithelial/anterior basement membrane dystrophy; ACIOL anterior chamber intraocular lens; IOL intraocular lens; PCIOL posterior chamber intraocular lens; Phaco/IOL phacoemulsification with intraocular lens placement; [place] photorefractive keratectomy; LASIK laser assisted in situ keratomileusis; HTN hypertension; DM diabetes mellitus; COPD chronic obstructive pulmonary disease

## 2019-10-24 ENCOUNTER — Ambulatory Visit (INDEPENDENT_AMBULATORY_CARE_PROVIDER_SITE_OTHER): Payer: Medicare Other | Admitting: Ophthalmology

## 2019-10-24 ENCOUNTER — Encounter (INDEPENDENT_AMBULATORY_CARE_PROVIDER_SITE_OTHER): Payer: Self-pay | Admitting: Ophthalmology

## 2019-10-24 DIAGNOSIS — H2101 Hyphema, right eye: Secondary | ICD-10-CM | POA: Diagnosis not present

## 2019-10-24 DIAGNOSIS — H3581 Retinal edema: Secondary | ICD-10-CM | POA: Diagnosis not present

## 2019-10-24 DIAGNOSIS — H35033 Hypertensive retinopathy, bilateral: Secondary | ICD-10-CM

## 2019-10-24 DIAGNOSIS — I10 Essential (primary) hypertension: Secondary | ICD-10-CM

## 2019-10-24 DIAGNOSIS — H4311 Vitreous hemorrhage, right eye: Secondary | ICD-10-CM

## 2019-10-24 DIAGNOSIS — Z961 Presence of intraocular lens: Secondary | ICD-10-CM

## 2019-10-27 NOTE — Progress Notes (Signed)
[facility] - Clinic Note  11/01/2019     CHIEF COMPLAINT Patient presents for Retina Follow Up   HISTORY OF PRESENT ILLNESS: [name] is a 78 y.o. male who presents to the clinic today for:   HPI    Retina Follow Up    Patient presents with  Other (hyphema).  In right eye.  Severity is moderate.  Duration of 8 days.  Since onset it is gradually improving.  I, the attending physician,  performed the HPI with the patient and updated documentation appropriately.          Comments    Patient states vision improved OD. Ran out of Pred Forte and atropine yesterday. Still using Cosopt and brimonidine bid OD       Last edited by Rennis Chris, MD on 11/01/2019  9:32 AM. (History)    pt states his vision is getting better  Referring physician: No referring provider defined for this encounter.  HISTORICAL INFORMATION: vitre  Selected notes from the MEDICAL RECORD NUMBER    CURRENT MEDICATIONS: Current Outpatient Medications (Ophthalmic Drugs)  Medication Sig  . brimonidine (ALPHAGAN) 0.2 % ophthalmic solution Place 1 drop into the right eye 2 (two) times daily.  . dorzolamide-timolol (COSOPT) 22.3-6.8 MG/ML ophthalmic solution Place 1 drop into the right eye 2 (two) times daily.  [name] atropine 1 % ophthalmic solution Place 1 drop into the right eye 2 (two) times daily.  . prednisoLONE acetate (PRED FORTE) 1 % ophthalmic suspension Place 1 drop into the right eye 4 (four) times daily.   No current facility-administered medications for this visit.  (Ophthalmic Drugs)   Current Outpatient Medications (Other)  Medication Sig  . amiodarone (PACERONE) 200 MG tablet Take 1 tablet (200 mg total) by mouth daily.  [name] atorvastatin (LIPITOR) 10 MG tablet TAKE 1 TABLET BY MOUTH EVERY DAY  . carvedilol (COREG) 6.25 MG tablet Take 1 tablet (6.25 mg total) by mouth 2 (two) times daily with a meal.  . colchicine 0.6 MG tablet Take 1 tablet (0.6 mg total) by mouth as  needed (gout attacks).  . ferrous sulfate 325 (65 FE) MG EC tablet Take 1 tablet (325 mg total) by mouth 2 (two) times daily.  . folic acid (FOLVITE) 1 MG tablet Take 1 tablet (1 mg total) by mouth daily.  . furosemide (LASIX) 40 MG tablet Take 1 tablet (40 mg total) by mouth 2 (two) times daily.  . Menthol, Topical Analgesic, (BENGAY EX) Apply 1 application topically daily as needed (pain).  . nitroGLYCERIN (NITROSTAT) 0.4 MG SL tablet Place 1 tablet (0.4 mg) under the tongue every 5 minutes as needed for chest pain for up to 3 doses/call 9-1-1 and MD if no relief  . potassium chloride SA (KLOR-CON) 20 MEQ tablet Take 1 tablet (20 mEq total) by mouth daily.   Current Facility-Administered Medications (Other)  Medication Route  . sodium chloride flush (NS) 0.9 % injection 3 mL Intravenous      REVIEW OF SYSTEMS: ROS    Positive for: Genitourinary, Cardiovascular, Eyes   Negative for: Constitutional, Gastrointestinal, Neurological, Skin, Musculoskeletal, HENT, Endocrine, Respiratory, Psychiatric, Allergic/Imm, Heme/Lymph   Last edited by Annalee Genta D, COT on 11/01/2019  8:51 AM. (History)       ALLERGIES Allergies  Allergen Reactions  . Atorvastatin Other (See Comments)    Joint pain (patient takes this, however)  . Other Other (See Comments)    Patient is sodium-restricted, per his admission  PAST MEDICAL HISTORY Past Medical History:  Diagnosis Date  . Anginal pain ([place])   . Anxiety   . Aortic stenosis    a. s/p bioprosthetic AVR in 2010  . Arthritis    gout  . CAD (coronary artery disease)    a. s/p CABG in 1992 b. redo CABG in 2004 c. cath in 2010 showing patent LIMA-LAD, free RIMA-OM, and SVG-OM, and 80% stenosis of small PLA branch  . Chest pain   . CHF (congestive heart failure) ([place])   . Dysrhythmia   . GERD (gastroesophageal reflux disease)    with gas and bloating probably cause of chest pain  . H/O atrial flutter    a. s/p ablation and not on  anticoagulation secondary to GIB and alcohol abuse.   . H/O: gout   . Hyperlipidemia   . Hypertension   . Hypertensive retinopathy    OU  . ICD (implantable cardiac defibrillator) infection, s/p removal of ICD 07/12/2012   re-implanted Medtronic BiV ICD, serial number [id number]   . Ischemic cardiomyopathy    a. EF previously 20-25%, improved to 40-45% by echo in 03/2013 b. 10/2017: echo showing of 30-35% with Grade 2 DD, normal function of AVR, moderate MR.   . Myocardial infarction ([place])   . 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 Shortness of breath    Past Surgical History:  Procedure Laterality Date  . BI-VENTRICULAR IMPLANTABLE CARDIOVERTER DEFIBRILLATOR Right 08/18/2012   Medtronic BiV ICD, serial number [id number] ; Procedure: BI-VENTRICULAR IMPLANTABLE CARDIOVERTER DEFIBRILLATOR  (CRT-D);  Surgeon: Evans Lance, MD;  Location: [facility] CATH LAB;  Service: Cardiovascular;  Laterality: Right;  . BIV ICD GENERATOR CHANGEOUT N/A 09/08/2018   Procedure: BIV ICD GENERATOR CHANGEOUT;  Surgeon: Sanda Klein, MD;  Location: [place] CV LAB;  Service: Cardiovascular;  Laterality: N/A;  . CARDIAC CATHETERIZATION  Dec 18, 2003   with a presentation of atrial flutter with rapid ventricular response. He had a cath and two stents to his PDA after his SVG to his RCA. His other graft were patent. He had LV with EF of 30%.   . CARDIOVERSION N/A 05/30/2019   Procedure: CARDIOVERSION;  Surgeon: Sanda Klein, MD;  Location: [place] ENDOSCOPY;  Service: Cardiovascular;  Laterality: N/A;  . CARDIOVERSION N/A 09/30/2019   Procedure: CARDIOVERSION;  Surgeon: Sanda Klein, MD;  Location: [place] ENDOSCOPY;  Service: Cardiovascular;  Laterality: N/A;  . CAROTID ENDARTERECTOMY  11/2002   left in December 2003 and known renal artery stenosis of 60-70%  . CATARACT EXTRACTION Bilateral   . CORONARY ARTERY BYPASS GRAFT  1993   redo in 2000, he has had multiple MI previous to  both procedures.  [name] EYE SURGERY Bilateral    Cat Sx OU  . IMPLANTABLE CARDIOVERTER DEFIBRILLATOR (ICD) GENERATOR CHANGE N/A 06/25/2012   Procedure: ICD GENERATOR CHANGE;  Surgeon: Evans Lance, MD;  Location[facility] CATH LAB;  Service: Cardiovascular;  Laterality: N/A;  . VALVE REPLACEMENT  2010    FAMILY HISTORY Family History  Problem Relation Age of Onset  . CAD Brother     SOCIAL HISTORY Social History   Tobacco Use  . Smoking status: Never Smoker  . Smokeless tobacco: Current User    Types: Chew  Substance Use Topics  . Alcohol use: Yes    Alcohol/week: 14.0 standard drinks    Types: 14 Cans of beer per week  . Drug use: No  OPHTHALMIC EXAM:  Base Eye Exam    Visual Acuity (Snellen - Linear)      Right Left   Dist [place] 20/50 -1 20/25 -2   Dist ph [place] 20/25 -2 20/20 -1       Tonometry (Tonopen, 9:05 AM)      Right Left   Pressure 14 14       Pupils      Dark Light Shape React APD   Right 3 2 Round Minimal None   Left 4 3 Round Minimal None       Visual Fields (Counting fingers)      Left Right    Full Full       Extraocular Movement      Right Left    Full, Ortho Full, Ortho       Neuro/Psych    Oriented x3: Yes   Mood/Affect: Normal       Dilation    Both eyes: 1.0% Mydriacyl, 2.5% Phenylephrine @ 9:05 AM        Slit Lamp and Fundus Exam    Slit Lamp Exam      Right Left   Lids/Lashes Dermatochalasis - upper lid, Meibomian gland dysfunction Dermatochalasis - upper lid, Meibomian gland dysfunction   Conjunctiva/Sclera Mild, temporal Pinguecula, white and quiet Mild, temporal Pinguecula, white and quiet   Cornea 2+ Punctate epithelial erosions 2+ Punctate epithelial erosions, tear film debris   Anterior Chamber Deep, 3-4+RBC, layered hyphema resolved -- blood over pupil and lens clearing Deep and quiet   Iris Round and very poorly dilated to 4mm, slightly irregular, vertical, linear TID at 0300 with +heme Round and poorly dilated to 4mm    Lens Posterior chamber intraocular lens, ?mild pseudophakodenesis, red heme overlying pupil cleared, blood clot around superior optic improving Posterior chamber intraocular lens   Vitreous Mild Vitreous syneresis; no heme Mild Vitreous syneresis       Fundus Exam      Right Left   Disc pink, sharp Pink and Sharp   C/D Ratio 0.2 0.2   Macula flat;  Flat, Good foveal reflex, No heme or edema   Vessels mild attenuation milid Vascular attenuation   Periphery attached; no heme Attached           Refraction    Manifest Refraction      Sphere Cylinder Axis Dist VA   Right -1.00 +1.75 167 20/20-2   Left -0.50 +0.75 180 20/20-2          IMAGING AND PROCEDURES  Imaging and Procedures for @  OCT, Retina - OU - Both Eyes       Right Eye Quality was good. Central Foveal Thickness: 284. Progression has improved. Findings include no IRF, normal foveal contour, no SRF, vitreomacular adhesion  (Partial PVD).   Left Eye Quality was good. Central Foveal Thickness: 262. Progression has been stable. Findings include normal foveal contour, no IRF, no SRF, vitreomacular adhesion .   Notes *Images captured and stored on drive  Diagnosis / Impression:  OD: NFP; no IRF/SRF; partial PVD OS: NFP, no IRF/SRF; +VMA  Clinical management:  See below  Abbreviations: NFP - Normal foveal profile. CME - cystoid macular edema. PED - pigment epithelial detachment. IRF - intraretinal fluid. SRF - subretinal fluid. EZ - ellipsoid zone. ERM - epiretinal membrane. ORA - outer retinal atrophy. ORT - outer retinal tubulation. SRHM - subretinal hyper-reflective material  ASSESSMENT/PLAN:    ICD-10-CM   1. Hyphema, right eye  H21.01   2. Vitreous hemorrhage, right eye (HCC)  H43.11   3. Retinal edema  H35.81 OCT, Retina - OU - Both Eyes  4. Essential hypertension  I10   5. Hypertensive retinopathy of both eyes  H35.033   6. Pseudophakia of both eyes  Z96.1     1-3.  Vitreous hemorrhage w/ layered hyphema OD  - pt with significant cardiac history and recent cardioversion on Eliquis -- pt has stopped Eliquis as of 10.21.20 -- cleared with cardiologist  - initially presented with 2 wk history of decreased vision OD -- was intermittent initially, but 2 days prior to presentation (~10.14.20), became stably poor without improvement  - initial exam showed poor dilation, layered hyphema and vitreous hemorrhage OD;   - large nasal TID w/ +heme within and mild pseudophakodonesis -- UGH syndrome becoming more apparent  - s/p therapeutic AC tap (10.22.20 and 10.29.20) to remove some heme and lower IOP  - today, layered hyphema resolved -- now just with 3-4+ microhyphema  - IOP okay at 14 mmHg OD  - VH cleared -- posterior pole without significant pathology  - OCT essentially normal OD  - prior b-scans showed +vit opacities/heme, no obvious RD or mass -- majority of vit opacities are anterior close to IOL  - pt denies trauma or fall; suspect VH related to Eliquis use + 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 Hypertensive retinopathy OU  - discussed importance of tight BP control  - monitor  6. Pseudophakia OU  - s/p CE/IOL OU  - monitor   Ophthalmic Meds Ordered this visit:  Meds ordered this encounter  Medications  . prednisoLONE acetate (PRED FORTE) 1 % ophthalmic suspension    Sig: Place 1 drop into the right eye 4 (four) times daily.    Dispense:  15 mL    Refill:  1       Return for 1-2 wks, Dilated Exam, OCT.  There are no Patient Instructions on file for this visit.   Explained the diagnoses, plan, and follow up with the patient and they expressed understanding.  Patient expressed understanding of the importance of proper follow up care.    This document serves as a record of services personally performed by Karie Chimera, MD, PhD. It was created on their behalf by Cristopher Estimable, COT an ophthalmic technician. The creation of this record is the provider's dictation and/or activities during the visit.    Electronically signed by: Cristopher Estimable, COT 10/27/19 @ 12:56 PM   This document serves as a record of services personally performed by Karie Chimera, MD, PhD. It was created on their behalf by Laurian Brim, OA, an ophthalmic assistant. The creation of this record is the provider's dictation and/or activities during the visit.    Electronically signed by: Laurian Brim, OA 11.10.2020 12:56 PM  Karie Chimera, M.D., Ph.D. Diseases & Surgery of the Retina and Vitreous [facility]   I have reviewed the above documentation for accuracy and completeness, and I agree with the above. Karie Chimera, M.D., Ph.D. 11/01/19 12:56 PM    Abbreviations: M myopia (nearsighted); A astigmatism; H hyperopia (farsighted); P presbyopia;  Mrx spectacle prescription;  CTL contact lenses; OD right eye; OS left eye; OU both eyes  XT exotropia; ET esotropia; PEK punctate epithelial keratitis; PEE punctate epithelial erosions; DES dry eye syndrome; MGD meibomian gland dysfunction; ATs artificial tears; PFAT's preservative free artificial tears; NSC nuclear sclerotic cataract; PSC posterior subcapsular cataract; ERM epi-retinal membrane; PVD posterior vitreous detachment; RD retinal detachment; DM diabetes mellitus; DR diabetic retinopathy; NPDR non-proliferative diabetic retinopathy; PDR proliferative diabetic retinopathy; CSME clinically significant macular edema; DME diabetic macular edema; dbh dot blot hemorrhages; CWS cotton wool spot; POAG primary open angle glaucoma; C/D cup-to-disc ratio; HVF humphrey visual field; GVF goldmann visual field; OCT optical coherence tomography; IOP intraocular pressure; BRVO Branch retinal vein  occlusion; CRVO central retinal vein occlusion; CRAO central retinal artery occlusion; BRAO branch retinal artery occlusion; RT retinal tear; SB scleral buckle; PPV pars plana vitrectomy; VH Vitreous hemorrhage; PRP panretinal laser photocoagulation; IVK intravitreal kenalog; VMT vitreomacular traction; MH Macular hole;  NVD neovascularization of the disc; NVE neovascularization elsewhere; AREDS age related eye disease study; ARMD age related macular degeneration; POAG primary open angle glaucoma; EBMD epithelial/anterior basement membrane dystrophy; ACIOL anterior chamber intraocular lens; IOL intraocular lens; PCIOL posterior chamber intraocular lens; Phaco/IOL phacoemulsification with intraocular lens placement; PRK photorefractive keratectomy; LASIK laser assisted in situ keratomileusis; HTN hypertension; DM diabetes mellitus; COPD chronic obstructive pulmonary disease

## 2019-10-31 ENCOUNTER — Ambulatory Visit (INDEPENDENT_AMBULATORY_CARE_PROVIDER_SITE_OTHER): Payer: Medicare Other | Admitting: *Deleted

## 2019-10-31 DIAGNOSIS — I509 Heart failure, unspecified: Secondary | ICD-10-CM

## 2019-10-31 DIAGNOSIS — I480 Paroxysmal atrial fibrillation: Secondary | ICD-10-CM

## 2019-11-01 ENCOUNTER — Ambulatory Visit (INDEPENDENT_AMBULATORY_CARE_PROVIDER_SITE_OTHER): Payer: Medicare Other | Admitting: Ophthalmology

## 2019-11-01 ENCOUNTER — Encounter (INDEPENDENT_AMBULATORY_CARE_PROVIDER_SITE_OTHER): Payer: Self-pay | Admitting: Ophthalmology

## 2019-11-01 DIAGNOSIS — H4311 Vitreous hemorrhage, right eye: Secondary | ICD-10-CM

## 2019-11-01 DIAGNOSIS — Z961 Presence of intraocular lens: Secondary | ICD-10-CM

## 2019-11-01 DIAGNOSIS — I10 Essential (primary) hypertension: Secondary | ICD-10-CM

## 2019-11-01 DIAGNOSIS — H3581 Retinal edema: Secondary | ICD-10-CM | POA: Diagnosis not present

## 2019-11-01 DIAGNOSIS — H2101 Hyphema, right eye: Secondary | ICD-10-CM | POA: Diagnosis not present

## 2019-11-01 DIAGNOSIS — H35033 Hypertensive retinopathy, bilateral: Secondary | ICD-10-CM

## 2019-11-01 LAB — CUP PACEART REMOTE DEVICE CHECK
Battery Remaining Longevity: 74 mo
Battery Voltage: 2.98 V
Brady Statistic AP VP Percent: 93.26 %
Brady Statistic AP VS Percent: 0.2 %
Brady Statistic AS VP Percent: 6.48 %
Brady Statistic AS VS Percent: 0.06 %
Brady Statistic RA Percent Paced: 92.72 %
Brady Statistic RV Percent Paced: 99.03 %
Date Time Interrogation Session: 20201109163845
HighPow Impedance: 64 Ohm
Implantable Lead Implant Date: 20130828
Implantable Lead Implant Date: 20130828
Implantable Lead Implant Date: 20130828
Implantable Lead Location: 753858
Implantable Lead Location: 753859
Implantable Lead Location: 753860
Implantable Lead Model: 4194
Implantable Lead Model: 5076
Implantable Lead Model: 6935
Implantable Pulse Generator Implant Date: 20190918
Lead Channel Impedance Value: 247 Ohm
Lead Channel Impedance Value: 247 Ohm
Lead Channel Impedance Value: 285 Ohm
Lead Channel Impedance Value: 418 Ohm
Lead Channel Impedance Value: 513 Ohm
Lead Channel Impedance Value: 646 Ohm
Lead Channel Pacing Threshold Amplitude: 0.75 V
Lead Channel Pacing Threshold Amplitude: 0.75 V
Lead Channel Pacing Threshold Amplitude: 0.875 V
Lead Channel Pacing Threshold Pulse Width: 0.4 ms
Lead Channel Pacing Threshold Pulse Width: 0.4 ms
Lead Channel Pacing Threshold Pulse Width: 0.4 ms
Lead Channel Sensing Intrinsic Amplitude: 0.75 mV
Lead Channel Sensing Intrinsic Amplitude: 0.75 mV
Lead Channel Sensing Intrinsic Amplitude: 2.875 mV
Lead Channel Sensing Intrinsic Amplitude: 2.875 mV
Lead Channel Setting Pacing Amplitude: 1.5 V
Lead Channel Setting Pacing Amplitude: 1.5 V
Lead Channel Setting Pacing Amplitude: 2.5 V
Lead Channel Setting Pacing Pulse Width: 0.4 ms
Lead Channel Setting Pacing Pulse Width: 0.4 ms
Lead Channel Setting Sensing Sensitivity: 0.3 mV

## 2019-11-01 MED ORDER — PREDNISOLONE ACETATE 1 % OP SUSP: 1.0000 [drp] | Freq: Four times a day (QID) | OPHTHALMIC | 1 refills | Status: DC

## 2019-11-08 ENCOUNTER — Encounter: Payer: Self-pay | Admitting: Cardiovascular Disease

## 2019-11-08 ENCOUNTER — Ambulatory Visit (INDEPENDENT_AMBULATORY_CARE_PROVIDER_SITE_OTHER): Payer: Medicare Other | Admitting: Cardiovascular Disease

## 2019-11-08 ENCOUNTER — Other Ambulatory Visit: Payer: Self-pay

## 2019-11-08 VITALS — BP 136/70 | HR 79 | Temp 97.3°F | Ht 73.0 in | Wt 176.0 lb

## 2019-11-08 DIAGNOSIS — Z953 Presence of xenogenic heart valve: Secondary | ICD-10-CM

## 2019-11-08 DIAGNOSIS — I5042 Chronic combined systolic (congestive) and diastolic (congestive) heart failure: Secondary | ICD-10-CM

## 2019-11-08 DIAGNOSIS — I712 Thoracic aortic aneurysm, without rupture: Secondary | ICD-10-CM

## 2019-11-08 DIAGNOSIS — I671 Cerebral aneurysm, nonruptured: Secondary | ICD-10-CM

## 2019-11-08 DIAGNOSIS — E039 Hypothyroidism, unspecified: Secondary | ICD-10-CM

## 2019-11-08 DIAGNOSIS — D500 Iron deficiency anemia secondary to blood loss (chronic): Secondary | ICD-10-CM

## 2019-11-08 DIAGNOSIS — M1A09X0 Idiopathic chronic gout, multiple sites, without tophus (tophi): Secondary | ICD-10-CM

## 2019-11-08 DIAGNOSIS — Z79899 Other long term (current) drug therapy: Secondary | ICD-10-CM

## 2019-11-08 DIAGNOSIS — I25810 Atherosclerosis of coronary artery bypass graft(s) without angina pectoris: Secondary | ICD-10-CM | POA: Diagnosis not present

## 2019-11-08 DIAGNOSIS — Z95810 Presence of automatic (implantable) cardiac defibrillator: Secondary | ICD-10-CM

## 2019-11-08 DIAGNOSIS — I480 Paroxysmal atrial fibrillation: Secondary | ICD-10-CM | POA: Diagnosis not present

## 2019-11-08 DIAGNOSIS — E7800 Pure hypercholesterolemia, unspecified: Secondary | ICD-10-CM

## 2019-11-08 DIAGNOSIS — I10 Essential (primary) hypertension: Secondary | ICD-10-CM

## 2019-11-08 DIAGNOSIS — Z9889 Other specified postprocedural states: Secondary | ICD-10-CM

## 2019-11-08 DIAGNOSIS — N1831 Chronic kidney disease, stage 3a: Secondary | ICD-10-CM

## 2019-11-08 DIAGNOSIS — I7121 Aneurysm of the ascending aorta, without rupture: Secondary | ICD-10-CM

## 2019-11-08 DIAGNOSIS — Z5181 Encounter for therapeutic drug level monitoring: Secondary | ICD-10-CM

## 2019-11-08 MED ORDER — ASPIRIN EC 81 MG PO TBEC: 81.0000 mg | DELAYED_RELEASE_TABLET | Freq: Every day | ORAL | 3 refills | Status: DC

## 2019-11-08 NOTE — Progress Notes (Signed)
Cardiology Office Note:    Date:  11/10/2019   ID:  [name], DOB 1941-11-25, MRN [id number]  PCP:  Patient, No Pcp Per  Cardiologist:  Sanda Klein, MD  Electrophysiologist:  None   Referring MD: No ref. provider found   chief complaint: Dyspnea  History of Present Illness:    [name] is a 78 y.o. male with a hx of coronary artery disease with redo bypass surgery 2004, aortic valve replacement with biological prosthesis, severe ischemic cardiomyopathy with combined systolic and diastolic heart failure, CRT-D, bilateral carotid stenosis with bilateral endarterectomy in the remote past.  On several occasions he has had heart failure exacerbation related to persistent atrial fibrillation.  It generally occurs after he has been in the arrhythmia for at least a couple of weeks and is partly related to loss of CRT pacing.  We have performed successful cardioversion, each time with clinical improvement, but anticoagulation  had to be interrupted once because of severe GI bleeding and once because of intraocular bleeding (most recently).  He has been doing quite well from a cardiovascular standpoint since his last cardioversion, but developed transient loss of vision in his right eye due to hyphema.  This has now resolved completely, roughly 4 weeks after we stopped his anticoagulant.  He continues to take amiodarone.  Device interrogation shows that he has maintained normal rhythm without interruption since the cardioversion on October 9.  OptiVol is in normal range.  He has nearly 100% biventricular pacing.  He has not had catheter evidence of bleeding: No GI bleeding, no falls, no serious injuries.  His most recent echocardiogram in March 2019 showed left ventricular ejection fraction of 30-35% with wall motion abnormalities in the inferior-inferolateral and anteroapical distribution.  Aortic prosthesis gradients were normal and there was no significant mitral  regurgitation.  The Doppler parameters were consistent with high filling pressures.  The patient does not have symptoms concerning for COVID-19 infection (fever, chills, cough, or Unexplained shortness of breath).   Past Medical History:  Diagnosis Date   Anginal pain ([place])    Anxiety    Aortic stenosis    a. s/p bioprosthetic AVR in 2010   Arthritis    gout   CAD (coronary artery disease)    a. s/p CABG in 1992 b. redo CABG in 2004 c. cath in 2010 showing patent LIMA-LAD, free RIMA-OM, and SVG-OM, and 80% stenosis of small PLA branch   Chest pain    CHF (congestive heart failure) (HCC)    Dysrhythmia    GERD (gastroesophageal reflux disease)    with gas and bloating probably cause of chest pain   H/O atrial flutter    a. s/p ablation and not on anticoagulation secondary to GIB and alcohol abuse.    H/O: gout    Hyperlipidemia    Hypertension    Hypertensive retinopathy    OU   ICD (implantable cardiac defibrillator) infection, s/p removal of ICD 07/12/2012   re-implanted Medtronic BiV ICD, serial number [id number]    Ischemic cardiomyopathy    a. EF previously 20-25%, improved to 40-45% by echo in 03/2013 b. 10/2017: echo showing of 30-35% with Grade 2 DD, normal function of AVR, moderate MR.    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   Shortness of breath     Past Surgical History:  Procedure Laterality Date   BI-VENTRICULAR IMPLANTABLE CARDIOVERTER DEFIBRILLATOR Right 08/18/2012  Medtronic BiV ICD, serial number [id number] ; Procedure: BI-VENTRICULAR IMPLANTABLE CARDIOVERTER DEFIBRILLATOR  (CRT-D);  Surgeon: Marinus Maw, MD;  Location: [facility] CATH LAB;  Service: Cardiovascular;  Laterality: Right;   BIV ICD GENERATOR CHANGEOUT N/A 09/08/2018   Procedure: BIV ICD GENERATOR CHANGEOUT;  Surgeon: Thurmon Fair, MD;  Location: [facility] INVASIVE CV LAB;  Service: Cardiovascular;  Laterality: N/A;     CARDIAC CATHETERIZATION  Dec 18, 2003   with a presentation of atrial flutter with rapid ventricular response. He had a cath and two stents to his PDA after his SVG to his RCA. His other graft were patent. He had LV with EF of 30%.    CARDIOVERSION N/A 05/30/2019   Procedure: CARDIOVERSION;  Surgeon: Thurmon Fair, MD;  Location: [facility] ENDOSCOPY;  Service: Cardiovascular;  Laterality: N/A;   CARDIOVERSION N/A 09/30/2019   Procedure: CARDIOVERSION;  Surgeon: Thurmon Fair, MD;  Location: [facility] ENDOSCOPY;  Service: Cardiovascular;  Laterality: N/A;   CAROTID ENDARTERECTOMY  11/2002   left in December 2003 and known renal artery stenosis of 60-70%   CATARACT EXTRACTION Bilateral    CORONARY ARTERY BYPASS GRAFT  1993   redo in 2000, he has had multiple MI previous to both procedures.   EYE SURGERY Bilateral    Cat Sx OU   IMPLANTABLE CARDIOVERTER DEFIBRILLATOR (ICD) GENERATOR CHANGE N/A 06/25/2012   Procedure: ICD GENERATOR CHANGE;  Surgeon: Marinus Maw, MD;  Location: [facility] CATH LAB;  Service: Cardiovascular;  Laterality: N/A;   VALVE REPLACEMENT  2010    Current Medications: Current Meds  Medication Sig   amiodarone (PACERONE) 200 MG tablet Take 1 tablet (200 mg total) by mouth daily.   atorvastatin (LIPITOR) 10 MG tablet TAKE 1 TABLET BY MOUTH EVERY DAY   atropine 1 % ophthalmic solution Place 1 drop into the right eye 2 (two) times daily.   brimonidine (ALPHAGAN) 0.2 % ophthalmic solution Place 1 drop into the right eye 2 (two) times daily.   carvedilol (COREG) 6.25 MG tablet Take 1 tablet (6.25 mg total) by mouth 2 (two) times daily with a meal.   colchicine 0.6 MG tablet Take 1 tablet (0.6 mg total) by mouth as needed (gout attacks).   dorzolamide-timolol (COSOPT) 22.3-6.8 MG/ML ophthalmic solution Place 1 drop into the right eye 2 (two) times daily.   ferrous sulfate 325 (65 FE) MG EC tablet Take 1 tablet (325 mg total) by mouth 2 (two) times daily.   folic acid  (FOLVITE) 1 MG tablet Take 1 tablet (1 mg total) by mouth daily.   furosemide (LASIX) 40 MG tablet Take 1 tablet (40 mg total) by mouth 2 (two) times daily.   Menthol, Topical Analgesic, (BENGAY EX) Apply 1 application topically daily as needed (pain).   nitroGLYCERIN (NITROSTAT) 0.4 MG SL tablet Place 1 tablet (0.4 mg) under the tongue every 5 minutes as needed for chest pain for up to 3 doses/call 9-1-1 and MD if no relief   potassium chloride SA (KLOR-CON) 20 MEQ tablet Take 1 tablet (20 mEq total) by mouth daily.   prednisoLONE acetate (PRED FORTE) 1 % ophthalmic suspension Place 1 drop into the right eye 4 (four) times daily.   Current Facility-Administered Medications for the 11/08/19 encounter (Office Visit) with Kaliopi Blyden, Rachelle Hora, MD  Medication   sodium chloride flush (NS) 0.9 % injection 3 mL     Allergies:   Atorvastatin and Other   Social History   Socioeconomic History   Marital status: Married    Spouse name: Not  on file   Number of children: Not on file   Years of education: Not on file   Highest education level: Not on file  Occupational History   Not on file  Social Needs   Financial resource strain: Not on file   Food insecurity    Worry: Not on file    Inability: Not on file   Transportation needs    Medical: Not on file    Non-medical: Not on file  Tobacco Use   Smoking status: Never Smoker   Smokeless tobacco: Current User    Types: Chew  Substance and Sexual Activity   Alcohol use: Yes    Alcohol/week: 14.0 standard drinks    Types: 14 Cans of beer per week   Drug use: No   Sexual activity: Not on file  Lifestyle   Physical activity    Days per week: Not on file    Minutes per session: Not on file   Stress: Not on file  Relationships   Social connections    Talks on phone: Not on file    Gets together: Not on file    Attends religious service: Not on file    Active member of club or organization: Not on file    Attends  meetings of clubs or organizations: Not on file    Relationship status: Not on file  Other Topics Concern   Not on file  Social History Narrative   Not on file     Family History: The patient's family history includes CAD in his brother.  ROS:   Please see the history of present illness.   All other systems are reviewed and are negative.   EKGs/Labs/Other Studies Reviewed:    The following studies were reviewed today: Comprehensive pacemaker download today  EKG:  EKG is ordered today.  It shows atrial paced, biventricular paced rhythm  Recent Labs: 01/20/2019: Magnesium 1.8 09/26/2019: ALT 8; TSH 5.250 10/12/2019: BUN 23; Creatinine, Ser 1.20; Hemoglobin 8.6; Platelets 210; Potassium 3.4; Sodium 135  Recent Lipid Panel    Component Value Date/Time   CHOL 87 (L) 05/05/2019 1402   TRIG 44 05/05/2019 1402   HDL 46 05/05/2019 1402   CHOLHDL 1.9 05/05/2019 1402   CHOLHDL 4.2 12/19/2016 0841   VLDL 15 12/19/2016 0841   LDLCALC 32 05/05/2019 1402    Physical Exam:    VS:  BP 136/70 (BP Location: Left Arm, Patient Position: Sitting, Cuff Size: Normal)    Pulse 79    Temp (!) 97.3 F (36.3 C)    Ht  (1.854 m)    Wt 176 lb (79.8 kg)    BMI 23.22 kg/m     Wt Readings from Last 3 Encounters:  11/08/19 176 lb (79.8 kg)  10/12/19 174 lb (78.9 kg)  09/30/19 170 lb (77.1 kg)     General: Alert, oriented x3, no distress, appears well Head: no evidence of trauma, PERRL, EOMI, no exophtalmos or lid lag, no myxedema, no xanthelasma; normal ears, nose and oropharynx.  On simple nonmagnified exam I do not see any evidence of residual hyphema Neck: 3-4 cm jugular venous pulsations and no hepatojugular reflux; brisk carotid pulses without delay and no carotid bruits Chest: clear to auscultation, no signs of consolidation by percussion or palpation, normal fremitus, symmetrical and full respiratory excursions Cardiovascular: normal position and quality of the apical impulse, regular  rhythm, normal first and second heart sounds, 2/6 early peaking systolic ejection murmur in the aortic focus radiating  towards both carotids no diastolic murmurs, rubs or gallops Abdomen: no tenderness or distention, no masses by palpation, no abnormal pulsatility or arterial bruits, normal bowel sounds, no hepatosplenomegaly Extremities: no clubbing, cyanosis or edema; 2+ radial, ulnar and brachial pulses bilaterally; 2+ right femoral, posterior tibial and dorsalis pedis pulses; 2+ left femoral, posterior tibial and dorsalis pedis pulses; no subclavian or femoral bruits Neurological: grossly nonfocal Psych: Normal mood and affect   ASSESSMENT:    1. COMBINED HEART FAILURE, CHRONIC   2. PAF (paroxysmal atrial fibrillation) (HCC)   3. Coronary artery disease involving coronary bypass graft of native heart without angina pectoris   4. 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 Hypercholesterolemia   6. Essential hypertension   7. History of bilateral carotid endarterectomy   8. History of aortic valve replacement with bioprosthetic valve   9. Ascending aortic aneurysm (HCC)   10. Stage 3a chronic kidney disease   11. Chronic gout of multiple sites, unspecified cause   12. Iron deficiency anemia due to chronic blood loss   13. Acquired hypothyroidism   14. 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 CHF: NYHA functional class II.  As before, he improves once he is back in normal rhythm.  Appears euvolemic clinically and by OptiVol.  Most recent EF 30-35%.  Taking carvedilol, but he has had issues with hypotension every time that we tried ARB or ACE inhibitors in the past.   2. AFib:  Unfortunately, despite high embolic risk, he is unable to take daily anticoagulants due to serious bleeding complications (GI bleeding requiring transfusions; intraocular  bleeding).  This makes it more difficult to promptly perform cardioversion when he has recurrent arrhythmia.  We will keep him on amiodarone indefinitely. 3. CAD s/p redo CABG:  Asymptomatic.  He is taking statin.  Will restart aspirin since anticoagulation was stopped. (cath in 2010 showing patent LIMA-LAD, free RIMA-OM, and SVG-OM, and 80% stenosis of small PLA branch). 4. CRT-D:  Excellent biventricular pacing.  OptiVol at baseline. 5. HLP:  Most recent LDL cholesterol 32 on current statin dose.  HDL is not bad at 46. 6. HTN:  Adequate control 7. Carotid diseases/p bilateral carotid endarterectomies.  No focal neurological events. 8. S/P AVR:March 2019 echo shows normal bioprosthetic valve function. 9. Ascending aortic aneurysm:Stable size. 5.0 cm by CT angiogram of the chest in June 2018, 5.1 cm in March 2019. He is not a candidate for 3rd thoracotomy therefore routine monitoring does not appear to be justified. 10. CKD 3: Most recent creatinine 1.2, the best he has had in a while. 11. Gout:  No recent gout attacks 12. Fe def anemia:Iron studies were still not fully replenished by labs from early October.  Moderately anemic with hemoglobin 8.6.  Suspect this will improve now that we are discontinuing his anticoagulants again. 13. Hypothyroidism:Borderline elevated TSH level.  Might be amiodarone related.  We will recheck TSH and free T4 in a few months.   14. Amiodarone:  needs periodic LFTs and TFTs while receiving amiodarone. 15. Anterior communicating artery aneurysm: 4.115mm aneurysm discovered incidentally at the time of preoperative carotid angiography. Asymptomatic.  We will bring him back for elective direct-current cardioversion after 3 weeks of oral anticoagulation. This procedure has been fully reviewed with the patient and written informed consent has been obtained.   Medication Adjustments/Labs and Tests Ordered: Current medicines are  reviewed at length with the patient  today.  Concerns regarding medicines are outlined above.  Orders Placed This Encounter  Procedures   EKG 12-Lead   Meds ordered this encounter  Medications   aspirin EC 81 MG tablet    Sig: Take 1 tablet (81 mg total) by mouth daily.    Dispense:  90 tablet    Refill:  3    Patient Instructions  Medication Instructions:  START Aspirin 81 mg once daily  *If you need a refill on your cardiac medications before your next appointment, please call your pharmacy*  Lab Work: None ordered If you have labs (blood work) drawn today and your tests are completely normal, you will receive your results only by:  MyChart Message (if you have MyChart) OR  A paper copy in the mail If you have any lab test that is abnormal or we need to change your treatment, we will call you to review the results.  Testing/Procedures: None ordered  Follow-Up: At [facility], you and your health needs are our priority.  As part of our continuing mission to provide you with exceptional heart care, we have created designated Provider Care Teams.  These Care Teams include your primary Cardiologist (physician) and Advanced Practice Providers (APPs -  Physician Assistants and Nurse Practitioners) who all work together to provide you with the care you need, when you need it.  Your next appointment:   3 month(s)  The format for your next appointment:   In Person  Provider:   Thurmon Fair, MD     Signed, Thurmon Fair, MD  11/10/2019 2:24 PM    [facility]

## 2019-11-08 NOTE — Patient Instructions (Signed)
Medication Instructions:  START Aspirin 81 mg once daily  *If you need a refill on your cardiac medications before your next appointment, please call your pharmacy*  Lab Work: None ordered If you have labs (blood work) drawn today and your tests are completely normal, you will receive your results only by: [name] MyChart Message (if you have MyChart) OR . A paper copy in the mail If you have any lab test that is abnormal or we need to change your treatment, we will call you to review the results.  Testing/Procedures: None ordered  Follow-Up: At [facility], you and your health needs are our priority.  As part of our continuing mission to provide you with exceptional heart care, we have created designated Provider Care Teams.  These Care Teams include your primary Cardiologist (physician) and Advanced Practice Providers (APPs -  Physician Assistants and Nurse Practitioners) who all work together to provide you with the care you need, when you need it.  Your next appointment:   3 month(s)  The format for your next appointment:   In Person  Provider:   Sanda Klein, MD

## 2019-11-09 NOTE — Progress Notes (Signed)
[facility] - Clinic Note  11/11/2019     CHIEF COMPLAINT Patient presents for Retina Follow Up   HISTORY OF PRESENT ILLNESS: [name] is a 78 y.o. male who presents to the clinic today for:   HPI    Retina Follow Up    Patient presents with  Other.  In right eye.  This started weeks ago.  Severity is moderate.  Duration of weeks.  Since onset it is gradually improving.  I, the attending physician,  performed the HPI with the patient and updated documentation appropriately.          Comments    Pt states his vision is improving OD.  Patient denies eye pain or discomfort and denies any new or worsening floaters or fol OU.       Last edited by Rennis Chris, MD on 11/11/2019  8:54 AM. (History)    pt states his vision is getting better and he is using the drops as directed  Referring physician: No referring provider defined for this encounter.  HISTORICAL INFORMATION: vitre  Selected notes from the MEDICAL RECORD NUMBER    CURRENT MEDICATIONS: Current Outpatient Medications (Ophthalmic Drugs)  Medication Sig  . atropine 1 % ophthalmic solution Place 1 drop into the right eye 2 (two) times daily.  . brimonidine (ALPHAGAN) 0.2 % ophthalmic solution Place 1 drop into the right eye 2 (two) times daily.  . dorzolamide-timolol (COSOPT) 22.3-6.8 MG/ML ophthalmic solution Place 1 drop into the right eye 2 (two) times daily.  . prednisoLONE acetate (PRED FORTE) 1 % ophthalmic suspension Place 1 drop into the right eye 4 (four) times daily.   No current facility-administered medications for this visit.  (Ophthalmic Drugs)   Current Outpatient Medications (Other)  Medication Sig  . amiodarone (PACERONE) 200 MG tablet Take 1 tablet (200 mg total) by mouth daily.  [name] aspirin EC 81 MG tablet Take 1 tablet (81 mg total) by mouth daily.  [name] atorvastatin (LIPITOR) 10 MG tablet TAKE 1 TABLET BY MOUTH EVERY DAY  . carvedilol (COREG) 6.25 MG tablet Take 1 tablet  (6.25 mg total) by mouth 2 (two) times daily with a meal.  . colchicine 0.6 MG tablet Take 1 tablet (0.6 mg total) by mouth as needed (gout attacks).  . ferrous sulfate 325 (65 FE) MG EC tablet Take 1 tablet (325 mg total) by mouth 2 (two) times daily.  . folic acid (FOLVITE) 1 MG tablet Take 1 tablet (1 mg total) by mouth daily.  . furosemide (LASIX) 40 MG tablet Take 1 tablet (40 mg total) by mouth 2 (two) times daily.  . Menthol, Topical Analgesic, (BENGAY EX) Apply 1 application topically daily as needed (pain).  . nitroGLYCERIN (NITROSTAT) 0.4 MG SL tablet Place 1 tablet (0.4 mg) under the tongue every 5 minutes as needed for chest pain for up to 3 doses/call 9-1-1 and MD if no relief  . potassium chloride SA (KLOR-CON) 20 MEQ tablet Take 1 tablet (20 mEq total) by mouth daily.  . predniSONE (DELTASONE) 10 MG tablet Take 20 mg (2 tablets) per day for 3 days and then take 10 mg (1 tablet) per day for 3 days.   Current Facility-Administered Medications (Other)  Medication Route  . sodium chloride flush (NS) 0.9 % injection 3 mL Intravenous      REVIEW OF SYSTEMS: ROS    Positive for: Genitourinary, Cardiovascular, Eyes   Negative for: Constitutional, Gastrointestinal, Neurological, Skin, Musculoskeletal, HENT, Endocrine, Respiratory, Psychiatric,  Allergic/Imm, Heme/Lymph   Last edited by Corrinne EagleEnglish, Ashley L on 11/11/2019  8:45 AM. (History)       ALLERGIES Allergies  Allergen Reactions  . Atorvastatin Other (See Comments)    Joint pain (patient takes this, however)  . Other Other (See Comments)    Patient is sodium-restricted, per his admission    PAST MEDICAL HISTORY Past Medical History:  Diagnosis Date  . Anginal pain (HCC)   . Anxiety   . Aortic stenosis    a. s/p bioprosthetic AVR in 2010  . Arthritis    gout  . CAD (coronary artery disease)    a. s/p CABG in 1992 b. redo CABG in 2004 c. cath in 2010 showing patent LIMA-LAD, free RIMA-OM, and SVG-OM, and 80%  stenosis of small PLA branch  . Chest pain   . CHF (congestive heart failure) (HCC)   . Dysrhythmia   . GERD (gastroesophageal reflux disease)    with gas and bloating probably cause of chest pain  . H/O atrial flutter    a. s/p ablation and not on anticoagulation secondary to GIB and alcohol abuse.   . H/O: gout   . Hyperlipidemia   . Hypertension   . Hypertensive retinopathy    OU  . ICD (implantable cardiac defibrillator) infection, s/p removal of ICD 07/12/2012   re-implanted Medtronic BiV ICD, serial number [id number]   . Ischemic cardiomyopathy    a. EF previously 20-25%, improved to 40-45% by echo in 03/2013 b. 10/2017: echo showing of 30-35% with Grade 2 DD, normal function of AVR, moderate MR.   . Myocardial infarction (HCC)   . 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 Shortness of breath    Past Surgical History:  Procedure Laterality Date  . BI-VENTRICULAR IMPLANTABLE CARDIOVERTER DEFIBRILLATOR Right 08/18/2012   Medtronic BiV ICD, serial number [id number] ; Procedure: BI-VENTRICULAR IMPLANTABLE CARDIOVERTER DEFIBRILLATOR  (CRT-D);  Surgeon: Marinus MawGregg W Taylor, MD;  [facility];  Service: Cardiovascular;  Laterality: Right;  . BIV ICD GENERATOR CHANGEOUT N/A 09/08/2018   Procedure: BIV ICD GENERATOR CHANGEOUT;  Surgeon: Thurmon Fairroitoru, Mihai, MD;  Location: [facility] INVASIVE CV LAB;  Service: Cardiovascular;  Laterality: N/A;  . CARDIAC CATHETERIZATION  Dec 18, 2003   with a presentation of atrial flutter with rapid ventricular response. He had a cath and two stents to his PDA after his SVG to his RCA. His other graft were patent. He had LV with EF of 30%.   . CARDIOVERSION N/A 05/30/2019   Procedure: CARDIOVERSION;  Surgeon: Thurmon Fairroitoru, Mihai, MD;  Location: [facility] ENDOSCOPY;  Service: Cardiovascular;  Laterality: N/A;  . CARDIOVERSION N/A 09/30/2019   Procedure: CARDIOVERSION;  Surgeon: Thurmon Fairroitoru, Mihai, MD;  Location: [facility] ENDOSCOPY;  Service:  Cardiovascular;  Laterality: N/A;  . CAROTID ENDARTERECTOMY  11/2002   left in December 2003 and known renal artery stenosis of 60-70%  . CATARACT EXTRACTION Bilateral   . CORONARY ARTERY BYPASS GRAFT  1993   redo in 2000, he has had multiple MI previous to both procedures.  [name]. EYE SURGERY Bilateral    Cat Sx OU  . IMPLANTABLE CARDIOVERTER DEFIBRILLATOR (ICD) GENERATOR CHANGE N/A 06/25/2012   Procedure: ICD GENERATOR CHANGE;  Surgeon: Marinus MawGregg W Taylor, MD;  [facility];  Service: Cardiovascular;  Laterality: N/A;  . VALVE REPLACEMENT  2010    FAMILY HISTORY Family History  Problem Relation Age of Onset  . CAD Brother     SOCIAL HISTORY Social  History   Tobacco Use  . Smoking status: Never Smoker  . Smokeless tobacco: Current User    Types: Chew  Substance Use Topics  . Alcohol use: Yes    Alcohol/week: 14.0 standard drinks    Types: 14 Cans of beer per week  . Drug use: No         OPHTHALMIC EXAM:  Base Eye Exam    Visual Acuity (Snellen - Linear)      Right Left   Dist [place] 20/30 -2 20/25 -1   Dist ph [place] 20/25 NI       Tonometry (Tonopen, 8:48 AM)      Right Left   Pressure 15 15       Pupils      Dark Light Shape React APD   Right 5 4 Round Brisk 0   Left 1 1 Round Minimal 0       Visual Fields      Left Right    Full Full       Extraocular Movement      Right Left    Full Full       Neuro/Psych    Oriented x3: Yes   Mood/Affect: Normal       Dilation    Both eyes: 1.0% Mydriacyl, 2.5% Phenylephrine @ 8:48 AM        Slit Lamp and Fundus Exam    Slit Lamp Exam      Right Left   Lids/Lashes Dermatochalasis - upper lid, Meibomian gland dysfunction Dermatochalasis - upper lid, Meibomian gland dysfunction   Conjunctiva/Sclera Mild, temporal Pinguecula, white and quiet Mild, temporal Pinguecula, white and quiet   Cornea 2-3+ Punctate epithelial erosions, Debris in tear film 2+ Punctate epithelial erosions, tear film debris   Anterior  Chamber Deep, 1-2+pigment, layered hyphema stably resolved -- blood over pupil and lens resolved Deep and quiet   Iris Round and very poorly dilated to 16mm, slightly irregular, vertical, linear TID at 0300 with +heme Round and poorly dilated to 65mm   Lens Posterior chamber intraocular lens, pigment deposition on anterior capsule Posterior chamber intraocular lens   Vitreous Mild Vitreous syneresis; no heme Mild Vitreous syneresis       Fundus Exam      Right Left   Disc pink, sharp Pink and Sharp   C/D Ratio 0.2 0.2   Macula flat;  Flat, Good foveal reflex, No heme or edema   Vessels mild attenuation milid Vascular attenuation   Periphery attached; no heme Attached             IMAGING AND PROCEDURES  Imaging and Procedures for @TODAY @  OCT, Retina - OU - Both Eyes       Right Eye Quality was good. Central Foveal Thickness: 288. Progression has improved. Findings include no IRF, normal foveal contour, no SRF, vitreomacular adhesion  (Partial PVD).   Left Eye Quality was good. Central Foveal Thickness: 263. Progression has been stable. Findings include normal foveal contour, no IRF, no SRF, vitreomacular adhesion .   Notes *Images captured and stored on drive  Diagnosis / Impression:  OD: NFP; no IRF/SRF; partial PVD OS: NFP, no IRF/SRF; +VMA  Clinical management:  See below  Abbreviations: NFP - Normal foveal profile. CME - cystoid macular edema. PED - pigment epithelial detachment. IRF - intraretinal fluid. SRF - subretinal fluid. EZ - ellipsoid zone. ERM - epiretinal membrane. ORA - outer retinal atrophy. ORT - outer retinal tubulation. SRHM - subretinal hyper-reflective material  ASSESSMENT/PLAN:    ICD-10-CM   1. Hyphema, right eye  H21.01   2. Vitreous hemorrhage, right eye (HCC)  H43.11   3. Uveitis-hyphema-glaucoma syndrome of right eye  T85.398A    H20.9    H40.41X0   4. Retinal edema  H35.81 OCT, Retina - OU - Both Eyes  5. Essential  hypertension  I10   6. Hypertensive retinopathy of both eyes  H35.033   7. Pseudophakia of both eyes  Z96.1     1-3. Vitreous hemorrhage w/ layered hyphema OD -- resolved        UGH syndrome OD  - pt with significant cardiac history and recent cardioversion on Eliquis -- pt has stopped Eliquis as of 10.21.20 -- cleared with cardiologist  - initially presented with 2 wk history of decreased vision OD -- was intermittent initially, but 2 days prior to presentation (~10.14.20), became stably poor without improvement  - initial exam showed poor dilation, layered hyphema and vitreous hemorrhage OD;   - large nasal TID w/ +heme within and mild pseudophakodonesis -- UGH syndrome more apparent and likely etiology of   - s/p therapeutic AC tap (10.22.20 and 10.29.20) to remove some heme and lower IOP  - today, layered hyphema stably resolved -- now just with 1-2+pigment in AC  - IOP okay at 15 mmHg OD  - VH cleared -- posterior pole without significant pathology  - OCT essentially normal OD  - prior b-scans showed +vit opacities/heme, no obvious RD or mass -- majority of vit opacities were anterior close to IOL  - pt denies trauma or fall; suspect VH related to Eliquis use + UGH syndrome  - VH/hyphema precautions reviewed -- minimize activities, keep head elevated, avoid ASA/NSAIDs/blood thinners as able  - cont  atropine OD BID -- decrease to QD   Pred Forte OD QID -- start PF taper -- 4,3,2,1 drops daily, decrease weekly   Cosopt BID OD   Brimonidine BID OD -- okay to stop  - f/u 3 wks  4. No retinal edema on exam or OCT  5,6. Hypertensive retinopathy OU  - discussed importance of tight BP control  - monitor  7. Pseudophakia OU  - s/p CE/IOL OU  - monitor   Ophthalmic Meds Ordered this visit:  No orders of the defined types were placed in this encounter.      Return in about 3 weeks (around 12/02/2019) for f/u VH OD, DFE, OCT.  There are no Patient Instructions on file for this  visit.   Explained the diagnoses, plan, and follow up with the patient and they expressed understanding.  Patient expressed understanding of the importance of proper follow up care.   This document serves as a record of services personally performed by Karie Chimera, MD, PhD. It was created on their behalf by Laurian Brim, OA, an ophthalmic assistant. The creation of this record is the provider's dictation and/or activities during the visit.    Electronically signed by: Laurian Brim, OA 11.18.2020 4:47 PM  Karie Chimera, M.D., Ph.D. Diseases & Surgery of the Retina and Vitreous [facility]  I have reviewed the above documentation for accuracy and completeness, and I agree with the above. Karie Chimera, M.D., Ph.D. 11/12/19 4:47 PM    Abbreviations: M myopia (nearsighted); A astigmatism; H hyperopia (farsighted); P presbyopia; Mrx spectacle prescription;  CTL contact lenses; OD right eye; OS left eye; OU both eyes  XT exotropia; ET esotropia; PEK punctate epithelial keratitis;  PEE punctate epithelial erosions; DES dry eye syndrome; MGD meibomian gland dysfunction; ATs artificial tears; PFAT's preservative free artificial tears; NSC nuclear sclerotic cataract; PSC posterior subcapsular cataract; ERM epi-retinal membrane; PVD posterior vitreous detachment; RD retinal detachment; DM diabetes mellitus; DR diabetic retinopathy; NPDR non-proliferative diabetic retinopathy; PDR proliferative diabetic retinopathy; CSME clinically significant macular edema; DME diabetic macular edema; dbh dot blot hemorrhages; CWS cotton wool spot; POAG primary open angle glaucoma; C/D cup-to-disc ratio; HVF humphrey visual field; GVF goldmann visual field; OCT optical coherence tomography; IOP intraocular pressure; BRVO Branch retinal vein occlusion; CRVO central retinal vein occlusion; CRAO central retinal artery occlusion; BRAO branch retinal artery occlusion; RT retinal tear; SB scleral  buckle; PPV pars plana vitrectomy; VH Vitreous hemorrhage; PRP panretinal laser photocoagulation; IVK intravitreal kenalog; VMT vitreomacular traction; MH Macular hole;  NVD neovascularization of the disc; NVE neovascularization elsewhere; AREDS age related eye disease study; ARMD age related macular degeneration; POAG primary open angle glaucoma; EBMD epithelial/anterior basement membrane dystrophy; ACIOL anterior chamber intraocular lens; IOL intraocular lens; PCIOL posterior chamber intraocular lens; Phaco/IOL phacoemulsification with intraocular lens placement; PRK photorefractive keratectomy; LASIK laser assisted in situ keratomileusis; HTN hypertension; DM diabetes mellitus; COPD chronic obstructive pulmonary disease

## 2019-11-10 ENCOUNTER — Other Ambulatory Visit: Payer: Self-pay

## 2019-11-10 ENCOUNTER — Telehealth: Payer: Self-pay | Admitting: Cardiovascular Disease

## 2019-11-10 MED ORDER — PREDNISONE 10 MG PO TABS: ORAL_TABLET | ORAL | 1 refills | Status: DC

## 2019-11-10 NOTE — Telephone Encounter (Signed)
New message   Daughter Eustaquio Maize calling to report joint pain. Would like to know to discuss steroid patch

## 2019-11-10 NOTE — Telephone Encounter (Signed)
Have him take prednisone 20 mg daily for 3 days, then 10 mg daily for 3 days, then stop (Rx prednisone 10 mg tabs, #9, Rf 1). Thanks

## 2019-11-10 NOTE — Progress Notes (Signed)
LMTCB with pts daughter Eustaquio Maize letting her know that a Rx was sent in for the patient.

## 2019-11-10 NOTE — Telephone Encounter (Signed)
Spoke with the patients daughter regarding the patients joint issues. She states the patient has ongoing joint pain. The patients daughter was on a Prednisone dose pack which helped with her similar joint pain. The patient is now wanting to know if Dr. Sallyanne Kuster can prescribe something Prednisone or something similar for him to help with his joint pain.   The patient does not have a PCP and does wish to establish care with a PCP at this time.   Will route to Dr. Sallyanne Kuster for advisement.

## 2019-11-11 ENCOUNTER — Ambulatory Visit (INDEPENDENT_AMBULATORY_CARE_PROVIDER_SITE_OTHER): Payer: Medicare Other | Admitting: Ophthalmology

## 2019-11-11 ENCOUNTER — Encounter (INDEPENDENT_AMBULATORY_CARE_PROVIDER_SITE_OTHER): Payer: Self-pay | Admitting: Ophthalmology

## 2019-11-11 DIAGNOSIS — H2101 Hyphema, right eye: Secondary | ICD-10-CM | POA: Diagnosis not present

## 2019-11-11 DIAGNOSIS — H3581 Retinal edema: Secondary | ICD-10-CM | POA: Diagnosis not present

## 2019-11-11 DIAGNOSIS — T85398A Other mechanical complication of other ocular prosthetic devices, implants and grafts, initial encounter: Secondary | ICD-10-CM

## 2019-11-11 DIAGNOSIS — Z961 Presence of intraocular lens: Secondary | ICD-10-CM

## 2019-11-11 DIAGNOSIS — I10 Essential (primary) hypertension: Secondary | ICD-10-CM | POA: Diagnosis not present

## 2019-11-11 DIAGNOSIS — H4311 Vitreous hemorrhage, right eye: Secondary | ICD-10-CM | POA: Diagnosis not present

## 2019-11-11 DIAGNOSIS — H35033 Hypertensive retinopathy, bilateral: Secondary | ICD-10-CM

## 2019-11-11 DIAGNOSIS — H209 Unspecified iridocyclitis: Secondary | ICD-10-CM

## 2019-11-11 DIAGNOSIS — H4041X0 Glaucoma secondary to eye inflammation, right eye, stage unspecified: Secondary | ICD-10-CM

## 2019-11-20 ENCOUNTER — Other Ambulatory Visit: Payer: Self-pay | Admitting: Cardiovascular Disease

## 2019-11-25 ENCOUNTER — Encounter (HOSPITAL_COMMUNITY): Payer: Self-pay | Admitting: Internal Medicine

## 2019-11-25 ENCOUNTER — Other Ambulatory Visit: Payer: Self-pay

## 2019-11-25 ENCOUNTER — Telehealth: Payer: Self-pay | Admitting: Cardiovascular Disease

## 2019-11-25 ENCOUNTER — Inpatient Hospital Stay (HOSPITAL_COMMUNITY)
Admission: EM | Admit: 2019-11-25 | Discharge: 2019-11-28 | DRG: 291 | Disposition: A | Discharge status: Home / Self Care | Payer: Medicare Other | Attending: Internal Medicine | Admitting: Internal Medicine

## 2019-11-25 ENCOUNTER — Emergency Department (HOSPITAL_COMMUNITY): Payer: Medicare Other

## 2019-11-25 ENCOUNTER — Telehealth: Payer: Self-pay | Admitting: Emergency Medicine

## 2019-11-25 DIAGNOSIS — I255 Ischemic cardiomyopathy: Secondary | ICD-10-CM

## 2019-11-25 DIAGNOSIS — I495 Sick sinus syndrome: Secondary | ICD-10-CM | POA: Diagnosis present

## 2019-11-25 DIAGNOSIS — N1832 Chronic kidney disease, stage 3b: Secondary | ICD-10-CM | POA: Diagnosis not present

## 2019-11-25 DIAGNOSIS — E875 Hyperkalemia: Secondary | ICD-10-CM | POA: Diagnosis not present

## 2019-11-25 DIAGNOSIS — I248 Other forms of acute ischemic heart disease: Secondary | ICD-10-CM | POA: Diagnosis present

## 2019-11-25 DIAGNOSIS — Z95810 Presence of automatic (implantable) cardiac defibrillator: Secondary | ICD-10-CM

## 2019-11-25 DIAGNOSIS — Z953 Presence of xenogenic heart valve: Secondary | ICD-10-CM | POA: Diagnosis not present

## 2019-11-25 DIAGNOSIS — Z951 Presence of aortocoronary bypass graft: Secondary | ICD-10-CM

## 2019-11-25 DIAGNOSIS — M109 Gout, unspecified: Secondary | ICD-10-CM | POA: Diagnosis present

## 2019-11-25 DIAGNOSIS — I739 Peripheral vascular disease, unspecified: Secondary | ICD-10-CM | POA: Diagnosis present

## 2019-11-25 DIAGNOSIS — I5043 Acute on chronic combined systolic (congestive) and diastolic (congestive) heart failure: Secondary | ICD-10-CM | POA: Diagnosis not present

## 2019-11-25 DIAGNOSIS — E785 Hyperlipidemia, unspecified: Secondary | ICD-10-CM | POA: Diagnosis present

## 2019-11-25 DIAGNOSIS — Z9842 Cataract extraction status, left eye: Secondary | ICD-10-CM | POA: Diagnosis not present

## 2019-11-25 DIAGNOSIS — I252 Old myocardial infarction: Secondary | ICD-10-CM

## 2019-11-25 DIAGNOSIS — R0902 Hypoxemia: Secondary | ICD-10-CM | POA: Diagnosis present

## 2019-11-25 DIAGNOSIS — I2510 Atherosclerotic heart disease of native coronary artery without angina pectoris: Secondary | ICD-10-CM | POA: Diagnosis present

## 2019-11-25 DIAGNOSIS — Z79899 Other long term (current) drug therapy: Secondary | ICD-10-CM

## 2019-11-25 DIAGNOSIS — I10 Essential (primary) hypertension: Secondary | ICD-10-CM | POA: Diagnosis not present

## 2019-11-25 DIAGNOSIS — N179 Acute kidney failure, unspecified: Secondary | ICD-10-CM | POA: Diagnosis present

## 2019-11-25 DIAGNOSIS — I7121 Aneurysm of the ascending aorta, without rupture: Secondary | ICD-10-CM | POA: Diagnosis present

## 2019-11-25 DIAGNOSIS — N183 Chronic kidney disease, stage 3 unspecified: Secondary | ICD-10-CM

## 2019-11-25 DIAGNOSIS — Z952 Presence of prosthetic heart valve: Secondary | ICD-10-CM | POA: Diagnosis not present

## 2019-11-25 DIAGNOSIS — I340 Nonrheumatic mitral (valve) insufficiency: Secondary | ICD-10-CM | POA: Diagnosis not present

## 2019-11-25 DIAGNOSIS — Z4502 Encounter for adjustment and management of automatic implantable cardiac defibrillator: Secondary | ICD-10-CM

## 2019-11-25 DIAGNOSIS — I480 Paroxysmal atrial fibrillation: Secondary | ICD-10-CM | POA: Diagnosis not present

## 2019-11-25 DIAGNOSIS — I130 Hypertensive heart and chronic kidney disease with heart failure and stage 1 through stage 4 chronic kidney disease, or unspecified chronic kidney disease: Secondary | ICD-10-CM | POA: Diagnosis not present

## 2019-11-25 DIAGNOSIS — R0602 Shortness of breath: Secondary | ICD-10-CM

## 2019-11-25 DIAGNOSIS — I509 Heart failure, unspecified: Secondary | ICD-10-CM

## 2019-11-25 DIAGNOSIS — Z888 Allergy status to other drugs, medicaments and biological substances status: Secondary | ICD-10-CM

## 2019-11-25 DIAGNOSIS — Z20828 Contact with and (suspected) exposure to other viral communicable diseases: Secondary | ICD-10-CM | POA: Diagnosis present

## 2019-11-25 DIAGNOSIS — H35031 Hypertensive retinopathy, right eye: Secondary | ICD-10-CM | POA: Diagnosis present

## 2019-11-25 DIAGNOSIS — F17220 Nicotine dependence, chewing tobacco, uncomplicated: Secondary | ICD-10-CM | POA: Diagnosis present

## 2019-11-25 DIAGNOSIS — I712 Thoracic aortic aneurysm, without rupture: Secondary | ICD-10-CM | POA: Diagnosis present

## 2019-11-25 DIAGNOSIS — Z8249 Family history of ischemic heart disease and other diseases of the circulatory system: Secondary | ICD-10-CM

## 2019-11-25 DIAGNOSIS — Z9841 Cataract extraction status, right eye: Secondary | ICD-10-CM

## 2019-11-25 DIAGNOSIS — Z8673 Personal history of transient ischemic attack (TIA), and cerebral infarction without residual deficits: Secondary | ICD-10-CM

## 2019-11-25 DIAGNOSIS — Z7982 Long term (current) use of aspirin: Secondary | ICD-10-CM

## 2019-11-25 LAB — BASIC METABOLIC PANEL
Anion gap: 10 (ref 5–15)
BUN: 28 mg/dL — ABNORMAL HIGH (ref 8–23)
CO2: 25 mmol/L (ref 22–32)
Calcium: 9.4 mg/dL (ref 8.9–10.3)
Chloride: 102 mmol/L (ref 98–111)
Creatinine, Ser: 1.55 mg/dL — ABNORMAL HIGH (ref 0.61–1.24)
GFR calc Af Amer: 50 mL/min — ABNORMAL LOW (ref >60)
GFR calc non Af Amer: 43 mL/min — ABNORMAL LOW (ref >60)
Glucose, Bld: 95 mg/dL (ref 70–99)
Potassium: 4.2 mmol/L (ref 3.5–5.1)
Sodium: 137 mmol/L (ref 135–145)

## 2019-11-25 LAB — CBC
HCT: 30.1 % — ABNORMAL LOW (ref 39.0–52.0)
Hemoglobin: 9.2 g/dL — ABNORMAL LOW (ref 13.0–17.0)
MCH: 26.7 pg (ref 26.0–34.0)
MCHC: 30.6 g/dL (ref 30.0–36.0)
MCV: 87.5 fL (ref 80.0–100.0)
Platelets: 225 10*3/uL (ref 150–400)
RBC: 3.44 MIL/uL — ABNORMAL LOW (ref 4.22–5.81)
RDW: 17.2 % — ABNORMAL HIGH (ref 11.5–15.5)
WBC: 6.1 10*3/uL (ref 4.0–10.5)
nRBC: 0 % (ref 0.0–0.2)

## 2019-11-25 LAB — BRAIN NATRIURETIC PEPTIDE: B Natriuretic Peptide: 1948.2 pg/mL — ABNORMAL HIGH (ref 0.0–100.0)

## 2019-11-25 LAB — LIPASE, BLOOD: Lipase: 40 U/L (ref 11–51)

## 2019-11-25 LAB — TROPONIN I (HIGH SENSITIVITY)
Troponin I (High Sensitivity): 18 ng/L — ABNORMAL HIGH (ref <18)
Troponin I (High Sensitivity): 19 ng/L — ABNORMAL HIGH (ref <18)
Troponin I (High Sensitivity): 20 ng/L — ABNORMAL HIGH (ref <18)

## 2019-11-25 LAB — PROTIME-INR
INR: 1.2 (ref 0.8–1.2)
Prothrombin Time: 15.3 seconds — ABNORMAL HIGH (ref 11.4–15.2)

## 2019-11-25 LAB — POC SARS CORONAVIRUS 2 AG -  ED: SARS Coronavirus 2 Ag: NEGATIVE

## 2019-11-25 MED ORDER — ASPIRIN EC 81 MG PO TBEC
81.0000 mg | DELAYED_RELEASE_TABLET | Freq: Every day | ORAL | Status: DC
  Administered 2019-11-26 – 2019-11-28 (×3): 81 mg via ORAL
  Filled 2019-11-25 (×3): qty 1

## 2019-11-25 MED ORDER — DORZOLAMIDE HCL-TIMOLOL MAL 2-0.5 % OP SOLN
1.0000 [drp] | Freq: Two times a day (BID) | OPHTHALMIC | Status: DC
  Administered 2019-11-26 – 2019-11-28 (×6): 1 [drp] via OPHTHALMIC
  Filled 2019-11-25: qty 10

## 2019-11-25 MED ORDER — NITROGLYCERIN 0.4 MG SL SUBL: 0.4000 mg | SUBLINGUAL_TABLET | SUBLINGUAL | Status: DC | PRN

## 2019-11-25 MED ORDER — FOLIC ACID 1 MG PO TABS
1.0000 mg | ORAL_TABLET | Freq: Every day | ORAL | Status: DC
  Administered 2019-11-26 – 2019-11-28 (×3): 1 mg via ORAL
  Filled 2019-11-25 (×3): qty 1

## 2019-11-25 MED ORDER — FERROUS SULFATE 325 (65 FE) MG PO TABS
325.0000 mg | ORAL_TABLET | Freq: Two times a day (BID) | ORAL | Status: DC
  Administered 2019-11-26 – 2019-11-28 (×5): 325 mg via ORAL
  Filled 2019-11-25 (×4): qty 1

## 2019-11-25 MED ORDER — CARVEDILOL 6.25 MG PO TABS
6.2500 mg | ORAL_TABLET | Freq: Two times a day (BID) | ORAL | Status: DC
  Administered 2019-11-26 – 2019-11-28 (×5): 6.25 mg via ORAL
  Filled 2019-11-25 (×5): qty 1

## 2019-11-25 MED ORDER — ENOXAPARIN SODIUM 40 MG/0.4ML ~~LOC~~ SOLN
40.0000 mg | SUBCUTANEOUS | Status: DC
  Administered 2019-11-25 – 2019-11-27 (×3): 40 mg via SUBCUTANEOUS
  Filled 2019-11-25 (×3): qty 0.4

## 2019-11-25 MED ORDER — PREDNISOLONE ACETATE 1 % OP SUSP
1.0000 [drp] | Freq: Three times a day (TID) | OPHTHALMIC | Status: DC
  Administered 2019-11-26 – 2019-11-28 (×10): 1 [drp] via OPHTHALMIC
  Filled 2019-11-25: qty 5

## 2019-11-25 MED ORDER — ACETAMINOPHEN 650 MG RE SUPP: 650.0000 mg | Freq: Four times a day (QID) | RECTAL | Status: DC | PRN

## 2019-11-25 MED ORDER — AMIODARONE HCL 200 MG PO TABS
200.0000 mg | ORAL_TABLET | Freq: Every day | ORAL | Status: DC
  Administered 2019-11-26 – 2019-11-28 (×3): 200 mg via ORAL
  Filled 2019-11-25 (×3): qty 1

## 2019-11-25 MED ORDER — POTASSIUM CHLORIDE CRYS ER 20 MEQ PO TBCR: 20.0000 meq | EXTENDED_RELEASE_TABLET | Freq: Every day | ORAL | Status: DC

## 2019-11-25 MED ORDER — ACETAMINOPHEN 325 MG PO TABS
650.0000 mg | ORAL_TABLET | Freq: Four times a day (QID) | ORAL | Status: DC | PRN
  Administered 2019-11-27: 650 mg via ORAL
  Filled 2019-11-25: qty 2

## 2019-11-25 MED ORDER — BRIMONIDINE TARTRATE 0.2 % OP SOLN
1.0000 [drp] | Freq: Two times a day (BID) | OPHTHALMIC | Status: DC
  Administered 2019-11-26 – 2019-11-28 (×6): 1 [drp] via OPHTHALMIC
  Filled 2019-11-25: qty 5

## 2019-11-25 MED ORDER — FUROSEMIDE 10 MG/ML IJ SOLN
40.0000 mg | Freq: Once | INTRAMUSCULAR | Status: AC
  Administered 2019-11-25: 40 mg via INTRAVENOUS
  Filled 2019-11-25: qty 4

## 2019-11-25 MED ORDER — ATROPINE SULFATE 1 % OP SOLN
1.0000 [drp] | Freq: Two times a day (BID) | OPHTHALMIC | Status: DC
  Administered 2019-11-26 – 2019-11-28 (×6): 1 [drp] via OPHTHALMIC
  Filled 2019-11-25: qty 2

## 2019-11-25 MED ORDER — ATORVASTATIN CALCIUM 10 MG PO TABS
10.0000 mg | ORAL_TABLET | Freq: Every day | ORAL | Status: DC
  Administered 2019-11-26 – 2019-11-28 (×3): 10 mg via ORAL
  Filled 2019-11-25 (×3): qty 1

## 2019-11-25 NOTE — Telephone Encounter (Signed)
-----   Message from Cristopher Estimable, RN sent at 11/25/2019 10:53 AM EST ----- Patient is sending a transmission, he is SOB trying to see if out of rhythm and optivol reading. I think he needs to go to the ER but he wants to do this first.

## 2019-11-25 NOTE — H&P (Signed)
History and Physical    [name] QIW:[id number] DOB: 09-25-1941 DOA: 11/25/2019  PCP: Patient, No Pcp Per  Patient coming from: Home.  Chief Complaint: Shortness of breath.  HPI: [name] is a 78 y.o. male with history of CAD status post redo CABG and bioprosthetic aortic valve replacement, A. fib not on anticoagulation secondary to history of GI bleed and intra-abdominal bleed, chronic kidney disease stage III creatinine baseline around 1.5 presents to the ER because of worsening shortness of breath both on exertion and orthopnea.  Has at least gained 6 to 7 pounds over the last few days.  Has had some chest pressure-like symptoms.  Denies any productive cough fever or chills.  Has not noticed any lower extremity swelling.  Has been compliant with his medications which he has been taking now including Lasix.  ED Course: In the ER patient was hypoxic and chest x-ray shows pleural effusion.  COVID-19 test was negative.  EKG shows paced rhythm.  Labs show creatinine around 1.5 which is around his baseline hemoglobin is 9.2 at baseline.  BNP was almost 2000.  High-sensitivity troponins were around 18 and 19.  Cardiology was consulted patient admitted for acute on chronic combined CHF for which patient was given Lasix 40 mg IV and at the time of my exam patient symptoms have improved has been admitted for further observation and management.  Review of Systems: As per HPI, rest all negative.   Past Medical History:  Diagnosis Date  . Anginal pain (HCC)   . Anxiety   . Aortic stenosis    a. s/p bioprosthetic AVR in 2010  . Arthritis    gout  . CAD (coronary artery disease)    a. s/p CABG in 1992 b. redo CABG in 2004 c. cath in 2010 showing patent LIMA-LAD, free RIMA-OM, and SVG-OM, and 80% stenosis of small PLA branch  . Chest pain   . CHF (congestive heart failure) (HCC)   . Dysrhythmia   . GERD (gastroesophageal reflux disease)    with gas and bloating probably cause of  chest pain  . H/O atrial flutter    a. s/p ablation and not on anticoagulation secondary to GIB and alcohol abuse.   . H/O: gout   . Hyperlipidemia   . Hypertension   . Hypertensive retinopathy    OU  . ICD (implantable cardiac defibrillator) infection, s/p removal of ICD 07/12/2012   re-implanted Medtronic BiV ICD, serial number [id number]   . Ischemic cardiomyopathy    a. EF previously 20-25%, improved to 40-45% by echo in 03/2013 b. 10/2017: echo showing of 30-35% with Grade 2 DD, normal function of AVR, moderate MR.   . Myocardial infarction (HCC)   . 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 Shortness of breath     Past Surgical History:  Procedure Laterality Date  . BI-VENTRICULAR IMPLANTABLE CARDIOVERTER DEFIBRILLATOR Right 08/18/2012   Medtronic BiV ICD, serial number [id number] ; Procedure: BI-VENTRICULAR IMPLANTABLE CARDIOVERTER DEFIBRILLATOR  (CRT-D);  Surgeon: Marinus Maw, MD;  Location: [facility] CATH LAB;  Service: Cardiovascular;  Laterality: Right;  . BIV ICD GENERATOR CHANGEOUT N/A 09/08/2018   Procedure: BIV ICD GENERATOR CHANGEOUT;  Surgeon: Thurmon Fair, MD;  Location: [facility] INVASIVE CV LAB;  Service: Cardiovascular;  Laterality: N/A;  . CARDIAC CATHETERIZATION  Dec 18, 2003   with a presentation of atrial flutter with rapid ventricular response. He had a cath and two stents to his PDA  after his SVG to his RCA. His other graft were patent. He had LV with EF of 30%.   . CARDIOVERSION N/A 05/30/2019   Procedure: CARDIOVERSION;  Surgeon: Thurmon Fairroitoru, Mihai, MD;  Location: [facility] ENDOSCOPY;  Service: Cardiovascular;  Laterality: N/A;  . CARDIOVERSION N/A 09/30/2019   Procedure: CARDIOVERSION;  Surgeon: Thurmon Fairroitoru, Mihai, MD;  Location: [facility] ENDOSCOPY;  Service: Cardiovascular;  Laterality: N/A;  . CAROTID ENDARTERECTOMY  11/2002   left in December 2003 and known renal artery stenosis of 60-70%  . CATARACT EXTRACTION Bilateral   . CORONARY ARTERY  BYPASS GRAFT  1993   redo in 2000, he has had multiple MI previous to both procedures.  [name]. EYE SURGERY Bilateral    Cat Sx OU  . IMPLANTABLE CARDIOVERTER DEFIBRILLATOR (ICD) GENERATOR CHANGE N/A 06/25/2012   Procedure: ICD GENERATOR CHANGE;  Surgeon: Marinus MawGregg W Taylor, MD;  [facility];  Service: Cardiovascular;  Laterality: N/A;  . VALVE REPLACEMENT  2010     reports that he has never smoked. His smokeless tobacco use includes chew. He reports current alcohol use of about 14.0 standard drinks of alcohol per week. He reports that he does not use drugs.  Allergies  Allergen Reactions  . Atorvastatin Other (See Comments)    Joint pain (patient takes this, however)  . Other Other (See Comments)    Patient is sodium-restricted, per his admission    Family History  Problem Relation Age of Onset  . CAD Brother     Prior to Admission medications   Medication Sig Start Date End Date Taking? Authorizing Provider  amiodarone (PACERONE) 200 MG tablet Take 1 tablet (200 mg total) by mouth daily. 04/22/19   Croitoru, Mihai, MD  aspirin EC 81 MG tablet Take 1 tablet (81 mg total) by mouth daily. 11/08/19   Croitoru, Mihai, MD  atorvastatin (LIPITOR) 10 MG tablet TAKE 1 TABLET BY MOUTH EVERY DAY 10/05/19   Croitoru, Mihai, MD  atropine 1 % ophthalmic solution Place 1 drop into the right eye 2 (two) times daily.    [provider]  brimonidine (ALPHAGAN) 0.2 % ophthalmic solution Place 1 drop into the right eye 2 (two) times daily. 10/13/19 10/12/20  Rennis ChrisZamora, Brian, MD  carvedilol (COREG) 6.25 MG tablet Take 1 tablet (6.25 mg total) by mouth 2 (two) times daily with a meal. 05/30/19   Croitoru, Mihai, MD  colchicine 0.6 MG tablet Take 1 tablet (0.6 mg total) by mouth as needed (gout attacks). 10/14/19   Jodelle GrossLawrence, Kathryn M, NP  dorzolamide-timolol (COSOPT) 22.3-6.8 MG/ML ophthalmic solution Place 1 drop into the right eye 2 (two) times daily.    [provider]  ferrous sulfate 325  (65 FE) MG EC tablet Take 1 tablet (325 mg total) by mouth 2 (two) times daily. 09/28/19   Croitoru, Mihai, MD  folic acid (FOLVITE) 1 MG tablet Take 1 tablet (1 mg total) by mouth daily. 09/28/19   Croitoru, Mihai, MD  furosemide (LASIX) 40 MG tablet TAKE 1 TABLET BY MOUTH TWICE A DAY 11/22/19   Croitoru, Mihai, MD  Menthol, Topical Analgesic, (BENGAY EX) Apply 1 application topically daily as needed (pain).    [provider]  nitroGLYCERIN (NITROSTAT) 0.4 MG SL tablet Place 1 tablet (0.4 mg) under the tongue every 5 minutes as needed for chest pain for up to 3 doses/call 9-1-1 and MD if no relief 07/26/18   Croitoru, Mihai, MD  potassium chloride SA (KLOR-CON) 20 MEQ tablet Take 1 tablet (20 mEq total) by  mouth daily. 10/14/19   Abelino Derrick, PA-C  prednisoLONE acetate (PRED FORTE) 1 % ophthalmic suspension Place 1 drop into the right eye 4 (four) times daily. 11/01/19   Rennis Chris, MD  predniSONE (DELTASONE) 10 MG tablet Take 20 mg (2 tablets) per day for 3 days and then take 10 mg (1 tablet) per day for 3 days. 11/10/19   Croitoru, Rachelle Hora, MD    Physical Exam: Constitutional: Moderately built and nourished. Vitals:   11/25/19 2045 11/25/19 2100 11/25/19 2115 11/25/19 2130  BP: 138/86 135/67 (!) 146/82 133/83  Pulse: (!) 58 65    Resp: (!) 22 (!) 21 (!) 24 18  Temp:      TempSrc:      SpO2: 97% 95%     Eyes: Anicteric no pallor. ENMT: No discharge from the ears eyes nose or mouth. Neck: JVD elevated no mass felt. Respiratory: No rhonchi or crepitations. Cardiovascular: S1-S2 heard. Abdomen: Soft nontender bowel sounds present. Musculoskeletal: No edema. Skin: No rash. Neurologic: Alert awake oriented to time place and person.  Moves all extremities. Psychiatric: Appears normal per normal affect.   Labs on Admission: I have personally reviewed following labs and imaging studies  CBC: Recent Labs  Lab 11/25/19 1413  WBC 6.1  HGB 9.2*  HCT 30.1*  MCV 87.5  PLT 225    Basic Metabolic Panel: Recent Labs  Lab 11/25/19 1413  NA 137  K 4.2  CL 102  CO2 25  GLUCOSE 95  BUN 28*  CREATININE 1.55*  CALCIUM 9.4   GFR: CrCl cannot be calculated (Unknown ideal weight.). Liver Function Tests: No results for input(s): AST, ALT, ALKPHOS, BILITOT, PROT, ALBUMIN in the last 168 hours. Recent Labs  Lab 11/25/19 1855  LIPASE 40   No results for input(s): AMMONIA in the last 168 hours. Coagulation Profile: Recent Labs  Lab 11/25/19 1413  INR 1.2   Cardiac Enzymes: No results for input(s): CKTOTAL, CKMB, CKMBINDEX, TROPONINI in the last 168 hours. BNP (last 3 results) No results for input(s): PROBNP in the last 8760 hours. HbA1C: No results for input(s): HGBA1C in the last 72 hours. CBG: No results for input(s): GLUCAP in the last 168 hours. Lipid Profile: No results for input(s): CHOL, HDL, LDLCALC, TRIG, CHOLHDL, LDLDIRECT in the last 72 hours. Thyroid Function Tests: No results for input(s): TSH, T4TOTAL, FREET4, T3FREE, THYROIDAB in the last 72 hours. Anemia Panel: No results for input(s): VITAMINB12, FOLATE, FERRITIN, TIBC, IRON, RETICCTPCT in the last 72 hours. Urine analysis:    Component Value Date/Time   COLORURINE YELLOW 03/08/2018 1832   APPEARANCEUR Clear 09/26/2019 1052   LABSPEC 1.012 03/08/2018 1832   PHURINE 6.0 03/08/2018 1832   GLUCOSEU Negative 09/26/2019 1052   HGBUR SMALL (A) 03/08/2018 1832   BILIRUBINUR Negative 09/26/2019 1052   KETONESUR NEGATIVE 03/08/2018 1832   PROTEINUR Negative 09/26/2019 1052   PROTEINUR NEGATIVE 03/08/2018 1832   UROBILINOGEN 0.2 03/02/2013 1646   NITRITE Negative 09/26/2019 1052   NITRITE NEGATIVE 03/08/2018 1832   LEUKOCYTESUR Negative 09/26/2019 1052   Sepsis Labs: (procalcitonin:4,lacticidven:4) )No results found for this or any previous visit (from the past 240 hour(s)).   Radiological Exams on Admission: Dg Chest 2 View  Result Date: 11/25/2019 CLINICAL DATA:   Progressive chest pain and shortness of breath. EXAM: CHEST - 2 VIEW COMPARISON:  06/05/2017 FINDINGS: Stable mild cardiomegaly. CABG. AICD in place. Pulmonary vascularity is normal. Aortic atherosclerosis. Small new right pleural effusion. Minimal atelectasis at the left lung base  laterally. No significant bone abnormality. IMPRESSION: 1. New small right pleural effusion. 2. Minimal atelectasis at the left lung base. 3. Stable mild cardiomegaly. 4. Aortic Atherosclerosis (ICD10-I70.0). Electronically Signed   By: Lorriane Shire M.D.   On: 11/25/2019 13:41    EKG: Independently reviewed.  Paced rhythm.  Assessment/Plan Principal Problem:   Acute on chronic combined systolic and diastolic CHF, NYHA class 3 (HCC) Active Problems:   Essential hypertension   Peripheral vascular disease (HCC)   CKD (chronic kidney disease) stage 3, GFR 30-59 ml/min   CORONARY ARTERY BYPASS GRAFT, HX OF   H/O aortic valve replacement   PAF (paroxysmal atrial fibrillation) (HCC)   SA node dysfunction (HCC)   Ascending aortic aneurysm ([place])   ICD (implantable cardioverter-defibrillator) battery depletion    1. Acute on chronic combined CHF status post CRT last EF measured was 30 to 35% with grade 3 diastolic dysfunction in March 2019.  Appreciate cardiology consult.  We will continue with IV Lasix 40 mg every 12 closely follow intake output metabolic panel and daily weights.  Not on ACE inhibitors or ARB due to renal failure.  On Coreg.  Cardiology recommended to recheck 2D echo. 2. History of CAD status post CABG has had some chest pressure.  Troponins are stable.  Patient is on aspirin statin and beta-blocker. 3. History of A. fib not on anticoagulation secondary to GI bleed and intracranial bleed presently on amiodarone and Coreg.  Rate controlled. 4. Chronic kidney disease stage III creatinine appears to be at baseline closely follow metabolic panel. 5. Ascending aortic aneurysm closely observe. 6. History of  bioprosthetic aortic valve replacement -follow 2D echo.  COVID-19 was negative.  Given that patient has significant weight gain from baseline with respiratory failure secondary to CHF will need more than 2 midnight stay in inpatient status.   DVT prophylaxis: Lovenox. Code Status: Full code. Family Communication: Discussed with patient. Disposition Plan: Home. Consults called: Cardiology. Admission status: Inpatient.   Rise Patience MD [facility] Pager [phone].  If 7PM-7AM, please contact night-coverage [email or web address] Password [facility]  11/25/2019, 10:27 PM

## 2019-11-25 NOTE — ED Provider Notes (Signed)
[facility] EMERGENCY DEPARTMENT Provider Note   CSN: [id number] Arrival date & time: 11/25/19  1307     History   Chief Complaint Chief Complaint  Patient presents with   Shortness of Breath   Chest Pain    HPI GABERIAL CADA is a 78 y.o. male.     HPI  Mr. Casteneda is a 78 year old male with past medical history of CAD status post CABG, CHF (EF 30-35%, status post ICD), Aortic valve replacement with biologic prosthesis, hypertension, hyperlipidemia, Hx of Afib (not on [facility]) who presents to the ED with shortness of breath. Patient and daughter report he has been having worsening shortness of breath over the last 3 to 4 days.  Patient states shortness of breath is worse with exertion and moving around.  He also states is worse with lying flat.  He has not noticed any swelling in his legs or stomach.  He has been taking all his medications as prescribed.  Patient also endorses a discomfort in his upper abdomen/lower chest.  He states this has been present on and off for the last year but has been more constant recently.  Patient states he has a chronic cough that is unchanged.  No fever.  No known sick contacts.  Patient denies any overt chest pain.  Patient and daughter unsure if he has ever had any blood clots.  No leg swelling or pain.  Patient did recently travel to [place] but they state they took frequent stops.   Past Medical History:  Diagnosis Date   Anginal pain (HCC)    Anxiety    Aortic stenosis    a. s/p bioprosthetic AVR in 2010   Arthritis    gout   CAD (coronary artery disease)    a. s/p CABG in 1992 b. redo CABG in 2004 c. cath in 2010 showing patent LIMA-LAD, free RIMA-OM, and SVG-OM, and 80% stenosis of small PLA branch   Chest pain    CHF (congestive heart failure) (HCC)    Dysrhythmia    GERD (gastroesophageal reflux disease)    with gas and bloating probably cause of chest pain   H/O atrial flutter    a. s/p ablation and  not on anticoagulation secondary to GIB and alcohol abuse.    H/O: gout    Hyperlipidemia    Hypertension    Hypertensive retinopathy    OU   ICD (implantable cardiac defibrillator) infection, s/p removal of ICD 07/12/2012   re-implanted Medtronic BiV ICD, serial number [id number]    Ischemic cardiomyopathy    a. EF previously 20-25%, improved to 40-45% by echo in 03/2013 b. 10/2017: echo showing of 30-35% with Grade 2 DD, normal function of AVR, moderate MR.    Myocardial infarction [facility])    PVD (peripheral vascular disease) (HCC)    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   Shortness of breath     Patient Active Problem List   Diagnosis Date Noted   Acute on chronic combined systolic and diastolic CHF, NYHA class 3 (HCC) 11/25/2019   ICD (implantable cardioverter-defibrillator) battery depletion 09/08/2018   CAD of autologous vein bypass graft without angina 05/10/2018   Prolonged QT interval 03/07/2018   Elevated troponin 03/07/2018   Hypokalemia 03/06/2018   AKI (acute kidney injury) (HCC) 03/06/2018   Postural dizziness with presyncope 03/06/2018   Encounter for monitoring amiodarone therapy 01/25/2018   Ascending aortic aneurysm (HCC) 01/25/2018   Acute on chronic combined systolic and diastolic CHF (congestive heart failure) (  HCC) 01/15/2017   SA node dysfunction (HCC) 01/15/2017   B12 deficiency anemia    Esophageal reflux    PAF (paroxysmal atrial fibrillation) (HCC)    HLD (hyperlipidemia)    Alcohol withdrawal (HCC) 01/31/2016   Left-sided weakness 01/29/2016   Neck pain 01/29/2016   Clavicle fracture 01/29/2016   H/O aortic valve replacement 06/04/2014   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   CRT-D Medtronic 07/12/2012   Hypothyroidism 03/14/2009   Hyperlipidemia 03/14/2009   GOUT 03/14/2009   ALCOHOLISM 03/14/2009   Essential hypertension 03/14/2009   Cardiomyopathy, ischemic 03/14/2009   CEREBROVASCULAR DISEASE  03/14/2009   Peripheral vascular disease (HCC) 03/14/2009   CKD (chronic kidney disease) stage 3, GFR 30-59 ml/min 03/14/2009   ANEMIA, HX OF 03/14/2009   History of bilateral carotid endarterectomy 03/14/2009   CORONARY ARTERY BYPASS GRAFT, HX OF 03/14/2009   CAD s/p redo CABG 01/17/2009   ATRIAL FIBRILLATION 01/17/2009   COMBINED HEART FAILURE, CHRONIC 01/17/2009    Past Surgical History:  Procedure Laterality Date   BI-VENTRICULAR IMPLANTABLE CARDIOVERTER DEFIBRILLATOR Right 08/18/2012   Medtronic BiV ICD, serial number [id number] ; Procedure: BI-VENTRICULAR IMPLANTABLE CARDIOVERTER DEFIBRILLATOR  (CRT-D);  Surgeon: Marinus Maw, MD;  Location: [facility] CATH LAB;  Service: Cardiovascular;  Laterality: Right;   BIV ICD GENERATOR CHANGEOUT N/A 09/08/2018   Procedure: BIV ICD GENERATOR CHANGEOUT;  Surgeon: Thurmon Fair, MD;  Location: [facility] INVASIVE CV LAB;  Service: Cardiovascular;  Laterality: N/A;   CARDIAC CATHETERIZATION  Dec 18, 2003   with a presentation of atrial flutter with rapid ventricular response. He had a cath and two stents to his PDA after his SVG to his RCA. His other graft were patent. He had LV with EF of 30%.    CARDIOVERSION N/A 05/30/2019   Procedure: CARDIOVERSION;  Surgeon: Thurmon Fair, MD;  Location: [facility] ENDOSCOPY;  Service: Cardiovascular;  Laterality: N/A;   CARDIOVERSION N/A 09/30/2019   Procedure: CARDIOVERSION;  Surgeon: Thurmon Fair, MD;  Location: [facility] ENDOSCOPY;  Service: Cardiovascular;  Laterality: N/A;   CAROTID ENDARTERECTOMY  11/2002   left in December 2003 and known renal artery stenosis of 60-70%   CATARACT EXTRACTION Bilateral    CORONARY ARTERY BYPASS GRAFT  1993   redo in 2000, he has had multiple MI previous to both procedures.   EYE SURGERY Bilateral    Cat Sx OU   IMPLANTABLE CARDIOVERTER DEFIBRILLATOR (ICD) GENERATOR CHANGE N/A 06/25/2012   Procedure: ICD GENERATOR CHANGE;  Surgeon: Marinus Maw, MD;  Location: [facility] CATH LAB;   Service: Cardiovascular;  Laterality: N/A;   VALVE REPLACEMENT  2010        Home Medications    Prior to Admission medications   Medication Sig Start Date End Date Taking? Authorizing Provider  amiodarone (PACERONE) 200 MG tablet Take 1 tablet (200 mg total) by mouth daily. 04/22/19   Croitoru, Mihai, MD  aspirin EC 81 MG tablet Take 1 tablet (81 mg total) by mouth daily. 11/08/19   Croitoru, Mihai, MD  atorvastatin (LIPITOR) 10 MG tablet TAKE 1 TABLET BY MOUTH EVERY DAY 10/05/19   Croitoru, Mihai, MD  atropine 1 % ophthalmic solution Place 1 drop into the right eye 2 (two) times daily.    [provider]  brimonidine (ALPHAGAN) 0.2 % ophthalmic solution Place 1 drop into the right eye 2 (two) times daily. 10/13/19 10/12/20  Rennis Chris, MD  carvedilol (COREG) 6.25 MG tablet Take 1 tablet (6.25 mg total) by mouth 2 (two) times daily with a meal.  05/30/19   Croitoru, Mihai, MD  colchicine 0.6 MG tablet Take 1 tablet (0.6 mg total) by mouth as needed (gout attacks). 10/14/19   Jodelle Gross, NP  dorzolamide-timolol (COSOPT) 22.3-6.8 MG/ML ophthalmic solution Place 1 drop into the right eye 2 (two) times daily.    [provider]  ferrous sulfate 325 (65 FE) MG EC tablet Take 1 tablet (325 mg total) by mouth 2 (two) times daily. 09/28/19   Croitoru, Mihai, MD  folic acid (FOLVITE) 1 MG tablet Take 1 tablet (1 mg total) by mouth daily. 09/28/19   Croitoru, Mihai, MD  furosemide (LASIX) 40 MG tablet TAKE 1 TABLET BY MOUTH TWICE A DAY 11/22/19   Croitoru, Mihai, MD  Menthol, Topical Analgesic, (BENGAY EX) Apply 1 application topically daily as needed (pain).    [provider]  nitroGLYCERIN (NITROSTAT) 0.4 MG SL tablet Place 1 tablet (0.4 mg) under the tongue every 5 minutes as needed for chest pain for up to 3 doses/call 9-1-1 and MD if no relief 07/26/18   Croitoru, Mihai, MD  potassium chloride SA (KLOR-CON) 20 MEQ tablet Take 1 tablet (20 mEq total) by mouth daily.  10/14/19   Abelino Derrick, PA-C  prednisoLONE acetate (PRED FORTE) 1 % ophthalmic suspension Place 1 drop into the right eye 4 (four) times daily. 11/01/19   Rennis Chris, MD  predniSONE (DELTASONE) 10 MG tablet Take 20 mg (2 tablets) per day for 3 days and then take 10 mg (1 tablet) per day for 3 days. 11/10/19   Croitoru, Mihai, MD    Family History Family History  Problem Relation Age of Onset   CAD Brother     Social History Social History   Tobacco Use   Smoking status: Never Smoker   Smokeless tobacco: Current User    Types: Chew  Substance Use Topics   Alcohol use: Yes    Alcohol/week: 14.0 standard drinks    Types: 14 Cans of beer per week   Drug use: No     Allergies   Atorvastatin and Other   Review of Systems Review of Systems  Constitutional: Negative for chills and fever.  HENT: Negative for ear pain and sore throat.   Eyes: Positive for visual disturbance (Subacute, improving). Negative for pain.  Respiratory: Positive for cough (Chronic) and shortness of breath.   Cardiovascular: Negative for chest pain and palpitations.  Gastrointestinal: Positive for abdominal pain. Negative for vomiting.  Genitourinary: Negative for dysuria and hematuria.  Musculoskeletal: Negative for arthralgias and back pain.  Skin: Negative for color change and rash.  Neurological: Negative for seizures and syncope.  Psychiatric/Behavioral: Negative for agitation and behavioral problems.  All other systems reviewed and are negative.   Physical Exam Updated Vital Signs BP 133/83    Pulse 65    Temp 97.7 F (36.5 C) (Oral)    Resp 18    SpO2 95%   Physical Exam Vitals signs and nursing note reviewed.  Constitutional:      Appearance: He is well-developed.  HENT:     Head: Normocephalic and atraumatic.  Eyes:     Extraocular Movements: Extraocular movements intact.     Conjunctiva/sclera: Conjunctivae normal.  Neck:     Musculoskeletal: Neck supple.    Cardiovascular:     Rate and Rhythm: Normal rate and regular rhythm.     Pulses: Normal pulses.     Comments: R subclavicular PM palpable  Pulmonary:     Effort: Pulmonary effort is normal. No  respiratory distress.     Breath sounds: Normal breath sounds. No wheezing.  Abdominal:     Palpations: Abdomen is soft.     Tenderness: There is no abdominal tenderness.  Musculoskeletal:     Right lower leg: No edema.     Left lower leg: No edema.  Skin:    General: Skin is warm and dry.  Neurological:     General: No focal deficit present.     Mental Status: He is alert and oriented to person, place, and time.  Psychiatric:        Mood and Affect: Mood normal.        Behavior: Behavior normal.      ED Treatments / Results  Labs (all labs ordered are listed, but only abnormal results are displayed) Labs Reviewed  BASIC METABOLIC PANEL - Abnormal; Notable for the following components:      Result Value   BUN 28 (*)    Creatinine, Ser 1.55 (*)    GFR calc non Af Amer 43 (*)    GFR calc Af Amer 50 (*)    All other components within normal limits  CBC - Abnormal; Notable for the following components:   RBC 3.44 (*)    Hemoglobin 9.2 (*)    HCT 30.1 (*)    RDW 17.2 (*)    All other components within normal limits  PROTIME-INR - Abnormal; Notable for the following components:   Prothrombin Time 15.3 (*)    All other components within normal limits  BRAIN NATRIURETIC PEPTIDE - Abnormal; Notable for the following components:   B Natriuretic Peptide 1,948.2 (*)    All other components within normal limits  TROPONIN I (HIGH SENSITIVITY) - Abnormal; Notable for the following components:   Troponin I (High Sensitivity) 18 (*)    All other components within normal limits  TROPONIN I (HIGH SENSITIVITY) - Abnormal; Notable for the following components:   Troponin I (High Sensitivity) 19 (*)    All other components within normal limits  TROPONIN I (HIGH SENSITIVITY) - Abnormal; Notable  for the following components:   Troponin I (High Sensitivity) 20 (*)    All other components within normal limits  SARS CORONAVIRUS 2 (TAT 6-24 HRS)  LIPASE, BLOOD  D-DIMER, QUANTITATIVE (NOT AT [facility])  BASIC METABOLIC PANEL  CBC  CBC  CREATININE, SERUM  TSH  POC SARS CORONAVIRUS 2 AG -  ED  TROPONIN I (HIGH SENSITIVITY)    EKG EKG Interpretation  Date/Time:  Friday November 25 2019 13:10:13 EST Ventricular Rate:  63 PR Interval:    QRS Duration: 192 QT Interval:  540 QTC Calculation: 552 R Axis:   -84 Text Interpretation: AV dual-paced rhythm with prolonged AV conduction Biventricular pacemaker detected Abnormal ECG Previous T wave inversions in V3 now upright, otherwise similar to previous Confirmed by Theotis Burrow [phone]) on 11/25/2019 7:10:17 PM   Radiology Dg Chest 2 View  Result Date: 11/25/2019 CLINICAL DATA:  Progressive chest pain and shortness of breath. EXAM: CHEST - 2 VIEW COMPARISON:  06/05/2017 FINDINGS: Stable mild cardiomegaly. CABG. AICD in place. Pulmonary vascularity is normal. Aortic atherosclerosis. Small new right pleural effusion. Minimal atelectasis at the left lung base laterally. No significant bone abnormality. IMPRESSION: 1. New small right pleural effusion. 2. Minimal atelectasis at the left lung base. 3. Stable mild cardiomegaly. 4. Aortic Atherosclerosis (ICD10-I70.0). Electronically Signed   By: Lorriane Shire M.D.   On: 11/25/2019 13:41    Procedures Procedures (including critical care time)  Medications Ordered in ED Medications  aspirin EC tablet 81 mg (has no administration in time range)  amiodarone (PACERONE) tablet 200 mg (has no administration in time range)  atorvastatin (LIPITOR) tablet 10 mg (has no administration in time range)  carvedilol (COREG) tablet 6.25 mg (has no administration in time range)  nitroGLYCERIN (NITROSTAT) SL tablet 0.4 mg (has no administration in time range)  ferrous sulfate EC tablet 325 mg (has no  administration in time range)  folic acid (FOLVITE) tablet 1 mg (has no administration in time range)  potassium chloride SA (KLOR-CON) CR tablet 20 mEq (has no administration in time range)  atropine 1 % ophthalmic solution 1 drop (has no administration in time range)  brimonidine (ALPHAGAN) 0.2 % ophthalmic solution 1 drop (has no administration in time range)  dorzolamide-timolol (COSOPT) 22.3-6.8 MG/ML ophthalmic solution 1 drop (has no administration in time range)  prednisoLONE acetate (PRED FORTE) 1 % ophthalmic suspension 1 drop (has no administration in time range)  acetaminophen (TYLENOL) tablet 650 mg (has no administration in time range)    Or  acetaminophen (TYLENOL) suppository 650 mg (has no administration in time range)  enoxaparin (LOVENOX) injection 40 mg (has no administration in time range)  furosemide (LASIX) injection 40 mg (40 mg Intravenous Given 11/25/19 2136)     Initial Impression / Assessment and Plan / ED Course  I have reviewed the triage vital signs and the nursing notes.  Pertinent labs & imaging results that were available during my care of the patient were reviewed by me and considered in my medical decision making (see chart for details).  JOEDY EICKHOFF was evaluated in Emergency Department on 11/25/2019 for the symptoms described in the history of present illness. He was evaluated in the context of the global COVID-19 pandemic, which necessitated consideration that the patient might be at risk for infection with the SARS-CoV-2 virus that causes COVID-19. Institutional protocols and algorithms that pertain to the evaluation of patients at risk for COVID-19 are in a state of rapid change based on information released by regulatory bodies including the CDC and federal and state organizations. These policies and algorithms were followed during the patient's care in the ED.      On arrival, pt is afebrile, HDS.  Considered: Viral URI, covid 19, PNA, CHF  exacerbation, ACS, PE Abdomen soft, benign, mild epigastric TTP, lipase wnl, doubt pancreatitis   EKG: AV paced rhythm, no obvious ischemic findings CXR: Small R pleural effusion Per phone note with Cardiology today: "remote transmission reviewed and patient has no episodes of HVR or AMS episodes. Device function WNL."  Initial troponin 18 -> 19  No leukocytosis, Hgb stable  BNP found to be elevated greater than baseline.  Patient noted to have desaturations to 86% on room air but recovered without requiring oxygen.  Patient has gained 6 pounds recently.  He does not appear overtly clinically fluid overloaded, however in setting of elevated BNP, orthopnea and exertional dyspnea as well as new pleural effusion, concern for CHF exacerbation.  Patient given IV Lasix.  Given patient's worsening epigastric discomfort and history of prior CABG, as well as detectable troponin, also considered that symptoms could be secondary to an anginal equivalent.  Cardiology consulted.  They have kindly agreed to see and evaluate the patient in the emergency department for further recommendations.  Hospitalist contacted for admission.  Patient's daughter was updated on plan of care. Hospitalist has assumed care of patient at this time.   Final Clinical Impressions(s) /  ED Diagnoses   Final diagnoses:  Acute on chronic congestive heart failure, unspecified heart failure type [facility](HCC)  Shortness of breath    ED Discharge Orders    None       Norton PastelKerby, Charlane Westry, MD 11/25/19 2259    Little, Ambrose Finlandachel Morgan, MD 11/30/19 1334

## 2019-11-25 NOTE — ED Notes (Signed)
[name] (daughter)  [phone]  Please call when pt is moved from lobby to a ED room

## 2019-11-25 NOTE — ED Notes (Signed)
Pt ambulated in room with no assistance, O2 sat went from 97% to 91%. Returned to 95% prior to leaving the room with deep breathing.

## 2019-11-25 NOTE — ED Triage Notes (Signed)
Pt here for evaluation of 4-5 days of shortness of breath both at rest and with exertion. Endorses intermittent chest pain. Hx open heart surgery x 4. Denies sick contacts or fevers.

## 2019-11-25 NOTE — Consult Note (Signed)
CARDIOLOGY CONSULT NOTE   Referring Physician: Dr. Burns Spain Primary Physician: N/A Primary Cardiologist: Dr. Sallyanne Kuster Reason for Consultation: shortness of breath   HPI: [name] is a 78 y.o. 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   Patient reports about 2 weeks of worsening exertional dyspnea. He has a long history of heart failure and has had multiple exacerbations over the years. Typically his weight is around 168 lbs however he states he has gained about 10 lbs over the past few weeks. He has been taking all of his medications as prescribed including his diuretic and has not had any recent changes to his diet. He states that he typically holds onto fluid in his legs but this time he doesn't have any leg swelling. In addition to worsening exertional dyspnea he describes progressive orthopnea as well which is a new symptom for him. He denies any chest pain or chest pressure. He has not had any ICD firing episodes. He denies palpitations, syncope or presyncope.   In the ED the patient was found to have a small new right sided pleural effusion and minimal atelectasis at the left lung base. His BNP was elevated to 2,000 and his creatinine was stable at 1.55. His troponin was minimally elevated and stable at 20. He was given 1 dose of IV furosemide and admitted to the hospitalist service.   Review of Systems:     Cardiac Review of Systems: {Y] = yes _0  = no  Chest Pain [    ]  Resting SOB [   ] Exertional SOB  [ Y ]  Orthopnea [  ]   Pedal Edema [   ]    Palpitations [  ] Syncope  [  ]   Presyncope [   ]  General Review of Systems: [Y] = yes [  ]=no Constitional: recent weight change [  ]; anorexia [  ]; fatigue [  ]; nausea [  ]; night sweats [   ]; fever [  ]; or chills [  ];                                                                     Eyes : blurred vision [  ]; diplopia [   ]; vision changes [  ];  Amaurosis fugax[  ]; Resp: cough [  ];  wheezing[  ];  hemoptysis[  ];  PND [  ];  GI:  gallstones[  ], vomiting[  ];  dysphagia[  ]; melena[  ];  hematochezia [  ]; heartburn[  ];   GU: kidney stones [  ]; hematuria[  ];   dysuria [  ];  nocturia[  ]; incontinence [  ];             Skin: rash, swelling[  ];, hair loss[  ];  peripheral edema[  ];  or itching[  ]; Musculosketetal: myalgias[  ];  joint swelling[  ];  joint erythema[  ];  joint pain[  ];  back pain[  ];  Heme/Lymph: bruising[  ];  bleeding[  ];  anemia[  ];  Neuro: TIA[  ];  headaches[  ];  stroke[  ];  vertigo[  ];  seizures[  ];   paresthesias[  ];  difficulty walking[  ];  Psych:depression[  ]; anxiety[  ];  Endocrine: diabetes[  ];  thyroid dysfunction[  ];  Other:  Past Medical History:  Diagnosis Date  . Anginal pain ([place])   . Anxiety   . Aortic stenosis    a. s/p bioprosthetic AVR in 2010  . Arthritis    gout  . CAD (coronary artery disease)    a. s/p CABG in 1992 b. redo CABG in 2004 c. cath in 2010 showing patent LIMA-LAD, free RIMA-OM, and SVG-OM, and 80% stenosis of small PLA branch  . Chest pain   . CHF (congestive heart failure) ([place])   . Dysrhythmia   . GERD (gastroesophageal reflux disease)    with gas and bloating probably cause of chest pain  . H/O atrial flutter    a. s/p ablation and not on anticoagulation secondary to GIB and alcohol abuse.   . H/O: gout   . Hyperlipidemia   . Hypertension   . Hypertensive retinopathy    OU  . ICD (implantable cardiac defibrillator) infection, s/p removal of ICD 07/12/2012   re-implanted [employer] BiV ICD, serial number [id number]   . Ischemic cardiomyopathy    a. EF previously 20-25%, improved to 40-45% by echo in 03/2013 b. 10/2017: echo showing of 30-35% with Grade 2 DD, normal function of AVR,  moderate MR.   . Myocardial infarction ([place])   . 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 Shortness of breath     (Not in a hospital admission)    . [START ON 11/26/2019] amiodarone  200 mg Oral Daily  . [START ON 11/26/2019] aspirin EC  81 mg Oral Daily  . [START ON 11/26/2019] atorvastatin  10 mg Oral Daily  . atropine  1 drop Right Eye BID  . brimonidine  1 drop Right Eye BID  . [START ON 11/26/2019] carvedilol  6.25 mg Oral BID WC  . dorzolamide-timolol  1 drop Right Eye BID  . enoxaparin (LOVENOX) injection  40 mg Subcutaneous Q24H  . ferrous sulfate  325 mg Oral BID  . [START ON 48/07/8915] folic acid  1 mg Oral Daily  . [START ON 11/26/2019] potassium chloride SA  20 mEq Oral Daily  . prednisoLONE acetate  1 drop Right Eye QID  . sodium chloride flush  3 mL Intravenous Q12H    Infusions: N/A   Allergies  Allergen Reactions  . Atorvastatin Other (See Comments)    Joint pain (patient takes this, however)  . Other Other (See Comments)    Patient is sodium-restricted, per his admission    Social History   Socioeconomic History  . Marital status: Married    Spouse name: Not on file  . Number of children: Not on file  . Years of education: Not on file  . Highest education level: Not on file  Occupational History  . Not on file  Social Needs  . Financial resource strain: Not on file  . Food insecurity    Worry: Not on file    Inability: Not on file  . Transportation needs    Medical: Not on file    Non-medical: Not on file  Tobacco Use  . Smoking status:  Never Smoker  . Smokeless tobacco: Current User    Types: Chew  Substance and Sexual Activity  . Alcohol use: Yes    Alcohol/week: 14.0 standard drinks    Types: 14 Cans of beer per week  . Drug use: No  . Sexual activity: Not on file  Lifestyle  . Physical activity    Days per week: Not on file    Minutes per session: Not on file  . Stress: Not on file   Relationships  . Social Herbalist on phone: Not on file    Gets together: Not on file    Attends religious service: Not on file    Active member of club or organization: Not on file    Attends meetings of clubs or organizations: Not on file    Relationship status: Not on file  . Intimate partner violence    Fear of current or ex partner: Not on file    Emotionally abused: Not on file    Physically abused: Not on file    Forced sexual activity: Not on file  Other Topics Concern  . Not on file  Social History Narrative  . Not on file    Family History  Problem Relation Age of Onset  . CAD Brother     PHYSICAL EXAM: Vitals:   11/25/19 2115 11/25/19 2130  BP: (!) 146/82 133/83  Pulse:    Resp: (!) 24 18  Temp:    SpO2:      No intake or output data in the 24 hours ending 11/25/19 2245  General:  Well appearing. Pleasant. No respiratory difficulty HEENT: normal Neck: supple. JVP elevated to ~12 cm H2O. Carotids 2+ bilat; no bruits. No lymphadenopathy or thryomegaly appreciated. Cor: PMI nondisplaced. Regular rate & rhythm. +S4. No appreciable murmurs. Lungs: clear Abdomen: soft, nontender, nondistended. No hepatosplenomegaly. No bruits or masses. Good bowel sounds. Extremities: no cyanosis, clubbing, rash, 1+ pitting edema of the posterior ankles bilaterally extended about half way to knee Neuro: alert & oriented x 3, cranial nerves grossly intact. moves all 4 extremities w/o difficulty. Affect pleasant.  ECG: AV paced rhythm  Results for orders placed or performed during the hospital encounter of 11/25/19 (from the past 24 hour(s))  Basic metabolic panel     Status: Abnormal   Collection Time: 11/25/19  2:13 PM  Result Value Ref Range   Sodium 137 135 - 145 mmol/L   Potassium 4.2 3.5 - 5.1 mmol/L   Chloride 102 98 - 111 mmol/L   CO2 25 22 - 32 mmol/L   Glucose, Bld 95 70 - 99 mg/dL   BUN 28 (H) 8 - 23 mg/dL   Creatinine, Ser 1.55 (H) 0.61 - 1.24 mg/dL    Calcium 9.4 8.9 - 10.3 mg/dL   GFR calc non Af Amer 43 (L) >60 mL/min   GFR calc Af Amer 50 (L) >60 mL/min   Anion gap 10 5 - 15  CBC     Status: Abnormal   Collection Time: 11/25/19  2:13 PM  Result Value Ref Range   WBC 6.1 4.0 - 10.5 K/uL   RBC 3.44 (L) 4.22 - 5.81 MIL/uL   Hemoglobin 9.2 (L) 13.0 - 17.0 g/dL   HCT 30.1 (L) 39.0 - 52.0 %   MCV 87.5 80.0 - 100.0 fL   MCH 26.7 26.0 - 34.0 pg   MCHC 30.6 30.0 - 36.0 g/dL   RDW 17.2 (H) 11.5 - 15.5 %   Platelets  225 150 - 400 K/uL   nRBC 0.0 0.0 - 0.2 %  Troponin I (High Sensitivity)     Status: Abnormal   Collection Time: 11/25/19  2:13 PM  Result Value Ref Range   Troponin I (High Sensitivity) 18 (H) <18 ng/L  Protime-INR (order if Patient is taking Coumadin / Warfarin)     Status: Abnormal   Collection Time: 11/25/19  2:13 PM  Result Value Ref Range   Prothrombin Time 15.3 (H) 11.4 - 15.2 seconds   INR 1.2 0.8 - 1.2  Troponin I (High Sensitivity)     Status: Abnormal   Collection Time: 11/25/19  6:22 PM  Result Value Ref Range   Troponin I (High Sensitivity) 19 (H) <18 ng/L  Lipase, blood     Status: None   Collection Time: 11/25/19  6:55 PM  Result Value Ref Range   Lipase 40 11 - 51 U/L  Brain natriuretic peptide     Status: Abnormal   Collection Time: 11/25/19  6:55 PM  Result Value Ref Range   B Natriuretic Peptide 1,948.2 (H) 0.0 - 100.0 pg/mL  POC SARS Coronavirus 2 Ag-ED - Nasal Swab (BD Veritor Kit)     Status: None   Collection Time: 11/25/19  7:58 PM  Result Value Ref Range   SARS Coronavirus 2 Ag NEGATIVE NEGATIVE  Troponin I (High Sensitivity)     Status: Abnormal   Collection Time: 11/25/19  9:37 PM  Result Value Ref Range   Troponin I (High Sensitivity) 20 (H) <18 ng/L   Dg Chest 2 View  Result Date: 11/25/2019 CLINICAL DATA:  Progressive chest pain and shortness of breath. EXAM: CHEST - 2 VIEW COMPARISON:  06/05/2017 FINDINGS: Stable mild cardiomegaly. CABG. AICD in place. Pulmonary vascularity is  normal. Aortic atherosclerosis. Small new right pleural effusion. Minimal atelectasis at the left lung base laterally. No significant bone abnormality. IMPRESSION: 1. New small right pleural effusion. 2. Minimal atelectasis at the left lung base. 3. Stable mild cardiomegaly. 4. Aortic Atherosclerosis (ICD10-I70.0). Electronically Signed   By: Lorriane Shire M.D.   On: 11/25/2019 13:41     ASSESSMENT: [name] is a 78 y.o. 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   Overall the patient appears to be in decompensated heart failure. He is warm on exam without any clinical signs of shock or end organ dysfunction. The cause of his decompensation is not completely clear. He certainly does not endorse any dietary indiscretion or medication non-adherence. He does not seem to have any signs or symptoms of worsening coronary disease. His ECG is a bit interesting in that in lead I he now has a significant R wave whereas on prior ECGs he has had predominantly negative deflections in lead I reflective of biventricular pacing. The patient has had issues with losing biventricular pacing in the past when he has gone into AF but he appears to be in a normal AV paced rhythm now. His LV lead looks to be in good position on his CXR. In any case it would likely be worth having his device interrogated to ensure he has good LV capture. Loss of BiV pacing would be a very reasonable cause of his decompensation.   PLAN/DISCUSSION: #) Shortness of breath: most likely  due to acute decompensated heart failure; less likely PE or cardiac ischemia; no evidence of acute myocardial injury with troponin on ECG; no evidence of infection. ECG changes possibly suggesting issues with LV lead.  - COVID  swab - thyroid studies (patient on chronic amiodarone and has had borderline hyperthyroid in the past) - IV diuresis  - close monitoring of electrolytes and renal function - last echo nearly 1 year ago, would be reasonable to repeat once patient is closer to euvolemia - recommend pacemaker interrogation to ensure LV lead functioning properly as noted above - per chart review, patient has self-discontinued several medications in the past (spironolactone, BB) that may be very helpful for him in reducing future HF decompensations; would be worth revisiting whether he would be willing to restart any other guideline directed HF therapies; would need close monitoring of his renal function and electrolytes - HF team will follow  #) ischemic CM: - cont aspirin, atorvastatin - ok to continue carvedilol 6.59m BID   AMarcie Mowers MD Cardiology Fellow, PGY-7

## 2019-11-25 NOTE — Telephone Encounter (Signed)
Spoke with patient he is having substernal CP, weakness and SOB with any activity. Home remote transmission reviewed and patient has no episodes of HVR or AMS episodes. Device function WNL. Patient instructed to go to ED for evaluation. Wife given instructions at patient request. Instructed to call [phone] but insisted wife will drive him to [facility].

## 2019-11-25 NOTE — Telephone Encounter (Signed)
Spoke with pt, for the last 4-5 days he reports he has no air. He is SOB with little activity and dressing. He describes orthopnea and has slept in a chair for the last 4 nights. He reports no swelling in extremities or abdomen. His weight is up from 169 to 175 lbs. He is not aware if he is out of rhythm or not. Advised the patient he needed to go to the ER. He would like to send a transmission from his device to check his rhythm. He was advised to take another 40 mg of furosemide now and to take 80 mg twice daily for the next 3 days and to take an extra potassium in the afternoon with the second dose of lasix. Explained to patient I do not feel staying at home is the best thing to do. Message sent to the device clinic to look at transmission for me.

## 2019-11-25 NOTE — Telephone Encounter (Signed)
New Message    Pt c/o Shortness Of Breath: STAT if SOB developed within the last 24 hours or pt is noticeably SOB on the phone  1. Are you currently SOB (can you hear that pt is SOB on the phone)? Yes, Pts wife is calling and says he is having SOB.   2. How long have you been experiencing SOB? Started yesterday and through out the night   3. Are you SOB when sitting or when up moving around? All the time   4. Are you currently experiencing any other symptoms? Pts wife says he may have some swelling in legs

## 2019-11-25 NOTE — ED Notes (Signed)
ED TO INPATIENT HANDOFF REPORT  ED Nurse Name and Phone #: William Hamburger, RN [phone]  S Name/Age/Gender [name] 78 y.o. male Room/Bed: 021C/021C  Code Status   Code Status: Full Code  Home/SNF/Other Home Patient oriented to: self, place, time and situation Is this baseline? No   Triage Complete: Triage complete  Chief Complaint shob  Triage Note Pt here for evaluation of 4-5 days of shortness of breath both at rest and with exertion. Endorses intermittent chest pain. Hx open heart surgery x 4. Denies sick contacts or fevers.    Allergies Allergies  Allergen Reactions  . Atorvastatin Other (See Comments)    Joint pain (patient takes this, however)  . Other Other (See Comments)    Patient is sodium-restricted, per his admission    Level of Care/Admitting Diagnosis ED Disposition    ED Disposition Condition [place]: [place] [[id number]]  Level of Care: Telemetry Cardiac [103]  Covid Evaluation: Asymptomatic Screening Protocol (No Symptoms)  Diagnosis: Acute on chronic combined systolic and diastolic CHF, NYHA class 3 ([place]) [130865]  Admitting Physician: Rise Patience [phone]  Attending Physician: Rise Patience [phone]  Estimated length of stay: past midnight tomorrow  Certification:: I certify this patient will need inpatient services for at least 2 midnights  PT Class (Do Not Modify): Inpatient [101]  PT Acc Code (Do Not Modify): Private [1]       B Medical/Surgery History Past Medical History:  Diagnosis Date  . Anginal pain ([place])   . Anxiety   . Aortic stenosis    a. s/p bioprosthetic AVR in 2010  . Arthritis    gout  . CAD (coronary artery disease)    a. s/p CABG in 1992 b. redo CABG in 2004 c. cath in 2010 showing patent LIMA-LAD, free RIMA-OM, and SVG-OM, and 80% stenosis of small PLA branch  . Chest pain   . CHF (congestive heart failure) ([place])   . Dysrhythmia   . GERD (gastroesophageal  reflux disease)    with gas and bloating probably cause of chest pain  . H/O atrial flutter    a. s/p ablation and not on anticoagulation secondary to GIB and alcohol abuse.   . H/O: gout   . Hyperlipidemia   . Hypertension   . Hypertensive retinopathy    OU  . ICD (implantable cardiac defibrillator) infection, s/p removal of ICD 07/12/2012   re-implanted Medtronic BiV ICD, serial number [id number]   . Ischemic cardiomyopathy    a. EF previously 20-25%, improved to 40-45% by echo in 03/2013 b. 10/2017: echo showing of 30-35% with Grade 2 DD, normal function of AVR, moderate MR.   . Myocardial infarction ([place])   . 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 Shortness of breath    Past Surgical History:  Procedure Laterality Date  . BI-VENTRICULAR IMPLANTABLE CARDIOVERTER DEFIBRILLATOR Right 08/18/2012   Medtronic BiV ICD, serial number [id number] ; Procedure: BI-VENTRICULAR IMPLANTABLE CARDIOVERTER DEFIBRILLATOR  (CRT-D);  Surgeon: Evans Lance, MD;  [facility] CATH LAB;  Service: Cardiovascular;  Laterality: Right;  . BIV ICD GENERATOR CHANGEOUT N/A 09/08/2018   Procedure: BIV ICD GENERATOR CHANGEOUT;  Surgeon: Sanda Klein, MD;  Location: [facility] CV LAB;  Service: Cardiovascular;  Laterality: N/A;  . CARDIAC CATHETERIZATION  Dec 18, 2003   with a presentation of atrial flutter with rapid ventricular response. He had a cath and two stents to  his PDA after his SVG to his RCA. His other graft were patent. He had LV with EF of 30%.   . CARDIOVERSION N/A 05/30/2019   Procedure: CARDIOVERSION;  Surgeon: Sanda Klein, MD;  Location: [facility] ENDOSCOPY;  Service: Cardiovascular;  Laterality: N/A;  . CARDIOVERSION N/A 09/30/2019   Procedure: CARDIOVERSION;  Surgeon: Sanda Klein, MD;  Location: [facility] ENDOSCOPY;  Service: Cardiovascular;  Laterality: N/A;  . CAROTID ENDARTERECTOMY  11/2002   left in December 2003 and known renal artery stenosis of 60-70%   . CATARACT EXTRACTION Bilateral   . CORONARY ARTERY BYPASS GRAFT  1993   redo in 2000, he has had multiple MI previous to both procedures.  [name] EYE SURGERY Bilateral    Cat Sx OU  . IMPLANTABLE CARDIOVERTER DEFIBRILLATOR (ICD) GENERATOR CHANGE N/A 06/25/2012   Procedure: ICD GENERATOR CHANGE;  Surgeon: Evans Lance, MD;  Location: [facility] CATH LAB;  Service: Cardiovascular;  Laterality: N/A;  . VALVE REPLACEMENT  2010     A IV Location/Drains/Wounds Patient Lines/Drains/Airways Status   Active Line/Drains/Airways    Name:   Placement date:   Placement time:   Site:   Days:   Peripheral IV 11/25/19 Right Hand   11/25/19    1819    Hand   less than 1   Incision 07/08/12 Chest Left   07/08/12    1553     2696   Incision 08/18/12 Chest Right   08/18/12    1210     2655   Wound 03/02/13 Laceration Face Right laceration, bleeding controlled   03/02/13    1417    Face   2459          Intake/Output Last 24 hours No intake or output data in the 24 hours ending 11/25/19 2306  Labs/Imaging Results for orders placed or performed during the hospital encounter of 11/25/19 (from the past 48 hour(s))  Basic metabolic panel     Status: Abnormal   Collection Time: 11/25/19  2:13 PM  Result Value Ref Range   Sodium 137 135 - 145 mmol/L   Potassium 4.2 3.5 - 5.1 mmol/L   Chloride 102 98 - 111 mmol/L   CO2 25 22 - 32 mmol/L   Glucose, Bld 95 70 - 99 mg/dL   BUN 28 (H) 8 - 23 mg/dL   Creatinine, Ser 1.55 (H) 0.61 - 1.24 mg/dL   Calcium 9.4 8.9 - 10.3 mg/dL   GFR calc non Af Amer 43 (L) >60 mL/min   GFR calc Af Amer 50 (L) >60 mL/min   Anion gap 10 5 - 15    Comment: Performed at [facility], [address] [place], [place] [address]  CBC     Status: Abnormal   Collection Time: 11/25/19  2:13 PM  Result Value Ref Range   WBC 6.1 4.0 - 10.5 K/uL   RBC 3.44 (L) 4.22 - 5.81 MIL/uL   Hemoglobin 9.2 (L) 13.0 - 17.0 g/dL   HCT 30.1 (L) 39.0 - 52.0 %   MCV 87.5 80.0 - 100.0 fL   MCH 26.7  26.0 - 34.0 pg   MCHC 30.6 30.0 - 36.0 g/dL   RDW 17.2 (H) 11.5 - 15.5 %   Platelets 225 150 - 400 K/uL   nRBC 0.0 0.0 - 0.2 %    Comment: Performed at [facility], [place] [address], [place], [place] [address]  Troponin I (High Sensitivity)     Status: Abnormal   Collection  Time: 11/25/19  2:13 PM  Result Value Ref Range   Troponin I (High Sensitivity) 18 (H) <18 ng/L    Comment: (NOTE) Elevated high sensitivity troponin I (hsTnI) values and significant  changes across serial measurements may suggest ACS but many other  chronic and acute conditions are known to elevate hsTnI results.  Refer to the "Links" section for chest pain algorithms and additional  guidance. Performed at [facility], [place] [address] [place], [place] [address]   Protime-INR (order if Patient is taking Coumadin / Warfarin)     Status: Abnormal   Collection Time: 11/25/19  2:13 PM  Result Value Ref Range   Prothrombin Time 15.3 (H) 11.4 - 15.2 seconds   INR 1.2 0.8 - 1.2    Comment: (NOTE) INR goal varies based on device and disease states. Performed at [facility], [place] [address] [place], [place] [address]   Troponin I (High Sensitivity)     Status: Abnormal   Collection Time: 11/25/19  6:22 PM  Result Value Ref Range   Troponin I (High Sensitivity) 19 (H) <18 ng/L    Comment: (NOTE) Elevated high sensitivity troponin I (hsTnI) values and significant  changes across serial measurements may suggest ACS but many other  chronic and acute conditions are known to elevate hsTnI results.  Refer to the "Links" section for chest pain algorithms and additional  guidance. Performed at [facility], [place] [address] [place], [place] [address]   Lipase, blood     Status: None   Collection Time: 11/25/19  6:55 PM  Result Value Ref Range   Lipase 40 11 - 51 U/L    Comment: Performed at [facility], [place] [address] [place], [place] [address]  Brain natriuretic peptide     Status:  Abnormal   Collection Time: 11/25/19  6:55 PM  Result Value Ref Range   B Natriuretic Peptide 1,948.2 (H) 0.0 - 100.0 pg/mL    Comment: Performed at [facility] [address] [place], [place] [address]  POC SARS Coronavirus 2 Ag-ED - Nasal Swab (BD Veritor Kit)     Status: None   Collection Time: 11/25/19  7:58 PM  Result Value Ref Range   SARS Coronavirus 2 Ag NEGATIVE NEGATIVE    Comment: (NOTE) SARS-CoV-2 antigen NOT DETECTED.  Negative results are presumptive.  Negative results do not preclude SARS-CoV-2 infection and should not be used as the sole basis for treatment or other patient management decisions, including infection  control decisions, particularly in the presence of clinical signs and  symptoms consistent with COVID-19, or in those who have been in contact with the virus.  Negative results must be combined with clinical observations, patient history, and epidemiological information. The expected result is Negative. Fact Sheet for Patients: PodPark.tn Fact Sheet for Healthcare Providers: GiftContent.is This test is not yet approved or cleared by the [place] FDA and  has been authorized for detection and/or diagnosis of SARS-CoV-2 by FDA under an Emergency Use Authorization (EUA).  This EUA will remain in effect (meaning this test can be used) for the duration of  the COVID-19 de claration under Section 564(b)(1) of the Act, 21 U.S.C. section 360bbb-3(b)(1), unless the authorization is terminated or revoked sooner.   Troponin I (High Sensitivity)     Status: Abnormal   Collection Time: 11/25/19  9:37 PM  Result Value Ref Range   Troponin I (High Sensitivity) 20 (H) <18 ng/L  Comment: (NOTE) Elevated high sensitivity troponin I (hsTnI) values and significant  changes across serial measurements may suggest ACS but many other  chronic and acute conditions are known to elevate hsTnI results.   Refer to the "Links" section for chest pain algorithms and additional  guidance. Performed at [facility], [place] [address], [place], [place] [address]    Dg Chest 2 View  Result Date: 11/25/2019 CLINICAL DATA:  Progressive chest pain and shortness of breath. EXAM: CHEST - 2 VIEW COMPARISON:  06/05/2017 FINDINGS: Stable mild cardiomegaly. CABG. AICD in place. Pulmonary vascularity is normal. Aortic atherosclerosis. Small new right pleural effusion. Minimal atelectasis at the left lung base laterally. No significant bone abnormality. IMPRESSION: 1. New small right pleural effusion. 2. Minimal atelectasis at the left lung base. 3. Stable mild cardiomegaly. 4. Aortic Atherosclerosis (ICD10-I70.0). Electronically Signed   By: Lorriane Shire M.D.   On: 11/25/2019 13:41    Pending Labs Unresulted Labs (From admission, onward)    Start     Ordered   12/02/19 0500  Creatinine, serum  (enoxaparin (LOVENOX)    CrCl >/= 30 ml/min)  Weekly,   R    Comments: while on enoxaparin therapy    11/25/19 2226   11/26/19 5631  Basic metabolic panel  Tomorrow morning,   R     11/25/19 2226   11/26/19 0500  CBC  Tomorrow morning,   R     11/25/19 2226   11/26/19 0500  TSH  Tomorrow morning,   R     11/25/19 2226   11/25/19 2224  CBC  (enoxaparin (LOVENOX)    CrCl >/= 30 ml/min)  Once,   STAT    Comments: Baseline for enoxaparin therapy IF NOT ALREADY DRAWN.  Notify MD if PLT < 100 K.    11/25/19 2226   11/25/19 2224  Creatinine, serum  (enoxaparin (LOVENOX)    CrCl >/= 30 ml/min)  Once,   STAT    Comments: Baseline for enoxaparin therapy IF NOT ALREADY DRAWN.    11/25/19 2226   11/25/19 2211  SARS CORONAVIRUS 2 (TAT 6-24 HRS) Nasopharyngeal Nasopharyngeal Swab  (Asymptomatic/Tier 3)  ONCE - STAT,   STAT    Question Answer Comment  Is this test for diagnosis or screening Screening   Symptomatic for COVID-19 as defined by CDC No   Hospitalized for COVID-19 No   Admitted to ICU for COVID-19 No    Previously tested for COVID-19 Yes   Resident in a congregate (group) care setting No   Employed in healthcare setting No      11/25/19 2210   11/25/19 1914  D-dimer, quantitative (not at [facility])  Once,   STAT     11/25/19 1913          Vitals/Pain Today's Vitals   11/25/19 2045 11/25/19 2100 11/25/19 2115 11/25/19 2130  BP: 138/86 135/67 (!) 146/82 133/83  Pulse: (!) 58 65    Resp: (!) 22 (!) 21 (!) 24 18  Temp:      TempSrc:      SpO2: 97% 95%    PainSc:        Isolation Precautions Airborne and Contact precautions  Medications Medications  aspirin EC tablet 81 mg (has no administration in time range)  amiodarone (PACERONE) tablet 200 mg (has no administration in time range)  atorvastatin (LIPITOR) tablet 10 mg (has no administration in time range)  carvedilol (COREG) tablet 6.25 mg (has no administration in time range)  nitroGLYCERIN (  NITROSTAT) SL tablet 0.4 mg (has no administration in time range)  ferrous sulfate EC tablet 325 mg (has no administration in time range)  folic acid (FOLVITE) tablet 1 mg (has no administration in time range)  potassium chloride SA (KLOR-CON) CR tablet 20 mEq (has no administration in time range)  atropine 1 % ophthalmic solution 1 drop (has no administration in time range)  brimonidine (ALPHAGAN) 0.2 % ophthalmic solution 1 drop (has no administration in time range)  dorzolamide-timolol (COSOPT) 22.3-6.8 MG/ML ophthalmic solution 1 drop (has no administration in time range)  prednisoLONE acetate (PRED FORTE) 1 % ophthalmic suspension 1 drop (has no administration in time range)  acetaminophen (TYLENOL) tablet 650 mg (has no administration in time range)    Or  acetaminophen (TYLENOL) suppository 650 mg (has no administration in time range)  enoxaparin (LOVENOX) injection 40 mg (has no administration in time range)  furosemide (LASIX) injection 40 mg (40 mg Intravenous Given 11/25/19 2136)    Mobility walks Low fall risk   Focused  Assessments Cardiac Assessment Handoff:    Lab Results  Component Value Date   CKTOTAL 69 03/06/2018   CKMB 1.1 07/24/2009   TROPONINI 0.04 (HH) 03/07/2018   Lab Results  Component Value Date   DDIMER 1.07 (H) 06/05/2017   Does the Patient currently have chest pain? No  , Pulmonary Assessment Handoff:  Lung sounds:   O2 Device: Room Air        R Recommendations: See Admitting Provider Note  Report given to:   Additional Notes:

## 2019-11-26 ENCOUNTER — Inpatient Hospital Stay (HOSPITAL_COMMUNITY): Payer: Medicare Other

## 2019-11-26 DIAGNOSIS — I10 Essential (primary) hypertension: Secondary | ICD-10-CM

## 2019-11-26 DIAGNOSIS — I5043 Acute on chronic combined systolic (congestive) and diastolic (congestive) heart failure: Secondary | ICD-10-CM

## 2019-11-26 DIAGNOSIS — N1832 Chronic kidney disease, stage 3b: Secondary | ICD-10-CM

## 2019-11-26 DIAGNOSIS — Z952 Presence of prosthetic heart valve: Secondary | ICD-10-CM

## 2019-11-26 DIAGNOSIS — I340 Nonrheumatic mitral (valve) insufficiency: Secondary | ICD-10-CM

## 2019-11-26 DIAGNOSIS — Z4502 Encounter for adjustment and management of automatic implantable cardiac defibrillator: Secondary | ICD-10-CM

## 2019-11-26 DIAGNOSIS — I480 Paroxysmal atrial fibrillation: Secondary | ICD-10-CM

## 2019-11-26 LAB — CREATININE, SERUM
Creatinine, Ser: 1.48 mg/dL — ABNORMAL HIGH (ref 0.61–1.24)
GFR calc Af Amer: 53 mL/min — ABNORMAL LOW (ref >60)
GFR calc non Af Amer: 45 mL/min — ABNORMAL LOW (ref >60)

## 2019-11-26 LAB — CBC
HCT: 29.2 % — ABNORMAL LOW (ref 39.0–52.0)
Hemoglobin: 8.8 g/dL — ABNORMAL LOW (ref 13.0–17.0)
MCH: 26.4 pg (ref 26.0–34.0)
MCHC: 30.1 g/dL (ref 30.0–36.0)
MCV: 87.7 fL (ref 80.0–100.0)
Platelets: 207 10*3/uL (ref 150–400)
RBC: 3.33 MIL/uL — ABNORMAL LOW (ref 4.22–5.81)
RDW: 17.4 % — ABNORMAL HIGH (ref 11.5–15.5)
WBC: 7.5 10*3/uL (ref 4.0–10.5)
nRBC: 0 % (ref 0.0–0.2)

## 2019-11-26 LAB — BASIC METABOLIC PANEL
Anion gap: 13 (ref 5–15)
BUN: 26 mg/dL — ABNORMAL HIGH (ref 8–23)
CO2: 24 mmol/L (ref 22–32)
Calcium: 9.1 mg/dL (ref 8.9–10.3)
Chloride: 101 mmol/L (ref 98–111)
Creatinine, Ser: 1.46 mg/dL — ABNORMAL HIGH (ref 0.61–1.24)
GFR calc Af Amer: 53 mL/min — ABNORMAL LOW (ref >60)
GFR calc non Af Amer: 46 mL/min — ABNORMAL LOW (ref >60)
Glucose, Bld: 80 mg/dL (ref 70–99)
Potassium: 3.5 mmol/L (ref 3.5–5.1)
Sodium: 138 mmol/L (ref 135–145)

## 2019-11-26 LAB — D-DIMER, QUANTITATIVE: D-Dimer, Quant: 1.79 ug/mL-FEU — ABNORMAL HIGH (ref 0.00–0.50)

## 2019-11-26 LAB — ECHOCARDIOGRAM COMPLETE
Height: 73 in
Weight: 2864.22 oz

## 2019-11-26 LAB — SARS CORONAVIRUS 2 (TAT 6-24 HRS): SARS Coronavirus 2: NEGATIVE

## 2019-11-26 LAB — T4, FREE: Free T4: 1.28 ng/dL — ABNORMAL HIGH (ref 0.61–1.12)

## 2019-11-26 LAB — MRSA PCR SCREENING: MRSA by PCR: NEGATIVE

## 2019-11-26 LAB — TROPONIN I (HIGH SENSITIVITY): Troponin I (High Sensitivity): 18 ng/L — ABNORMAL HIGH (ref <18)

## 2019-11-26 LAB — TSH: TSH: 6.858 u[IU]/mL — ABNORMAL HIGH (ref 0.350–4.500)

## 2019-11-26 MED ORDER — SENNOSIDES-DOCUSATE SODIUM 8.6-50 MG PO TABS: 2.0000 | ORAL_TABLET | Freq: Every evening | ORAL | Status: DC | PRN

## 2019-11-26 MED ORDER — FUROSEMIDE 10 MG/ML IJ SOLN
40.0000 mg | Freq: Two times a day (BID) | INTRAMUSCULAR | Status: DC
  Administered 2019-11-26: 40 mg via INTRAVENOUS
  Filled 2019-11-26: qty 4

## 2019-11-26 MED ORDER — FUROSEMIDE 10 MG/ML IJ SOLN
40.0000 mg | Freq: Four times a day (QID) | INTRAMUSCULAR | Status: DC
  Administered 2019-11-26 – 2019-11-27 (×4): 40 mg via INTRAVENOUS
  Filled 2019-11-26 (×4): qty 4

## 2019-11-26 MED ORDER — POLYETHYLENE GLYCOL 3350 17 G PO PACK
17.0000 g | PACK | Freq: Every day | ORAL | Status: DC | PRN
  Administered 2019-11-26: 17 g via ORAL
  Filled 2019-11-26: qty 1

## 2019-11-26 MED ORDER — POTASSIUM CHLORIDE CRYS ER 20 MEQ PO TBCR
40.0000 meq | EXTENDED_RELEASE_TABLET | Freq: Once | ORAL | Status: AC
  Administered 2019-11-26: 40 meq via ORAL
  Filled 2019-11-26: qty 2

## 2019-11-26 MED ORDER — SPIRONOLACTONE 12.5 MG HALF TABLET
12.5000 mg | ORAL_TABLET | Freq: Every day | ORAL | Status: DC
  Administered 2019-11-26 – 2019-11-27 (×2): 12.5 mg via ORAL
  Filled 2019-11-26 (×2): qty 1

## 2019-11-26 NOTE — ED Notes (Signed)
Attempted to call report; was advised that nurse is "tied up with a patient". Also told that nurse will call me back.

## 2019-11-26 NOTE — Plan of Care (Signed)

## 2019-11-26 NOTE — Progress Notes (Addendum)
Progress Note  Patient Name: [name] Date of Encounter: 11/26/2019  Primary Cardiologist: Sanda Klein, MD   Subjective   The patent feels better, more comfortable laying at 30 degrees.  Inpatient Medications    Scheduled Meds: . amiodarone  200 mg Oral Daily  . aspirin EC  81 mg Oral Daily  . atorvastatin  10 mg Oral Daily  . atropine  1 drop Right Eye BID  . brimonidine  1 drop Right Eye BID  . carvedilol  6.25 mg Oral BID WC  . dorzolamide-timolol  1 drop Right Eye BID  . enoxaparin (LOVENOX) injection  40 mg Subcutaneous Q24H  . ferrous sulfate  325 mg Oral BID WC  . folic acid  1 mg Oral Daily  . furosemide  40 mg Intravenous BID  . prednisoLONE acetate  1 drop Right Eye TID AC & HS   Continuous Infusions:  PRN Meds: acetaminophen **OR** acetaminophen, nitroGLYCERIN, polyethylene glycol, senna-docusate   Vital Signs    Vitals:   11/26/19 0430 11/26/19 0515 11/26/19 0613 11/26/19 0754  BP: 137/73 125/73 139/75 136/77  Pulse: 61 60 61 72  Resp: 18 17 16 18   Temp:   98.7 F (37.1 C) 98.5 F (36.9 C)  TempSrc:   Oral Oral  SpO2: 100% 100% 98% 98%  Weight:   81.2 kg   Height:   6\' 1"  (1.854 m)     Intake/Output Summary (Last 24 hours) at 11/26/2019 1008 Last data filed at 11/26/2019 0600 Gross per 24 hour  Intake 120 ml  Output 1150 ml  Net -1030 ml   Last 3 Weights 11/26/2019 11/08/2019 10/12/2019  Weight (lbs) 179 lb 0.2 oz 176 lb 174 lb  Weight (kg) 81.2 kg 79.833 kg 78.926 kg     Telemetry    AV sequential pacing 7'' - Personally Reviewed  ECG    No new tracing - Personally Reviewed  Physical Exam  NAD GEN: No acute distress.   Neck: JVDs up to jaws Cardiac: RRR, no murmurs, rubs, or gallops.  Respiratory: No BS at the right base, crackles at the left base. GI: Soft, nontender, non-distended  MS: No edema; No deformity. Neuro:  Nonfocal  Psych: Normal affect   Labs    High Sensitivity Troponin:   Recent Labs  Lab  11/25/19 1413 11/25/19 1822 11/25/19 2137 11/26/19 0733  TROPONINIHS 18* 19* 20* 18*     Chemistry Recent Labs  Lab 11/25/19 1413 11/26/19 0434 11/26/19 0733  NA 137 138  --   K 4.2 3.5  --   CL 102 101  --   CO2 25 24  --   GLUCOSE 95 80  --   BUN 28* 26*  --   CREATININE 1.55* 1.46* 1.48*  CALCIUM 9.4 9.1  --   GFRNONAA 43* 46* 45*  GFRAA 50* 53* 53*  ANIONGAP 10 13  --      Hematology Recent Labs  Lab 11/25/19 1413 11/26/19 0434  WBC 6.1 7.5  RBC 3.44* 3.33*  HGB 9.2* 8.8*  HCT 30.1* 29.2*  MCV 87.5 87.7  MCH 26.7 26.4  MCHC 30.6 30.1  RDW 17.2* 17.4*  PLT 225 207   BNP Recent Labs  Lab 11/25/19 1855  BNP 1,948.2*     DDimer  Recent Labs  Lab 11/26/19 0733  DDIMER 1.79*     Radiology    Dg Chest 2 View  Result Date: 11/25/2019 CLINICAL DATA:  Progressive chest pain and shortness of breath.  EXAM: CHEST - 2 VIEW COMPARISON:  06/05/2017 FINDINGS: Stable mild cardiomegaly. CABG. AICD in place. Pulmonary vascularity is normal. Aortic atherosclerosis. Small new right pleural effusion. Minimal atelectasis at the left lung base laterally. No significant bone abnormality. IMPRESSION: 1. New small right pleural effusion. 2. Minimal atelectasis at the left lung base. 3. Stable mild cardiomegaly. 4. Aortic Atherosclerosis (ICD10-I70.0). Electronically Signed   By: Francene Boyers M.D.   On: 11/25/2019 13:41   Cardiac Studies   Repeat echo is pending  Patient Profile     78 y.o. 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 Admitted with 2 weeks of worsening exertional dyspnea. He has a long history of heart failure and has had multiple exacerbations over the years. Typically his weight is around 168 lbs however he states he  has gained about 10 lbs over the past few weeks. No ICD firing episodes. He denies palpitations, syncope or presyncope.   In the ED the patient was found to have a small new right sided pleural effusion and minimal atelectasis at the left lung base. His BNP was elevated to 2,000 and his creatinine was stable at 1.55. His troponin was minimally elevated and stable at 20.   Assessment & Plan    1. Acute on chronic combined systolic and diastolic CHF 2. Ischemic cardiomyopathy, S/P CRT D 3. CAD, s/p CABG and AVR 2004 4. PAF not on AC sec GIB  Negative diuresis 1.3 L overnight with some iprovement, still 11 lbs above the baseline, Crea 1.48, baseline 1.5, I will increase lasix to 40 mg iv Q6H and follow closely, I will add spironolactone 12.5 mg po daily. Replace K. In SR.  For questions or updates, please contact [facility] Please consult [email or web address] for contact info under     Signed, Tobias Alexander, MD  11/26/2019, 10:08 AM

## 2019-11-26 NOTE — Progress Notes (Signed)
PROGRESS NOTE    [name]  EXB:[id number] DOB: November 24, 1941 DOA: 11/25/2019 PCP: Patient, No Pcp Per   Brief Narrative:   78 year old with history of CAD status post CABG, bioprosthetic aortic valve replacement, atrial fibrillation not on anticoagulation secondary to GI bleed, systolic CHF EF 30% status post CRT, bilateral carotid stenosis status post bilateral CEA, CKD stage III came to the ER with worsening shortness of breath orthopnea.  Chest x-ray showed pleural effusion, COVID-19-negative.  Elevated BNP diagnosed with CHF exacerbation admitted for diuresis.  Assessment & Plan:   Principal Problem:   Acute on chronic combined systolic and diastolic CHF, NYHA class 3 (HCC) Active Problems:   Essential hypertension   Peripheral vascular disease (HCC)   CKD (chronic kidney disease) stage 3, GFR 30-59 ml/min   CORONARY ARTERY BYPASS GRAFT, HX OF   H/O aortic valve replacement   PAF (paroxysmal atrial fibrillation) (HCC)   SA node dysfunction (HCC)   Ascending aortic aneurysm (HCC)   ICD (implantable cardioverter-defibrillator) battery depletion  Acute on chronic congestive heart failure with reduced ejection fraction 30%, grade 3 DD Elevated troponin, mild demand ischemia -Continue diuretics with IV Lasix 40 mg every 12 hours.  Strict input and output.  Daily weight -Echocardiogram ordered-pending -Cardiology following-pacemaker interrogation -Monitor electrolytes.  Continue Coreg, not on ARB/ACEI secondary to renal failure -Elevated TSH, check free T4  History of coronary artery disease status post CABG Elevated troponin Bioprosthetic aortic valve -Minimal chest discomfort suspect from fluid overload.  Troponin very mildly elevated.  On aspirin, statin and Coreg -Cardiology following  History of atrial fibrillation, paroxysmal -Noted anticoagulation due to history of GI bleed -Continue amiodarone and Coreg  CKD stage III -Baseline creatinine 1.4.  Continue to  monitor creatinine levels in the setting of aggressive diuresis  Carotid artery disease status post bilateral CEA -Already on aspirin and statin    DVT prophylaxis: Lovenox Code Status: Full code Family Communication: None Disposition Plan: Maintain inpatient stay for IV diuresis.  Consultants:   Cardiology  Procedures:   None  Antimicrobials:   None   Subjective: Chest pain resolved but still has exertional dyspnea with minimal movement.  Review of Systems Otherwise negative except as per HPI, including: General: Denies fever, chills, night sweats or unintended weight loss. Resp: Denies cough, wheezing Cardiac: Denies chest pain, palpitations, orthopnea, paroxysmal nocturnal dyspnea. GI: Denies abdominal pain, nausea, vomiting, diarrhea or constipation GU: Denies dysuria, frequency, hesitancy or incontinence MS: Denies muscle aches, joint pain or swelling Neuro: Denies headache, neurologic deficits (focal weakness, numbness, tingling), abnormal gait Psych: Denies anxiety, depression, SI/HI/AVH Skin: Denies new rashes or lesions ID: Denies sick contacts, exotic exposures, travel  Objective: Vitals:   11/26/19 0130 11/26/19 0430 11/26/19 0515 11/26/19 0613  BP: (!) 139/96 137/73 125/73 139/75  Pulse:  61 60 61  Resp: 19 18 17 16   Temp:    98.7 F (37.1 C)  TempSrc:    Oral  SpO2:  100% 100% 98%  Weight:    81.2 kg  Height:    6\' 1"  (1.854 m)    Intake/Output Summary (Last 24 hours) at 11/26/2019 0745 Last data filed at 11/26/2019 0600 Gross per 24 hour  Intake 120 ml  Output 1150 ml  Net -1030 ml   Filed Weights   11/26/19 0613  Weight: 81.2 kg    Examination:  General exam: Appears calm and comfortable  Respiratory system: Minimal bibasilar crackles Cardiovascular system: S1 & S2 heard, RRR. No JVD, murmurs, rubs, gallops  or clicks. No pedal edema. Gastrointestinal system: Abdomen is nondistended, soft and nontender. No organomegaly or masses  felt. Normal bowel sounds heard. Central nervous system: Alert and oriented. No focal neurological deficits. Extremities: Symmetric 5 x 5 power. Skin: No rashes, lesions or ulcers Psychiatry: Judgement and insight appear normal. Mood & affect appropriate.     Data Reviewed:   CBC: Recent Labs  Lab 11/25/19 1413 11/26/19 0434  WBC 6.1 7.5  HGB 9.2* 8.8*  HCT 30.1* 29.2*  MCV 87.5 87.7  PLT 225 637   Basic Metabolic Panel: Recent Labs  Lab 11/25/19 1413 11/26/19 0434  NA 137 138  K 4.2 3.5  CL 102 101  CO2 25 24  GLUCOSE 95 80  BUN 28* 26*  CREATININE 1.55* 1.46*  CALCIUM 9.4 9.1   GFR: Estimated Creatinine Clearance: 48.6 mL/min (A) (by C-G formula based on SCr of 1.46 mg/dL (H)). Liver Function Tests: No results for input(s): AST, ALT, ALKPHOS, BILITOT, PROT, ALBUMIN in the last 168 hours. Recent Labs  Lab 11/25/19 1855  LIPASE 40   No results for input(s): AMMONIA in the last 168 hours. Coagulation Profile: Recent Labs  Lab 11/25/19 1413  INR 1.2   Cardiac Enzymes: No results for input(s): CKTOTAL, CKMB, CKMBINDEX, TROPONINI in the last 168 hours. BNP (last 3 results) No results for input(s): PROBNP in the last 8760 hours. HbA1C: No results for input(s): HGBA1C in the last 72 hours. CBG: No results for input(s): GLUCAP in the last 168 hours. Lipid Profile: No results for input(s): CHOL, HDL, LDLCALC, TRIG, CHOLHDL, LDLDIRECT in the last 72 hours. Thyroid Function Tests: Recent Labs    11/26/19 0434  TSH 6.858*   Anemia Panel: No results for input(s): VITAMINB12, FOLATE, FERRITIN, TIBC, IRON, RETICCTPCT in the last 72 hours. Sepsis Labs: No results for input(s): PROCALCITON, LATICACIDVEN in the last 168 hours.  Recent Results (from the past 240 hour(s))  SARS CORONAVIRUS 2 (TAT 6-24 HRS) Nasopharyngeal Nasopharyngeal Swab     Status: None   Collection Time: 11/25/19 10:11 PM   Specimen: Nasopharyngeal Swab  Result Value Ref Range Status    SARS Coronavirus 2 NEGATIVE NEGATIVE Final    Comment: (NOTE) SARS-CoV-2 target nucleic acids are NOT DETECTED. The SARS-CoV-2 RNA is generally detectable in upper and lower respiratory specimens during the acute phase of infection. Negative results do not preclude SARS-CoV-2 infection, do not rule out co-infections with other pathogens, and should not be used as the sole basis for treatment or other patient management decisions. Negative results must be combined with clinical observations, patient history, and epidemiological information. The expected result is Negative. Fact Sheet for Patients: SugarRoll.be Fact Sheet for Healthcare Providers: https://www.woods-mathews.com/ This test is not yet approved or cleared by the [place] FDA and  has been authorized for detection and/or diagnosis of SARS-CoV-2 by FDA under an Emergency Use Authorization (EUA). This EUA will remain  in effect (meaning this test can be used) for the duration of the COVID-19 declaration under Section 56 4(b)(1) of the Act, 21 U.S.C. section 360bbb-3(b)(1), unless the authorization is terminated or revoked sooner. Performed at [facility], [place] [address], [place], [place] [address]          Radiology Studies: Dg Chest 2 View  Result Date: 11/25/2019 CLINICAL DATA:  Progressive chest pain and shortness of breath. EXAM: CHEST - 2 VIEW COMPARISON:  06/05/2017 FINDINGS: Stable mild cardiomegaly. CABG. AICD in place. Pulmonary vascularity is normal. Aortic atherosclerosis. Small new right  pleural effusion. Minimal atelectasis at the left lung base laterally. No significant bone abnormality. IMPRESSION: 1. New small right pleural effusion. 2. Minimal atelectasis at the left lung base. 3. Stable mild cardiomegaly. 4. Aortic Atherosclerosis (ICD10-I70.0). Electronically Signed   By: Francene BoyersJames  Maxwell M.D.   On: 11/25/2019 13:41        Scheduled Meds: .  amiodarone  200 mg Oral Daily  . aspirin EC  81 mg Oral Daily  . atorvastatin  10 mg Oral Daily  . atropine  1 drop Right Eye BID  . brimonidine  1 drop Right Eye BID  . carvedilol  6.25 mg Oral BID WC  . dorzolamide-timolol  1 drop Right Eye BID  . enoxaparin (LOVENOX) injection  40 mg Subcutaneous Q24H  . ferrous sulfate  325 mg Oral BID WC  . folic acid  1 mg Oral Daily  . furosemide  40 mg Intravenous BID  . potassium chloride SA  20 mEq Oral Daily  . prednisoLONE acetate  1 drop Right Eye TID AC & HS   Continuous Infusions:   LOS: 1 day   Time spent= 35 mins    Ankit Joline Maxcyhirag Amin, MD [facility]  If 7PM-7AM, please contact night-coverage  11/26/2019, 7:45 AM

## 2019-11-26 NOTE — Progress Notes (Signed)
Attempted echo.  Patient in bathroom and will be a while per convo w/ nurse.  Will attempt later

## 2019-11-26 NOTE — Progress Notes (Signed)
  Echocardiogram 2D Echocardiogram has been performed.  [name] 11/26/2019, 11:23 AM

## 2019-11-27 DIAGNOSIS — I509 Heart failure, unspecified: Secondary | ICD-10-CM

## 2019-11-27 LAB — BASIC METABOLIC PANEL
Anion gap: 12 (ref 5–15)
BUN: 29 mg/dL — ABNORMAL HIGH (ref 8–23)
CO2: 25 mmol/L (ref 22–32)
Calcium: 8.9 mg/dL (ref 8.9–10.3)
Chloride: 101 mmol/L (ref 98–111)
Creatinine, Ser: 1.57 mg/dL — ABNORMAL HIGH (ref 0.61–1.24)
GFR calc Af Amer: 49 mL/min — ABNORMAL LOW (ref >60)
GFR calc non Af Amer: 42 mL/min — ABNORMAL LOW (ref >60)
Glucose, Bld: 89 mg/dL (ref 70–99)
Potassium: 3.4 mmol/L — ABNORMAL LOW (ref 3.5–5.1)
Sodium: 138 mmol/L (ref 135–145)

## 2019-11-27 LAB — MAGNESIUM: Magnesium: 1.9 mg/dL (ref 1.7–2.4)

## 2019-11-27 MED ORDER — POTASSIUM CHLORIDE CRYS ER 20 MEQ PO TBCR
40.0000 meq | EXTENDED_RELEASE_TABLET | Freq: Once | ORAL | Status: AC
  Administered 2019-11-27: 40 meq via ORAL
  Filled 2019-11-27: qty 2

## 2019-11-27 MED ORDER — POTASSIUM CHLORIDE CRYS ER 20 MEQ PO TBCR
40.0000 meq | EXTENDED_RELEASE_TABLET | ORAL | Status: AC
  Administered 2019-11-27 (×2): 40 meq via ORAL
  Filled 2019-11-27 (×2): qty 2

## 2019-11-27 MED ORDER — FUROSEMIDE 40 MG PO TABS
40.0000 mg | ORAL_TABLET | Freq: Two times a day (BID) | ORAL | Status: DC
  Administered 2019-11-27: 40 mg via ORAL
  Filled 2019-11-27: qty 1

## 2019-11-27 NOTE — Progress Notes (Signed)
Progress Note  Patient Name: [name] Date of Encounter: 11/27/2019  Primary Cardiologist: Sanda Klein, MD   Subjective   The patent feels better, almost back to baseline.  Inpatient Medications    Scheduled Meds: . amiodarone  200 mg Oral Daily  . aspirin EC  81 mg Oral Daily  . atorvastatin  10 mg Oral Daily  . atropine  1 drop Right Eye BID  . brimonidine  1 drop Right Eye BID  . carvedilol  6.25 mg Oral BID WC  . dorzolamide-timolol  1 drop Right Eye BID  . enoxaparin (LOVENOX) injection  40 mg Subcutaneous Q24H  . ferrous sulfate  325 mg Oral BID WC  . folic acid  1 mg Oral Daily  . furosemide  40 mg Intravenous Q6H  . potassium chloride  40 mEq Oral Q4H  . prednisoLONE acetate  1 drop Right Eye TID AC & HS  . spironolactone  12.5 mg Oral Daily   Continuous Infusions:  PRN Meds: acetaminophen **OR** acetaminophen, nitroGLYCERIN, polyethylene glycol, senna-docusate   Vital Signs    Vitals:   11/26/19 1942 11/26/19 2312 11/27/19 0313 11/27/19 0748  BP: 117/60 119/70 127/68 134/62  Pulse: 60 60 68 63  Resp: 18 16 20 19   Temp: 99.1 F (37.3 C) 98.7 F (37.1 C) 98.4 F (36.9 C) 98.4 F (36.9 C)  TempSrc: Oral Oral Oral Oral  SpO2: 98% 98% 95% 100%  Weight:   77.8 kg   Height:        Intake/Output Summary (Last 24 hours) at 11/27/2019 0957 Last data filed at 11/27/2019 0753 Gross per 24 hour  Intake 640 ml  Output 3575 ml  Net -2935 ml   Last 3 Weights 11/27/2019 11/26/2019 11/08/2019  Weight (lbs) 171 lb 8.3 oz 179 lb 0.2 oz 176 lb  Weight (kg) 77.8 kg 81.2 kg 79.833 kg     Telemetry    AV sequential pacing 47'' - Personally Reviewed  ECG    No new tracing - Personally Reviewed  Physical Exam  NAD GEN: No acute distress.   Neck: JVDs up to jaws Cardiac: RRR, no murmurs, rubs, or gallops.  Respiratory: CTA B/L. GI: Soft, nontender, non-distended  MS: No edema; No deformity. Neuro:  Nonfocal  Psych: Normal affect   Labs     High Sensitivity Troponin:   Recent Labs  Lab 11/25/19 1413 11/25/19 1822 11/25/19 2137 11/26/19 0733  TROPONINIHS 18* 19* 20* 18*     Chemistry Recent Labs  Lab 11/25/19 1413 11/26/19 0434 11/26/19 0733 11/27/19 0231  NA 137 138  --  138  K 4.2 3.5  --  3.4*  CL 102 101  --  101  CO2 25 24  --  25  GLUCOSE 95 80  --  89  BUN 28* 26*  --  29*  CREATININE 1.55* 1.46* 1.48* 1.57*  CALCIUM 9.4 9.1  --  8.9  GFRNONAA 43* 46* 45* 42*  GFRAA 50* 53* 53* 49*  ANIONGAP 10 13  --  12    Hematology Recent Labs  Lab 11/25/19 1413 11/26/19 0434  WBC 6.1 7.5  RBC 3.44* 3.33*  HGB 9.2* 8.8*  HCT 30.1* 29.2*  MCV 87.5 87.7  MCH 26.7 26.4  MCHC 30.6 30.1  RDW 17.2* 17.4*  PLT 225 207   BNP Recent Labs  Lab 11/25/19 1855  BNP 1,948.2*     DDimer  Recent Labs  Lab 11/26/19 0733  DDIMER 1.79*  Radiology    Dg Chest 2 View  Result Date: 11/25/2019 CLINICAL DATA:  Progressive chest pain and shortness of breath. EXAM: CHEST - 2 VIEW COMPARISON:  06/05/2017 FINDINGS: Stable mild cardiomegaly. CABG. AICD in place. Pulmonary vascularity is normal. Aortic atherosclerosis. Small new right pleural effusion. Minimal atelectasis at the left lung base laterally. No significant bone abnormality. IMPRESSION: 1. New small right pleural effusion. 2. Minimal atelectasis at the left lung base. 3. Stable mild cardiomegaly. 4. Aortic Atherosclerosis (ICD10-I70.0). Electronically Signed   By: Francene BoyersJames  Maxwell M.D.   On: 11/25/2019 13:41   Cardiac Studies   TTE: 11/26/2019  1. Left ventricular ejection fraction, by visual estimation, is <20%. The left ventricle has severely decreased function. There is moderately increased left ventricular hypertrophy.  2. Elevated left atrial and left ventricular end-diastolic pressures.  3. Left ventricular diastolic parameters are consistent with Grade III diastolic dysfunction (restrictive).  4. Severely dilated left ventricular internal cavity  size.  5. Global right ventricle has moderately reduced systolic function.The right ventricular size is moderately enlarged. No increase in right ventricular wall thickness.  6. Left atrial size was severely dilated.  7. Right atrial size was normal.  8. The mitral valve is normal in structure. Moderate mitral valve regurgitation. No evidence of mitral stenosis.  9. The tricuspid valve is normal in structure. Tricuspid valve regurgitation is not demonstrated. 10. Aortic valve regurgitation is not visualized. No evidence of aortic valve sclerosis or stenosis. 11. Normal gradients across the bioprosthetic valve. 12. The pulmonic valve was normal in structure. Pulmonic valve regurgitation is not visualized. 13. Aneurysm of the ascending aorta, measuring 53 mm. 14. Aortic dilatation noted. 15. Moderately elevated pulmonary artery systolic pressure. 16. The tricuspid regurgitant velocity is 2.50 m/s, and with an assumed right atrial pressure of 15 mmHg, the estimated right ventricular systolic pressure is moderately elevated at 40.0 mmHg. 17. The inferior vena cava is normal in size with greater than 50% respiratory variability, suggesting right atrial pressure of 3 mmHg. 18. There is ascending aortic aneurysm measuring 53 mm (51 mm on the chest CTA on 03/07/2019). 19. When compared to the prior study from 03/07/2018 LVEF has decreased from 30-35% to 15-20%. RVEF is moderately decreased. There are normal transoartic gradients. Moderate mitral regurgitation.   Patient Profile     78 y.o. 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 Admitted with 2 weeks of worsening exertional dyspnea. He has a long history of heart failure and has had multiple exacerbations over the  years. Typically his weight is around 168 lbs however he states he has gained about 10 lbs over the past few weeks. No ICD firing episodes. He denies palpitations, syncope or presyncope.   In the ED the patient was found to have a small new right sided pleural effusion and minimal atelectasis at the left lung base. His BNP was elevated to 2,000 and his creatinine was stable at 1.55. His troponin was minimally elevated and stable at 20.   Assessment & Plan    1. Acute on chronic combined systolic and diastolic CHF 2. Ischemic cardiomyopathy, S/P CRT D 3. CAD, s/p CABG and AVR 2004 4. PAF not on AC sec GIB  Negative diuresis  2.9 L overnight, he feels almost back  to baseline, Crea slightly up, I will switch lasix to PO 40 mg BID and continue spironolactone 12.5 mg po daily that is new this admission. Echo yesterday showed that his LVEF has decreased from 30-35% to 15-20%. RVEF is moderately decreased. MR is moderate, previously mild.  I would plan for a discharge tomorrow, he will need an early follow up with labs. Low dose Entresto should be considered if his Crea improves.   For questions or updates, please contact [facility] Please consult [email or web address] for contact info under     Signed, Tobias Alexander, MD  11/27/2019, 9:57 AM

## 2019-11-27 NOTE — Progress Notes (Signed)
PROGRESS NOTE    MURICE BARBAR  YIR:[id number] DOB: [birth year] DOA: 11/25/2019 PCP: Patient, No Pcp Per   Brief Narrative:   78 year old with history of CAD status post CABG, bioprosthetic aortic valve replacement, atrial fibrillation not on anticoagulation secondary to GI bleed, systolic CHF EF 30% status post CRT, bilateral carotid stenosis status post bilateral CEA, CKD stage III came to the ER with worsening shortness of breath orthopnea.  Chest x-ray showed pleural effusion, COVID-19-negative.  Elevated BNP diagnosed with CHF exacerbation admitted for diuresis.  New echocardiogram showed ejection fraction less than 20%.  Assessment & Plan:   Principal Problem:   Acute on chronic combined systolic and diastolic CHF, NYHA class 3 (HCC) Active Problems:   Essential hypertension   Peripheral vascular disease (HCC)   CKD (chronic kidney disease) stage 3, GFR 30-59 ml/min   CORONARY ARTERY BYPASS GRAFT, HX OF   H/O aortic valve replacement   PAF (paroxysmal atrial fibrillation) (HCC)   SA node dysfunction (HCC)   Ascending aortic aneurysm (HCC)   ICD (implantable cardioverter-defibrillator) battery depletion  Acute on chronic congestive heart failure with reduced ejection fraction 30%, grade 3 DD Elevated troponin, mild demand ischemia -Cardiology to transition patient to Lasix 40 mg twice daily.  Continue Aldactone 12.5 mg daily. -Echocardiogram 12/5-ejection fraction less than 20%.  Grade 3 diastolic dysfunction/restrictive.  Global reduced systolic function. -Cardiology following-pacemaker interrogation -Monitor electrolytes.  Continue Coreg, not on ARB/ACEI secondary to renal failure -Elevated TSH, free T4 mildly elevated  History of coronary artery disease status post CABG Elevated troponin Bioprosthetic aortic valve -Minimal chest discomfort suspect from fluid overload.  Troponin very mildly elevated.  On aspirin, statin and Coreg -Cardiology following  History of  atrial fibrillation, paroxysmal -Noted anticoagulation due to history of GI bleed -Continue amiodarone and Coreg  CKD stage III -Baseline creatinine 1.4.  This morning 1.57  Carotid artery disease status post bilateral CEA -Already on aspirin and statin    DVT prophylaxis: Lovenox Code Status: Full code Family Communication: None Disposition Plan: Maintain hospital stay for careful diuresis.  Will discharge once cleared by cardiology  Consultants:   Cardiology  Procedures:   None  Antimicrobials:   None   Subjective: Feels a little better today but still has some exertional dyspnea.  Almost back to baseline.  Review of Systems Otherwise negative except as per HPI, including: General = no fevers, chills, dizziness, malaise, fatigue HEENT/EYES = negative for pain, redness, loss of vision, double vision, blurred vision, loss of hearing, sore throat, hoarseness, dysphagia Cardiovascular= negative for chest pain, palpitation, murmurs, lower extremity swelling Respiratory/lungs= negative for shortness of breath, cough, hemoptysis, wheezing, mucus production Gastrointestinal= negative for nausea, vomiting,, abdominal pain, melena, hematemesis Genitourinary= negative for Dysuria, Hematuria, Change in Urinary Frequency MSK = Negative for arthralgia, myalgias, Back Pain, Joint swelling  Neurology= Negative for headache, seizures, numbness, tingling  Psychiatry= Negative for anxiety, depression, suicidal and homocidal ideation Allergy/Immunology= Medication/Food allergy as listed  Skin= Negative for Rash, lesions, ulcers, itching   Objective: Vitals:   11/26/19 1600 11/26/19 1942 11/26/19 2312 11/27/19 0313  BP:  117/60 119/70 127/68  Pulse: (!) 58 60 60 68  Resp: (!) 23 18 16 20   Temp:  99.1 F (37.3 C) 98.7 F (37.1 C) 98.4 F (36.9 C)  TempSrc:  Oral Oral Oral  SpO2: 90% 98% 98% 95%  Weight:    77.8 kg  Height:        Intake/Output Summary (Last 24 hours) at  11/27/2019  8119 Last data filed at 11/27/2019 0700 Gross per 24 hour  Intake 400 ml  Output 3575 ml  Net -3175 ml   Filed Weights   11/26/19 1478 11/27/19 0313  Weight: 81.2 kg 77.8 kg    Examination: Constitutional: Not in acute distress Respiratory: Bibasilar crackles Cardiovascular: Normal sinus rhythm, no rubs Abdomen: Nontender nondistended good bowel sounds Musculoskeletal: No edema noted Skin: No rashes seen Neurologic: CN 2-12 grossly intact.  And nonfocal Psychiatric: Normal judgment and insight. Alert and oriented x 3. Normal mood.      Data Reviewed:   CBC: Recent Labs  Lab 11/25/19 1413 11/26/19 0434  WBC 6.1 7.5  HGB 9.2* 8.8*  HCT 30.1* 29.2*  MCV 87.5 87.7  PLT 225 295   Basic Metabolic Panel: Recent Labs  Lab 11/25/19 1413 11/26/19 0434 11/26/19 0733 11/27/19 0231  NA 137 138  --  138  K 4.2 3.5  --  3.4*  CL 102 101  --  101  CO2 25 24  --  25  GLUCOSE 95 80  --  89  BUN 28* 26*  --  29*  CREATININE 1.55* 1.46* 1.48* 1.57*  CALCIUM 9.4 9.1  --  8.9  MG  --   --   --  1.9   GFR: Estimated Creatinine Clearance: 44 mL/min (A) (by C-G formula based on SCr of 1.57 mg/dL (H)). Liver Function Tests: No results for input(s): AST, ALT, ALKPHOS, BILITOT, PROT, ALBUMIN in the last 168 hours. Recent Labs  Lab 11/25/19 1855  LIPASE 40   No results for input(s): AMMONIA in the last 168 hours. Coagulation Profile: Recent Labs  Lab 11/25/19 1413  INR 1.2   Cardiac Enzymes: No results for input(s): CKTOTAL, CKMB, CKMBINDEX, TROPONINI in the last 168 hours. BNP (last 3 results) No results for input(s): PROBNP in the last 8760 hours. HbA1C: No results for input(s): HGBA1C in the last 72 hours. CBG: No results for input(s): GLUCAP in the last 168 hours. Lipid Profile: No results for input(s): CHOL, HDL, LDLCALC, TRIG, CHOLHDL, LDLDIRECT in the last 72 hours. Thyroid Function Tests: Recent Labs    11/26/19 0434  TSH 6.858*  FREET4 1.28*    Anemia Panel: No results for input(s): VITAMINB12, FOLATE, FERRITIN, TIBC, IRON, RETICCTPCT in the last 72 hours. Sepsis Labs: No results for input(s): PROCALCITON, LATICACIDVEN in the last 168 hours.  Recent Results (from the past 240 hour(s))  SARS CORONAVIRUS 2 (TAT 6-24 HRS) Nasopharyngeal Nasopharyngeal Swab     Status: None   Collection Time: 11/25/19 10:11 PM   Specimen: Nasopharyngeal Swab  Result Value Ref Range Status   SARS Coronavirus 2 NEGATIVE NEGATIVE Final    Comment: (NOTE) SARS-CoV-2 target nucleic acids are NOT DETECTED. The SARS-CoV-2 RNA is generally detectable in upper and lower respiratory specimens during the acute phase of infection. Negative results do not preclude SARS-CoV-2 infection, do not rule out co-infections with other pathogens, and should not be used as the sole basis for treatment or other patient management decisions. Negative results must be combined with clinical observations, patient history, and epidemiological information. The expected result is Negative. Fact Sheet for Patients: SugarRoll.be Fact Sheet for Healthcare Providers: https://www.woods-mathews.com/ This test is not yet approved or cleared by the [place] FDA and  has been authorized for detection and/or diagnosis of SARS-CoV-2 by FDA under an Emergency Use Authorization (EUA). This EUA will remain  in effect (meaning this test can be used) for the duration of the COVID-19 declaration  under Section 56 4(b)(1) of the Act, 21 U.S.C. section 360bbb-3(b)(1), unless the authorization is terminated or revoked sooner. Performed at [facility], [address] [place], [place] [address]   MRSA PCR Screening     Status: None   Collection Time: 11/26/19  6:33 AM   Specimen: Nasal Mucosa; Nasopharyngeal  Result Value Ref Range Status   MRSA by PCR NEGATIVE NEGATIVE Final    Comment:        The GeneXpert MRSA Assay (FDA approved  for NASAL specimens only), is one component of a comprehensive MRSA colonization surveillance program. It is not intended to diagnose MRSA infection nor to guide or monitor treatment for MRSA infections. Performed at [facility], [address] [place], [place] [address]          Radiology Studies: Dg Chest 2 View  Result Date: 11/25/2019 CLINICAL DATA:  Progressive chest pain and shortness of breath. EXAM: CHEST - 2 VIEW COMPARISON:  06/05/2017 FINDINGS: Stable mild cardiomegaly. CABG. AICD in place. Pulmonary vascularity is normal. Aortic atherosclerosis. Small new right pleural effusion. Minimal atelectasis at the left lung base laterally. No significant bone abnormality. IMPRESSION: 1. New small right pleural effusion. 2. Minimal atelectasis at the left lung base. 3. Stable mild cardiomegaly. 4. Aortic Atherosclerosis (ICD10-I70.0). Electronically Signed   By: Francene BoyersJames  Maxwell M.D.   On: 11/25/2019 13:41        Scheduled Meds: . amiodarone  200 mg Oral Daily  . aspirin EC  81 mg Oral Daily  . atorvastatin  10 mg Oral Daily  . atropine  1 drop Right Eye BID  . brimonidine  1 drop Right Eye BID  . carvedilol  6.25 mg Oral BID WC  . dorzolamide-timolol  1 drop Right Eye BID  . enoxaparin (LOVENOX) injection  40 mg Subcutaneous Q24H  . ferrous sulfate  325 mg Oral BID WC  . folic acid  1 mg Oral Daily  . furosemide  40 mg Intravenous Q6H  . potassium chloride  40 mEq Oral Q4H  . prednisoLONE acetate  1 drop Right Eye TID AC & HS  . spironolactone  12.5 mg Oral Daily   Continuous Infusions:   LOS: 2 days   Time spent= 35 mins     Joline Maxcyhirag , MD [facility]  If 7PM-7AM, please contact night-coverage  11/27/2019, 7:26 AM

## 2019-11-27 NOTE — Plan of Care (Signed)

## 2019-11-28 LAB — BASIC METABOLIC PANEL
Anion gap: 11 (ref 5–15)
BUN: 28 mg/dL — ABNORMAL HIGH (ref 8–23)
CO2: 26 mmol/L (ref 22–32)
Calcium: 9 mg/dL (ref 8.9–10.3)
Chloride: 102 mmol/L (ref 98–111)
Creatinine, Ser: 2 mg/dL — ABNORMAL HIGH (ref 0.61–1.24)
GFR calc Af Amer: 36 mL/min — ABNORMAL LOW (ref >60)
GFR calc non Af Amer: 31 mL/min — ABNORMAL LOW (ref >60)
Glucose, Bld: 107 mg/dL — ABNORMAL HIGH (ref 70–99)
Potassium: 4.5 mmol/L (ref 3.5–5.1)
Sodium: 139 mmol/L (ref 135–145)

## 2019-11-28 LAB — MAGNESIUM: Magnesium: 1.9 mg/dL (ref 1.7–2.4)

## 2019-11-28 MED ORDER — FUROSEMIDE 40 MG PO TABS: 40.0000 mg | ORAL_TABLET | Freq: Every day | ORAL | 1 refills | Status: DC

## 2019-11-28 NOTE — Progress Notes (Signed)
Progress Note  Patient Name: Donald AmasDonald R Branscum Date of Encounter: 11/28/2019  Primary Cardiologist: Thurmon FairMihai Croitoru, MD   Subjective   Diuresed another 1.3L negative overnight.  Creatinine jumped today to 2 (from 1.57).  Inpatient Medications    Scheduled Meds: . amiodarone  200 mg Oral Daily  . aspirin EC  81 mg Oral Daily  . atorvastatin  10 mg Oral Daily  . atropine  1 drop Right Eye BID  . brimonidine  1 drop Right Eye BID  . carvedilol  6.25 mg Oral BID WC  . dorzolamide-timolol  1 drop Right Eye BID  . enoxaparin (LOVENOX) injection  40 mg Subcutaneous Q24H  . ferrous sulfate  325 mg Oral BID WC  . folic acid  1 mg Oral Daily  . prednisoLONE acetate  1 drop Right Eye TID AC & HS   Continuous Infusions:  PRN Meds: acetaminophen **OR** acetaminophen, nitroGLYCERIN, polyethylene glycol, senna-docusate   Vital Signs    Vitals:   11/28/19 0300 11/28/19 0339 11/28/19 0745 11/28/19 0800  BP:  133/87 124/65   Pulse:  71 62   Resp:  19 17   Temp:  98.6 F (37 C) 98.9 F (37.2 C)   TempSrc:  Oral Oral   SpO2: (!) 85% 94% 90% 97%  Weight:  76.9 kg    Height:        Intake/Output Summary (Last 24 hours) at 11/28/2019 0828 Last data filed at 11/28/2019 0801 Gross per 24 hour  Intake 720 ml  Output 2225 ml  Net -1505 ml   Last 3 Weights 11/28/2019 11/27/2019 11/26/2019  Weight (lbs) 169 lb 8.5 oz 171 lb 8.3 oz 179 lb 0.2 oz  Weight (kg) 76.9 kg 77.8 kg 81.2 kg     Telemetry    AV sequential pacing at 60 - Personally Reviewed  ECG    N/A  Physical Exam   General appearance: alert, no distress and thin Neck: JVD - 2 cm above sternal notch, no carotid bruit and thyroid not enlarged, symmetric, no tenderness/mass/nodules Lungs: diminished breath sounds bibasilar Heart: regular rate and rhythm Abdomen: soft, non-tender; bowel sounds normal; no masses,  no organomegaly and mildly protuberant Extremities: extremities normal, atraumatic, no cyanosis or edema  Pulses: 2+ and symmetric Skin: Skin color, texture, turgor normal. No rashes or lesions Neurologic: Grossly normal Psych: Pleasant   Labs    High Sensitivity Troponin:   Recent Labs  Lab 11/25/19 1413 11/25/19 1822 11/25/19 2137 11/26/19 0733  TROPONINIHS 18* 19* 20* 18*     Chemistry Recent Labs  Lab 11/26/19 0434 11/26/19 0733 11/27/19 0231 11/28/19 0232  NA 138  --  138 139  K 3.5  --  3.4* 4.5  CL 101  --  101 102  CO2 24  --  25 26  GLUCOSE 80  --  89 107*  BUN 26*  --  29* 28*  CREATININE 1.46* 1.48* 1.57* 2.00*  CALCIUM 9.1  --  8.9 9.0  GFRNONAA 46* 45* 42* 31*  GFRAA 53* 53* 49* 36*  ANIONGAP 13  --  12 11    Hematology Recent Labs  Lab 11/25/19 1413 11/26/19 0434  WBC 6.1 7.5  RBC 3.44* 3.33*  HGB 9.2* 8.8*  HCT 30.1* 29.2*  MCV 87.5 87.7  MCH 26.7 26.4  MCHC 30.6 30.1  RDW 17.2* 17.4*  PLT 225 207   BNP Recent Labs  Lab 11/25/19 1855  BNP 2,130.81,948.2*     DDimer  Recent Labs  Lab 11/26/19 0733  DDIMER 1.79*     Radiology    No results found.   Cardiac Studies   TTE: 11/26/2019  1. Left ventricular ejection fraction, by visual estimation, is <20%. The left ventricle has severely decreased function. There is moderately increased left ventricular hypertrophy.  2. Elevated left atrial and left ventricular end-diastolic pressures.  3. Left ventricular diastolic parameters are consistent with Grade III diastolic dysfunction (restrictive).  4. Severely dilated left ventricular internal cavity size.  5. Global right ventricle has moderately reduced systolic function.The right ventricular size is moderately enlarged. No increase in right ventricular wall thickness.  6. Left atrial size was severely dilated.  7. Right atrial size was normal.  8. The mitral valve is normal in structure. Moderate mitral valve regurgitation. No evidence of mitral stenosis.  9. The tricuspid valve is normal in structure. Tricuspid valve regurgitation is not  demonstrated. 10. Aortic valve regurgitation is not visualized. No evidence of aortic valve sclerosis or stenosis. 11. Normal gradients across the bioprosthetic valve. 12. The pulmonic valve was normal in structure. Pulmonic valve regurgitation is not visualized. 13. Aneurysm of the ascending aorta, measuring 53 mm. 14. Aortic dilatation noted. 15. Moderately elevated pulmonary artery systolic pressure. 16. The tricuspid regurgitant velocity is 2.50 m/s, and with an assumed right atrial pressure of 15 mmHg, the estimated right ventricular systolic pressure is moderately elevated at 40.0 mmHg. 17. The inferior vena cava is normal in size with greater than 50% respiratory variability, suggesting right atrial pressure of 3 mmHg. 18. There is ascending aortic aneurysm measuring 53 mm (51 mm on the chest CTA on 03/07/2019). 19. When compared to the prior study from 03/07/2018 LVEF has decreased from 30-35% to 15-20%. RVEF is moderately decreased. There are normal transoartic gradients. Moderate mitral regurgitation.   Patient Profile     78 y.o. 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 Admitted with 2 weeks of worsening exertional dyspnea. He has a long history of heart failure and has had multiple exacerbations over the years. Typically his weight is around 168 lbs however he states he has gained about 10 lbs over the past few weeks. No ICD firing episodes. He denies palpitations, syncope or presyncope.   In the ED the patient was found to have a small new right sided pleural effusion and minimal atelectasis at the left lung base. His BNP was elevated to 2,000 and his creatinine was stable at 1.55. His troponin was minimally elevated and stable at 20.    Assessment & Plan    1. Acute on chronic combined systolic and diastolic CHF 2. Ischemic cardiomyopathy, S/P CRT D 3. CAD, s/p CABG and AVR 2004 4. PAF not on AC sec GIB  Continued negative diuresis - creatinine up overnight - decrease lasix to 40 mg daily - will need close follow-up and repeat BMET later this week to determine if creatinine will improve.  Hold on adding Entresto in the setting of rising creatinine - aldactone on hold as well, consider resuming as outpatient.  For questions or updates, please contact [facility] Please consult [email or web address] for contact info under     Chrystie Nose, MD, [facility] Loll  [place]  [facility]  Medical Director of the [facility]id  Disorders [facility]  [facility]ovascular [facility] Diplomate of the American Board of Clinical Lipidology Attending Cardiologist  Direct Dial: [phone]  Fax: [phone]  Website:  [email or web address]  Pixie Casino, MD  11/28/2019, 8:28 AM

## 2019-11-28 NOTE — Progress Notes (Signed)
Discharge instruction given to patient regarding: change in medication, follow up appointments and information regarding setting patient up with a PCP.  IV was removed, clean dry intact.  Patient is waiting for wife for pick up.

## 2019-11-28 NOTE — Discharge Summary (Signed)
Physician Discharge Summary  TRACE WIRICK WUJ:[id number] DOB: [birth year] DOA: 11/25/2019  PCP: Patient, No Pcp Per  Admit date: 11/25/2019 Discharge date: 11/28/2019  Admitted From: Home Disposition: Home  Recommendations for Outpatient Follow-up:  1. Follow up with PCP in 1-2 weeks 2. Please obtain BMP/CBC in 5 days.  Follow-up with outpatient cardiology in 1 week 3. Lasix changed to 40 mg daily.  Potassium supplements held   Discharge Condition: Stable CODE STATUS: Full Diet recommendation: Heart healthy  Brief/Interim Summary: 78 year old with history of CAD status post CABG, bioprosthetic aortic valve replacement, atrial fibrillation not on anticoagulation secondary to GI bleed, systolic CHF EF 30% status post CRT, bilateral carotid stenosis status post bilateral CEA, CKD stage III came to the ER with worsening shortness of breath orthopnea.  Chest x-ray showed pleural effusion, COVID-19-negative.  Elevated BNP diagnosed with CHF exacerbation admitted for diuresis.  New echocardiogram showed ejection fraction less than 20%.  Patient did well with diuresis in the hospital.  On the day of discharge his ambulatory saturation were greater than 92% without any shortness of breath. Due to acute kidney injury secondary to aggressive diuresis his Lasix was temporarily changed from 40 mg twice daily oral to daily.  His potassium was 4.5 on the day of discharge therefore supplements were held.  He was advised to get lab work in 3 days after discharge for further recommendations can be made by outpatient cardiology.  Aldactone was held as well.  Entresto was not added.  Discussed with cardiology, Dr. Rennis Golden on the day of discharge regarding the plan.  Appreciate input from cardiology team.  Stable for discharge today.   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 daily, Aldactone held.  Outpatient lab work in about 3 days so further adjustments can be made.  Due to mild hyperkalemia potassium supplements are held.  Discussed with cardiology, Dr. Rennis Golden today.  Ambulatory saturation greater than 92% without shortness of breath. -Echocardiogram 12/5-ejection fraction less than 20%.  Grade 3 diastolic dysfunction/restrictive.  Global reduced systolic function. -Monitor electrolytes.  Continue Coreg, not on ARB/ACEI secondary to renal failure -Elevated TSH, free T4 mildly elevated  History of coronary artery disease status post CABG Elevated troponin Bioprosthetic aortic valve -Resume home regimen. -Cardiology following  History of atrial fibrillation, paroxysmal -Noted anticoagulation due to history of GI bleed -Continue amiodarone and Coreg  Acute kidney injury on CKD stage III -Baseline creatinine 1.4.    Creatinine increased to 2.0 due to aggressive diuresis.  Lasix transition to p.o. daily.  Carotid artery disease status post bilateral CEA -Already on aspirin and statin    Consultations:  Cardiology  Subjective: Feels much better wishes to go home.  Ambulatory oxygen more than 92% on room air without shortness of breath.  Discharge Exam: Vitals:   11/28/19 0745 11/28/19  0800  BP: 124/65   Pulse: 62   Resp: 17   Temp: 98.9 F (37.2 C)   SpO2: 90% 97%    Vitals:   11/28/19 0300 11/28/19 0339 11/28/19 0745 11/28/19 0800  BP:  133/87 124/65   Pulse:  71 62   Resp:  19 17   Temp:  98.6 F (37 C) 98.9 F (37.2 C)   TempSrc:  Oral Oral   SpO2: (!) 85% 94% 90% 97%  Weight:  76.9 kg    Height:        General: Pt is alert, awake, not in acute distress Cardiovascular: RRR, S1/S2 +, no rubs, no gallops Respiratory: Minimal bibasilar crackles Abdominal: Soft, NT, ND, bowel sounds + Extremities: no edema, no cyanosis  Discharge Instructions   Allergies as of 11/28/2019      Reactions   Atorvastatin Other (See Comments)   Joint pain (patient takes this, however)   Iron Other (See Comments)   Constipates the patient   Other Other (See Comments)   Patient is sodium-restricted, per his admission      Medication List    STOP taking these medications   potassium chloride SA 20 MEQ tablet Commonly known as: Klor-Con M10     TAKE these medications   acetaminophen 325 MG tablet Commonly known as: TYLENOL Take 325-650 mg by mouth every 6 (six) hours as needed for mild pain or headache.   amiodarone 200 MG tablet Commonly known as: PACERONE Take 1 tablet (200 mg total) by mouth daily.   aspirin EC 81 MG tablet Take 1 tablet (81 mg total) by mouth daily.   atorvastatin 10 MG tablet Commonly known as: LIPITOR TAKE 1 TABLET BY MOUTH EVERY DAY   atropine 1 % ophthalmic solution Place 1 drop into the right eye daily.   BENGAY EX Apply 1 application topically daily as needed (pain).   brimonidine 0.2 % ophthalmic solution Commonly known as: ALPHAGAN Place 1 drop into the right eye 2 (two) times daily.   carvedilol 6.25 MG tablet Commonly known as: COREG Take 1 tablet (6.25 mg total) by mouth 2 (two) times daily with a meal.   colchicine 0.6 MG tablet Take 1 tablet (0.6 mg total) by mouth as needed (gout attacks).   dorzolamide-timolol 22.3-6.8 MG/ML ophthalmic solution Commonly known as: COSOPT Place 1 drop into the  right eye 2 (two) times daily.   ferrous sulfate 325 (65 FE) MG EC tablet Take 1 tablet (325 mg total) by mouth 2 (two) times daily.   folic acid 1 MG tablet Commonly known as: FOLVITE Take 1 tablet (1 mg total) by mouth daily.   furosemide 40 MG tablet Commonly known as: LASIX Take 1 tablet (40 mg total) by mouth daily. What changed: when to take this   nitroGLYCERIN 0.4 MG SL tablet Commonly known as: NITROSTAT Place 1 tablet (0.4 mg) under the tongue every 5 minutes as needed for chest pain for up to 3 doses/call 9-1-1 and MD if no relief What changed:   how much to take  how to take this  when to take this  reasons to take this  additional instructions   prednisoLONE acetate 1 % ophthalmic suspension Commonly known as: PRED FORTE Place 1 drop into the right eye 4 (four) times daily.      Follow-up Information    Croitoru, Mihai, MD. Schedule an appointment as soon as possible for a visit in 1 week(s).   Specialty: Cardiology Contact information: [address]  [address] [place] [address] [phone]          Allergies  Allergen Reactions  . Atorvastatin Other (See Comments)    Joint pain (patient takes this, however)  . Iron Other (See Comments)    Constipates the patient  . Other Other (See Comments)    Patient is sodium-restricted, per his admission    You were cared for by a hospitalist during your hospital stay. If you have any questions about your discharge medications or the care you received while you were in the hospital after you are discharged, you can call the unit and asked to speak with the hospitalist on call if the hospitalist that took care of you is not available. Once you are discharged, your primary care physician will handle any further medical issues. Please note that no refills for any discharge medications will be authorized once you are discharged, as it is imperative that you return to your primary care physician (or  establish a relationship with a primary care physician if you do not have one) for your aftercare needs so that they can reassess your need for medications and monitor your lab values.   Procedures/Studies: Dg Chest 2 View  Result Date: 11/25/2019 CLINICAL DATA:  Progressive chest pain and shortness of breath. EXAM: CHEST - 2 VIEW COMPARISON:  06/05/2017 FINDINGS: Stable mild cardiomegaly. CABG. AICD in place. Pulmonary vascularity is normal. Aortic atherosclerosis. Small new right pleural effusion. Minimal atelectasis at the left lung base laterally. No significant bone abnormality. IMPRESSION: 1. New small right pleural effusion. 2. Minimal atelectasis at the left lung base. 3. Stable mild cardiomegaly. 4. Aortic Atherosclerosis (ICD10-I70.0). Electronically Signed   By: Francene BoyersJames  Maxwell M.D.   On: 11/25/2019 13:41      The results of significant diagnostics from this hospitalization (including imaging, microbiology, ancillary and laboratory) are listed below for reference.     Microbiology: Recent Results (from the past 240 hour(s))  SARS CORONAVIRUS 2 (TAT 6-24 HRS) Nasopharyngeal Nasopharyngeal Swab     Status: None   Collection Time: 11/25/19 10:11 PM   Specimen: Nasopharyngeal Swab  Result Value Ref Range Status   SARS Coronavirus 2 NEGATIVE NEGATIVE Final    Comment: (NOTE) SARS-CoV-2 target nucleic acids are NOT DETECTED. The SARS-CoV-2 RNA is generally detectable in upper and lower respiratory specimens during the acute phase of infection. Negative results do not preclude SARS-CoV-2 infection, do not rule out co-infections with other pathogens, and should not be used as the sole basis for treatment or other patient management decisions. Negative results must be combined with clinical observations, patient history, and epidemiological information. The expected result is Negative. Fact Sheet for Patients: HairSlick.nohttps://www.fda.gov/media/138098/download Fact Sheet for Healthcare  Providers: quierodirigir.comhttps://www.fda.gov/media/138095/download This test is not yet approved or cleared by the [place] FDA and  has been authorized for detection and/or diagnosis of SARS-CoV-2 by FDA under an Emergency Use Authorization (EUA). This EUA will remain  in effect (meaning this test can be used) for the duration of the COVID-19 declaration under Section 56 4(b)(1) of the Act, 21 U.S.C. section 360bbb-3(b)(1), unless the authorization is terminated or revoked sooner. Performed at [facility], [address] [place], [place] [address]   MRSA PCR Screening     Status: None   Collection Time: 11/26/19  6:33 AM   Specimen: Nasal Mucosa; Nasopharyngeal  Result Value Ref Range Status   MRSA by PCR NEGATIVE NEGATIVE Final    Comment:  The GeneXpert MRSA Assay (FDA approved for NASAL specimens only), is one component of a comprehensive MRSA colonization surveillance program. It is not intended to diagnose MRSA infection nor to guide or monitor treatment for MRSA infections. Performed at [facility], [place] [address], [place], [place] [address]      Labs: BNP (last 3 results) Recent Labs    11/25/19 1855  BNP 1,700.1*   Basic Metabolic Panel: Recent Labs  Lab 11/25/19 1413 11/26/19 0434 11/26/19 0733 11/27/19 0231 11/28/19 0232  NA 137 138  --  138 139  K 4.2 3.5  --  3.4* 4.5  CL 102 101  --  101 102  CO2 25 24  --  25 26  GLUCOSE 95 80  --  89 107*  BUN 28* 26*  --  29* 28*  CREATININE 1.55* 1.46* 1.48* 1.57* 2.00*  CALCIUM 9.4 9.1  --  8.9 9.0  MG  --   --   --  1.9 1.9   Liver Function Tests: No results for input(s): AST, ALT, ALKPHOS, BILITOT, PROT, ALBUMIN in the last 168 hours. Recent Labs  Lab 11/25/19 1855  LIPASE 40   No results for input(s): AMMONIA in the last 168 hours. CBC: Recent Labs  Lab 11/25/19 1413 11/26/19 0434  WBC 6.1 7.5  HGB 9.2* 8.8*  HCT 30.1* 29.2*  MCV 87.5 87.7  PLT 225 207   Cardiac Enzymes: No  results for input(s): CKTOTAL, CKMB, CKMBINDEX, TROPONINI in the last 168 hours. BNP: Invalid input(s): POCBNP CBG: No results for input(s): GLUCAP in the last 168 hours. D-Dimer Recent Labs    11/26/19 0733  DDIMER 1.79*   Hgb A1c No results for input(s): HGBA1C in the last 72 hours. Lipid Profile No results for input(s): CHOL, HDL, LDLCALC, TRIG, CHOLHDL, LDLDIRECT in the last 72 hours. Thyroid function studies Recent Labs    11/26/19 0434  TSH 6.858*   Anemia work up No results for input(s): VITAMINB12, FOLATE, FERRITIN, TIBC, IRON, RETICCTPCT in the last 72 hours. Urinalysis    Component Value Date/Time   COLORURINE YELLOW 03/08/2018 1832   APPEARANCEUR Clear 09/26/2019 1052   LABSPEC 1.012 03/08/2018 1832   PHURINE 6.0 03/08/2018 1832   GLUCOSEU Negative 09/26/2019 1052   HGBUR SMALL (A) 03/08/2018 1832   BILIRUBINUR Negative 09/26/2019 1052   KETONESUR NEGATIVE 03/08/2018 1832   PROTEINUR Negative 09/26/2019 1052   PROTEINUR NEGATIVE 03/08/2018 1832   UROBILINOGEN 0.2 03/02/2013 1646   NITRITE Negative 09/26/2019 1052   NITRITE NEGATIVE 03/08/2018 1832   LEUKOCYTESUR Negative 09/26/2019 1052   Sepsis Labs Invalid input(s): PROCALCITONIN,  WBC,  LACTICIDVEN Microbiology Recent Results (from the past 240 hour(s))  SARS CORONAVIRUS 2 (TAT 6-24 HRS) Nasopharyngeal Nasopharyngeal Swab     Status: None   Collection Time: 11/25/19 10:11 PM   Specimen: Nasopharyngeal Swab  Result Value Ref Range Status   SARS Coronavirus 2 NEGATIVE NEGATIVE Final    Comment: (NOTE) SARS-CoV-2 target nucleic acids are NOT DETECTED. The SARS-CoV-2 RNA is generally detectable in upper and lower respiratory specimens during the acute phase of infection. Negative results do not preclude SARS-CoV-2 infection, do not rule out co-infections with other pathogens, and should not be used as the sole basis for treatment or other patient management decisions. Negative results must be  combined with clinical observations, patient history, and epidemiological information. The expected result is Negative. Fact Sheet for Patients: SugarRoll.be Fact Sheet for Healthcare Providers: https://www.woods-mathews.com/ This test is not yet approved  or cleared by the [place] and  has been authorized for detection and/or diagnosis of SARS-CoV-2 by FDA under an Emergency Use Authorization (EUA). This EUA will remain  in effect (meaning this test can be used) for the duration of the COVID-19 declaration under Section 56 4(b)(1) of the Act, 21 U.S.C. section 360bbb-3(b)(1), unless the authorization is terminated or revoked sooner. Performed at [facility], [address] [place], [place] [address]   MRSA PCR Screening     Status: None   Collection Time: 11/26/19  6:33 AM   Specimen: Nasal Mucosa; Nasopharyngeal  Result Value Ref Range Status   MRSA by PCR NEGATIVE NEGATIVE Final    Comment:        The GeneXpert MRSA Assay (FDA approved for NASAL specimens only), is one component of a comprehensive MRSA colonization surveillance program. It is not intended to diagnose MRSA infection nor to guide or monitor treatment for MRSA infections. Performed at [facility], [address] [place], [place] [address]      Time coordinating discharge:  I have spent 35 minutes face to face with the patient and on the ward discussing the patients care, assessment, plan and disposition with other care givers. >50% of the time was devoted counseling the patient about the risks and benefits of treatment/Discharge disposition and coordinating care.   SIGNED:   Dimple Nanas, MD  [facility] 11/28/2019, 10:19 AM   If 7PM-7AM, please contact night-coverage

## 2019-11-28 NOTE — Plan of Care (Signed)

## 2019-11-28 NOTE — TOC Progression Note (Signed)
Transition of Care [facility]) - Progression Note    Patient Details  Name: [name] MRN: [id number] Date of Birth: 31-May-1941  [facility] CM/SW Contact  Zenon Mayo, RN Phone Number: 11/28/2019, 9:29 AM  Clinical Narrative:    Patient for dc today, NCM gave him the [facility] information to help assist in getting a PCP.         Expected Discharge Plan and Services           Expected Discharge Date: 11/28/19                                     Social Determinants of Health (SDOH) Interventions    Readmission Risk Interventions No flowsheet data found.

## 2019-11-28 NOTE — Progress Notes (Signed)
Ambulatory O2, patients O2 remained between 93-96 while ambulating.

## 2019-11-29 ENCOUNTER — Telehealth: Payer: Self-pay | Admitting: Cardiovascular Disease

## 2019-11-29 NOTE — Addendum Note (Signed)
Addended by: Douglass Rivers D on: 11/29/2019 12:49 PM   Modules accepted: Level of Service

## 2019-11-29 NOTE — Telephone Encounter (Signed)
New Message       Pt was discharged from [facility] yesterday. Daughter would like yo discuss the new medication he received yesterday please.

## 2019-11-29 NOTE — Progress Notes (Signed)
Remote ICD transmission.   

## 2019-11-29 NOTE — Telephone Encounter (Signed)
Spoke with pt and was given permission to speak with daughter re medical care as she helps with getting pt's medicines ready Daughter wanting clariifications why K was stopped and Lasix decreased med changes were made due to last labs elevated kidney functions and high normal K Daughter verbalizes understanding and agrees Per daughter pt's eye MD does not want pt taking ASA due to  bleeding of his eye .Pt is to have repeat labs in 5 days CBC and BMET./cy

## 2019-12-01 NOTE — Progress Notes (Signed)
[facility] - Clinic Note  12/02/2019     CHIEF COMPLAINT Patient presents for Retina Follow Up   HISTORY OF PRESENT ILLNESS: [name] is a 78 y.o. male who presents to the clinic today for:   HPI    Retina Follow Up    In right eye.  This started 1 month ago.  Since onset it is stable.  I, the attending physician,  performed the HPI with the patient and updated documentation appropriately.          Comments    F/U Hyphema OD. Patient states his vision is "good", denies new visual onsets/issues       Last edited by Rennis Chris, MD on 12/04/2019 11:34 PM. (History)    pt states he just got out of the hospital for fluid build up in his stomach, pt states his vision is doing well  Referring physician: No referring provider defined for this encounter.  HISTORICAL INFORMATION: vitre  Selected notes from the MEDICAL RECORD NUMBER    CURRENT MEDICATIONS: Current Outpatient Medications (Ophthalmic Drugs)  Medication Sig  . atropine 1 % ophthalmic solution Place 1 drop into the right eye daily.   . brimonidine (ALPHAGAN) 0.2 % ophthalmic solution Place 1 drop into the right eye 2 (two) times daily.  . dorzolamide-timolol (COSOPT) 22.3-6.8 MG/ML ophthalmic solution Place 1 drop into the right eye 2 (two) times daily.  . prednisoLONE acetate (PRED FORTE) 1 % ophthalmic suspension Place 1 drop into the right eye 4 (four) times daily.   No current facility-administered medications for this visit. (Ophthalmic Drugs)   Current Outpatient Medications (Other)  Medication Sig  . acetaminophen (TYLENOL) 325 MG tablet Take 325-650 mg by mouth every 6 (six) hours as needed for mild pain or headache.  [name] amiodarone (PACERONE) 200 MG tablet Take 1 tablet (200 mg total) by mouth daily.  [name] aspirin EC 81 MG tablet Take 1 tablet (81 mg total) by mouth daily.  [name] atorvastatin (LIPITOR) 10 MG tablet TAKE 1 TABLET BY MOUTH EVERY DAY (Patient taking differently: Take  10 mg by mouth daily. )  . carvedilol (COREG) 6.25 MG tablet Take 1 tablet (6.25 mg total) by mouth 2 (two) times daily with a meal.  . colchicine 0.6 MG tablet Take 1 tablet (0.6 mg total) by mouth as needed (gout attacks).  . ferrous sulfate 325 (65 FE) MG EC tablet Take 1 tablet (325 mg total) by mouth 2 (two) times daily.  . folic acid (FOLVITE) 1 MG tablet Take 1 tablet (1 mg total) by mouth daily.  . Menthol, Topical Analgesic, (BENGAY EX) Apply 1 application topically daily as needed (pain).  . nitroGLYCERIN (NITROSTAT) 0.4 MG SL tablet Place 1 tablet (0.4 mg) under the tongue every 5 minutes as needed for chest pain for up to 3 doses/call 9-1-1 and MD if no relief (Patient taking differently: Place 0.4 mg under the tongue every 5 (five) minutes x 3 doses as needed for chest pain (CALL 9-1-1 AND MD, IF NO RELIEF). )  . furosemide (LASIX) 80 MG tablet Take 1 tablet (80 mg total) by mouth daily.  . potassium chloride SA (KLOR-CON) 20 MEQ tablet Take 1 tablet (20 mEq total) by mouth daily.   Current Facility-Administered Medications (Other)  Medication Route  . sodium chloride flush (NS) 0.9 % injection 3 mL Intravenous      REVIEW OF SYSTEMS: ROS    Positive for: Eyes   Negative for: Constitutional, Gastrointestinal,  Neurological, Skin, Genitourinary, Musculoskeletal, HENT, Endocrine, Cardiovascular, Respiratory, Psychiatric, Allergic/Imm, Heme/Lymph   Last edited by Zenovia Jordan, LPN on 41/93/7902  4:09 AM. (History)       ALLERGIES Allergies  Allergen Reactions  . Atorvastatin Other (See Comments)    Joint pain (patient takes this, however)  . Iron Other (See Comments)    Constipates the patient  . Other Other (See Comments)    Patient is sodium-restricted, per his admission    PAST MEDICAL HISTORY Past Medical History:  Diagnosis Date  . Anginal pain ([place])   . Anxiety   . Aortic stenosis    a. s/p bioprosthetic AVR in 2010  . Arthritis    gout  . CAD  (coronary artery disease)    a. s/p CABG in 1992 b. redo CABG in 2004 c. cath in 2010 showing patent LIMA-LAD, free RIMA-OM, and SVG-OM, and 80% stenosis of small PLA branch  . Chest pain   . CHF (congestive heart failure) ([place])   . Dysrhythmia   . GERD (gastroesophageal reflux disease)    with gas and bloating probably cause of chest pain  . H/O atrial flutter    a. s/p ablation and not on anticoagulation secondary to GIB and alcohol abuse.   . H/O: gout   . Hyperlipidemia   . Hypertension   . Hypertensive retinopathy    OU  . ICD (implantable cardiac defibrillator) infection, s/p removal of ICD 07/12/2012   re-implanted Medtronic BiV ICD, serial number [id number]   . Ischemic cardiomyopathy    a. EF previously 20-25%, improved to 40-45% by echo in 03/2013 b. 10/2017: echo showing of 30-35% with Grade 2 DD, normal function of AVR, moderate MR.   . Myocardial infarction ([place])   . 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 Shortness of breath    Past Surgical History:  Procedure Laterality Date  . BI-VENTRICULAR IMPLANTABLE CARDIOVERTER DEFIBRILLATOR Right 08/18/2012   Medtronic BiV ICD, serial number [id number] ; Procedure: BI-VENTRICULAR IMPLANTABLE CARDIOVERTER DEFIBRILLATOR  (CRT-D);  Surgeon: Evans Lance, MD;  Location: [facility] CATH LAB;  Service: Cardiovascular;  Laterality: Right;  . BIV ICD GENERATOR CHANGEOUT N/A 09/08/2018   Procedure: BIV ICD GENERATOR CHANGEOUT;  Surgeon: Sanda Klein, MD;  Location: [facility] CV LAB;  Service: Cardiovascular;  Laterality: N/A;  . CARDIAC CATHETERIZATION  Dec 18, 2003   with a presentation of atrial flutter with rapid ventricular response. He had a cath and two stents to his PDA after his SVG to his RCA. His other graft were patent. He had LV with EF of 30%.   . CARDIOVERSION N/A 05/30/2019   Procedure: CARDIOVERSION;  Surgeon: Sanda Klein, MD;  Location: [facility] ENDOSCOPY;  Service: Cardiovascular;   Laterality: N/A;  . CARDIOVERSION N/A 09/30/2019   Procedure: CARDIOVERSION;  Surgeon: Sanda Klein, MD;  Location: [facility] ENDOSCOPY;  Service: Cardiovascular;  Laterality: N/A;  . CAROTID ENDARTERECTOMY  11/2002   left in December 2003 and known renal artery stenosis of 60-70%  . CATARACT EXTRACTION Bilateral   . CORONARY ARTERY BYPASS GRAFT  1993   redo in 2000, he has had multiple MI previous to both procedures.  [name] EYE SURGERY Bilateral    Cat Sx OU  . IMPLANTABLE CARDIOVERTER DEFIBRILLATOR (ICD) GENERATOR CHANGE N/A 06/25/2012   Procedure: ICD GENERATOR CHANGE;  Surgeon: Evans Lance, MD;  Location: [facility] CATH LAB;  Service: Cardiovascular;  Laterality: N/A;  . VALVE REPLACEMENT  2010  FAMILY HISTORY Family History  Problem Relation Age of Onset  . CAD Brother     SOCIAL HISTORY Social History   Tobacco Use  . Smoking status: Never Smoker  . Smokeless tobacco: Current User    Types: Chew  Substance Use Topics  . Alcohol use: Yes    Alcohol/week: 14.0 standard drinks    Types: 14 Cans of beer per week  . Drug use: No         OPHTHALMIC EXAM:  Base Eye Exam    Visual Acuity (Snellen - Linear)      Right Left   Dist [place] 20/30 -2 20/30 +2   Dist ph [place] 20/25 -1 20/20 -2       Tonometry (Tonopen, 10:06 AM)      Right Left   Pressure 10 11       Pupils      Dark Light Shape React APD   Right 3 2 Round Brisk None   Left 3 2 Round Brisk None       Visual Fields      Left Right    Full Full       Extraocular Movement      Right Left    Full, Ortho Full, Ortho       Neuro/Psych    Oriented x3: Yes   Mood/Affect: Normal       Dilation    Both eyes: 1.0% Mydriacyl, 2.5% Phenylephrine @ 10:06 AM        Slit Lamp and Fundus Exam    Slit Lamp Exam      Right Left   Lids/Lashes Dermatochalasis - upper lid, Meibomian gland dysfunction Dermatochalasis - upper lid, Meibomian gland dysfunction   Conjunctiva/Sclera Mild, temporal Pinguecula, white and quiet  Mild, temporal Pinguecula, white and quiet   Cornea 1+ Punctate epithelial erosions, Debris in tear film 2+ Punctate epithelial erosions, tear film debris   Anterior Chamber Deep and quiet, no cell or RBC Deep and quiet   Iris Round and very poorly dilated to 4mm, slightly irregular, vertical, linear TID at 0300 with +heme improved Round and poorly dilated to 4mm   Lens Posterior chamber intraocular lens, pigment deposition on anterior optic - improved Posterior chamber intraocular lens   Vitreous Mild Vitreous syneresis; no heme Mild Vitreous syneresis       Fundus Exam      Right Left   Disc pink, sharp Pink and Sharp   C/D Ratio 0.2 0.2   Macula flat;  Flat, Good foveal reflex, No heme or edema   Vessels mild attenuation milid Vascular attenuation   Periphery attached; no heme Attached             IMAGING AND PROCEDURES  Imaging and Procedures for @  OCT, Retina - OU - Both Eyes       Right Eye Quality was good. Central Foveal Thickness: 278. Progression has been stable. Findings include no IRF, normal foveal contour, no SRF, vitreomacular adhesion  (Partial PVD).   Left Eye Quality was good. Central Foveal Thickness: 258. Progression has been stable. Findings include normal foveal contour, no IRF, no SRF, vitreomacular adhesion .   Notes *Images captured and stored on drive  Diagnosis / Impression:  OD: NFP; no IRF/SRF; partial PVD OS: NFP, no IRF/SRF; +VMA  Clinical management:  See below  Abbreviations: NFP - Normal foveal profile. CME - cystoid macular edema. PED - pigment epithelial detachment. IRF - intraretinal fluid. SRF - subretinal fluid.  EZ - ellipsoid zone. ERM - epiretinal membrane. ORA - outer retinal atrophy. ORT - outer retinal tubulation. SRHM - subretinal hyper-reflective material                 ASSESSMENT/PLAN:    ICD-10-CM   1. Hyphema, right eye  H21.01   2. Vitreous hemorrhage, right eye (HCC)  H43.11   3.  Uveitis-hyphema-glaucoma syndrome of right eye  T85.398A    H20.9    H40.41X0   4. Retinal edema  H35.81 OCT, Retina - OU - Both Eyes  5. Essential hypertension  I10   6. Hypertensive retinopathy of both eyes  H35.033   7. Pseudophakia of both eyes  Z96.1     1-3. Vitreous hemorrhage w/ layered hyphema OD -- resolved        UGH syndrome OD  - pt with significant cardiac history and recent cardioversion on Eliquis -- pt has stopped Eliquis as of 10.21.20 -- cleared with cardiologist  - initially presented with 2 wk history of decreased vision OD -- was intermittent initially, but 2 days prior to presentation (~10.14.20), became stably poor without improvement  - initial exam showed poor dilation, layered hyphema and vitreous hemorrhage OD;   - large nasal TID w/ +heme within and mild pseudophakodonesis -- UGH syndrome more apparent and likely etiology    - s/p therapeutic AC tap (10.22.20 and 10.29.20) to remove some heme and lower IOP  - today, layered hyphema stably resolved -- no cell or RBC  - IOP okay at 10 mmHg OD  - VH cleared -- posterior pole without significant pathology  - OCT essentially normal OD  - prior b-scans showed +vit opacities/heme, no obvious RD or mass -- majority of vit opacities were anterior close to IOL  - pt denies trauma or fall; suspect VH related to Eliquis use + UGH syndrome  - VH/hyphema precautions reviewed -- minimize activities, keep head elevated, avoid ASA/NSAIDs/blood thinners as able  - cont  atropine OD QD   Completed Pred Forte taper   Cosopt BID OD -- decrease to QD  - f/u 3-4 months  4. No retinal edema on exam or OCT  5,6. Hypertensive retinopathy OU  - discussed importance of tight BP control  - monitor  7. Pseudophakia OU  - s/p CE/IOL OU  - monitor   Ophthalmic Meds Ordered this visit:  No orders of the defined types were placed in this encounter.      Return for f/u 3-4 months, VH OD, DFE, OCT.  There are no Patient  Instructions on file for this visit.   Explained the diagnoses, plan, and follow up with the patient and they expressed understanding.  Patient expressed understanding of the importance of proper follow up care.   This document serves as a record of services personally performed by Karie ChimeraBrian G. Demetrion Wesby, MD, PhD. It was created on their behalf by Laurian BrimAmanda Brown, OA, an ophthalmic assistant. The creation of this record is the provider's dictation and/or activities during the visit.    Electronically signed by: Laurian BrimAmanda Brown, OA 12.10.2020 11:36 PM  Karie ChimeraBrian G. Brityn Mastrogiovanni, M.D., Ph.D. Diseases & Surgery of the Retina and Vitreous [facility]  I have reviewed the above documentation for accuracy and completeness, and I agree with the above. Karie ChimeraBrian G. Fawzi Melman, M.D., Ph.D. 12/04/19 11:37 PM   Abbreviations: M myopia (nearsighted); A astigmatism; H hyperopia (farsighted); P presbyopia; Mrx spectacle prescription;  CTL contact lenses; OD right eye; OS left  eye; OU both eyes  XT exotropia; ET esotropia; PEK punctate epithelial keratitis; PEE punctate epithelial erosions; DES dry eye syndrome; MGD meibomian gland dysfunction; ATs artificial tears; PFAT's preservative free artificial tears; NSC nuclear sclerotic cataract; PSC posterior subcapsular cataract; ERM epi-retinal membrane; PVD posterior vitreous detachment; RD retinal detachment; DM diabetes mellitus; DR diabetic retinopathy; NPDR non-proliferative diabetic retinopathy; PDR proliferative diabetic retinopathy; CSME clinically significant macular edema; DME diabetic macular edema; dbh dot blot hemorrhages; CWS cotton wool spot; POAG primary open angle glaucoma; C/D cup-to-disc ratio; HVF humphrey visual field; GVF goldmann visual field; OCT optical coherence tomography; IOP intraocular pressure; BRVO Branch retinal vein occlusion; CRVO central retinal vein occlusion; CRAO central retinal artery occlusion; BRAO branch retinal artery occlusion; RT  retinal tear; SB scleral buckle; PPV pars plana vitrectomy; VH Vitreous hemorrhage; PRP panretinal laser photocoagulation; IVK intravitreal kenalog; VMT vitreomacular traction; MH Macular hole;  NVD neovascularization of the disc; NVE neovascularization elsewhere; AREDS age related eye disease study; ARMD age related macular degeneration; POAG primary open angle glaucoma; EBMD epithelial/anterior basement membrane dystrophy; ACIOL anterior chamber intraocular lens; IOL intraocular lens; PCIOL posterior chamber intraocular lens; Phaco/IOL phacoemulsification with intraocular lens placement; PRK photorefractive keratectomy; LASIK laser assisted in situ keratomileusis; HTN hypertension; DM diabetes mellitus; COPD chronic obstructive pulmonary disease

## 2019-12-02 ENCOUNTER — Other Ambulatory Visit: Payer: Self-pay

## 2019-12-02 ENCOUNTER — Encounter (INDEPENDENT_AMBULATORY_CARE_PROVIDER_SITE_OTHER): Payer: Self-pay | Admitting: Ophthalmology

## 2019-12-02 ENCOUNTER — Telehealth: Payer: Self-pay | Admitting: Cardiovascular Disease

## 2019-12-02 ENCOUNTER — Ambulatory Visit (INDEPENDENT_AMBULATORY_CARE_PROVIDER_SITE_OTHER): Payer: Medicare Other | Admitting: Ophthalmology

## 2019-12-02 DIAGNOSIS — Z79899 Other long term (current) drug therapy: Secondary | ICD-10-CM

## 2019-12-02 DIAGNOSIS — I5042 Chronic combined systolic (congestive) and diastolic (congestive) heart failure: Secondary | ICD-10-CM

## 2019-12-02 DIAGNOSIS — I480 Paroxysmal atrial fibrillation: Secondary | ICD-10-CM

## 2019-12-02 DIAGNOSIS — H4311 Vitreous hemorrhage, right eye: Secondary | ICD-10-CM | POA: Diagnosis not present

## 2019-12-02 DIAGNOSIS — Z961 Presence of intraocular lens: Secondary | ICD-10-CM

## 2019-12-02 DIAGNOSIS — T85398A Other mechanical complication of other ocular prosthetic devices, implants and grafts, initial encounter: Secondary | ICD-10-CM

## 2019-12-02 DIAGNOSIS — I10 Essential (primary) hypertension: Secondary | ICD-10-CM | POA: Diagnosis not present

## 2019-12-02 DIAGNOSIS — H2101 Hyphema, right eye: Secondary | ICD-10-CM | POA: Diagnosis not present

## 2019-12-02 DIAGNOSIS — H4041X0 Glaucoma secondary to eye inflammation, right eye, stage unspecified: Secondary | ICD-10-CM

## 2019-12-02 DIAGNOSIS — H209 Unspecified iridocyclitis: Secondary | ICD-10-CM

## 2019-12-02 DIAGNOSIS — Z95810 Presence of automatic (implantable) cardiac defibrillator: Secondary | ICD-10-CM

## 2019-12-02 DIAGNOSIS — H3581 Retinal edema: Secondary | ICD-10-CM | POA: Diagnosis not present

## 2019-12-02 DIAGNOSIS — H35033 Hypertensive retinopathy, bilateral: Secondary | ICD-10-CM

## 2019-12-02 MED ORDER — POTASSIUM CHLORIDE CRYS ER 20 MEQ PO TBCR: 20.0000 meq | EXTENDED_RELEASE_TABLET | Freq: Every day | ORAL | 3 refills | Status: DC

## 2019-12-02 MED ORDER — FUROSEMIDE 80 MG PO TABS: 80.0000 mg | ORAL_TABLET | Freq: Every day | ORAL | 3 refills | Status: DC

## 2019-12-02 NOTE — Telephone Encounter (Signed)
Poke with the pts daughter and she verbalized understanding of Dr. Victorino December instructions.. I will forward message to the PCC's to make the pt an appt next week with an APP... it MUST be next week per Dr. Sallyanne Kuster. To f/u heart failure.   Pts daughter is a Marine scientist and asks if the appt notes can have the provider that the pt sees call her at [phone].   They prefer he comes to the [place] office not [place].

## 2019-12-02 NOTE — Telephone Encounter (Signed)
Pt daughter, Eustaquio Maize, called to report the pt had been D/C'ed from the hospital on 12/7 he has gained 5 lbs since this past Monday and he is very SOB which has worsened since last night when he could not lay flat to sleep. 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 Today with minimal exertion he is very SOB and his abdomen is distended but no increase lower extremity edema. He has been taking his Lasix 40 mg once daily.  Will talk to Dr. Sallyanne Kuster for his recommendations.

## 2019-12-02 NOTE — Telephone Encounter (Signed)
Daughter was calling on behalf of the patient. Patient is currently at another [place] appt now, so the patient and his wife had the daughter call. The daughter states that the patient was discharged from the hospital on Monday and has gained 5 pounds since then. While he was in the hospital they decreased the amount of lasix that he takes and stopped the potassium pill. Daughter called earlier in the week with the same concern, but now the patient is experiencing some SOB and Swelling  Pt c/o Shortness Of Breath: STAT if SOB developed within the last 24 hours or pt is noticeably SOB on the phone  1. Are you currently SOB (can you hear that pt is SOB on the phone)? yes  2. How long have you been experiencing SOB? Daughter is not sure.   3. Are you SOB when sitting or when up moving around? Not sure  4. Are you currently experiencing any other symptoms? Swelling  Pt c/o swelling: STAT is pt has developed SOB within 24 hours  1) How much weight have you gained and in what time span? 5 pounds since Monday  2) If swelling, where is the swelling located? Stomach  3) Are you currently taking a fluid pill? Lasix 40 mg   4) Are you currently SOB? yes  5) Do you have a log of your daily weights (if so, list)? Daughter does not have the record of weight  6) Have you gained 3 pounds in a day or 5 pounds in a week? 5 pounds in a week  7) Have you traveled recently? Not since hospital discharge  Please call daughter. The patient and his wife get information confused at times.

## 2019-12-02 NOTE — Telephone Encounter (Signed)
LM for pts daughter Eustaquio Maize (on [place]):   After talking with Dr. Sallyanne Kuster:   Pt to: Take Lasix 80 mg qd Take KCL 20 meq po qd BMET Monday 12/05/19 at [facility].. orders released in Epic  Need [place] appt next week.

## 2019-12-04 ENCOUNTER — Encounter (INDEPENDENT_AMBULATORY_CARE_PROVIDER_SITE_OTHER): Payer: Self-pay | Admitting: Ophthalmology

## 2019-12-06 LAB — BASIC METABOLIC PANEL
BUN/Creatinine Ratio: 16 (ref 10–24)
BUN: 24 mg/dL (ref 8–27)
CO2: 24 mmol/L (ref 20–29)
Calcium: 9.1 mg/dL (ref 8.6–10.2)
Chloride: 101 mmol/L (ref 96–106)
Creatinine, Ser: 1.5 mg/dL — ABNORMAL HIGH (ref 0.76–1.27)
GFR calc Af Amer: 52 mL/min/{1.73_m2} — ABNORMAL LOW (ref >59)
GFR calc non Af Amer: 45 mL/min/{1.73_m2} — ABNORMAL LOW (ref >59)
Glucose: 84 mg/dL (ref 65–99)
Potassium: 4.1 mmol/L (ref 3.5–5.2)
Sodium: 139 mmol/L (ref 134–144)

## 2019-12-06 LAB — CBC
Hematocrit: 31.7 % — ABNORMAL LOW (ref 37.5–51.0)
Hemoglobin: 9.6 g/dL — ABNORMAL LOW (ref 13.0–17.7)
MCH: 26 pg — ABNORMAL LOW (ref 26.6–33.0)
MCHC: 30.3 g/dL — ABNORMAL LOW (ref 31.5–35.7)
MCV: 86 fL (ref 79–97)
Platelets: 271 10*3/uL (ref 150–450)
RBC: 3.69 x10E6/uL — ABNORMAL LOW (ref 4.14–5.80)
RDW: 16.1 % — ABNORMAL HIGH (ref 11.6–15.4)
WBC: 5.5 10*3/uL (ref 3.4–10.8)

## 2019-12-06 LAB — TSH: TSH: 6.99 u[IU]/mL — ABNORMAL HIGH (ref 0.450–4.500)

## 2019-12-06 LAB — T4, FREE: Free T4: 1.55 ng/dL (ref 0.82–1.77)

## 2019-12-07 ENCOUNTER — Telehealth: Payer: Self-pay | Admitting: *Deleted

## 2019-12-07 NOTE — Telephone Encounter (Signed)
The patient stated that he is still short of breath when ambulating and when at rest.  He is currently taking Furosemide 80 mg once daily (2- 40 mg tablets). His current weight today was 173 pounds. He stated that he normally weighs 170.  The patient also has complaints of a dry mouth. He stated that he normally drinks 2-3 glasses of water a day as well as "pop". He has been urinating less and stated that his urine is a dark yellow.  He has a follow up appointment on 12/21 with Roby Lofts, PA.

## 2019-12-07 NOTE — Telephone Encounter (Signed)
Patient made aware of results and verbalized understanding.  

## 2019-12-07 NOTE — Telephone Encounter (Signed)
I recommend we continue the furosemide as currently prescribed.  Hopefully he will gently diurese down to his usual dry weight.  Recheck labs when he comes in on the 21st.  He should call us if his weight reaches 175 pounds or higher.

## 2019-12-07 NOTE — Telephone Encounter (Signed)
-----   Message from Sanda Klein, MD sent at 12/06/2019  8:20 AM EST ----- Kidney function is back to his baseline, mildly abnormal.  Potassium is okay.  Is his breathing getting better and has weight come down?

## 2019-12-07 NOTE — Telephone Encounter (Signed)
Left a message for the patient to call back.  

## 2019-12-08 NOTE — Telephone Encounter (Signed)
Patients wife is returning phone call. She states a detailed voicemail can be left if she doesn't answer.

## 2019-12-08 NOTE — Telephone Encounter (Signed)
I agree with the recommendation. An extra 40 mg of furosemide on any day that he weights 175 or higher

## 2019-12-08 NOTE — Telephone Encounter (Signed)
Called and spoke to wife. Gave Dr Lurline Del advice of taking extra lasix. Verbalized understanding. Advised to call if became SOB or had a hard time breathing. Verbalized understanding. Advised to keep appt on 12/21.

## 2019-12-08 NOTE — Telephone Encounter (Signed)
Spoke to pt wife per DPR. Stated that pt weighed 175lbs today. Advised to take 1 extra dose of lasix today and to re-weight tomorrow. Asked if pt felt SOB or having a hard time breathing, denied. Advised that this message will be sent to Dr C for review.

## 2019-12-11 NOTE — Progress Notes (Signed)
Cardiology Office Note   Date:  12/12/2019   ID:  [name], DOB 10-Apr-1941, MRN [id number]  PCP:  Patient, No Pcp Per  Cardiologist:  Donald FairMihai Croitoru, MD EP: None  Chief Complaint  Patient presents with  . Follow-up  . Shortness of Breath  . Headache  . Edema    right arm       History of Present Illness: [name] is a [age] y.o. male with a PMH of CAD s/p CABG, bioprosthetic aortic valve, paroxysmal atrial fibrillation not on anticoagulation 2/2 GI bleed and intraocular bleed, chronic combined CHF s/p CRT, bilateral carotid stenosis s/p bilateral CEA, CKD stage 3, who presents for post hospital follow-up of his CHF.  He was admitted to the hospital from 11/25/2019-11/28/2019 for CHF. Echo that admission revealed EF 15-20% with G3DD. He was diuresed with IV lasix, though management limited by CKD. His weight was 169lbs on the day of discharge. He was unable to be started on entresto or spironolactone due to elevated Cr at discharge.   His daughter, [name], called the office 12/02/2019 to report that [name] gained 5lbs since discharge from the hospital and also had increased SOB. He was recommended to increase his lasix from 40mg  daily to 80mg  daily. Follow-up call 12/07/2019 reported continued SOB and decreased urine output. Weight trended up to 175lbs and he was recommended to take one additional dose of lasix 12/08/2019. His weights over the past 2 days were 174lbs yesterday and 173lbs this morning. Weight here is 187lbs, though he is wearing work boots and jeans which are quite heavy. He is still having significant DOE. He has been sleeping propped up on his couch and has occasional PND. No complaints of chest pains. 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 Unfortunately with ongoing diuresis, he has since developed gout in his right elbow/hand which is swollen, painful, and warm to touch. He started colchicine yesterday.     Past Medical History:    Diagnosis Date  . Anginal pain (HCC)   . Anxiety   . Aortic stenosis    a. s/p bioprosthetic AVR in 2010  . Arthritis    gout  . CAD (coronary artery disease)    a. s/p CABG in 1992 b. redo CABG in 2004 c. cath in 2010 showing patent LIMA-LAD, free RIMA-OM, and SVG-OM, and 80% stenosis of small PLA branch  . Chest pain   . CHF (congestive heart failure) (HCC)   . Dysrhythmia   . GERD (gastroesophageal reflux disease)    with gas and bloating probably cause of chest pain  . H/O atrial flutter    a. s/p ablation and not on anticoagulation secondary to GIB and alcohol abuse.   . H/O: gout   . Hyperlipidemia   . Hypertension   . Hypertensive retinopathy    OU  . ICD (implantable cardiac defibrillator) infection, s/p removal of ICD 07/12/2012   re-implanted Medtronic BiV ICD, serial number [id number]   . Ischemic cardiomyopathy    a. EF previously 20-25%, improved to 40-45% by echo in 03/2013 b. 10/2017: echo showing of 30-35% with Grade 2 DD, normal function of AVR, moderate MR.   . Myocardial infarction (HCC)   . 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 Shortness of breath     Past Surgical History:  Procedure Laterality Date  . BI-VENTRICULAR IMPLANTABLE CARDIOVERTER DEFIBRILLATOR Right 08/18/2012   Medtronic BiV ICD,  serial number [id number] ; Procedure: BI-VENTRICULAR IMPLANTABLE CARDIOVERTER DEFIBRILLATOR  (CRT-D);  Surgeon: Evans Lance, MD;  Location: [facility] CATH LAB;  Service: Cardiovascular;  Laterality: Right;  . BIV ICD GENERATOR CHANGEOUT N/A 09/08/2018   Procedure: BIV ICD GENERATOR CHANGEOUT;  Surgeon: Sanda Klein, MD;  Location: [place] CV LAB;  Service: Cardiovascular;  Laterality: N/A;  . CARDIAC CATHETERIZATION  Dec 18, 2003   with a presentation of atrial flutter with rapid ventricular response. He had a cath and two stents to his PDA after his SVG to his RCA. His other graft were patent. He had LV with EF of 30%.   .  CARDIOVERSION N/A 05/30/2019   Procedure: CARDIOVERSION;  Surgeon: Sanda Klein, MD;  Location: [place] ENDOSCOPY;  Service: Cardiovascular;  Laterality: N/A;  . CARDIOVERSION N/A 09/30/2019   Procedure: CARDIOVERSION;  Surgeon: Sanda Klein, MD;  Location: [place] ENDOSCOPY;  Service: Cardiovascular;  Laterality: N/A;  . CAROTID ENDARTERECTOMY  11/2002   left in December 2003 and known renal artery stenosis of 60-70%  . CATARACT EXTRACTION Bilateral   . CORONARY ARTERY BYPASS GRAFT  1993   redo in 2000, he has had multiple MI previous to both procedures.  [name] EYE SURGERY Bilateral    Cat Sx OU  . IMPLANTABLE CARDIOVERTER DEFIBRILLATOR (ICD) GENERATOR CHANGE N/A 06/25/2012   Procedure: ICD GENERATOR CHANGE;  Surgeon: Evans Lance, MD;  Location: [facility] CATH LAB;  Service: Cardiovascular;  Laterality: N/A;  . VALVE REPLACEMENT  2010     Current Outpatient Medications  Medication Sig Dispense Refill  . acetaminophen (TYLENOL) 325 MG tablet Take 325-650 mg by mouth every 6 (six) hours as needed for mild pain or headache.    [name] amiodarone (PACERONE) 200 MG tablet Take 1 tablet (200 mg total) by mouth daily. 30 tablet 11  . aspirin EC 81 MG tablet Take 1 tablet (81 mg total) by mouth daily. 90 tablet 3  . atorvastatin (LIPITOR) 10 MG tablet TAKE 1 TABLET BY MOUTH EVERY DAY (Patient taking differently: Take 10 mg by mouth daily. ) 90 tablet 0  . atropine 1 % ophthalmic solution Place 1 drop into the right eye daily.     . brimonidine (ALPHAGAN) 0.2 % ophthalmic solution Place 1 drop into the right eye 2 (two) times daily. 10 mL 1  . carvedilol (COREG) 6.25 MG tablet Take 1 tablet (6.25 mg total) by mouth 2 (two) times daily with a meal. 60 tablet 11  . colchicine 0.6 MG tablet Take 1 tablet (0.6 mg total) by mouth as needed (gout attacks). 30 tablet 0  . dorzolamide-timolol (COSOPT) 22.3-6.8 MG/ML ophthalmic solution Place 1 drop into the right eye 2 (two) times daily.    . ferrous sulfate 325 (65 FE) MG EC  tablet Take 1 tablet (325 mg total) by mouth 2 (two) times daily. 60 tablet 11  . folic acid (FOLVITE) 1 MG tablet Take 1 tablet (1 mg total) by mouth daily. 30 tablet 11  . furosemide (LASIX) 80 MG tablet Take 1 tablet (80 mg total) by mouth daily. 90 tablet 3  . Menthol, Topical Analgesic, (BENGAY EX) Apply 1 application topically daily as needed (pain).    . nitroGLYCERIN (NITROSTAT) 0.4 MG SL tablet Place 1 tablet (0.4 mg) under the tongue every 5 minutes as needed for chest pain for up to 3 doses/call 9-1-1 and MD if no relief (Patient taking differently: Place 0.4 mg under the tongue every 5 (five) minutes x 3 doses as needed for  chest pain (CALL 9-1-1 AND MD, IF NO RELIEF). ) 25 tablet 0  . potassium chloride SA (KLOR-CON) 20 MEQ tablet Take 1 tablet (20 mEq total) by mouth daily. 90 tablet 3  . prednisoLONE acetate (PRED FORTE) 1 % ophthalmic suspension Place 1 drop into the right eye 4 (four) times daily. 15 mL 1  . [START ON 12/14/2019] metolazone (ZAROXOLYN) 2.5 MG tablet Take 1 tablet (2.5 mg) on Wednesday December 14, 2019. 10 tablet 0   Current Facility-Administered Medications  Medication Dose Route Frequency Provider Last Rate Last Admin  . sodium chloride flush (NS) 0.9 % injection 3 mL  3 mL Intravenous Q12H [name], Mihai, MD        Allergies:   Atorvastatin, Iron, and Other    Social History:  The patient  reports that he has never smoked. His smokeless tobacco use includes chew. He reports current alcohol use of about 14.0 standard drinks of alcohol per week. He reports that he does not use drugs.   Family History:  The patient's family history includes CAD in his brother.    ROS:  Please see the history of present illness.   Otherwise, review of systems are positive for none.   All other systems are reviewed and negative.    PHYSICAL EXAM: VS:  BP 126/89   Pulse 84   Temp 98.2 F (36.8 C)   Ht 6\' 1"  (1.854 m)   Wt 187 lb (84.8 kg)   SpO2 90%   BMI 24.67 kg/m   , BMI Body mass index is 24.67 kg/m. GEN: Well nourished, well developed, in no acute distress HEENT: sclera anicteric Neck: no JVD, carotid bruits, or masses Cardiac: RRR; no murmurs, rubs, or gallops, no LE edema  Respiratory: crackles at lung bases; no wheezing/rhonchi GI: soft, nontender, nondistended, + BS MS: no deformity or atrophy; right elbow/hand with increased warmth, edema, and TTP. Skin: warm and dry, no rash Neuro:  Strength and sensation are intact Psych: euthymic mood, full affect   EKG:  EKG is not ordered today.   Recent Labs: 09/26/2019: ALT 8 11/25/2019: B Natriuretic Peptide 1,948.2 11/28/2019: Magnesium 1.9 12/05/2019: BUN 24; Creatinine, Ser 1.50; Hemoglobin 9.6; Platelets 271; Potassium 4.1; Sodium 139; TSH 6.990    Lipid Panel    Component Value Date/Time   CHOL 87 (L) 05/05/2019 1402   TRIG 44 05/05/2019 1402   HDL 46 05/05/2019 1402   CHOLHDL 1.9 05/05/2019 1402   CHOLHDL 4.2 12/19/2016 0841   VLDL 15 12/19/2016 0841   LDLCALC 32 05/05/2019 1402      Wt Readings from Last 3 Encounters:  12/12/19 187 lb (84.8 kg)  11/28/19 169 lb 8.5 oz (76.9 kg)  11/08/19 176 lb (79.8 kg)      Other studies Reviewed: Additional studies/ records that were reviewed today include:   Echocardiogram 11/26/2019: 1. Left ventricular ejection fraction, by visual estimation, is <20%. The left ventricle has severely decreased function. There is moderately increased left ventricular hypertrophy.  2. Elevated left atrial and left ventricular end-diastolic pressures.  3. Left ventricular diastolic parameters are consistent with Grade III diastolic dysfunction (restrictive).  4. Severely dilated left ventricular internal cavity size.  5. Global right ventricle has moderately reduced systolic function.The right ventricular size is moderately enlarged. No increase in right ventricular wall thickness.  6. Left atrial size was severely dilated.  7. Right atrial size was  normal.  8. The mitral valve is normal in structure. Moderate mitral valve regurgitation. No evidence  of mitral stenosis.  9. The tricuspid valve is normal in structure. Tricuspid valve regurgitation is not demonstrated. 10. Aortic valve regurgitation is not visualized. No evidence of aortic valve sclerosis or stenosis. 11. Normal gradients across the bioprosthetic valve. 12. The pulmonic valve was normal in structure. Pulmonic valve regurgitation is not visualized. 13. Aneurysm of the ascending aorta, measuring 53 mm. 14. Aortic dilatation noted. 15. Moderately elevated pulmonary artery systolic pressure. 16. The tricuspid regurgitant velocity is 2.50 m/s, and with an assumed right atrial pressure of 15 mmHg, the estimated right ventricular systolic pressure is moderately elevated at 40.0 mmHg. 17. The inferior vena cava is normal in size with greater than 50% respiratory variability, suggesting right atrial pressure of 3 mmHg. 18. There is ascending aortic aneurysm measuring 53 mm (51 mm on the chest CTA on 03/07/2019). 19. When compared to the prior study from 03/07/2018 LVEF has decreased from 30-35% to 15-20%. RVEF is moderately decreased. There are normal transoartic gradients. Moderate mitral regurgitation.    ASSESSMENT AND PLAN:  1. Acute on chronic combined CHF: recent hospitalization with EF 15-20% and G3DD. CKD and history of hypotension has limited GDT. Weight is 187 here (wearing heavy boots) but 173 at home this morning, up from 169lbs at discharge 11/28/2019. No ICD firings. He has been taking lasix  daily with extra doses of  daily for weight >/= 175lbs which he only required 12/08/2019. Still with significant DOE and orthopnea. Faint crackles on exam today. - Will check BMET today to monitor kidney function. - Continue carvedilol - Unable to add entresto/spironolactone due to CKD - Will give metolazone 2.5mg  on Wednesday (allow time for gout to improve) - Continue lasix   daily   2. Paroxysmal atrial fibrillation: not on anticoagulation due to history of GI bleed and intraocular bleeding.  - Continue amiodarone for rhythm control - Continue carvedilol for rate control  3. CAD s/p CABG: no anginal complaints.  - Continue aspirin and statin - Continue carvedilol  4. HTN: BP 126/89 today - Continue carvedilol  5. HLD: LDL 32 04/2019; at goal of <40  - Continue statin  6. Carotid artery disease: s/p bilateral CEA - Continue aspirin and statin  7. Ascending aortic aneurysm: 5.3cm on echo 11/25/2019. Not a candidate for 3rd thoracotomy, therefore no further routine monitor justified - Continue BP management as above  8. CKD stage 3: Cr 1.5 (baseline) 12/05/2019 - Will check BMET today for close monitoring  9. Chronic amiodarone therapy: TSH elevated, though FT4 wnl. Has been uptrending over the past few months.  - Continue to monitor periodically with amiodarone use.   10. Gout flare: patient with increased warmth, swelling, and pain of right elbow/hand c/w prior gout flares. He started colchicine yesterday - Continue colchicine      Current medicines are reviewed at length with the patient today.  The patient does not have concerns regarding medicines.  The following changes have been made:  As above  Labs/ tests ordered today include:   Orders Placed This Encounter  Procedures  . Basic metabolic panel     Disposition:   FU with me or [place] in 1 week  Signed, Beatriz Stallion, PA-C  12/12/2019 11:51 AM

## 2019-12-12 ENCOUNTER — Other Ambulatory Visit: Payer: Self-pay

## 2019-12-12 ENCOUNTER — Ambulatory Visit: Payer: Medicare Other | Admitting: Medical

## 2019-12-12 ENCOUNTER — Encounter: Payer: Self-pay | Admitting: Medical

## 2019-12-12 VITALS — BP 126/89 | HR 84 | Temp 98.2°F | Ht 73.0 in | Wt 187.0 lb

## 2019-12-12 DIAGNOSIS — N1831 Chronic kidney disease, stage 3a: Secondary | ICD-10-CM

## 2019-12-12 DIAGNOSIS — I10 Essential (primary) hypertension: Secondary | ICD-10-CM

## 2019-12-12 DIAGNOSIS — I712 Thoracic aortic aneurysm, without rupture: Secondary | ICD-10-CM

## 2019-12-12 DIAGNOSIS — I480 Paroxysmal atrial fibrillation: Secondary | ICD-10-CM | POA: Diagnosis not present

## 2019-12-12 DIAGNOSIS — E7800 Pure hypercholesterolemia, unspecified: Secondary | ICD-10-CM

## 2019-12-12 DIAGNOSIS — I5043 Acute on chronic combined systolic (congestive) and diastolic (congestive) heart failure: Secondary | ICD-10-CM | POA: Diagnosis not present

## 2019-12-12 DIAGNOSIS — I25810 Atherosclerosis of coronary artery bypass graft(s) without angina pectoris: Secondary | ICD-10-CM

## 2019-12-12 DIAGNOSIS — Z9889 Other specified postprocedural states: Secondary | ICD-10-CM

## 2019-12-12 DIAGNOSIS — I7121 Aneurysm of the ascending aorta, without rupture: Secondary | ICD-10-CM

## 2019-12-12 MED ORDER — METOLAZONE 2.5 MG PO TABS: ORAL_TABLET | ORAL | 0 refills | Status: DC

## 2019-12-12 MED ORDER — BIOTENE DRY MOUTH MT LIQD: 15.0000 mL | OROMUCOSAL | 0 refills | Status: DC | PRN

## 2019-12-12 NOTE — Patient Instructions (Addendum)
Medication Instructions:  Your physician has recommended you make the following change in your medication:   ON Wednesday December 14, 2019, TAKE METOLAZONE 2.5 MG. ONLY ONE TABLET BY MOUTH THIRTY MINUTES BEFORE YOU TAKE YOUR LASIX. (Take this medication as long as your gout is improving.)  *If you need a refill on your cardiac medications before your next appointment, please call your pharmacy*  Lab Work: Your physician recommends that you return for lab work today:  [place]  If you have labs (blood work) drawn today and your tests are completely normal, you will receive your results only by: [name] MyChart Message (if you have MyChart) OR . A paper copy in the mail If you have any lab test that is abnormal or we need to change your treatment, we will call you to review the results.  Testing/Procedures: NONE  Follow-Up: At [facility], you and your health needs are our priority.  As part of our continuing mission to provide you with exceptional heart care, we have created designated Provider Care Teams.  These Care Teams include your primary Cardiologist (physician) and Advanced Practice Providers (APPs -  Physician Assistants and Nurse Practitioners) who all work together to provide you with the care you need, when you need it.  Your next appointment:   December 21, 2019  The format for your next appointment:   In Person  Provider:   Roby Lofts, PA-C  Other Instructions CONTACT THE OFFICE IF YOU NOTICE YOUR WEIGHT IS ABOVE 175 lbs.

## 2019-12-13 LAB — BASIC METABOLIC PANEL
BUN/Creatinine Ratio: 17 (ref 10–24)
BUN: 23 mg/dL (ref 8–27)
CO2: 21 mmol/L (ref 20–29)
Calcium: 9.3 mg/dL (ref 8.6–10.2)
Chloride: 98 mmol/L (ref 96–106)
Creatinine, Ser: 1.39 mg/dL — ABNORMAL HIGH (ref 0.76–1.27)
GFR calc Af Amer: 57 mL/min/{1.73_m2} — ABNORMAL LOW (ref >59)
GFR calc non Af Amer: 49 mL/min/{1.73_m2} — ABNORMAL LOW (ref >59)
Glucose: 80 mg/dL (ref 65–99)
Potassium: 3.8 mmol/L (ref 3.5–5.2)
Sodium: 139 mmol/L (ref 134–144)

## 2019-12-20 ENCOUNTER — Telehealth: Payer: Self-pay | Admitting: Medical

## 2019-12-20 NOTE — Telephone Encounter (Signed)
New message:     Patient daughter calling asking to speak with you concering her father. Please call patient daughter.

## 2019-12-20 NOTE — Telephone Encounter (Signed)
LMTCB   Patient has appt with Daleen Snook PA 12/21/2019 @ 1030am

## 2019-12-20 NOTE — Telephone Encounter (Signed)
Follow up ° ° ° ° °Please return call to daughter °

## 2019-12-20 NOTE — Progress Notes (Signed)
Cardiology Office Note   Date:  12/21/2019   ID:  [name] Jan 23, 1941, MRN [id number]  PCP:  Patient, No Pcp Per  Cardiologist:  Sanda Klein, MD EP: None  Chief Complaint  Patient presents with  . Follow-up    CHF      History of Present Illness: [name] is a 78 y.o. male with a PMH of CAD s/p CABG, bioprosthetic aortic valve, paroxysmal atrial fibrillation not on anticoagulation 2/2 GI bleed and intraocular bleed, chronic combined CHF s/p CRT, bilateral carotid stenosis s/p bilateral CEA, CKD stage 3, who presents for close follow-up of his CHF.  He was admitted to the hospital from 11/25/2019-11/28/2019 for CHF. Echo that admission revealed EF 15-20% with G3DD. He was diuresed with IV lasix, though management limited by CKD. His weight was 169lbs on the day of discharge. He was unable to be started on entresto or spironolactone due to elevated Cr at discharge. He was seen post-hospitalization 12/12/2019 by myself after his daughter called with complaints of worsening SOB despite increase lasix. His weights were generally stable but still above goal of 170lbs. Unfortunately he was experiencing a gout flare due to increased diuretics. He was recommended to take a dose of metolazone 2.5mg  after a couple days of colchicine.   He returns today for close follow-up of his CHF. His daughter, [name], was called during the exam. He reports improvement in his breathing since he was here 1 week ago. His weights over the past three days were 170>169>171lbs today. He took the metolazone last Wednesday and reported increase in UOP. He denies chest pain, LE edema, dizziness, lightheadedness, syncope, orthopnea, PND, or palpitations. He continues to work on his farm. Daughter reports he did have a mechanical fall yesterday. Thankfully he was not injured. He has also had resolution of his gout flare from last week. Daughter asked about prognosis. Family has noticed a significant  decline in his health over the past 6 months. She is a Merchandiser, retail and states based on his current HF he would qualify for hospice. We discussed that he appeared to be on the other side of his heart failure exacerbation at this time, however would anticipate there will be reoccurrence from time to time. She asks about meeting with Dr. Sallyanne Kuster to discuss goals of care and when it would be appropriate to turn off his ICD.     Past Medical History:  Diagnosis Date  . Anginal pain ([place])   . Anxiety   . Aortic stenosis    a. s/p bioprosthetic AVR in 2010  . Arthritis    gout  . CAD (coronary artery disease)    a. s/p CABG in 1992 b. redo CABG in 2004 c. cath in 2010 showing patent LIMA-LAD, free RIMA-OM, and SVG-OM, and 80% stenosis of small PLA branch  . Chest pain   . CHF (congestive heart failure) ([place])   . Dysrhythmia   . GERD (gastroesophageal reflux disease)    with gas and bloating probably cause of chest pain  . H/O atrial flutter    a. s/p ablation and not on anticoagulation secondary to GIB and alcohol abuse.   . H/O: gout   . Hyperlipidemia   . Hypertension   . Hypertensive retinopathy    OU  . ICD (implantable cardiac defibrillator) infection, s/p removal of ICD 07/12/2012   re-implanted Medtronic BiV ICD, serial number [id number]   . Ischemic cardiomyopathy    a. EF previously 20-25%, improved  to 40-45% by echo in 03/2013 b. 10/2017: echo showing of 30-35% with Grade 2 DD, normal function of AVR, moderate MR.   . Myocardial infarction (HCC)   . 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 Shortness of breath     Past Surgical History:  Procedure Laterality Date  . BI-VENTRICULAR IMPLANTABLE CARDIOVERTER DEFIBRILLATOR Right 08/18/2012   Medtronic BiV ICD, serial number [id number] ; Procedure: BI-VENTRICULAR IMPLANTABLE CARDIOVERTER DEFIBRILLATOR  (CRT-D);  Surgeon: Marinus Maw, MD;  Location: [facility] CATH LAB;  Service: Cardiovascular;   Laterality: Right;  . BIV ICD GENERATOR CHANGEOUT N/A 09/08/2018   Procedure: BIV ICD GENERATOR CHANGEOUT;  Surgeon: Thurmon Fair, MD;  Location: [facility] INVASIVE CV LAB;  Service: Cardiovascular;  Laterality: N/A;  . CARDIAC CATHETERIZATION  Dec 18, 2003   with a presentation of atrial flutter with rapid ventricular response. He had a cath and two stents to his PDA after his SVG to his RCA. His other graft were patent. He had LV with EF of 30%.   . CARDIOVERSION N/A 05/30/2019   Procedure: CARDIOVERSION;  Surgeon: Thurmon Fair, MD;  Location: [facility] ENDOSCOPY;  Service: Cardiovascular;  Laterality: N/A;  . CARDIOVERSION N/A 09/30/2019   Procedure: CARDIOVERSION;  Surgeon: Thurmon Fair, MD;  Location: [facility] ENDOSCOPY;  Service: Cardiovascular;  Laterality: N/A;  . CAROTID ENDARTERECTOMY  11/2002   left in December 2003 and known renal artery stenosis of 60-70%  . CATARACT EXTRACTION Bilateral   . CORONARY ARTERY BYPASS GRAFT  1993   redo in 2000, he has had multiple MI previous to both procedures.  [name] EYE SURGERY Bilateral    Cat Sx OU  . IMPLANTABLE CARDIOVERTER DEFIBRILLATOR (ICD) GENERATOR CHANGE N/A 06/25/2012   Procedure: ICD GENERATOR CHANGE;  Surgeon: Marinus Maw, MD;  [facility];  Service: Cardiovascular;  Laterality: N/A;  . VALVE REPLACEMENT  2010     Current Outpatient Medications  Medication Sig Dispense Refill  . acetaminophen (TYLENOL) 325 MG tablet Take 325-650 mg by mouth every 6 (six) hours as needed for mild pain or headache.    [name] amiodarone (PACERONE) 200 MG tablet Take 1 tablet (200 mg total) by mouth daily. 30 tablet 11  . antiseptic oral rinse (BIOTENE) LIQD 15 mLs by Mouth Rinse route as needed for dry mouth. 473 mL 0  . aspirin EC 81 MG tablet Take 1 tablet (81 mg total) by mouth daily. 90 tablet 3  . atorvastatin (LIPITOR) 10 MG tablet TAKE 1 TABLET BY MOUTH EVERY DAY (Patient taking differently: Take 10 mg by mouth daily. ) 90 tablet 0  . atropine 1 %  ophthalmic solution Place 1 drop into the right eye daily.     . brimonidine (ALPHAGAN) 0.2 % ophthalmic solution Place 1 drop into the right eye 2 (two) times daily. 10 mL 1  . carvedilol (COREG) 6.25 MG tablet Take 1 tablet (6.25 mg total) by mouth 2 (two) times daily with a meal. 60 tablet 11  . colchicine 0.6 MG tablet Take 1 tablet (0.6 mg total) by mouth as needed (gout attacks). 30 tablet 0  . dorzolamide-timolol (COSOPT) 22.3-6.8 MG/ML ophthalmic solution Place 1 drop into the right eye 2 (two) times daily.    . ferrous sulfate 325 (65 FE) MG EC tablet Take 1 tablet (325 mg total) by mouth 2 (two) times daily. 60 tablet 11  . folic acid (FOLVITE) 1 MG tablet Take 1 tablet (1 mg total) by mouth  daily. 30 tablet 11  . furosemide (LASIX) 80 MG tablet Take 1 tablet (80 mg total) by mouth daily. 90 tablet 3  . Menthol, Topical Analgesic, (BENGAY EX) Apply 1 application topically daily as needed (pain).    [name] metolazone (ZAROXOLYN) 2.5 MG tablet Take 1 tablet (2.5 mg) on Wednesday December 14, 2019. 10 tablet 0  . nitroGLYCERIN (NITROSTAT) 0.4 MG SL tablet Place 1 tablet (0.4 mg) under the tongue every 5 minutes as needed for chest pain for up to 3 doses/call 9-1-1 and MD if no relief (Patient taking differently: Place 0.4 mg under the tongue every 5 (five) minutes x 3 doses as needed for chest pain (CALL 9-1-1 AND MD, IF NO RELIEF). ) 25 tablet 0  . potassium chloride SA (KLOR-CON) 20 MEQ tablet Take 1 tablet (20 mEq total) by mouth daily. 90 tablet 3  . prednisoLONE acetate (PRED FORTE) 1 % ophthalmic suspension Place 1 drop into the right eye 4 (four) times daily. 15 mL 1   Current Facility-Administered Medications  Medication Dose Route Frequency Provider Last Rate Last Admin  . sodium chloride flush (NS) 0.9 % injection 3 mL  3 mL Intravenous Q12H Croitoru, Mihai, MD        Allergies:   Atorvastatin, Iron, and Other    Social History:  The patient  reports that he has never smoked. His  smokeless tobacco use includes chew. He reports current alcohol use of about 14.0 standard drinks of alcohol per week. He reports that he does not use drugs.   Family History:  The patient's family history includes CAD in his brother.    ROS:  Please see the history of present illness.   Otherwise, review of systems are positive for none.   All other systems are reviewed and negative.    PHYSICAL EXAM: VS:  BP 128/78   Pulse 76   Temp 97.9 F (36.6 C)   Ht 6\' 1"  (1.854 m)   Wt 174 lb (78.9 kg)   SpO2 98%   BMI 22.96 kg/m  , BMI Body mass index is 22.96 kg/m. GEN: Elderly gentleman sitting on the exam table in NAD HEENT: sclera anicteric Neck: no JVD, carotid bruits, or masses Cardiac: RRR; no murmurs, rubs, or gallops, no edema  Respiratory:  clear to auscultation bilaterally, normal work of breathing GI: soft, nontender, nondistended, + BS MS: no deformity or atrophy Skin: warm and dry, no rash Neuro:  Strength and sensation are intact Psych: euthymic mood, full affect   EKG:  EKG is not ordered today.   Recent Labs: 09/26/2019: ALT 8 11/25/2019: B Natriuretic Peptide 1,948.2 11/28/2019: Magnesium 1.9 12/05/2019: Hemoglobin 9.6; Platelets 271; TSH 6.990 12/12/2019: BUN 23; Creatinine, Ser 1.39; Potassium 3.8; Sodium 139    Lipid Panel    Component Value Date/Time   CHOL 87 (L) 05/05/2019 1402   TRIG 44 05/05/2019 1402   HDL 46 05/05/2019 1402   CHOLHDL 1.9 05/05/2019 1402   CHOLHDL 4.2 12/19/2016 0841   VLDL 15 12/19/2016 0841   LDLCALC 32 05/05/2019 1402      Wt Readings from Last 3 Encounters:  12/21/19 174 lb (78.9 kg)  12/12/19 187 lb (84.8 kg)  11/28/19 169 lb 8.5 oz (76.9 kg)      Other studies Reviewed: Additional studies/ records that were reviewed today include:   Echocardiogram 11/26/2019: 1. Left ventricular ejection fraction, by visual estimation, is <20%. The left ventricle has severely decreased function. There is moderately increased left  ventricular  hypertrophy. 2. Elevated left atrial and left ventricular end-diastolic pressures. 3. Left ventricular diastolic parameters are consistent with Grade III diastolic dysfunction (restrictive). 4. Severely dilated left ventricular internal cavity size. 5. Global right ventricle has moderately reduced systolic function.The right ventricular size is moderately enlarged. No increase in right ventricular wall thickness. 6. Left atrial size was severely dilated. 7. Right atrial size was normal. 8. The mitral valve is normal in structure. Moderate mitral valve regurgitation. No evidence of mitral stenosis. 9. The tricuspid valve is normal in structure. Tricuspid valve regurgitation is not demonstrated. 10. Aortic valve regurgitation is not visualized. No evidence of aortic valve sclerosis or stenosis. 11. Normal gradients across the bioprosthetic valve. 12. The pulmonic valve was normal in structure. Pulmonic valve regurgitation is not visualized. 13. Aneurysm of the ascending aorta, measuring 53 mm. 14. Aortic dilatation noted. 15. Moderately elevated pulmonary artery systolic pressure. 16. The tricuspid regurgitant velocity is 2.50 m/s, and with an assumed right atrial pressure of 15 mmHg, the estimated right ventricular systolic pressure is moderately elevated at 40.0 mmHg. 17. The inferior vena cava is normal in size with greater than 50% respiratory variability, suggesting right atrial pressure of 3 mmHg. 18. There is ascending aortic aneurysm measuring 53 mm (51 mm on the chest CTA on 03/07/2019). 19. When compared to the prior study from 03/07/2018 LVEF has decreased from 30-35% to 15-20%. RVEF is moderately decreased. There are normal transoartic gradients. Moderate mitral regurgitation.     ASSESSMENT AND PLAN:  1. Acute on chronic combined CHF: recent hospitalization with EF 15-20% and G3DD. CKD and history of hypotension has limited GDT. Weight is 174 today with weights  at home over the past 3 days 170>169>171lbs today. No ICD firings. He has been taking lasix  daily and did take metolazone 2.5mg  12/23 as instructed. He has had improvement in his breathing since that time. He appears euvolemic on exam today - Will check BMET today to monitor kidney function. - Continue carvedilol - Unable to add entresto/spironolactone due to CKD - Continue lasix  daily with instructions to take metolazone if weight gain of 3lbs in 1 day or 5lbs in 1 week (limit one dose per week) - Will arrange follow-up with Dr. Royann Shivers for close monitoring of his HF and GOC discussion  2. Paroxysmal atrial fibrillation: not on anticoagulation due to history of GI bleed and intraocular bleeding.  - Continue amiodarone for rhythm control - Continue carvedilol for rate control  3. CAD s/p CABG: no anginal complaints.  - Continue aspirin and statin - Continue carvedilol  4. HTN: BP 128/78 today - Continue carvedilol  5. HLD: LDL 32 04/2019; at goal of <16  - Continue statin  6. Carotid artery disease: s/p bilateral CEA - Continue aspirin and statin  7. Ascending aortic aneurysm: 5.3cm on echo 11/25/2019. Not a candidate for 3rd thoracotomy, therefore no further routine monitor justified - Continue BP management as above  8. CKD stage 3: Cr 1.39 12/12/2019 - Will check BMET today for close monitoring  9. Chronic amiodarone therapy: TSH elevated, though FT4 wnl. Has been uptrending over the past few months.  - Continue to monitor periodically with amiodarone use.   10. Gout flare: resolved from last week - Continue colchicine as needed    Current medicines are reviewed at length with the patient today.  The patient does not have concerns regarding medicines.  The following changes have been made:  no change  Labs/ tests ordered today include:  Orders Placed This Encounter  Procedures  . Basic Metabolic Panel (BMET)     Disposition:   FU with Dr.  Royann Shiversroitoru 01/29/2019   Signed, Beatriz StallionKrista M. Caidence Kaseman, PA-C  12/21/2019 12:52 PM

## 2019-12-20 NOTE — Telephone Encounter (Signed)
Spoke with daughter Eustaquio Maize. She reports patient is "declining in his health". He reports Daleen Snook told him that his heart went from a "3 to a 1" - she was unsure what this meant. Advised that his HF status was discussed.   Eustaquio Maize is a Merchandiser, retail. She is concerned about his defib continuing to shock him in the even something else should happen.   She would like [place] to call her after patient's visit tomorrow or if able to call her during the visit with patient, she is OK with that. Patient is has a flip phone, so she is not sure the capabilities (i.e speakerphone)

## 2019-12-21 ENCOUNTER — Ambulatory Visit: Payer: Medicare Other | Admitting: Medical

## 2019-12-21 ENCOUNTER — Telehealth: Payer: Self-pay | Admitting: *Deleted

## 2019-12-21 ENCOUNTER — Other Ambulatory Visit: Payer: Self-pay

## 2019-12-21 ENCOUNTER — Encounter: Payer: Self-pay | Admitting: Medical

## 2019-12-21 VITALS — BP 128/78 | HR 76 | Temp 97.9°F | Ht 73.0 in | Wt 174.0 lb

## 2019-12-21 DIAGNOSIS — I7121 Aneurysm of the ascending aorta, without rupture: Secondary | ICD-10-CM

## 2019-12-21 DIAGNOSIS — I25810 Atherosclerosis of coronary artery bypass graft(s) without angina pectoris: Secondary | ICD-10-CM | POA: Diagnosis not present

## 2019-12-21 DIAGNOSIS — N1831 Chronic kidney disease, stage 3a: Secondary | ICD-10-CM

## 2019-12-21 DIAGNOSIS — I712 Thoracic aortic aneurysm, without rupture: Secondary | ICD-10-CM

## 2019-12-21 DIAGNOSIS — Z9889 Other specified postprocedural states: Secondary | ICD-10-CM

## 2019-12-21 DIAGNOSIS — I10 Essential (primary) hypertension: Secondary | ICD-10-CM

## 2019-12-21 DIAGNOSIS — E7800 Pure hypercholesterolemia, unspecified: Secondary | ICD-10-CM

## 2019-12-21 DIAGNOSIS — I5042 Chronic combined systolic (congestive) and diastolic (congestive) heart failure: Secondary | ICD-10-CM | POA: Diagnosis not present

## 2019-12-21 DIAGNOSIS — I480 Paroxysmal atrial fibrillation: Secondary | ICD-10-CM

## 2019-12-21 DIAGNOSIS — Z95810 Presence of automatic (implantable) cardiac defibrillator: Secondary | ICD-10-CM

## 2019-12-21 LAB — BASIC METABOLIC PANEL
BUN/Creatinine Ratio: 21 (ref 10–24)
BUN: 30 mg/dL — ABNORMAL HIGH (ref 8–27)
CO2: 27 mmol/L (ref 20–29)
Calcium: 9.7 mg/dL (ref 8.6–10.2)
Chloride: 94 mmol/L — ABNORMAL LOW (ref 96–106)
Creatinine, Ser: 1.44 mg/dL — ABNORMAL HIGH (ref 0.76–1.27)
GFR calc Af Amer: 54 mL/min/{1.73_m2} — ABNORMAL LOW (ref >59)
GFR calc non Af Amer: 47 mL/min/{1.73_m2} — ABNORMAL LOW (ref >59)
Glucose: 85 mg/dL (ref 65–99)
Potassium: 3.4 mmol/L — ABNORMAL LOW (ref 3.5–5.2)
Sodium: 137 mmol/L (ref 134–144)

## 2019-12-21 MED ORDER — POTASSIUM CHLORIDE CRYS ER 20 MEQ PO TBCR: 30.0000 meq | EXTENDED_RELEASE_TABLET | Freq: Every day | ORAL | 1 refills | Status: DC

## 2019-12-21 NOTE — Telephone Encounter (Signed)
-----   Message from Abigail Butts, PA-C sent at 12/21/2019  4:47 PM EST ----- Please notify the patient this his kidney function is stable. His potassium level is a little bit low on labs today. Recommend he take an additional 2 tablets of potassium tonight, then start 1.5 tablet daily dosing (30 mEq total) going forward. We will check in on his potassium level at his follow-up visit with Dr. Loletha Grayer at his next visit. Thank you!

## 2019-12-21 NOTE — Telephone Encounter (Signed)
The patient and his daughter have been made aware. The patient requested that the daughter be made aware of all changes now. He will need to fill out a DPR at his next office visit.

## 2019-12-21 NOTE — Patient Instructions (Signed)
Medication Instructions: No changes at this time. For weight gain of 3 lbs in a day to 5 lbs in a week you can take 1 additional dose per week (1 tablet) of Metazalone, in addition to 1 extra Potassium tablet.   *If you need a refill on your cardiac medications before your next appointment, please call your pharmacy*  Lab Work: Your physician recommends that you have labs drawn today.   If you have labs (blood work) drawn today and your tests are completely normal, you will receive your results only by: [name] MyChart Message (if you have MyChart) OR . A paper copy in the mail If you have any lab test that is abnormal or we need to change your treatment, we will call you to review the results.  Testing/Procedures: none  Follow-Up: At [facility], you and your health needs are our priority.  As part of our continuing mission to provide you with exceptional heart care, we have created designated Provider Care Teams.  These Care Teams include your primary Cardiologist (physician) and Advanced Practice Providers (APPs -  Physician Assistants and Nurse Practitioners) who all work together to provide you with the care you need, when you need it.  Your next appointment:   1 month(s)  The format for your next appointment:   Either In Person or Virtual  Provider:   Sanda Klein, MD  Other Instructions  Decrease your sodium intake, follow the salty 6 recommendations   You will have a follow -up appt. on 01/30/20, with Dr. Sallyanne Kuster.

## 2019-12-26 ENCOUNTER — Telehealth: Payer: Self-pay | Admitting: Cardiovascular Disease

## 2019-12-26 NOTE — Telephone Encounter (Signed)
Pt c/o Shortness Of Breath: STAT if SOB developed within the last 24 hours or pt is noticeably SOB on the phone  1. Are you currently SOB (can you hear that pt is SOB on the phone)? N/A 2. How long have you been experiencing SOB? Over the past few days  3. Are you SOB when sitting or when up moving around? Mainly when sitting  4. Are you currently experiencing any other symptoms? no

## 2019-12-26 NOTE — Telephone Encounter (Signed)
Spoke with the daughter per the patient request.   The daughter would like a sooner appointemnt for the patient, per the patient request. The daughter and the patient have discussed turning off his defibrillator, making the patient a DNR and getting hospice involved.  An appointment has been added for 1/5 with Dr. Royann Shivers.

## 2019-12-27 ENCOUNTER — Ambulatory Visit (INDEPENDENT_AMBULATORY_CARE_PROVIDER_SITE_OTHER): Payer: Medicare Other | Admitting: Cardiovascular Disease

## 2019-12-27 ENCOUNTER — Other Ambulatory Visit: Payer: Self-pay

## 2019-12-27 VITALS — BP 108/60 | HR 84 | Ht 73.0 in | Wt 173.0 lb

## 2019-12-27 DIAGNOSIS — D500 Iron deficiency anemia secondary to blood loss (chronic): Secondary | ICD-10-CM

## 2019-12-27 DIAGNOSIS — I712 Thoracic aortic aneurysm, without rupture: Secondary | ICD-10-CM

## 2019-12-27 DIAGNOSIS — Z95810 Presence of automatic (implantable) cardiac defibrillator: Secondary | ICD-10-CM

## 2019-12-27 DIAGNOSIS — E7800 Pure hypercholesterolemia, unspecified: Secondary | ICD-10-CM

## 2019-12-27 DIAGNOSIS — I5043 Acute on chronic combined systolic (congestive) and diastolic (congestive) heart failure: Secondary | ICD-10-CM | POA: Diagnosis not present

## 2019-12-27 DIAGNOSIS — E039 Hypothyroidism, unspecified: Secondary | ICD-10-CM

## 2019-12-27 DIAGNOSIS — I25810 Atherosclerosis of coronary artery bypass graft(s) without angina pectoris: Secondary | ICD-10-CM

## 2019-12-27 DIAGNOSIS — Z5181 Encounter for therapeutic drug level monitoring: Secondary | ICD-10-CM

## 2019-12-27 DIAGNOSIS — Z7189 Other specified counseling: Secondary | ICD-10-CM

## 2019-12-27 DIAGNOSIS — I10 Essential (primary) hypertension: Secondary | ICD-10-CM

## 2019-12-27 DIAGNOSIS — I6523 Occlusion and stenosis of bilateral carotid arteries: Secondary | ICD-10-CM

## 2019-12-27 DIAGNOSIS — I7121 Aneurysm of the ascending aorta, without rupture: Secondary | ICD-10-CM

## 2019-12-27 DIAGNOSIS — I4819 Other persistent atrial fibrillation: Secondary | ICD-10-CM

## 2019-12-27 DIAGNOSIS — Z953 Presence of xenogenic heart valve: Secondary | ICD-10-CM

## 2019-12-27 DIAGNOSIS — N1832 Chronic kidney disease, stage 3b: Secondary | ICD-10-CM

## 2019-12-27 DIAGNOSIS — M1A09X0 Idiopathic chronic gout, multiple sites, without tophus (tophi): Secondary | ICD-10-CM

## 2019-12-27 DIAGNOSIS — Z79899 Other long term (current) drug therapy: Secondary | ICD-10-CM

## 2019-12-27 MED ORDER — APIXABAN 5 MG PO TABS: 5.0000 mg | ORAL_TABLET | Freq: Two times a day (BID) | ORAL | 11 refills | Status: DC

## 2019-12-27 MED ORDER — FERROUS SULFATE 325 (65 FE) MG PO TBEC: 325.0000 mg | DELAYED_RELEASE_TABLET | Freq: Two times a day (BID) | ORAL | 11 refills | Status: DC

## 2019-12-27 MED ORDER — FOLIC ACID 1 MG PO TABS: 1.0000 mg | ORAL_TABLET | Freq: Every day | ORAL | 11 refills | Status: DC

## 2019-12-27 MED ORDER — POTASSIUM CHLORIDE CRYS ER 20 MEQ PO TBCR: 30.0000 meq | EXTENDED_RELEASE_TABLET | Freq: Every day | ORAL | 11 refills | Status: DC

## 2019-12-27 MED ORDER — CARVEDILOL 6.25 MG PO TABS: 6.2500 mg | ORAL_TABLET | Freq: Two times a day (BID) | ORAL | 11 refills | Status: AC

## 2019-12-27 MED ORDER — AMIODARONE HCL 400 MG PO TABS: 400.0000 mg | ORAL_TABLET | Freq: Every day | ORAL | 11 refills | Status: DC

## 2019-12-27 MED ORDER — FUROSEMIDE 80 MG PO TABS: 80.0000 mg | ORAL_TABLET | Freq: Every day | ORAL | 11 refills | Status: DC

## 2019-12-27 NOTE — Patient Instructions (Signed)
Medication Instructions:  INCREASE Amiodarone 400 mg once daily START Eliquis 5 mg twice daily  *If you need a refill on your cardiac medications before your next appointment, please call your pharmacy*  Lab Work: Labs today: BMET and CBC If you have labs (blood work) drawn today and your tests are completely normal, you will receive your results only by: [name] MyChart Message (if you have MyChart) OR . A paper copy in the mail If you have any lab test that is abnormal or we need to change your treatment, we will call you to review the results.  Testing/Procedures: None ordered  Follow-Up: At [facility], you and your health needs are our priority.  As part of our continuing mission to provide you with exceptional heart care, we have created designated Provider Care Teams.  These Care Teams include your primary Cardiologist (physician) and Advanced Practice Providers (APPs -  Physician Assistants and Nurse Practitioners) who all work together to provide you with the care you need, when you need it.  Your next appointment:   4 week(s)  The format for your next appointment:   In Person  Provider:   Thurmon Fair, MD

## 2019-12-28 ENCOUNTER — Ambulatory Visit (INDEPENDENT_AMBULATORY_CARE_PROVIDER_SITE_OTHER): Payer: Medicare Other | Admitting: *Deleted

## 2019-12-28 ENCOUNTER — Encounter: Payer: Self-pay | Admitting: *Deleted

## 2019-12-28 ENCOUNTER — Telehealth: Payer: Self-pay

## 2019-12-28 DIAGNOSIS — I5043 Acute on chronic combined systolic (congestive) and diastolic (congestive) heart failure: Secondary | ICD-10-CM | POA: Diagnosis not present

## 2019-12-28 LAB — BASIC METABOLIC PANEL
BUN/Creatinine Ratio: 19 (ref 10–24)
BUN: 28 mg/dL — ABNORMAL HIGH (ref 8–27)
CO2: 26 mmol/L (ref 20–29)
Calcium: 9.5 mg/dL (ref 8.6–10.2)
Chloride: 98 mmol/L (ref 96–106)
Creatinine, Ser: 1.51 mg/dL — ABNORMAL HIGH (ref 0.76–1.27)
GFR calc Af Amer: 51 mL/min/{1.73_m2} — ABNORMAL LOW (ref >59)
GFR calc non Af Amer: 44 mL/min/{1.73_m2} — ABNORMAL LOW (ref >59)
Glucose: 124 mg/dL — ABNORMAL HIGH (ref 65–99)
Potassium: 4.6 mmol/L (ref 3.5–5.2)
Sodium: 137 mmol/L (ref 134–144)

## 2019-12-28 LAB — CUP PACEART REMOTE DEVICE CHECK
Battery Remaining Longevity: 70 mo
Battery Voltage: 2.92 V
Brady Statistic AP VP Percent: 1.17 %
Brady Statistic AP VS Percent: 0 %
Brady Statistic AS VP Percent: 97.1 %
Brady Statistic AS VS Percent: 1.73 %
Brady Statistic RA Percent Paced: 1.14 %
Brady Statistic RV Percent Paced: 98.52 %
Date Time Interrogation Session: 20210106104302
HighPow Impedance: 64 Ohm
Implantable Lead Implant Date: 20130828
Implantable Lead Implant Date: 20130828
Implantable Lead Implant Date: 20130828
Implantable Lead Location: 753858
Implantable Lead Location: 753859
Implantable Lead Location: 753860
Implantable Lead Model: 4194
Implantable Lead Model: 5076
Implantable Lead Model: 6935
Implantable Pulse Generator Implant Date: 20190918
Lead Channel Impedance Value: 228 Ohm
Lead Channel Impedance Value: 285 Ohm
Lead Channel Impedance Value: 285 Ohm
Lead Channel Impedance Value: 456 Ohm
Lead Channel Impedance Value: 513 Ohm
Lead Channel Impedance Value: 665 Ohm
Lead Channel Pacing Threshold Amplitude: 0.75 V
Lead Channel Pacing Threshold Amplitude: 0.75 V
Lead Channel Pacing Threshold Amplitude: 0.875 V
Lead Channel Pacing Threshold Pulse Width: 0.4 ms
Lead Channel Pacing Threshold Pulse Width: 0.4 ms
Lead Channel Pacing Threshold Pulse Width: 0.4 ms
Lead Channel Sensing Intrinsic Amplitude: 0.375 mV
Lead Channel Sensing Intrinsic Amplitude: 0.375 mV
Lead Channel Sensing Intrinsic Amplitude: 2.5 mV
Lead Channel Sensing Intrinsic Amplitude: 2.5 mV
Lead Channel Setting Pacing Amplitude: 1.5 V
Lead Channel Setting Pacing Amplitude: 1.5 V
Lead Channel Setting Pacing Amplitude: 2.5 V
Lead Channel Setting Pacing Pulse Width: 0.4 ms
Lead Channel Setting Pacing Pulse Width: 0.4 ms
Lead Channel Setting Sensing Sensitivity: 0.3 mV

## 2019-12-28 LAB — CBC
Hematocrit: 33.3 % — ABNORMAL LOW (ref 37.5–51.0)
Hemoglobin: 10.1 g/dL — ABNORMAL LOW (ref 13.0–17.7)
MCH: 25.1 pg — ABNORMAL LOW (ref 26.6–33.0)
MCHC: 30.3 g/dL — ABNORMAL LOW (ref 31.5–35.7)
MCV: 83 fL (ref 79–97)
Platelets: 272 10*3/uL (ref 150–450)
RBC: 4.03 x10E6/uL — ABNORMAL LOW (ref 4.14–5.80)
RDW: 15.7 % — ABNORMAL HIGH (ref 11.6–15.4)
WBC: 5.6 10*3/uL (ref 3.4–10.8)

## 2019-12-28 NOTE — Progress Notes (Signed)
Cardiology Office Note:    Date:  12/28/2019   ID:  [name], DOB 06-02-1941, MRN [id number]  PCP:  Patient, No Pcp Per  Cardiologist:  Thurmon Fair, MD  Electrophysiologist:  None   Referring MD: No ref. provider found   chief complaint: Disc  History of Present Illness:    [name] is a 79 y.o. male with a hx of coronary artery disease with redo bypass surgery 2004, aortic valve replacement with biological prosthesis, severe ischemic cardiomyopathy with combined systolic and diastolic heart failure, CRT-D, bilateral carotid stenosis with bilateral endarterectomy in the remote past.  On several occasions he has had heart failure exacerbation related to persistent atrial fibrillation.  It generally occurs after he has been in the arrhythmia for at least a couple of weeks and is partly related to loss of CRT pacing.  We have performed successful cardioversion (most recently in October), each time with clinical improvement, but anticoagulation  had to be interrupted once because of severe GI bleeding and once because of intraocular bleeding (most recently).  He is still on amiodarone.  Interrogation of his device today shows that he went back in atrial fibrillation 16 days ago, on December 20.  Clinical status has deteriorated since then. He still has >99% BiV pacing and no episodes of high ventricular rate. He feels very tired and has dyspnea with light activity.  He denies chest pain, orthopnea, PND.  He has not had dizziness or syncope and does not have lower extremity edema.  His device actually shows that his thoracic impedance is at baseline, without evidence of intrathoracic hypervolemia.  On physical exam he does not have jugular venous distention but does have prompt hepatojugular reflux.  Lungs are clear.  His echocardiogram performed December 5 shows EF less than 20%, signs of elevated filling pressures with a restrictive mitral inflow, severely dilated left  ventricle and severely dilated left atrium, moderate mitral insufficiency, mild PAH. 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  He has been talking about goals of care with his daughter, who is a Therapist, music.  They have decided to seek a palliative care approach and he wants to have DNR status.  He specifically requested that we turn off defibrillator therapies today.  We discussed the benefit of continued CRT-P and efforts to improve quality of life, which might even include one more attempt at DCCV, since AFib clearly is repeatedly responsible for acute exacerbations of CHF, even though he is well rate-controlled and has preserved CRT.  Past Medical History:  Diagnosis Date   Anginal pain (HCC)    Anxiety    Aortic stenosis    a. s/p bioprosthetic AVR in 2010   Arthritis    gout   CAD (coronary artery disease)    a. s/p CABG in 1992 b. redo CABG in 2004 c. cath in 2010 showing patent LIMA-LAD, free RIMA-OM, and SVG-OM, and 80% stenosis of small PLA branch   Chest pain    CHF (congestive heart failure) (HCC)    Dysrhythmia    GERD (gastroesophageal reflux disease)    with gas and bloating probably cause of chest pain   H/O atrial flutter    a. s/p ablation and not on anticoagulation secondary to GIB and alcohol abuse.    H/O: gout    Hyperlipidemia    Hypertension    Hypertensive retinopathy    OU   ICD (implantable cardiac defibrillator) infection, s/p removal of ICD 07/12/2012  re-implanted Medtronic BiV ICD, serial number [id number]    Ischemic cardiomyopathy    a. EF previously 20-25%, improved to 40-45% by echo in 03/2013 b. 10/2017: echo showing of 30-35% with Grade 2 DD, normal function of AVR, moderate MR.    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   Shortness of breath     Past Surgical History:  Procedure Laterality Date   BI-VENTRICULAR IMPLANTABLE  CARDIOVERTER DEFIBRILLATOR Right 08/18/2012   Medtronic BiV ICD, serial number [id number] ; Procedure: BI-VENTRICULAR IMPLANTABLE CARDIOVERTER DEFIBRILLATOR  (CRT-D);  Surgeon: Marinus MawGregg W Taylor, MD;  [facility];  Service: Cardiovascular;  Laterality: Right;   BIV ICD GENERATOR CHANGEOUT N/A 09/08/2018   Procedure: BIV ICD GENERATOR CHANGEOUT;  Surgeon: Thurmon Fairroitoru, Vallie Teters, MD;  Location: [facility] INVASIVE CV LAB;  Service: Cardiovascular;  Laterality: N/A;   CARDIAC CATHETERIZATION  Dec 18, 2003   with a presentation of atrial flutter with rapid ventricular response. He had a cath and two stents to his PDA after his SVG to his RCA. His other graft were patent. He had LV with EF of 30%.    CARDIOVERSION N/A 05/30/2019   Procedure: CARDIOVERSION;  Surgeon: Thurmon Fairroitoru, Carmel Garfield, MD;  Location: [facility] ENDOSCOPY;  Service: Cardiovascular;  Laterality: N/A;   CARDIOVERSION N/A 09/30/2019   Procedure: CARDIOVERSION;  Surgeon: Thurmon Fairroitoru, Wash Nienhaus, MD;  Location: [facility] ENDOSCOPY;  Service: Cardiovascular;  Laterality: N/A;   CAROTID ENDARTERECTOMY  11/2002   left in December 2003 and known renal artery stenosis of 60-70%   CATARACT EXTRACTION Bilateral    CORONARY ARTERY BYPASS GRAFT  1993   redo in 2000, he has had multiple MI previous to both procedures.   EYE SURGERY Bilateral    Cat Sx OU   IMPLANTABLE CARDIOVERTER DEFIBRILLATOR (ICD) GENERATOR CHANGE N/A 06/25/2012   Procedure: ICD GENERATOR CHANGE;  Surgeon: Marinus MawGregg W Taylor, MD;  [facility];  Service: Cardiovascular;  Laterality: N/A;   VALVE REPLACEMENT  2010    Current Medications: No outpatient medications have been marked as taking for the 12/27/19 encounter (Office Visit) with Thurmon Fairroitoru, Kyrsten Deleeuw, MD.   Current Facility-Administered Medications for the 12/27/19 encounter (Office Visit) with Bristol Osentoski, Rachelle HoraMihai, MD  Medication   sodium chloride flush (NS) 0.9 % injection 3 mL     Allergies:   Atorvastatin, Iron, and Other   Social History    Socioeconomic History   Marital status: Married    Spouse name: Not on file   Number of children: Not on file   Years of education: Not on file   Highest education level: Not on file  Occupational History   Not on file  Tobacco Use   Smoking status: Never Smoker   Smokeless tobacco: Current User    Types: Chew  Substance and Sexual Activity   Alcohol use: Yes    Alcohol/week: 14.0 standard drinks    Types: 14 Cans of beer per week   Drug use: No   Sexual activity: Not on file  Other Topics Concern   Not on file  Social History Narrative   Not on file   Social Determinants of Health   Financial Resource Strain:    Difficulty of Paying Living Expenses: Not on file  Food Insecurity:    Worried About Running Out of Food in the Last Year: Not on file   Ran Out of Food in the Last Year: Not on file  Transportation Needs:  Lack of Transportation (Medical): Not on file   Lack of Transportation (Non-Medical): Not on file  Physical Activity:    Days of Exercise per Week: Not on file   Minutes of Exercise per Session: Not on file  Stress:    Feeling of Stress : Not on file  Social Connections:    Frequency of Communication with Friends and Family: Not on file   Frequency of Social Gatherings with Friends and Family: Not on file   Attends Religious Services: Not on file   Active Member of Clubs or Organizations: Not on file   Attends Banker Meetings: Not on file   Marital Status: Not on file     Family History: The patient's family history includes CAD in his brother.  ROS:   Please see the history of present illness.  All other systems are reviewed and are negative.  EKGs/Labs/Other Studies Reviewed:    The following studies were reviewed today: Comprehensive pacemaker download today  EKG:  EKG is not ordered today.  Intracardiac electrogram shows AFib with 99% BiV pacing.  Recent Labs: 09/26/2019: ALT 8 11/25/2019: B  Natriuretic Peptide 1,948.2 11/28/2019: Magnesium 1.9 12/05/2019: TSH 6.990 12/27/2019: BUN 28; Creatinine, Ser 1.51; Hemoglobin 10.1; Platelets 272; Potassium 4.6; Sodium 137  Recent Lipid Panel    Component Value Date/Time   CHOL 87 (L) 05/05/2019 1402   TRIG 44 05/05/2019 1402   HDL 46 05/05/2019 1402   CHOLHDL 1.9 05/05/2019 1402   CHOLHDL 4.2 12/19/2016 0841   VLDL 15 12/19/2016 0841   LDLCALC 32 05/05/2019 1402    Physical Exam:    VS:  BP 108/60    Pulse 84    Ht 6\' 1"  (1.854 m)    Wt 173 lb (78.5 kg)    SpO2 94%    BMI 22.82 kg/m     Wt Readings from Last 3 Encounters:  12/27/19 173 lb (78.5 kg)  12/21/19 174 lb (78.9 kg)  12/12/19 187 lb (84.8 kg)     General: Alert, oriented x3, no distress, appears frail Head: no evidence of trauma, PERRL, EOMI, no exophtalmos or lid lag, no myxedema, no xanthelasma; normal ears, nose and oropharynx Neck: normal jugular venous pulsations , but has prompt hepatojugular reflux; brisk carotid pulses without delay and no carotid bruits Chest: clear to auscultation, no signs of consolidation by percussion or palpation, normal fremitus, symmetrical and full respiratory excursions Cardiovascular: normal position and quality of the apical impulse, regular rhythm, normal first and paradoxically split second heart sounds, 1/6 aortic ejection murmur, no diastolic murmurs, rubs or gallops Abdomen: no tenderness or distention, no masses by palpation, no abnormal pulsatility or arterial bruits, normal bowel sounds, no hepatosplenomegaly Extremities: no clubbing, cyanosis or edema; 2+ radial, ulnar and brachial pulses bilaterally; 2+ right femoral, posterior tibial and dorsalis pedis pulses; 2+ left femoral, posterior tibial and dorsalis pedis pulses; no subclavian or femoral bruits Neurological: grossly nonfocal Psych: Normal mood and affect    ASSESSMENT:    1. PAF (paroxysmal atrial fibrillation) (HCC)    PLAN:    In order of problems listed  above:  1. Goals of care discussion: Does not want any invasive procedures and wants to turn the ICD off. 12/14/19 and his daughter have decided to pursue palliative care/hospice. DNR paperwork filled out today. Willing to give one more attempt return to normal rhythm. If he tolerates anticoagulation and higher amiodarone dose will plan  DCCV in 3-4 weeks (but will have to rescind hospice  status to do that). If it does not work, if it does not lead to clinical improvement, will go to full hospice care. 2. CHF: NYHA functional class III, despite overall absence of overt hypervolemia. Weight is at baseline (170-174 lb), Optivol not abnormal, clear lungs and no edema.  As before, he improved in normal rhythm, deteriorated after going into AFib.  Appears euvolemic clinically and by OptiVol.  LVEF now <20%. 3. AFib: anticoagulants stopped due to intraocular bleeding. Has very high embolic risk and unprotected cardioversion is not safe. Restart Eliquis and double amiodarone dose. Bring back in 3-4 weeks. If no bleeding complications, can try one final time to improve clinical status with cardioversion. 4. CAD s/p redo CABG:  No angina.  He is taking statin.  Roe Coombs and his daughter do not want invasive procedures. (cath in 2010 showing patent LIMA-LAD, free RIMA-OM, and SVG-OM, and 80% stenosis of small PLA branch). 5. CRT-D:  Excellent biventricular pacing despite AFib.  ECG with +ve R in V1 consistent with good LV capture. OptiVol at baseline. Turned off VT/VF detection and therapies today. Continue pacing therapy. 6. HLP:  on statin LDL at target. 7. HTN:  Adequate control. Has not tolerated RAAS inhibitors. 8. Carotid diseases/p bilateral carotid endarterectomies.  No focal neurological events. 9. S/P AVR: echo shows normal bioprosthetic valve function. 10. Ascending aortic aneurysm:slight increase in size at 5.3 cm by echo. 5.0 cm by CT angiogram of the chest in June 2018, 5.1 cm in March 2019. He is not  a candidate for 3rd thoracotomy therefore routine monitoring does not appear to be justified. 11. CKD 3: creatinine 1.5 close to baseline.. 12. Gout:  Had a recent gout attack when he took metolazone "booster". Now resolved. 13. Fe def anemia:Iron studies were still not fully replenished by labs from early October.  Hgb improving at 10.1. Anemia likely to worsen when we restart anticoagulation. Continue Fe and folate supplements. 14. Hypothyroidism:Borderline elevated TSH level.  Might be amiodarone related.  We will recheck TSH and free T4 in a few months.   15. Amiodarone:  Dose increased to 400 mg daily. 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 Asymptomatic.  We will bring him back for elective direct-current cardioversion after 3 weeks of oral anticoagulation. This procedure has been fully reviewed with the patient and written informed consent has been obtained.   Medication Adjustments/Labs and Tests Ordered: Current medicines are reviewed at length with the patient today.  Concerns regarding medicines are outlined above.  Orders Placed This Encounter  Procedures   Basic metabolic panel   CBC   Meds ordered this encounter  Medications   amiodarone (PACERONE) 400 MG tablet    Sig: Take 1 tablet (400 mg total) by mouth daily.    Dispense:  30 tablet    Refill:  11   carvedilol (COREG) 6.25 MG tablet    Sig: Take 1 tablet (6.25 mg total) by mouth 2 (two) times daily with a meal.    Dispense:  60 tablet    Refill:  11   furosemide (LASIX) 80 MG tablet    Sig: Take 1 tablet (80 mg total) by mouth daily.    Dispense:  30 tablet    Refill:  11   apixaban (ELIQUIS) 5 MG TABS tablet    Sig: Take 1 tablet (5 mg total) by mouth 2 (two) times daily.    Dispense:  60 tablet    Refill:  11   potassium chloride SA (KLOR-CON) 20 MEQ tablet    Sig: Take 1.5 tablets (30 mEq total) by mouth  daily.    Dispense:  45 tablet    Refill:  11   ferrous sulfate 325 (65 FE) MG EC tablet    Sig: Take 1 tablet (325 mg total) by mouth 2 (two) times daily.    Dispense:  60 tablet    Refill:  11   folic acid (FOLVITE) 1 MG tablet    Sig: Take 1 tablet (1 mg total) by mouth daily.    Dispense:  30 tablet    Refill:  11    Patient Instructions  Medication Instructions:  INCREASE Amiodarone 400 mg once daily START Eliquis 5 mg twice daily  *If you need a refill on your cardiac medications before your next appointment, please call your pharmacy*  Lab Work: Labs today: BMET and CBC If you have labs (blood work) drawn today and your tests are completely normal, you will receive your results only by:  [place] (if you have MyChart) OR  A paper copy in the mail If you have any lab test that is abnormal or we need to change your treatment, we will call you to review the results.  Testing/Procedures: None ordered  Follow-Up: At [facility], you and your health needs are our priority.  As part of our continuing mission to provide you with exceptional heart care, we have created designated Provider Care Teams.  These Care Teams include your primary Cardiologist (physician) and Advanced Practice Providers (APPs -  Physician Assistants and Nurse Practitioners) who all work together to provide you with the care you need, when you need it.  Your next appointment:   4 week(s)  The format for your next appointment:   In Person  Provider:   Sanda Klein, MD       Signed, Sanda Klein, MD  12/28/2019 2:35 PM    [place]

## 2019-12-28 NOTE — Telephone Encounter (Signed)
The pt daughter Waynetta Sandy states Dr. Royann Shivers turned something off on the ICD and now it is beeping. I told them I will have the nurse call them back.

## 2019-12-28 NOTE — Telephone Encounter (Signed)
Sorry, my mistake.

## 2019-12-28 NOTE — Telephone Encounter (Signed)
Appointment was scheduled for tomorrow to disable alarm on ICD.

## 2019-12-28 NOTE — Telephone Encounter (Signed)
Patient wants DNR status and I disable VT detection and therapies.

## 2019-12-28 NOTE — Telephone Encounter (Signed)
Alarm on the device signalling VT detection was disabled at 12/27/19 at appointment with Dr Rubie Maid. Patient will have to come into device clinic to have alarm programmed off. 1 family member may accompany patient due to issues with ambulation.

## 2019-12-29 ENCOUNTER — Ambulatory Visit (INDEPENDENT_AMBULATORY_CARE_PROVIDER_SITE_OTHER): Payer: Medicare Other | Admitting: *Deleted

## 2019-12-29 ENCOUNTER — Other Ambulatory Visit: Payer: Self-pay

## 2019-12-29 DIAGNOSIS — I5043 Acute on chronic combined systolic (congestive) and diastolic (congestive) heart failure: Secondary | ICD-10-CM

## 2020-01-10 LAB — CUP PACEART INCLINIC DEVICE CHECK
Battery Remaining Longevity: 71 mo
Battery Voltage: 2.93 V
Brady Statistic AP VP Percent: 0.9 %
Brady Statistic AP VS Percent: 0 %
Brady Statistic AS VP Percent: 97.68 %
Brady Statistic AS VS Percent: 1.42 %
Brady Statistic RA Percent Paced: 0.88 %
Brady Statistic RV Percent Paced: 98.78 %
Date Time Interrogation Session: 20210107151000
HighPow Impedance: 65 Ohm
Implantable Lead Implant Date: 20130828
Implantable Lead Implant Date: 20130828
Implantable Lead Implant Date: 20130828
Implantable Lead Location: 753858
Implantable Lead Location: 753859
Implantable Lead Location: 753860
Implantable Lead Model: 4194
Implantable Lead Model: 5076
Implantable Lead Model: 6935
Implantable Pulse Generator Implant Date: 20190918
Lead Channel Impedance Value: 228 Ohm
Lead Channel Impedance Value: 285 Ohm
Lead Channel Impedance Value: 304 Ohm
Lead Channel Impedance Value: 418 Ohm
Lead Channel Impedance Value: 532 Ohm
Lead Channel Impedance Value: 703 Ohm
Lead Channel Pacing Threshold Amplitude: 0.75 V
Lead Channel Pacing Threshold Amplitude: 0.75 V
Lead Channel Pacing Threshold Amplitude: 0.875 V
Lead Channel Pacing Threshold Pulse Width: 0.4 ms
Lead Channel Pacing Threshold Pulse Width: 0.4 ms
Lead Channel Pacing Threshold Pulse Width: 0.4 ms
Lead Channel Sensing Intrinsic Amplitude: 0.375 mV
Lead Channel Sensing Intrinsic Amplitude: 0.375 mV
Lead Channel Sensing Intrinsic Amplitude: 2.5 mV
Lead Channel Sensing Intrinsic Amplitude: 2.5 mV
Lead Channel Setting Pacing Amplitude: 1.5 V
Lead Channel Setting Pacing Amplitude: 1.5 V
Lead Channel Setting Pacing Amplitude: 2.5 V
Lead Channel Setting Pacing Pulse Width: 0.4 ms
Lead Channel Setting Pacing Pulse Width: 0.4 ms
Lead Channel Setting Sensing Sensitivity: 0.3 mV

## 2020-01-10 NOTE — Progress Notes (Signed)
Patient was seen to disable alarms on ICD. Dr Rubie Maid turned of ICD features at appointment on 12/28/19 at patient request due to his health status and wishes to  have DNR status.

## 2020-01-12 ENCOUNTER — Telehealth: Payer: Self-pay | Admitting: Cardiovascular Disease

## 2020-01-12 NOTE — Telephone Encounter (Signed)
New Message  We are recommending the COVID-19 vaccine to all of our patients. Cardiac medications (including blood thinners) should not deter anyone from being vaccinated and there is no need to hold any of those medications prior to vaccine administration.     Currently, there is a hotline to call (active 12/30/19) to schedule vaccination appointments as no walk-ins will be accepted.   Number: [phone].    If an appointment is not available please go to [email or web address] to sign up for notification when additional vaccine appointments are available.   If you have further questions or concerns about the vaccine process, please visit [email or web address] or contact your primary care physician.   

## 2020-01-17 ENCOUNTER — Telehealth: Payer: Self-pay | Admitting: Cardiovascular Disease

## 2020-01-17 MED ORDER — METOLAZONE 2.5 MG PO TABS: ORAL_TABLET | ORAL | 3 refills | Status: AC

## 2020-01-17 NOTE — Telephone Encounter (Signed)
Patient's daughter called about her father taking metolazone (ZAROXOLYN) 2.5 MG tablet, once weekly. She wants to know if he can take it more often.

## 2020-01-17 NOTE — Telephone Encounter (Signed)
Returned call to patient's daughter Waynetta Sandy.She stated her father had a bad day yesterday.Stated he was sob.Stomach tight and swollen.She gave him Zaroxolyn 2.5 mg.Stated he is better today.She wanted to ask Dr.Croitoru if he can take Zaroxolyn more than once a week.Advised I will send message to Dr.Croitoru for advice.

## 2020-01-17 NOTE — Telephone Encounter (Signed)
Yes he can take up to 3 times a week, but he should double his potassium supplement on the days when he does so.

## 2020-01-17 NOTE — Telephone Encounter (Signed)
Left message to call back  

## 2020-01-17 NOTE — Telephone Encounter (Signed)
Returned call to patient's daughter Waynetta Sandy, Dr.Croitoru's advice given.

## 2020-01-27 ENCOUNTER — Other Ambulatory Visit: Payer: Self-pay | Admitting: Adult Health

## 2020-01-30 ENCOUNTER — Encounter: Payer: Medicare Other | Admitting: Cardiovascular Disease

## 2020-02-01 ENCOUNTER — Encounter: Payer: Self-pay | Admitting: Cardiovascular Disease

## 2020-02-01 ENCOUNTER — Ambulatory Visit (INDEPENDENT_AMBULATORY_CARE_PROVIDER_SITE_OTHER): Payer: Medicare Other | Admitting: Cardiovascular Disease

## 2020-02-01 ENCOUNTER — Other Ambulatory Visit: Payer: Self-pay

## 2020-02-01 VITALS — BP 106/62 | HR 66 | Ht 73.0 in | Wt 172.0 lb

## 2020-02-01 DIAGNOSIS — I4819 Other persistent atrial fibrillation: Secondary | ICD-10-CM

## 2020-02-01 DIAGNOSIS — Z95810 Presence of automatic (implantable) cardiac defibrillator: Secondary | ICD-10-CM

## 2020-02-01 DIAGNOSIS — D500 Iron deficiency anemia secondary to blood loss (chronic): Secondary | ICD-10-CM

## 2020-02-01 DIAGNOSIS — E032 Hypothyroidism due to medicaments and other exogenous substances: Secondary | ICD-10-CM

## 2020-02-01 DIAGNOSIS — E7800 Pure hypercholesterolemia, unspecified: Secondary | ICD-10-CM

## 2020-02-01 DIAGNOSIS — I10 Essential (primary) hypertension: Secondary | ICD-10-CM

## 2020-02-01 DIAGNOSIS — I25810 Atherosclerosis of coronary artery bypass graft(s) without angina pectoris: Secondary | ICD-10-CM | POA: Diagnosis not present

## 2020-02-01 DIAGNOSIS — I5022 Chronic systolic (congestive) heart failure: Secondary | ICD-10-CM | POA: Diagnosis not present

## 2020-02-01 DIAGNOSIS — N1832 Chronic kidney disease, stage 3b: Secondary | ICD-10-CM

## 2020-02-01 NOTE — Patient Instructions (Signed)
Medication Instructions:  No changes *If you need a refill on your cardiac medications before your next appointment, please call your pharmacy*  Lab Work: Your provider would like for you to have the following labs today: CBC and BMET  If you have labs (blood work) drawn today and your tests are completely normal, you will receive your results only by: [name] MyChart Message (if you have MyChart) OR . A paper copy in the mail If you have any lab test that is abnormal or we need to change your treatment, we will call you to review the results.  Follow-Up: At [facility], you and your health needs are our priority.  As part of our continuing mission to provide you with exceptional heart care, we have created designated Provider Care Teams.  These Care Teams include your primary Cardiologist (physician) and Advanced Practice Providers (APPs -  Physician Assistants and Nurse Practitioners) who all work together to provide you with the care you need, when you need it.  Your next appointment:   1 month(s)  The format for your next appointment:   In Person  Provider:   Thurmon Fair, MD  Other Instructions  You are scheduled for a  Cardioversion on 02/08/20 with Dr. Royann Shivers.  Please arrive at the [facility] (Main Entrance A) at [facility]: [address] [place], [place] [address] at 9 am. (1 hour prior to procedure.)  DIET: Nothing to eat or drink after midnight except a sip of water with medications (see medication instructions below)  Medication Instructions: Hold Furosemide the morning of the procedure  Continue your anticoagulant: Eliquis You will need to continue your anticoagulant after your procedure until you  are told by your provider that it is safe to stop   Labs:  Your provider would like for you to have the following labs today: CBC and BMET  You will need to have the coronavirus test completed prior to your procedure. An appointment has been made at 12:35 on  02/04/20. This is a Drive Up Visit at the [facility] [address] Someone will direct you to the appropriate testing line. Please tell them that you are there for procedure testing. Stay in your car and someone will be with you shortly. Please make sure to have all other labs completed before this test because you will need to stay quarantined until your procedure.  You must have a responsible person to drive you home and stay in the waiting area during your procedure. Failure to do so could result in cancellation.  Bring your insurance cards.  *Special Note: Every effort is made to have your procedure done on time. Occasionally there are emergencies that occur at the hospital that may cause delays. Please be patient if a delay does occur.

## 2020-02-01 NOTE — H&P (View-Only) (Signed)
Cardiology Office Note:    Date:  02/01/2020   ID:  [name], DOB 1941/07/22, MRN [id number]  PCP:  Patient, No Pcp Per  Cardiologist:  Thurmon Fair, MD  Electrophysiologist:  None   Referring MD: No ref. provider found   Chief Complaint  Patient presents with  . Congestive Heart Failure  . Atrial Fibrillation     History of Present Illness:    [name] is a 79 y.o. male with a hx of coronary artery disease with redo bypass surgery 2004, aortic valve replacement with biological prosthesis, severe ischemic cardiomyopathy with combined systolic and diastolic heart failure, CRT-D, bilateral carotid stenosis with bilateral endarterectomy in the remote past, recurrent persistent atrial fibrillation.  [name] remains in persistent atrial fibrillation and moderately decompensated heart failure (NYHA functional class IIIb).  Atrial fibrillation has been on multiple occasions a cause of heart failure decompensation due to loss of CRT.  He is now on amiodarone therapy and has excellent CRT (greater than 98%), but remains in heart failure, possibly due to loss of "atrial kick".  Cardioversion attempts have been delayed due to problems related to bleeding.  He has not tolerated long-term anticoagulation due to recurrent iron deficiency anemia and GI bleeding and most recently due to bleeding into his right eye anterior chamber, with associated vision loss.  The eye bleeding has resolved completely and his vision has returned to normal.  Due to [name]'s steady deterioration in functional status and quality of life he is now under home hospice care.  Sleeps better with a hospital bed and has continuous oxygen with improved symptoms at rest.  Tolerance to activity remains very poor.  He becomes short of breath with activities of daily living and personal hygiene.  He does not have lower extremity edema and denies angina pectoris.  He denies orthopnea, PND, overt bleeding, falls or injuries,  chest pain.  He did have 1 syncopal event when he jumped out of the truck too quickly.  He felt better after taking metolazone for 1 dose when he had severe abdominal bloating and shortness of breath.  Interrogation of his device today shows persistent atrial fibrillation with 98.4% biventricular pacing.  His OptiVol is actually at baseline but has been fairly volatile over the last few weeks.  He has not had any episodes of high ventricular rate.  Lead parameters remain excellent.  At [facility] request, defibrillator tachycardia detection and therapies have been turned off.  He has DNR status.  His echocardiogram performed December 5 shows EF less than 20%, signs of elevated filling pressures with a restrictive mitral inflow, severely dilated left ventricle and severely dilated left atrium, moderate mitral insufficiency, mild PAH. 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  We discussed the benefit of continued CRT-P and efforts to improve quality of life, which might even include one more attempt at DCCV, since AFib clearly is repeatedly responsible for acute exacerbations of CHF, even though he is well rate-controlled and has preserved CRT.  He has now been on increased dose of amiodarone for roughly 1 month  Past Medical History:  Diagnosis Date  . Anginal pain (HCC)   . Anxiety   . Aortic stenosis    a. s/p bioprosthetic AVR in 2010  . Arthritis    gout  . CAD (coronary artery disease)    a. s/p CABG in 1992 b. redo CABG in 2004 c. cath in 2010 showing patent LIMA-LAD, free RIMA-OM, and SVG-OM, and 80% stenosis of  small PLA branch  . Chest pain   . CHF (congestive heart failure) (HCC)   . Dysrhythmia   . GERD (gastroesophageal reflux disease)    with gas and bloating probably cause of chest pain  . H/O atrial flutter    a. s/p ablation and not on anticoagulation secondary to GIB and alcohol abuse.   . H/O: gout   . Hyperlipidemia   . Hypertension   . Hypertensive  retinopathy    OU  . ICD (implantable cardiac defibrillator) infection, s/p removal of ICD 07/12/2012   re-implanted Medtronic BiV ICD, serial number [id number]   . Ischemic cardiomyopathy    a. EF previously 20-25%, improved to 40-45% by echo in 03/2013 b. 10/2017: echo showing of 30-35% with Grade 2 DD, normal function of AVR, moderate MR.   . Myocardial infarction (HCC)   . 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 Shortness of breath     Past Surgical History:  Procedure Laterality Date  . BI-VENTRICULAR IMPLANTABLE CARDIOVERTER DEFIBRILLATOR Right 08/18/2012   Medtronic BiV ICD, serial number [id number] ; Procedure: BI-VENTRICULAR IMPLANTABLE CARDIOVERTER DEFIBRILLATOR  (CRT-D);  Surgeon: Marinus Maw, MD;  Location: [facility] CATH LAB;  Service: Cardiovascular;  Laterality: Right;  . BIV ICD GENERATOR CHANGEOUT N/A 09/08/2018   Procedure: BIV ICD GENERATOR CHANGEOUT;  Surgeon: Thurmon Fair, MD;  Location: [facility] INVASIVE CV LAB;  Service: Cardiovascular;  Laterality: N/A;  . CARDIAC CATHETERIZATION  Dec 18, 2003   with a presentation of atrial flutter with rapid ventricular response. He had a cath and two stents to his PDA after his SVG to his RCA. His other graft were patent. He had LV with EF of 30%.   . CARDIOVERSION N/A 05/30/2019   Procedure: CARDIOVERSION;  Surgeon: Thurmon Fair, MD;  Location: [facility] ENDOSCOPY;  Service: Cardiovascular;  Laterality: N/A;  . CARDIOVERSION N/A 09/30/2019   Procedure: CARDIOVERSION;  Surgeon: Thurmon Fair, MD;  Location: [facility] ENDOSCOPY;  Service: Cardiovascular;  Laterality: N/A;  . CAROTID ENDARTERECTOMY  11/2002   left in December 2003 and known renal artery stenosis of 60-70%  . CATARACT EXTRACTION Bilateral   . CORONARY ARTERY BYPASS GRAFT  1993   redo in 2000, he has had multiple MI previous to both procedures.  [name] EYE SURGERY Bilateral    Cat Sx OU  . IMPLANTABLE CARDIOVERTER DEFIBRILLATOR (ICD) GENERATOR CHANGE  N/A 06/25/2012   Procedure: ICD GENERATOR CHANGE;  Surgeon: Marinus Maw, MD;  Location[facility] CATH LAB;  Service: Cardiovascular;  Laterality: N/A;  . VALVE REPLACEMENT  2010    Current Medications: Current Meds  Medication Sig  . acetaminophen (TYLENOL) 325 MG tablet Take 325-650 mg by mouth every 6 (six) hours as needed for mild pain or headache.  [name] antiseptic oral rinse (BIOTENE) LIQD 15 mLs by Mouth Rinse route as needed for dry mouth. (Patient not taking: Reported on 02/01/2020)  . apixaban (ELIQUIS) 5 MG TABS tablet Take 1 tablet (5 mg total) by mouth 2 (two) times daily.  . carvedilol (COREG) 6.25 MG tablet Take 1 tablet (6.25 mg total) by mouth 2 (two) times daily with a meal.  . colchicine 0.6 MG tablet TAKE 1 TABLET (0.6 MG TOTAL) BY MOUTH AS NEEDED (GOUT ATTACKS).  . ferrous sulfate 325 (65 FE) MG EC tablet Take 1 tablet (325 mg total) by mouth 2 (two) times daily. (Patient not taking: Reported on 02/01/2020)  . folic acid (FOLVITE) 1 MG tablet Take 1 tablet (  1 mg total) by mouth daily.  . Menthol, Topical Analgesic, (BENGAY EX) Apply 1 application topically daily as needed (pain).  [name]. metolazone (ZAROXOLYN) 2.5 MG tablet Take 2.5 mg three times a week if needed for swelling (Patient taking differently: Take 2.5 mg by mouth See admin instructions. TAKE 1 TABLET (2.5 MG) BY MOUTH UP TO 3 TIMES A WEEK IF NEEDED FOR SWELLING)  . nitroGLYCERIN (NITROSTAT) 0.4 MG SL tablet Place 1 tablet (0.4 mg) under the tongue every 5 minutes as needed for chest pain for up to 3 doses/call 9-1-1 and MD if no relief (Patient taking differently: Place 0.4 mg under the tongue every 5 (five) minutes x 3 doses as needed for chest pain (CALL 9-1-1 AND MD, IF NO RELIEF). )  . potassium chloride SA (KLOR-CON) 20 MEQ tablet Take 30 mEq by mouth daily. On days when takes Zaroxolyn take a extra 1&1/2 tablet  . [DISCONTINUED] amiodarone (PACERONE) 400 MG tablet Take 1 tablet (400 mg total) by mouth daily.  .  [DISCONTINUED] dorzolamide-timolol (COSOPT) 22.3-6.8 MG/ML ophthalmic solution Place 1 drop into the right eye 2 (two) times daily.  . [DISCONTINUED] furosemide (LASIX) 80 MG tablet Take 1 tablet (80 mg total) by mouth daily.   Current Facility-Administered Medications for the 02/01/20 encounter (Office Visit) with Thurmon Fairroitoru, Teja Costen, MD  Medication  . sodium chloride flush (NS) 0.9 % injection 3 mL     Allergies:   Atorvastatin, Iron, and Other   Social History   Socioeconomic History  . Marital status: Married    Spouse name: Not on file  . Number of children: Not on file  . Years of education: Not on file  . Highest education level: Not on file  Occupational History  . Not on file  Tobacco Use  . Smoking status: Never Smoker  . Smokeless tobacco: Current User    Types: Chew  Substance and Sexual Activity  . Alcohol use: Yes    Alcohol/week: 14.0 standard drinks    Types: 14 Cans of beer per week  . Drug use: No  . Sexual activity: Not on file  Other Topics Concern  . Not on file  Social History Narrative  . Not on file   Social Determinants of Health   Financial Resource Strain:   . Difficulty of Paying Living Expenses: Not on file  Food Insecurity:   . Worried About Programme researcher, broadcasting/film/videounning Out of Food in the Last Year: Not on file  . Ran Out of Food in the Last Year: Not on file  Transportation Needs:   . Lack of Transportation (Medical): Not on file  . Lack of Transportation (Non-Medical): Not on file  Physical Activity:   . Days of Exercise per Week: Not on file  . Minutes of Exercise per Session: Not on file  Stress:   . Feeling of Stress : Not on file  Social Connections:   . Frequency of Communication with Friends and Family: Not on file  . Frequency of Social Gatherings with Friends and Family: Not on file  . Attends Religious Services: Not on file  . Active Member of Clubs or Organizations: Not on file  . Attends BankerClub or Organization Meetings: Not on file  . Marital  Status: Not on file     Family History: The patient's family history includes CAD in his brother.  ROS:   Please see the history of present illness.  All other systems are reviewed and are negative.   EKGs/Labs/Other Studies Reviewed:  The following studies were reviewed today: Comprehensive pacemaker download today.  EKG:  EKG is ordered today.  It shows persistent atrial fibrillation with 100% ventricular pacing at 60 bpm.  QRS is broad at 216 ms, but with positive large initial R wave in lead V1.  LV capture is confirmed today.  QTc 564 ms. Recent Labs: 09/26/2019: ALT 8 11/25/2019: B Natriuretic Peptide 1,948.2 11/28/2019: Magnesium 1.9 12/05/2019: TSH 6.990 12/27/2019: BUN 28; Creatinine, Ser 1.51; Hemoglobin 10.1; Platelets 272; Potassium 4.6; Sodium 137  Recent Lipid Panel    Component Value Date/Time   CHOL 87 (L) 05/05/2019 1402   TRIG 44 05/05/2019 1402   HDL 46 05/05/2019 1402   CHOLHDL 1.9 05/05/2019 1402   CHOLHDL 4.2 12/19/2016 0841   VLDL 15 12/19/2016 0841   LDLCALC 32 05/05/2019 1402    Physical Exam:    VS:  BP 106/62   Pulse 66   Ht 6\' 1"  (1.854 m)   Wt 172 lb (78 kg)   BMI 22.69 kg/m     Wt Readings from Last 3 Encounters:  02/01/20 172 lb (78 kg)  12/27/19 173 lb (78.5 kg)  12/21/19 174 lb (78.9 kg)     General: Alert, oriented x3, no distress, Appears elderly and frail Head: no evidence of trauma, PERRL, EOMI, no exophtalmos or lid lag, no myxedema, no xanthelasma; normal ears, nose and oropharynx Neck: 8 cm elevation in jugular venous pulsations, fairly prominent V waves and prompt hepatojugular reflux; brisk carotid pulses without delay and no carotid bruits Chest: clear to auscultation, no signs of consolidation by percussion or palpation, normal fremitus, symmetrical and full respiratory excursions Cardiovascular: Inferolateral displacement of the apical impulse, regular rhythm, normal first and paradoxically split second heart sounds, 1/6  early peaking aortic ejection murmur, no diastolic murmurs, S3 gallop is present Abdomen: no tenderness or distention, no masses by palpation, no abnormal pulsatility or arterial bruits, normal bowel sounds, no hepatosplenomegaly Extremities: no clubbing, cyanosis or edema; 2+ radial, ulnar and brachial pulses bilaterally; 2+ right femoral, posterior tibial and dorsalis pedis pulses; 2+ left femoral, posterior tibial and dorsalis pedis pulses; no subclavian or femoral bruits Neurological: grossly nonfocal Psych: Normal mood and affect   ASSESSMENT:    1. Persistent atrial fibrillation (HCC)   2. Chronic systolic heart failure (HCC)   3. Coronary artery disease involving coronary bypass graft of native heart without angina pectoris   4. 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 Pure hypercholesterolemia   6. Essential hypertension   7. Stage 3b chronic kidney disease   8. Iron deficiency anemia due to chronic blood loss   9. Hypothyroidism due to medication    PLAN:    In order of problems listed above:  1. Goals of care discussion: Sebert and his daughter Dorinda Hill (who is a Waynet[employer] Sandy) reaffirmed his decision to pursue quality of life over a length of life.  They are interested in one more attempt at improved symptoms with restoration of sinus rhythm and would like to go ahead with cardioversion.  Otherwise, the are committed to symptom relief rather than aggressive interventions. 2. CHF: NYHA functional class III, minimal signs of hypervolemia.  Weight is at his baseline of 170-174 pounds in the OptiVol suggests stable thoracic impedance.  Functional status clearly deteriorated with atrial fibrillation. LVEF now <20%. 3. AFib:  He has not been able to tolerate continuous anticoagulant therapy due to intraocular bleeding and GI bleeding leading to symptomatic anemia.  He is  tolerating anticoagulation okay for the last month.  He has been on a increased dose  of amiodarone 400 mg daily for 1 month.  We will bring him back for another attempt at cardioversion and plan to continue anticoagulation for no more than 4 weeks following that procedure, whether or not it is successful.  If cardioversion fails or is not associated with improved functional status, with plan to stop the amiodarone and focus on palliative care.  If cardioversion is successful and restoration of sinus rhythm is associated with improved symptoms, with plan to continue amiodarone 400 mg daily indefinitely. 4. CAD s/p redo CABG:  He does not have angina pectoris.[name]  He is taking statin.  Not on aspirin especially since he is on anticoagulation and has a history of bleeding complications.  [name] and his daughter do not want invasive procedures. (cath in 2010 showing patent LIMA-LAD, free RIMA-OM, and SVG-OM, and 80% stenosis of small PLA branch). 5. CRT-D:  Excellent biventricular pacing despite AFib.  ECG with +ve R in V1 consistent with good LV capture.  Turned off VT/VF detection and therapies. Continue resynchronization pacing therapy. 6. HLP:  On statin with LDL at target. 7. HTN:  He has never tolerated RAAS inhibitors due to symptomatic hypotension. 8. Carotid diseases/p bilateral carotid endarterectomies.  No focal neurological events. 9. S/P AVR: echo shows normal bioprosthetic valve function. 10. Ascending aortic aneurysm:slight increase in size at 5.3 cm by echo. 5.0 cm by CT angiogram of the chest in June 2018, 5.1 cm in March 2019. He is not a candidate for 3rd thoracotomy therefore routine monitoring does not appear to be justified. 11. CKD 3: creatinine 1.5 close to baseline.  Recheck on precardioversion labs. 12. Gout:  Had a recent gout attack when he took metolazone "booster". Now resolved. 13. Fe def anemia:Iron studies were still not fully replenished by labs from early October.  Hgb improving at 10.1. Anemia likely to worsen when we restart anticoagulation. Continue  Fe and folate supplements.  We will be rechecking his hemoglobin level prior to his cardioversion. 14. Hypothyroidism:Borderline elevated TSH level.  Might be amiodarone related.  We will recheck TSH and free T4 in a few months.   15. Amiodarone:  Dose increased to 400 mg daily. 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 Asymptomatic.  Scheduled for DCCV on WED 02/08/2020. This procedure has been fully reviewed with the patient and informed consent has been obtained.    Medication Adjustments/Labs and Tests Ordered: Current medicines are reviewed at length with the patient today.  Concerns regarding medicines are outlined above.  Orders Placed This Encounter  Procedures  . CBC  . Basic metabolic panel  . EKG 12-Lead   No orders of the defined types were placed in this encounter.   Patient Instructions  Medication Instructions:  No changes *If you need a refill on your cardiac medications before your next appointment, please call your pharmacy*  Lab Work: Your provider would like for you to have the following labs today: CBC and BMET  If you have labs (blood work) drawn today and your tests are completely normal, you will receive your results only by: [name] MyChart Message (if you have MyChart) OR . A paper copy in the mail If you have any lab test that is abnormal or we need to change your treatment, we will call you to review the results.  Follow-Up: At [facility], you and your health needs  are our priority.  As part of our continuing mission to provide you with exceptional heart care, we have created designated Provider Care Teams.  These Care Teams include your primary Cardiologist (physician) and Advanced Practice Providers (APPs -  Physician Assistants and Nurse Practitioners) who all work together to provide you with the care you need, when you need it.  Your next appointment:   1  month(s)  The format for your next appointment:   In Person  Provider:   Sanda Klein, MD  Other Instructions  You are scheduled for a  Cardioversion on 02/08/20 with Dr. Sallyanne Kuster.  Please arrive at the [facility] (Main Entrance A) at [facility]: [address] [place], [place] [address] at 9 am. (1 hour prior to procedure.)  DIET: Nothing to eat or drink after midnight except a sip of water with medications (see medication instructions below)  Medication Instructions: Hold Furosemide the morning of the procedure  Continue your anticoagulant: Eliquis You will need to continue your anticoagulant after your procedure until you  are told by your provider that it is safe to stop   Labs:  Your provider would like for you to have the following labs today: CBC and BMET  You will need to have the coronavirus test completed prior to your procedure. An appointment has been made at 12:35 on 02/04/20. This is a Drive Up Visit at the [employer] [address] Someone will direct you to the appropriate testing line. Please tell them that you are there for procedure testing. Stay in your car and someone will be with you shortly. Please make sure to have all other labs completed before this test because you will need to stay quarantined until your procedure.  You must have a responsible person to drive you home and stay in the waiting area during your procedure. Failure to do so could result in cancellation.  Bring your insurance cards.  *Special Note: Every effort is made to have your procedure done on time. Occasionally there are emergencies that occur at the hospital that may cause delays. Please be patient if a delay does occur.       Signed, Sanda Klein, MD  02/01/2020 5:51 PM    [place]

## 2020-02-01 NOTE — Progress Notes (Signed)
Cardiology Office Note:    Date:  02/01/2020   ID:  [name], DOB 05/24/1941, MRN [id number]  PCP:  Patient, No Pcp Per  Cardiologist:  Jerami Tammen, MD  Electrophysiologist:  None   Referring MD: No ref. provider found   Chief Complaint  Patient presents with  . Congestive Heart Failure  . Atrial Fibrillation     History of Present Illness:    [name] is a 79 y.o. male with a hx of coronary artery disease with redo bypass surgery 2004, aortic valve replacement with biological prosthesis, severe ischemic cardiomyopathy with combined systolic and diastolic heart failure, CRT-D, bilateral carotid stenosis with bilateral endarterectomy in the remote past, recurrent persistent atrial fibrillation.  Don remains in persistent atrial fibrillation and moderately decompensated heart failure (NYHA functional class IIIb).  Atrial fibrillation has been on multiple occasions a cause of heart failure decompensation due to loss of CRT.  He is now on amiodarone therapy and has excellent CRT (greater than 98%), but remains in heart failure, possibly due to loss of "atrial kick".  Cardioversion attempts have been delayed due to problems related to bleeding.  He has not tolerated long-term anticoagulation due to recurrent iron deficiency anemia and GI bleeding and most recently due to bleeding into his right eye anterior chamber, with associated vision loss.  The eye bleeding has resolved completely and his vision has returned to normal.  Due to [name] steady deterioration in functional status and quality of life he is now under home hospice care.  Sleeps better with a hospital bed and has continuous oxygen with improved symptoms at rest.  Tolerance to activity remains very poor.  He becomes short of breath with activities of daily living and personal hygiene.  He does not have lower extremity edema and denies angina pectoris.  He denies orthopnea, PND, overt bleeding, falls or injuries,  chest pain.  He did have 1 syncopal event when he jumped out of the truck too quickly.  He felt better after taking metolazone for 1 dose when he had severe abdominal bloating and shortness of breath.  Interrogation of his device today shows persistent atrial fibrillation with 98.4% biventricular pacing.  His OptiVol is actually at baseline but has been fairly volatile over the last few weeks.  He has not had any episodes of high ventricular rate.  Lead parameters remain excellent.  At [name] request, defibrillator tachycardia detection and therapies have been turned off.  He has DNR status.  His echocardiogram performed December 5 shows EF less than 20%, signs of elevated filling pressures with a restrictive mitral inflow, severely dilated left ventricle and severely dilated left atrium, moderate mitral insufficiency, mild PAH. 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  We discussed the benefit of continued CRT-P and efforts to improve quality of life, which might even include one more attempt at DCCV, since AFib clearly is repeatedly responsible for acute exacerbations of CHF, even though he is well rate-controlled and has preserved CRT.  He has now been on increased dose of amiodarone for roughly 1 month  Past Medical History:  Diagnosis Date  . Anginal pain (HCC)   . Anxiety   . Aortic stenosis    a. s/p bioprosthetic AVR in 2010  . Arthritis    gout  . CAD (coronary artery disease)    a. s/p CABG in 1992 b. redo CABG in 2004 c. cath in 2010 showing patent LIMA-LAD, free RIMA-OM, and SVG-OM, and 80% stenosis of   small PLA branch  . Chest pain   . CHF (congestive heart failure) (HCC)   . Dysrhythmia   . GERD (gastroesophageal reflux disease)    with gas and bloating probably cause of chest pain  . H/O atrial flutter    a. s/p ablation and not on anticoagulation secondary to GIB and alcohol abuse.   . H/O: gout   . Hyperlipidemia   . Hypertension   . Hypertensive  retinopathy    OU  . ICD (implantable cardiac defibrillator) infection, s/p removal of ICD 07/12/2012   re-implanted Medtronic BiV ICD, serial number [id number]   . Ischemic cardiomyopathy    a. EF previously 20-25%, improved to 40-45% by echo in 03/2013 b. 10/2017: echo showing of 30-35% with Grade 2 DD, normal function of AVR, moderate MR.   . Myocardial infarction (HCC)   . 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 Shortness of breath     Past Surgical History:  Procedure Laterality Date  . BI-VENTRICULAR IMPLANTABLE CARDIOVERTER DEFIBRILLATOR Right 08/18/2012   Medtronic BiV ICD, serial number [id number] ; Procedure: BI-VENTRICULAR IMPLANTABLE CARDIOVERTER DEFIBRILLATOR  (CRT-D);  Surgeon: Marinus Maw, MD;  Location: [facility] CATH LAB;  Service: Cardiovascular;  Laterality: Right;  . BIV ICD GENERATOR CHANGEOUT N/A 09/08/2018   Procedure: BIV ICD GENERATOR CHANGEOUT;  Surgeon: Thurmon Fair, MD;  Location: [facility] INVASIVE CV LAB;  Service: Cardiovascular;  Laterality: N/A;  . CARDIAC CATHETERIZATION  Dec 18, 2003   with a presentation of atrial flutter with rapid ventricular response. He had a cath and two stents to his PDA after his SVG to his RCA. His other graft were patent. He had LV with EF of 30%.   . CARDIOVERSION N/A 05/30/2019   Procedure: CARDIOVERSION;  Surgeon: Thurmon Fair, MD;  Location: [facility] ENDOSCOPY;  Service: Cardiovascular;  Laterality: N/A;  . CARDIOVERSION N/A 09/30/2019   Procedure: CARDIOVERSION;  Surgeon: Thurmon Fair, MD;  Location: [facility] ENDOSCOPY;  Service: Cardiovascular;  Laterality: N/A;  . CAROTID ENDARTERECTOMY  11/2002   left in December 2003 and known renal artery stenosis of 60-70%  . CATARACT EXTRACTION Bilateral   . CORONARY ARTERY BYPASS GRAFT  1993   redo in 2000, he has had multiple MI previous to both procedures.  [name] EYE SURGERY Bilateral    Cat Sx OU  . IMPLANTABLE CARDIOVERTER DEFIBRILLATOR (ICD) GENERATOR CHANGE  N/A 06/25/2012   Procedure: ICD GENERATOR CHANGE;  Surgeon: Marinus Maw, MD;  Location[facility] C[facility] LAB;  Service: Cardiovascular;  Laterality: N/A;  . VALVE REPLACEMENT  2010    Current Medications: Current Meds  Medication Sig  . acetaminophen (TYLENOL) 325 MG tablet Take 325-650 mg by mouth every 6 (six) hours as needed for mild pain or headache.  [name] antiseptic oral rinse (BIOTENE) LIQD 15 mLs by Mouth Rinse route as needed for dry mouth. (Patient not taking: Reported on 02/01/2020)  . apixaban (ELIQUIS) 5 MG TABS tablet Take 1 tablet (5 mg total) by mouth 2 (two) times daily.  . carvedilol (COREG) 6.25 MG tablet Take 1 tablet (6.25 mg total) by mouth 2 (two) times daily with a meal.  . colchicine 0.6 MG tablet TAKE 1 TABLET (0.6 MG TOTAL) BY MOUTH AS NEEDED (GOUT ATTACKS).  . ferrous sulfate 325 (65 FE) MG EC tablet Take 1 tablet (325 mg total) by mouth 2 (two) times daily. (Patient not taking: Reported on 02/01/2020)  . folic acid (FOLVITE) 1 MG tablet Take 1 tablet (  1 mg total) by mouth daily.  . Menthol, Topical Analgesic, (BENGAY EX) Apply 1 application topically daily as needed (pain).  . metolazone (ZAROXOLYN) 2.5 MG tablet Take 2.5 mg three times a week if needed for swelling (Patient taking differently: Take 2.5 mg by mouth See admin instructions. TAKE 1 TABLET (2.5 MG) BY MOUTH UP TO 3 TIMES A WEEK IF NEEDED FOR SWELLING)  . nitroGLYCERIN (NITROSTAT) 0.4 MG SL tablet Place 1 tablet (0.4 mg) under the tongue every 5 minutes as needed for chest pain for up to 3 doses/call 9-1-1 and MD if no relief (Patient taking differently: Place 0.4 mg under the tongue every 5 (five) minutes x 3 doses as needed for chest pain (CALL 9-1-1 AND MD, IF NO RELIEF). )  . potassium chloride SA (KLOR-CON) 20 MEQ tablet Take 30 mEq by mouth daily. On days when takes Zaroxolyn take a extra 1&1/2 tablet  . [DISCONTINUED] amiodarone (PACERONE) 400 MG tablet Take 1 tablet (400 mg total) by mouth daily.  .  [DISCONTINUED] dorzolamide-timolol (COSOPT) 22.3-6.8 MG/ML ophthalmic solution Place 1 drop into the right eye 2 (two) times daily.  . [DISCONTINUED] furosemide (LASIX) 80 MG tablet Take 1 tablet (80 mg total) by mouth daily.   Current Facility-Administered Medications for the 02/01/20 encounter (Office Visit) with Azrael Huss, MD  Medication  . sodium chloride flush (NS) 0.9 % injection 3 mL     Allergies:   Atorvastatin, Iron, and Other   Social History   Socioeconomic History  . Marital status: Married    Spouse name: Not on file  . Number of children: Not on file  . Years of education: Not on file  . Highest education level: Not on file  Occupational History  . Not on file  Tobacco Use  . Smoking status: Never Smoker  . Smokeless tobacco: Current User    Types: Chew  Substance and Sexual Activity  . Alcohol use: Yes    Alcohol/week: 14.0 standard drinks    Types: 14 Cans of beer per week  . Drug use: No  . Sexual activity: Not on file  Other Topics Concern  . Not on file  Social History Narrative  . Not on file   Social Determinants of Health   Financial Resource Strain:   . Difficulty of Paying Living Expenses: Not on file  Food Insecurity:   . Worried About Running Out of Food in the Last Year: Not on file  . Ran Out of Food in the Last Year: Not on file  Transportation Needs:   . Lack of Transportation (Medical): Not on file  . Lack of Transportation (Non-Medical): Not on file  Physical Activity:   . Days of Exercise per Week: Not on file  . Minutes of Exercise per Session: Not on file  Stress:   . Feeling of Stress : Not on file  Social Connections:   . Frequency of Communication with Friends and Family: Not on file  . Frequency of Social Gatherings with Friends and Family: Not on file  . Attends Religious Services: Not on file  . Active Member of Clubs or Organizations: Not on file  . Attends Club or Organization Meetings: Not on file  . Marital  Status: Not on file     Family History: The patient's family history includes CAD in his brother.  ROS:   Please see the history of present illness.  All other systems are reviewed and are negative.   EKGs/Labs/Other Studies Reviewed:      The following studies were reviewed today: Comprehensive pacemaker download today.  EKG:  EKG is ordered today.  It shows persistent atrial fibrillation with 100% ventricular pacing at 60 bpm.  QRS is broad at 216 ms, but with positive large initial R wave in lead V1.  LV capture is confirmed today.  QTc 564 ms. Recent Labs: 09/26/2019: ALT 8 11/25/2019: B Natriuretic Peptide 1,948.2 11/28/2019: Magnesium 1.9 12/05/2019: TSH 6.990 12/27/2019: BUN 28; Creatinine, Ser 1.51; Hemoglobin 10.1; Platelets 272; Potassium 4.6; Sodium 137  Recent Lipid Panel    Component Value Date/Time   CHOL 87 (L) 05/05/2019 1402   TRIG 44 05/05/2019 1402   HDL 46 05/05/2019 1402   CHOLHDL 1.9 05/05/2019 1402   CHOLHDL 4.2 12/19/2016 0841   VLDL 15 12/19/2016 0841   LDLCALC 32 05/05/2019 1402    Physical Exam:    VS:  BP 106/62   Pulse 66   Ht 6\' 1"  (1.854 m)   Wt 172 lb (78 kg)   BMI 22.69 kg/m     Wt Readings from Last 3 Encounters:  02/01/20 172 lb (78 kg)  12/27/19 173 lb (78.5 kg)  12/21/19 174 lb (78.9 kg)     General: Alert, oriented x3, no distress, Appears elderly and frail Head: no evidence of trauma, PERRL, EOMI, no exophtalmos or lid lag, no myxedema, no xanthelasma; normal ears, nose and oropharynx Neck: 8 cm elevation in jugular venous pulsations, fairly prominent V waves and prompt hepatojugular reflux; brisk carotid pulses without delay and no carotid bruits Chest: clear to auscultation, no signs of consolidation by percussion or palpation, normal fremitus, symmetrical and full respiratory excursions Cardiovascular: Inferolateral displacement of the apical impulse, regular rhythm, normal first and paradoxically split second heart sounds, 1/6  early peaking aortic ejection murmur, no diastolic murmurs, S3 gallop is present Abdomen: no tenderness or distention, no masses by palpation, no abnormal pulsatility or arterial bruits, normal bowel sounds, no hepatosplenomegaly Extremities: no clubbing, cyanosis or edema; 2+ radial, ulnar and brachial pulses bilaterally; 2+ right femoral, posterior tibial and dorsalis pedis pulses; 2+ left femoral, posterior tibial and dorsalis pedis pulses; no subclavian or femoral bruits Neurological: grossly nonfocal Psych: Normal mood and affect   ASSESSMENT:    1. Persistent atrial fibrillation (HCC)   2. Chronic systolic heart failure (HCC)   3. Coronary artery disease involving coronary bypass graft of native heart without angina pectoris   4. 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 Pure hypercholesterolemia   6. Essential hypertension   7. Stage 3b chronic kidney disease   8. Iron deficiency anemia due to chronic blood loss   9. Hypothyroidism due to medication    PLAN:    In order of problems listed above:  1. Goals of care discussion: Sebert and his daughter Dorinda Hill (who is a Waynetta Sandy) reaffirmed his decision to pursue quality of life over a length of life.  They are interested in one more attempt at improved symptoms with restoration of sinus rhythm and would like to go ahead with cardioversion.  Otherwise, the are committed to symptom relief rather than aggressive interventions. 2. CHF: NYHA functional class III, minimal signs of hypervolemia.  Weight is at his baseline of 170-174 pounds in the OptiVol suggests stable thoracic impedance.  Functional status clearly deteriorated with atrial fibrillation. LVEF now <20%. 3. AFib:  He has not been able to tolerate continuous anticoagulant therapy due to intraocular bleeding and GI bleeding leading to symptomatic anemia.  He is  tolerating anticoagulation okay for the last month.  He has been on a increased dose  of amiodarone 400 mg daily for 1 month.  We will bring him back for another attempt at cardioversion and plan to continue anticoagulation for no more than 4 weeks following that procedure, whether or not it is successful.  If cardioversion fails or is not associated with improved functional status, with plan to stop the amiodarone and focus on palliative care.  If cardioversion is successful and restoration of sinus rhythm is associated with improved symptoms, with plan to continue amiodarone 400 mg daily indefinitely. 4. CAD s/p redo CABG:  He does not have angina pectoris.[name]  He is taking statin.  Not on aspirin especially since he is on anticoagulation and has a history of bleeding complications.  Roe Coombs and his daughter do not want invasive procedures. (cath in 2010 showing patent LIMA-LAD, free RIMA-OM, and SVG-OM, and 80% stenosis of small PLA branch). 5. CRT-D:  Excellent biventricular pacing despite AFib.  ECG with +ve R in V1 consistent with good LV capture.  Turned off VT/VF detection and therapies. Continue resynchronization pacing therapy. 6. HLP:  On statin with LDL at target. 7. HTN:  He has never tolerated RAAS inhibitors due to symptomatic hypotension. 8. Carotid diseases/p bilateral carotid endarterectomies.  No focal neurological events. 9. S/P AVR: echo shows normal bioprosthetic valve function. 10. Ascending aortic aneurysm:slight increase in size at 5.3 cm by echo. 5.0 cm by CT angiogram of the chest in June 2018, 5.1 cm in March 2019. He is not a candidate for 3rd thoracotomy therefore routine monitoring does not appear to be justified. 11. CKD 3: creatinine 1.5 close to baseline.  Recheck on precardioversion labs. 12. Gout:  Had a recent gout attack when he took metolazone "booster". Now resolved. 13. Fe def anemia:Iron studies were still not fully replenished by labs from early October.  Hgb improving at 10.1. Anemia likely to worsen when we restart anticoagulation. Continue  Fe and folate supplements.  We will be rechecking his hemoglobin level prior to his cardioversion. 14. Hypothyroidism:Borderline elevated TSH level.  Might be amiodarone related.  We will recheck TSH and free T4 in a few months.   15. Amiodarone:  Dose increased to 400 mg daily. 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 Asymptomatic.  Scheduled for DCCV on WED 02/08/2020. This procedure has been fully reviewed with the patient and informed consent has been obtained.    Medication Adjustments/Labs and Tests Ordered: Current medicines are reviewed at length with the patient today.  Concerns regarding medicines are outlined above.  Orders Placed This Encounter  Procedures  . CBC  . Basic metabolic panel  . EKG 12-Lead   No orders of the defined types were placed in this encounter.   Patient Instructions  Medication Instructions:  No changes *If you need a refill on your cardiac medications before your next appointment, please call your pharmacy*  Lab Work: Your provider would like for you to have the following labs today: CBC and BMET  If you have labs (blood work) drawn today and your tests are completely normal, you will receive your results only by: [name] MyChart Message (if you have MyChart) OR . A paper copy in the mail If you have any lab test that is abnormal or we need to change your treatment, we will call you to review the results.  Follow-Up: At [facility], you and your health needs  are our priority.  As part of our continuing mission to provide you with exceptional heart care, we have created designated Provider Care Teams.  These Care Teams include your primary Cardiologist (physician) and Advanced Practice Providers (APPs -  Physician Assistants and Nurse Practitioners) who all work together to provide you with the care you need, when you need it.  Your next appointment:   1  month(s)  The format for your next appointment:   In Person  Provider:   Sanda Klein, MD  Other Instructions  You are scheduled for a  Cardioversion on 02/08/20 with Dr. Sallyanne Kuster.  Please arrive at the [facility] (Main Entrance A) at [facility]: [address] [place], [place] [address] at 9 am. (1 hour prior to procedure.)  DIET: Nothing to eat or drink after midnight except a sip of water with medications (see medication instructions below)  Medication Instructions: Hold Furosemide the morning of the procedure  Continue your anticoagulant: Eliquis You will need to continue your anticoagulant after your procedure until you  are told by your provider that it is safe to stop   Labs:  Your provider would like for you to have the following labs today: CBC and BMET  You will need to have the coronavirus test completed prior to your procedure. An appointment has been made at 12:35 on 02/04/20. This is a Drive Up Visit at the [employer] [address] Someone will direct you to the appropriate testing line. Please tell them that you are there for procedure testing. Stay in your car and someone will be with you shortly. Please make sure to have all other labs completed before this test because you will need to stay quarantined until your procedure.  You must have a responsible person to drive you home and stay in the waiting area during your procedure. Failure to do so could result in cancellation.  Bring your insurance cards.  *Special Note: Every effort is made to have your procedure done on time. Occasionally there are emergencies that occur at the hospital that may cause delays. Please be patient if a delay does occur.       Signed, Sanda Klein, MD  02/01/2020 5:51 PM    [place]

## 2020-02-02 LAB — BASIC METABOLIC PANEL
BUN/Creatinine Ratio: 18 (ref 10–24)
BUN: 27 mg/dL (ref 8–27)
CO2: 22 mmol/L (ref 20–29)
Calcium: 9.2 mg/dL (ref 8.6–10.2)
Chloride: 99 mmol/L (ref 96–106)
Creatinine, Ser: 1.5 mg/dL — ABNORMAL HIGH (ref 0.76–1.27)
GFR calc Af Amer: 51 mL/min/{1.73_m2} — ABNORMAL LOW (ref >59)
GFR calc non Af Amer: 44 mL/min/{1.73_m2} — ABNORMAL LOW (ref >59)
Glucose: 82 mg/dL (ref 65–99)
Potassium: 4.3 mmol/L (ref 3.5–5.2)
Sodium: 138 mmol/L (ref 134–144)

## 2020-02-02 LAB — CBC
Hematocrit: 33.5 % — ABNORMAL LOW (ref 37.5–51.0)
Hemoglobin: 10.1 g/dL — ABNORMAL LOW (ref 13.0–17.7)
MCH: 25.2 pg — ABNORMAL LOW (ref 26.6–33.0)
MCHC: 30.1 g/dL — ABNORMAL LOW (ref 31.5–35.7)
MCV: 84 fL (ref 79–97)
Platelets: 238 10*3/uL (ref 150–450)
RBC: 4.01 x10E6/uL — ABNORMAL LOW (ref 4.14–5.80)
RDW: 16.3 % — ABNORMAL HIGH (ref 11.6–15.4)
WBC: 5.5 10*3/uL (ref 3.4–10.8)

## 2020-02-04 ENCOUNTER — Other Ambulatory Visit (HOSPITAL_COMMUNITY)
Admission: RE | Admit: 2020-02-04 | Discharge: 2020-02-04 | Disposition: A | Discharge status: Home / Self Care | Payer: Medicare Other | Source: Ambulatory Visit | Attending: Cardiovascular Disease | Admitting: Cardiovascular Disease

## 2020-02-04 DIAGNOSIS — Z01812 Encounter for preprocedural laboratory examination: Secondary | ICD-10-CM | POA: Insufficient documentation

## 2020-02-04 DIAGNOSIS — Z20822 Contact with and (suspected) exposure to covid-19: Secondary | ICD-10-CM | POA: Diagnosis not present

## 2020-02-04 LAB — SARS CORONAVIRUS 2 (TAT 6-24 HRS): SARS Coronavirus 2: NEGATIVE

## 2020-02-05 ENCOUNTER — Other Ambulatory Visit: Payer: Self-pay | Admitting: *Deleted

## 2020-02-07 ENCOUNTER — Other Ambulatory Visit: Payer: Self-pay | Admitting: Cardiovascular Disease

## 2020-02-08 ENCOUNTER — Ambulatory Visit (HOSPITAL_COMMUNITY): Payer: Medicare Other | Admitting: Certified Registered"

## 2020-02-08 ENCOUNTER — Ambulatory Visit (HOSPITAL_COMMUNITY)
Admission: RE | Admit: 2020-02-08 | Discharge: 2020-02-08 | Disposition: A | Discharge status: Home / Self Care | Payer: Medicare Other | Attending: Cardiovascular Disease | Admitting: Cardiovascular Disease

## 2020-02-08 ENCOUNTER — Other Ambulatory Visit: Payer: Self-pay

## 2020-02-08 ENCOUNTER — Encounter (HOSPITAL_COMMUNITY): Payer: Self-pay | Admitting: Cardiovascular Disease

## 2020-02-08 ENCOUNTER — Encounter (HOSPITAL_COMMUNITY)
Admission: RE | Disposition: A | Discharge status: Home / Self Care | Payer: Self-pay | Source: Home / Self Care | Attending: Cardiovascular Disease

## 2020-02-08 DIAGNOSIS — I739 Peripheral vascular disease, unspecified: Secondary | ICD-10-CM | POA: Insufficient documentation

## 2020-02-08 DIAGNOSIS — I712 Thoracic aortic aneurysm, without rupture: Secondary | ICD-10-CM | POA: Insufficient documentation

## 2020-02-08 DIAGNOSIS — I130 Hypertensive heart and chronic kidney disease with heart failure and stage 1 through stage 4 chronic kidney disease, or unspecified chronic kidney disease: Secondary | ICD-10-CM | POA: Diagnosis not present

## 2020-02-08 DIAGNOSIS — E7800 Pure hypercholesterolemia, unspecified: Secondary | ICD-10-CM | POA: Insufficient documentation

## 2020-02-08 DIAGNOSIS — Z95810 Presence of automatic (implantable) cardiac defibrillator: Secondary | ICD-10-CM

## 2020-02-08 DIAGNOSIS — Z951 Presence of aortocoronary bypass graft: Secondary | ICD-10-CM | POA: Diagnosis not present

## 2020-02-08 DIAGNOSIS — I5022 Chronic systolic (congestive) heart failure: Secondary | ICD-10-CM | POA: Diagnosis not present

## 2020-02-08 DIAGNOSIS — N1832 Chronic kidney disease, stage 3b: Secondary | ICD-10-CM | POA: Diagnosis not present

## 2020-02-08 DIAGNOSIS — D500 Iron deficiency anemia secondary to blood loss (chronic): Secondary | ICD-10-CM | POA: Insufficient documentation

## 2020-02-08 DIAGNOSIS — K219 Gastro-esophageal reflux disease without esophagitis: Secondary | ICD-10-CM | POA: Diagnosis not present

## 2020-02-08 DIAGNOSIS — I4819 Other persistent atrial fibrillation: Secondary | ICD-10-CM

## 2020-02-08 DIAGNOSIS — Z79899 Other long term (current) drug therapy: Secondary | ICD-10-CM | POA: Insufficient documentation

## 2020-02-08 DIAGNOSIS — Z7901 Long term (current) use of anticoagulants: Secondary | ICD-10-CM | POA: Insufficient documentation

## 2020-02-08 DIAGNOSIS — I25810 Atherosclerosis of coronary artery bypass graft(s) without angina pectoris: Secondary | ICD-10-CM | POA: Diagnosis not present

## 2020-02-08 DIAGNOSIS — Z953 Presence of xenogenic heart valve: Secondary | ICD-10-CM | POA: Diagnosis not present

## 2020-02-08 DIAGNOSIS — I350 Nonrheumatic aortic (valve) stenosis: Secondary | ICD-10-CM | POA: Insufficient documentation

## 2020-02-08 DIAGNOSIS — I255 Ischemic cardiomyopathy: Secondary | ICD-10-CM | POA: Diagnosis not present

## 2020-02-08 DIAGNOSIS — E785 Hyperlipidemia, unspecified: Secondary | ICD-10-CM | POA: Insufficient documentation

## 2020-02-08 DIAGNOSIS — M109 Gout, unspecified: Secondary | ICD-10-CM | POA: Insufficient documentation

## 2020-02-08 DIAGNOSIS — I252 Old myocardial infarction: Secondary | ICD-10-CM | POA: Diagnosis not present

## 2020-02-08 DIAGNOSIS — I5043 Acute on chronic combined systolic (congestive) and diastolic (congestive) heart failure: Secondary | ICD-10-CM | POA: Diagnosis not present

## 2020-02-08 HISTORY — PX: CARDIOVERSION: SHX1299

## 2020-02-08 SURGERY — CARDIOVERSION
Anesthesia: General

## 2020-02-08 MED ORDER — SODIUM CHLORIDE 0.9 % IV SOLN: INTRAVENOUS | Status: DC

## 2020-02-08 MED ORDER — LIDOCAINE 2% (20 MG/ML) 5 ML SYRINGE
INTRAMUSCULAR | Status: DC | PRN
  Administered 2020-02-08: 60 mg via INTRAVENOUS

## 2020-02-08 MED ORDER — PROPOFOL 10 MG/ML IV BOLUS
INTRAVENOUS | Status: DC | PRN
  Administered 2020-02-08: 40 mg via INTRAVENOUS

## 2020-02-08 NOTE — Transfer of Care (Signed)
Immediate Anesthesia Transfer of Care Note  Patient: [name]  Procedure(s) Performed: CARDIOVERSION (N/A )  Patient Location: Endoscopy Unit  Anesthesia Type:General  Level of Consciousness: drowsy and patient cooperative  Airway & Oxygen Therapy: Patient Spontanous Breathing  Post-op Assessment: Report given to RN  Post vital signs: Reviewed and stable  Last Vitals:  Vitals Value Taken Time  BP    Temp    Pulse    Resp    SpO2      Last Pain:  Vitals:   02/08/20 0918  TempSrc: Axillary  PainSc: 0-No pain         Complications: No apparent anesthesia complications

## 2020-02-08 NOTE — Anesthesia Postprocedure Evaluation (Signed)
Anesthesia Post Note  Patient: [name]  Procedure(s) Performed: CARDIOVERSION (N/A )     Patient location during evaluation: Endoscopy Anesthesia Type: General Level of consciousness: awake and alert Pain management: pain level controlled Vital Signs Assessment: post-procedure vital signs reviewed and stable Respiratory status: spontaneous breathing, nonlabored ventilation and respiratory function stable Cardiovascular status: blood pressure returned to baseline and stable Postop Assessment: no apparent nausea or vomiting Anesthetic complications: no    Last Vitals:  Vitals:   02/08/20 0947 02/08/20 0958  BP: 121/61 (!) 122/57  Pulse: 61 60  Resp: (!) 23 16  Temp: (!) 36.1 C   SpO2: 96% 97%    Last Pain:  Vitals:   02/08/20 0958  TempSrc:   PainSc: 0-No pain                 Ceniyah Thorp,W. EDMOND

## 2020-02-08 NOTE — Interval H&P Note (Signed)
History and Physical Interval Note:  02/08/2020 9:20 AM  [name]  has presented today for surgery, with the diagnosis of AFIB.  The various methods of treatment have been discussed with the patient and family. After consideration of risks, benefits and other options for treatment, the patient has consented to  Procedure(s): CARDIOVERSION (N/A) as a surgical intervention.  The patient's history has been reviewed, patient examined, no change in status, stable for surgery.  I have reviewed the patient's chart and labs.  Questions were answered to the patient's satisfaction.     Shaheim Mahar

## 2020-02-08 NOTE — Anesthesia Preprocedure Evaluation (Addendum)
Anesthesia Evaluation  Patient identified by MRN, date of birth, ID band Patient awake    Reviewed: Allergy & Precautions, H&P , NPO status , Patient's Chart, lab work & pertinent test results, reviewed documented beta blocker date and time   Airway Mallampati: II  TM Distance: >3 FB Neck ROM: Full    Dental no notable dental hx. (+) Edentulous Upper, Edentulous Lower, Dental Advisory Given   Pulmonary neg pulmonary ROS,    Pulmonary exam normal breath sounds clear to auscultation       Cardiovascular hypertension, Pt. on medications and Pt. on home beta blockers + CAD, + Past MI, + CABG, + Peripheral Vascular Disease and +CHF  + dysrhythmias Atrial Fibrillation  Rhythm:Regular Rate:Normal     Neuro/Psych Anxiety negative neurological ROS     GI/Hepatic Neg liver ROS, GERD  ,  Endo/Other  Hypothyroidism   Renal/GU negative Renal ROS  negative genitourinary   Musculoskeletal  (+) Arthritis , Osteoarthritis,    Abdominal   Peds  Hematology  (+) Blood dyscrasia, anemia ,   Anesthesia Other Findings   Reproductive/Obstetrics negative OB ROS                            Anesthesia Physical Anesthesia Plan  ASA: IV  Anesthesia Plan: General   Post-op Pain Management:    Induction: Intravenous  PONV Risk Score and Plan: 2 and Propofol infusion and Treatment may vary due to age or medical condition  Airway Management Planned: Mask  Additional Equipment:   Intra-op Plan:   Post-operative Plan:   Informed Consent: I have reviewed the patients History and Physical, chart, labs and discussed the procedure including the risks, benefits and alternatives for the proposed anesthesia with the patient or authorized representative who has indicated his/her understanding and acceptance.     Dental advisory given  Plan Discussed with: CRNA  Anesthesia Plan Comments:         Anesthesia  Quick Evaluation

## 2020-02-24 ENCOUNTER — Ambulatory Visit: Payer: Medicare Other | Admitting: Cardiovascular Disease

## 2020-03-01 ENCOUNTER — Telehealth: Payer: Self-pay | Admitting: Cardiovascular Disease

## 2020-03-01 NOTE — Telephone Encounter (Signed)
Robin from [facility] will be faxing a form of  Physicians order of terminal illness and routine standing orders tomorrow morning. She will need Dr. Royann Shivers to sign the order and fax it back to her.  The number provided is the main office number for the [facility].

## 2020-03-02 NOTE — Telephone Encounter (Signed)
Forms will be faxed when the provider is back in the office.

## 2020-03-14 ENCOUNTER — Telehealth: Payer: Self-pay | Admitting: Cardiovascular Disease

## 2020-03-14 ENCOUNTER — Telehealth: Payer: Self-pay

## 2020-03-14 NOTE — Telephone Encounter (Signed)
New Message °

## 2020-03-14 NOTE — Telephone Encounter (Signed)
Marchelle Folks from [facility] calling stating that the pt told her that Dr. Royann Shivers wanted pt to hold Eliquis for now because of the cardioversion that pt had and Marchelle Folks wanting Dr. Erin Hearing nurse to call back to confirm this at [phone]. Please address

## 2020-03-14 NOTE — Telephone Encounter (Signed)
[name] has been made aware. Eliquis has been taken of the patient's medication list.

## 2020-03-14 NOTE — Telephone Encounter (Signed)
Yes, please stop Eliquis

## 2020-03-14 NOTE — Telephone Encounter (Signed)
Returned the call to [place] the hospice nurse. She was calling to confirm that the patient should go ahead and stop the Eliquis.   Per the last office note:  1.  He has not been able to tolerate continuous anticoagulant therapy due to intraocular bleeding and GI bleeding leading to symptomatic anemia.  He is tolerating anticoagulation okay for the last month.  He has been on a increased dose of amiodarone 400 mg daily for 1 month.  We will bring him back for another attempt at cardioversion and plan to continue anticoagulation for no more than 4 weeks following that procedure, whether or not it is successful.  If cardioversion fails or is not associated with improved functional status, with plan to stop the amiodarone and focus on palliative care.  If cardioversion is successful and restoration of sinus rhythm is associated with improved symptoms, with plan to continue amiodarone 400 mg daily indefinitely.  Message sent to the provider for verification. Marchelle Folks has been made aware that it may be Monday before I call her back.

## 2020-03-14 NOTE — Telephone Encounter (Signed)
duplicate

## 2020-03-21 ENCOUNTER — Telehealth (INDEPENDENT_AMBULATORY_CARE_PROVIDER_SITE_OTHER): Payer: Medicare Other | Admitting: Cardiovascular Disease

## 2020-03-21 ENCOUNTER — Encounter: Payer: Self-pay | Admitting: Cardiovascular Disease

## 2020-03-21 ENCOUNTER — Telehealth: Payer: Self-pay | Admitting: *Deleted

## 2020-03-21 VITALS — BP 110/60 | HR 60 | Ht 73.0 in | Wt 170.0 lb

## 2020-03-21 DIAGNOSIS — F17220 Nicotine dependence, chewing tobacco, uncomplicated: Secondary | ICD-10-CM

## 2020-03-21 DIAGNOSIS — N183 Chronic kidney disease, stage 3 unspecified: Secondary | ICD-10-CM

## 2020-03-21 DIAGNOSIS — Z66 Do not resuscitate: Secondary | ICD-10-CM

## 2020-03-21 DIAGNOSIS — I25810 Atherosclerosis of coronary artery bypass graft(s) without angina pectoris: Secondary | ICD-10-CM

## 2020-03-21 DIAGNOSIS — I5040 Unspecified combined systolic (congestive) and diastolic (congestive) heart failure: Secondary | ICD-10-CM

## 2020-03-21 DIAGNOSIS — I5022 Chronic systolic (congestive) heart failure: Secondary | ICD-10-CM

## 2020-03-21 DIAGNOSIS — N1832 Chronic kidney disease, stage 3b: Secondary | ICD-10-CM

## 2020-03-21 DIAGNOSIS — I7121 Aneurysm of the ascending aorta, without rupture: Secondary | ICD-10-CM

## 2020-03-21 DIAGNOSIS — I4819 Other persistent atrial fibrillation: Secondary | ICD-10-CM

## 2020-03-21 DIAGNOSIS — I712 Thoracic aortic aneurysm, without rupture: Secondary | ICD-10-CM

## 2020-03-21 DIAGNOSIS — E039 Hypothyroidism, unspecified: Secondary | ICD-10-CM

## 2020-03-21 DIAGNOSIS — I130 Hypertensive heart and chronic kidney disease with heart failure and stage 1 through stage 4 chronic kidney disease, or unspecified chronic kidney disease: Secondary | ICD-10-CM

## 2020-03-21 DIAGNOSIS — D500 Iron deficiency anemia secondary to blood loss (chronic): Secondary | ICD-10-CM

## 2020-03-21 DIAGNOSIS — Z95810 Presence of automatic (implantable) cardiac defibrillator: Secondary | ICD-10-CM

## 2020-03-21 DIAGNOSIS — I2510 Atherosclerotic heart disease of native coronary artery without angina pectoris: Secondary | ICD-10-CM

## 2020-03-21 DIAGNOSIS — Z953 Presence of xenogenic heart valve: Secondary | ICD-10-CM

## 2020-03-21 DIAGNOSIS — Z951 Presence of aortocoronary bypass graft: Secondary | ICD-10-CM

## 2020-03-21 DIAGNOSIS — D6489 Other specified anemias: Secondary | ICD-10-CM

## 2020-03-21 DIAGNOSIS — Z952 Presence of prosthetic heart valve: Secondary | ICD-10-CM

## 2020-03-21 DIAGNOSIS — Z515 Encounter for palliative care: Secondary | ICD-10-CM

## 2020-03-21 DIAGNOSIS — M109 Gout, unspecified: Secondary | ICD-10-CM

## 2020-03-21 DIAGNOSIS — E785 Hyperlipidemia, unspecified: Secondary | ICD-10-CM

## 2020-03-21 DIAGNOSIS — M1A09X0 Idiopathic chronic gout, multiple sites, without tophus (tophi): Secondary | ICD-10-CM

## 2020-03-21 NOTE — Patient Instructions (Signed)
Medication Instructions:  No changes *If you need a refill on your cardiac medications before your next appointment, please call your pharmacy*   Lab Work: None ordered If you have labs (blood work) drawn today and your tests are completely normal, you will receive your results only by: [name] MyChart Message (if you have MyChart) OR . A paper copy in the mail If you have any lab test that is abnormal or we need to change your treatment, we will call you to review the results.   Testing/Procedures: None ordered   Follow-Up: At [facility], you and your health needs are our priority.  As part of our continuing mission to provide you with exceptional heart care, we have created designated Provider Care Teams.  These Care Teams include your primary Cardiologist (physician) and Advanced Practice Providers (APPs -  Physician Assistants and Nurse Practitioners) who all work together to provide you with the care you need, when you need it.  We recommend signing up for the patient portal called "MyChart".  Sign up information is provided on this After Visit Summary.  MyChart is used to connect with patients for Virtual Visits (Telemedicine).  Patients are able to view lab/test results, encounter notes, upcoming appointments, etc.  Non-urgent messages can be sent to your provider as well.   To learn more about what you can do with MyChart, go to [email or web address].    Your next appointment:   1 month(s)  The format for your next appointment:   Virtual Visit   Provider:   Thurmon Fair, MD

## 2020-03-21 NOTE — Telephone Encounter (Signed)
  Patient Consent for Virtual Visit         [name] has provided verbal consent on 03/21/2020 for a virtual visit (video or telephone).   CONSENT FOR VIRTUAL VISIT FOR:  [name]  By participating in this virtual visit I agree to the following:  I hereby voluntarily request, consent and authorize [facility] and its employed or contracted physicians, physician assistants, nurse practitioners or other licensed health care professionals (the Practitioner), to provide me with telemedicine health care services (the "Services") as deemed necessary by the treating Practitioner. I acknowledge and consent to receive the Services by the Practitioner via telemedicine. I understand that the telemedicine visit will involve communicating with the Practitioner through live audiovisual communication technology and the disclosure of certain medical information by electronic transmission. I acknowledge that I have been given the opportunity to request an in-person assessment or other available alternative prior to the telemedicine visit and am voluntarily participating in the telemedicine visit.  I understand that I have the right to withhold or withdraw my consent to the use of telemedicine in the course of my care at any time, without affecting my right to future care or treatment, and that the Practitioner or I may terminate the telemedicine visit at any time. I understand that I have the right to inspect all information obtained and/or recorded in the course of the telemedicine visit and may receive copies of available information for a reasonable fee.  I understand that some of the potential risks of receiving the Services via telemedicine include:  [name] Delay or interruption in medical evaluation due to technological equipment failure or disruption; . Information transmitted may not be sufficient (e.g. poor resolution of images) to allow for appropriate medical decision making by the  Practitioner; and/or  . In rare instances, security protocols could fail, causing a breach of personal health information.  Furthermore, I acknowledge that it is my responsibility to provide information about my medical history, conditions and care that is complete and accurate to the best of my ability. I acknowledge that Practitioner's advice, recommendations, and/or decision may be based on factors not within their control, such as incomplete or inaccurate data provided by me or distortions of diagnostic images or specimens that may result from electronic transmissions. I understand that the practice of medicine is not an exact science and that Practitioner makes no warranties or guarantees regarding treatment outcomes. I acknowledge that a copy of this consent can be made available to me via my patient portal [facility] MyChart), or I can request a printed copy by calling the office of [facility].    I understand that my insurance will be billed for this visit.   I have read or had this consent read to me. . I understand the contents of this consent, which adequately explains the benefits and risks of the Services being provided via telemedicine.  . I have been provided ample opportunity to ask questions regarding this consent and the Services and have had my questions answered to my satisfaction. . I give my informed consent for the services to be provided through the use of telemedicine in my medical care

## 2020-03-21 NOTE — Progress Notes (Signed)
Cardiology Office Note:       Virtual Visit via Telephone Note   This visit type was conducted due to national recommendations for restrictions regarding the COVID-19 Pandemic (e.g. social distancing) in an effort to limit this patient's exposure and mitigate transmission in our community.  Due to his co-morbid illnesses, this patient is at least at moderate risk for complications without adequate follow up.  This format is felt to be most appropriate for this patient at this time.  The patient did not have access to video technology/had technical difficulties with video requiring transitioning to audio format only (telephone).  All issues noted in this document were discussed and addressed.  No physical exam could be performed with this format.  Please refer to the patient's chart for his  consent to telehealth for [facility].   The patient was identified using 2 identifiers.  Date:  03/21/2020   ID:  [name], DOB February 26, 1941, MRN [id number]  Patient Location: Home Provider Location: Home  PCP:  Patient, No Pcp Per  Cardiologist:  Thurmon Fair, MD  Electrophysiologist:  None   Evaluation Performed:  Follow-Up Visit  Chief Complaint:  CHF   History of Present Illness:    [name] is a 79 y.o. male with a hx of coronary artery disease with redo bypass surgery 2004, aortic valve replacement with biological prosthesis, severe ischemic cardiomyopathy with combined systolic and diastolic heart failure, CRT-D, bilateral carotid stenosis with bilateral endarterectomy in the remote past, recurrent persistent atrial fibrillation.  After undergoing successful cardioversion on February 17 Roe Coombs believes that he improved for a while, but over the last 4-5 days his activity tolerance and overall status have deteriorated.  He is much more short of breath and has low energy.  He received some morphine for his dyspnea yesterday.  He is improved after receiving a dose of Zaroxolyn  yesterday but did develop some gout in his left second toe.  He has had a couple of episodes of weakness where his knees gave out under him, although he has not had full-blown syncope.  He denies any palpitations.  He has not had any leg edema.  He does not have orthopnea or PND.  Currently NYHA functional class IIIa.  He has not had any serious bleeding complications.  We stopped his anticoagulant 1 month following his cardioversion, since he has predictably developed severe symptomatic anemia every time he has been on sustained anticoagulation.  He remains under home hospice care. At [facility] request, defibrillator tachycardia detection and therapies have been turned off.  He has DNR status.  His Care[employer] transmitter has not been in communication, but his daughter [name] said they would do a download today.  His echocardiogram performed December 5 shows EF less than 20%, signs of elevated filling pressures with a restrictive mitral inflow, severely dilated left ventricle and severely dilated left atrium, moderate mitral insufficiency, mild PAH. 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    Past Medical History:  Diagnosis Date  . Anginal pain (HCC)   . Anxiety   . Aortic stenosis    a. s/p bioprosthetic AVR in 2010  . Arthritis    gout  . CAD (coronary artery disease)    a. s/p CABG in 1992 b. redo CABG in 2004 c. cath in 2010 showing patent LIMA-LAD, free RIMA-OM, and SVG-OM, and 80% stenosis of small PLA branch  . Chest pain   . CHF (congestive heart failure) (HCC)   . Dysrhythmia   .  GERD (gastroesophageal reflux disease)    with gas and bloating probably cause of chest pain  . H/O atrial flutter    a. s/p ablation and not on anticoagulation secondary to GIB and alcohol abuse.   . H/O: gout   . Hyperlipidemia   . Hypertension   . Hypertensive retinopathy    OU  . ICD (implantable cardiac defibrillator) infection, s/p removal of ICD 07/12/2012   re-implanted  Medtronic BiV ICD, serial number [id number]   . Ischemic cardiomyopathy    a. EF previously 20-25%, improved to 40-45% by echo in 03/2013 b. 10/2017: echo showing of 30-35% with Grade 2 DD, normal function of AVR, moderate MR.   . Myocardial infarction (HCC)   . 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 Shortness of breath     Past Surgical History:  Procedure Laterality Date  . BI-VENTRICULAR IMPLANTABLE CARDIOVERTER DEFIBRILLATOR Right 08/18/2012   Medtronic BiV ICD, serial number [id number] ; Procedure: BI-VENTRICULAR IMPLANTABLE CARDIOVERTER DEFIBRILLATOR  (CRT-D);  Surgeon: Marinus MawGregg W Taylor, MD;  [facility];  Service: Cardiovascular;  Laterality: Right;  . BIV ICD GENERATOR CHANGEOUT N/A 09/08/2018   Procedure: BIV ICD GENERATOR CHANGEOUT;  Surgeon: Thurmon Fairroitoru, Annastacia Duba, MD;  Location: [facility] INVASIVE CV LAB;  Service: Cardiovascular;  Laterality: N/A;  . CARDIAC CATHETERIZATION  Dec 18, 2003   with a presentation of atrial flutter with rapid ventricular response. He had a cath and two stents to his PDA after his SVG to his RCA. His other graft were patent. He had LV with EF of 30%.   . CARDIOVERSION N/A 05/30/2019   Procedure: CARDIOVERSION;  Surgeon: Thurmon Fairroitoru, Zayra Devito, MD;  Location: [facility] ENDOSCOPY;  Service: Cardiovascular;  Laterality: N/A;  . CARDIOVERSION N/A 09/30/2019   Procedure: CARDIOVERSION;  Surgeon: Thurmon Fairroitoru, Moriyah Byington, MD;  Location: [facility] ENDOSCOPY;  Service: Cardiovascular;  Laterality: N/A;  . CARDIOVERSION N/A 02/08/2020   Procedure: CARDIOVERSION;  Surgeon: Thurmon Fairroitoru, Kemonie Cutillo, MD;  [facility];  Service: Cardiovascular;  Laterality: N/A;  . CAROTID ENDARTERECTOMY  11/2002   left in December 2003 and known renal artery stenosis of 60-70%  . CATARACT EXTRACTION Bilateral   . CORONARY ARTERY BYPASS GRAFT  1993   redo in 2000, he has had multiple MI previous to both procedures.  [name]. EYE SURGERY Bilateral    Cat Sx OU  . IMPLANTABLE  CARDIOVERTER DEFIBRILLATOR (ICD) GENERATOR CHANGE N/A 06/25/2012   Procedure: ICD GENERATOR CHANGE;  Surgeon: Marinus MawGregg W Taylor, MD;  [facility];  Service: Cardiovascular;  Laterality: N/A;  . VALVE REPLACEMENT  2010    Current Medications: Current Meds  Medication Sig  . acetaminophen (TYLENOL) 325 MG tablet Take 325-650 mg by mouth every 6 (six) hours as needed for mild pain or headache.  [name]. amiodarone (PACERONE) 200 MG tablet Take 400 mg by mouth daily.  . carvedilol (COREG) 6.25 MG tablet Take 1 tablet (6.25 mg total) by mouth 2 (two) times daily with a meal.  . colchicine 0.6 MG tablet TAKE 1 TABLET (0.6 MG TOTAL) BY MOUTH AS NEEDED (GOUT ATTACKS).  . furosemide (LASIX) 40 MG tablet Take 40 mg by mouth 2 (two) times daily.  [name]. LORazepam (ATIVAN) 0.5 MG tablet Take 0.5 mg by mouth at bedtime as needed for sleep.  . Menthol, Topical Analgesic, (BENGAY EX) Apply 1 application topically daily as needed (pain).  [name]. metolazone (ZAROXOLYN) 2.5 MG tablet Take 2.5 mg three times a week if needed for swelling (Patient  taking differently: Take 2.5 mg by mouth See admin instructions. TAKE 1 TABLET (2.5 MG) BY MOUTH UP TO 3 TIMES A WEEK IF NEEDED FOR SWELLING)  . Morphine Sulfate (MORPHINE CONCENTRATE) 10 mg / 0.5 ml concentrated solution Take 0.25 mLs by mouth every 2 (two) hours as needed for pain.  . nitroGLYCERIN (NITROSTAT) 0.4 MG SL tablet Place 1 tablet (0.4 mg) under the tongue every 5 minutes as needed for chest pain for up to 3 doses/call 9-1-1 and MD if no relief (Patient taking differently: Place 0.4 mg under the tongue every 5 (five) minutes x 3 doses as needed for chest pain (CALL 9-1-1 AND MD, IF NO RELIEF). )  . omeprazole (PRILOSEC) 20 MG capsule Take 20 mg by mouth daily.  . potassium chloride SA (KLOR-CON) 20 MEQ tablet Take 30 mEq by mouth daily. On days when takes Zaroxolyn take a extra 1&1/2 tablet  . [DISCONTINUED] ferrous sulfate 325 (65 FE) MG EC tablet Take 1 tablet (325 mg  total) by mouth 2 (two) times daily.   Current Facility-Administered Medications for the 03/21/20 encounter (Telemedicine) with Thurmon Fair, MD  Medication  . sodium chloride flush (NS) 0.9 % injection 3 mL     Allergies:   Atorvastatin, Iron, and Other   Social History   Socioeconomic History  . Marital status: Married    Spouse name: Not on file  . Number of children: Not on file  . Years of education: Not on file  . Highest education level: Not on file  Occupational History  . Not on file  Tobacco Use  . Smoking status: Never Smoker  . Smokeless tobacco: Current User    Types: Chew  Substance and Sexual Activity  . Alcohol use: Yes    Alcohol/week: 14.0 standard drinks    Types: 14 Cans of beer per week  . Drug use: No  . Sexual activity: Not on file  Other Topics Concern  . Not on file  Social History Narrative  . Not on file   Social Determinants of Health   Financial Resource Strain:   . Difficulty of Paying Living Expenses:   Food Insecurity:   . Worried About Programme researcher, broadcasting/film/video in the Last Year:   . Barista in the Last Year:   Transportation Needs:   . Freight forwarder (Medical):   [name] Lack of Transportation (Non-Medical):   Physical Activity:   . Days of Exercise per Week:   . Minutes of Exercise per Session:   Stress:   . Feeling of Stress :   Social Connections:   . Frequency of Communication with Friends and Family:   . Frequency of Social Gatherings with Friends and Family:   . Attends Religious Services:   . Active Member of Clubs or Organizations:   . Attends Banker Meetings:   [name] Marital Status:      Family History: The patient's family history includes CAD in his brother.  ROS:   Please see the history of present illness.  All other systems are reviewed and are negative.   EKGs/Labs/Other Studies Reviewed:    The following studies were reviewed today:   EKG:  Unable to review  Recent  Labs: 09/26/2019: ALT 8 11/25/2019: B Natriuretic Peptide 1,948.2 11/28/2019: Magnesium 1.9 12/05/2019: TSH 6.990 02/01/2020: BUN 27; Creatinine, Ser 1.50; Hemoglobin 10.1; Platelets 238; Potassium 4.3; Sodium 138  Recent Lipid Panel    Component Value Date/Time   CHOL 87 (L) 05/05/2019  1402   TRIG 44 05/05/2019 1402   HDL 46 05/05/2019 1402   CHOLHDL 1.9 05/05/2019 1402   CHOLHDL 4.2 12/19/2016 0841   VLDL 15 12/19/2016 0841   LDLCALC 32 05/05/2019 1402    Physical Exam:    VS:  BP 110/60   Pulse 60   Ht 6\' 1"  (1.854 m)   Wt 170 lb (77.1 kg)   BMI 22.43 kg/m     Wt Readings from Last 3 Encounters:  03/21/20 170 lb (77.1 kg)  02/08/20 169 lb (76.7 kg)  02/01/20 172 lb (78 kg)    Vital signs reviewed, unable to examine. ASSESSMENT:    No diagnosis found. PLAN:    In order of problems listed above:  1. Goals of care discussion: [place] has DNR status and is under home hospice care.  He does not want any aggressive interventions.  If he is back in atrial fibrillation we do not plan to perform any additional cardioversion.  Defibrillator therapies are turned "off" but CRT-P is still "on". 2. CHF:  NYHA functional class IIIa, stage D HF, not a candidate for aggressive intervention.  Some limits to additional diuresis due to episodes of near syncope.  EF less than 20%. 3. AFib: I wonder if she went back into atrial fibrillation last Friday, when his condition began to deteriorate again.  He is already on a high dose of amiodarone.  If atrial fibrillation has recurred I do not think we will perform another cardioversion.  Will recommend stopping the amiodarone if he is indeed back in atrial fibrillation.  4. CAD s/p redo CABG:  Denies angina pectoris.  Resume aspirin now that he is off anticoagulation.Timmothy Sours and his daughter do not want invasive procedures. (cath in 2010 showing patent LIMA-LAD, free RIMA-OM, and SVG-OM, and 80% stenosis of small PLA branch). 5. CRT-D:  His device  and EKG showed excellent biventricular pacing even when he was in A. fib.  Heart failure decompensation has been related to the loss of atrial "kick".   Turned off VT/VF detection and therapies. Continue resynchronization pacing therapy. 6. HLP:  On statin with LDL at target. 7. HTN:  He has never tolerated RAAS inhibitors due to symptomatic hypotension. 8. Carotid diseases/p bilateral carotid endarterectomies.  No focal neurological events. 9. S/P AVR: echo shows normal bioprosthetic valve function. 10. Ascending aortic aneurysm:slight increase in size at 5.3 cm by echo. 5.0 cm by CT angiogram of the chest in June 2018, 5.1 cm in March 2019. He is not a candidate for 3rd thoracotomy therefore routine monitoring does not appear to be justified. 11. CKD 3: creatinine 1.5 close to baseline.  Recheck on precardioversion labs. 12. Gout:  As before every time he takes metolazone he has a gout exacerbation.  I recommended starting the colchicine a day before any dose of metolazone 13. Fe def anemia:Iron studies were still not fully replenished by labs from early October.  Hgb improving at 10.1. Anemia likely to worsen when we restart anticoagulation. Continue Fe and folate supplements.  We will be rechecking his hemoglobin level prior to his cardioversion. 14. Hypothyroidism:Borderline elevated TSH level.  Might be amiodarone related.  We will recheck TSH and free T4 in a few months.   15. Amiodarone:  Dose increased to 400 mg daily. 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 Asymptomatic.  Scheduled for DCCV on WED 02/08/2020. This procedure has been fully reviewed with the patient and  informed consent has been obtained.    Medication Adjustments/Labs and Tests Ordered: Current medicines are reviewed at length with the patient today.  Concerns regarding medicines are outlined above.  No orders of the  defined types were placed in this encounter.  No orders of the defined types were placed in this encounter.   There are no Patient Instructions on file for this visit.   Signed, Thurmon Fair, MD  03/21/2020 9:15 AM    [facility]

## 2020-03-22 ENCOUNTER — Telehealth: Payer: Self-pay | Admitting: Cardiovascular Disease

## 2020-03-22 ENCOUNTER — Telehealth: Payer: Self-pay

## 2020-03-22 NOTE — Telephone Encounter (Signed)
I tried to help the pt send a manual transmission with his home remote monitor but was unsuccessful. I called [employer] tech support to get additional help. Transmission received. I told the pt the nurse will review the transmission and I will let Dr. Royann Shivers know we got it. I told him someone will give him a call back.

## 2020-03-22 NOTE — Telephone Encounter (Signed)
I received the transmission. He remains in normal rhythm since the cardioversion. Fluid status looks stable and activity level seems to be improving. I would recommend continuing the amiodarone, at least for the time being.

## 2020-03-22 NOTE — Telephone Encounter (Signed)
Left message for patient to call to schedule 1 month follow up.

## 2020-03-22 NOTE — Telephone Encounter (Signed)
Called patient to help sending remote transmission as requested by Dr. Royann Shivers. Patient states the nurse is coming to his house today and he will call the office and get her to help him with it. Direct phone number given along with hours.

## 2020-03-23 NOTE — Telephone Encounter (Signed)
Spoke with patient to relay Dr. Erin Hearing recommendations. Pt verbalizes understanding, requests that I call his daughter, Waynetta Sandy, as she manages his medications. Spoke with [employer]. Advised of transmission results and recommendations to continue amiodarone 400mg  daily. Beth verbalizes understanding and denies questions or concerns at this time.

## 2020-03-28 ENCOUNTER — Ambulatory Visit (INDEPENDENT_AMBULATORY_CARE_PROVIDER_SITE_OTHER): Admitting: *Deleted

## 2020-03-28 DIAGNOSIS — I5043 Acute on chronic combined systolic (congestive) and diastolic (congestive) heart failure: Secondary | ICD-10-CM

## 2020-03-29 LAB — CUP PACEART REMOTE DEVICE CHECK
Battery Remaining Longevity: 60 mo
Battery Voltage: 2.96 V
Brady Statistic AP VP Percent: 99.39 %
Brady Statistic AP VS Percent: 0.43 %
Brady Statistic AS VP Percent: 0.18 %
Brady Statistic AS VS Percent: 0 %
Brady Statistic RA Percent Paced: 99.8 %
Brady Statistic RV Percent Paced: 99.52 %
Date Time Interrogation Session: 20210407211705
HighPow Impedance: 70 Ohm
Implantable Lead Implant Date: 20130828
Implantable Lead Implant Date: 20130828
Implantable Lead Implant Date: 20130828
Implantable Lead Location: 753858
Implantable Lead Location: 753859
Implantable Lead Location: 753860
Implantable Lead Model: 4194
Implantable Lead Model: 5076
Implantable Lead Model: 6935
Implantable Pulse Generator Implant Date: 20190918
Lead Channel Impedance Value: 247 Ohm
Lead Channel Impedance Value: 285 Ohm
Lead Channel Impedance Value: 285 Ohm
Lead Channel Impedance Value: 456 Ohm
Lead Channel Impedance Value: 551 Ohm
Lead Channel Impedance Value: 760 Ohm
Lead Channel Pacing Threshold Amplitude: 0.625 V
Lead Channel Pacing Threshold Amplitude: 0.75 V
Lead Channel Pacing Threshold Amplitude: 0.875 V
Lead Channel Pacing Threshold Pulse Width: 0.4 ms
Lead Channel Pacing Threshold Pulse Width: 0.4 ms
Lead Channel Pacing Threshold Pulse Width: 0.4 ms
Lead Channel Sensing Intrinsic Amplitude: 0.25 mV
Lead Channel Sensing Intrinsic Amplitude: 0.375 mV
Lead Channel Sensing Intrinsic Amplitude: 11.625 mV
Lead Channel Sensing Intrinsic Amplitude: 11.625 mV
Lead Channel Setting Pacing Amplitude: 1.5 V
Lead Channel Setting Pacing Amplitude: 2.25 V
Lead Channel Setting Pacing Amplitude: 2.5 V
Lead Channel Setting Pacing Pulse Width: 0.4 ms
Lead Channel Setting Pacing Pulse Width: 0.4 ms
Lead Channel Setting Sensing Sensitivity: 0.3 mV

## 2020-03-30 ENCOUNTER — Encounter (INDEPENDENT_AMBULATORY_CARE_PROVIDER_SITE_OTHER): Payer: Self-pay | Admitting: Ophthalmology

## 2020-03-30 ENCOUNTER — Ambulatory Visit (INDEPENDENT_AMBULATORY_CARE_PROVIDER_SITE_OTHER): Payer: Medicare Other | Admitting: Ophthalmology

## 2020-03-30 DIAGNOSIS — I10 Essential (primary) hypertension: Secondary | ICD-10-CM

## 2020-03-30 DIAGNOSIS — H4041X0 Glaucoma secondary to eye inflammation, right eye, stage unspecified: Secondary | ICD-10-CM

## 2020-03-30 DIAGNOSIS — H4311 Vitreous hemorrhage, right eye: Secondary | ICD-10-CM | POA: Diagnosis not present

## 2020-03-30 DIAGNOSIS — Z961 Presence of intraocular lens: Secondary | ICD-10-CM

## 2020-03-30 DIAGNOSIS — H3581 Retinal edema: Secondary | ICD-10-CM | POA: Diagnosis not present

## 2020-03-30 DIAGNOSIS — H209 Unspecified iridocyclitis: Secondary | ICD-10-CM

## 2020-03-30 DIAGNOSIS — H2101 Hyphema, right eye: Secondary | ICD-10-CM | POA: Diagnosis not present

## 2020-03-30 DIAGNOSIS — H35033 Hypertensive retinopathy, bilateral: Secondary | ICD-10-CM

## 2020-03-30 NOTE — Progress Notes (Signed)
[facility] - Clinic Note  03/30/2020     CHIEF COMPLAINT Patient presents for Retina Follow Up   HISTORY OF PRESENT ILLNESS: [name] is a 79 y.o. male who presents to the clinic today for:   HPI    Retina Follow Up    Patient presents with  Other.  In right eye.  This started months ago.  Severity is moderate.  Duration of 4 months.  Since onset it is gradually improving.  I, the attending physician,  performed the HPI with the patient and updated documentation appropriately.          Comments    79 y/o male pt here for 4 mo f/u for VH OD.  VA doing well OU.  Denies pain, FOL, floaters.  No gtts.       Last edited by Rennis ChrisZamora, Analeia Ismael, MD on 03/30/2020 11:24 AM. (History)    pt states vision doing well OU. Patient states he is off blood thinner.  Referring physician: No referring provider defined for this encounter.  HISTORICAL INFORMATION: vitre  Selected notes from the MEDICAL RECORD NUMBER    CURRENT MEDICATIONS: No current outpatient medications on file. (Ophthalmic Drugs)   No current facility-administered medications for this visit. (Ophthalmic Drugs)   Current Outpatient Medications (Other)  Medication Sig  . acetaminophen (TYLENOL) 325 MG tablet Take 325-650 mg by mouth every 6 (six) hours as needed for mild pain or headache.  . ACETAMINOPHEN EXTRA STRENGTH 500 MG tablet Take 500-1,000 mg by mouth every 4 (four) hours as needed.  [name]. amiodarone (PACERONE) 200 MG tablet Take 400 mg by mouth daily.  [name]. apixaban (ELIQUIS) 5 MG TABS tablet Take by mouth.  . carvedilol (COREG) 6.25 MG tablet Take 1 tablet (6.25 mg total) by mouth 2 (two) times daily with a meal.  . colchicine 0.6 MG tablet TAKE 1 TABLET (0.6 MG TOTAL) BY MOUTH AS NEEDED (GOUT ATTACKS).  . ferrous sulfate 324 (65 Fe) MG TBEC Take by mouth.  . folic acid (FOLVITE) 1 MG tablet Take by mouth.  . furosemide (LASIX) 40 MG tablet Take 40 mg by mouth 2 (two) times daily.  [name]. LORazepam  (ATIVAN) 0.5 MG tablet Take 0.5 mg by mouth at bedtime as needed for sleep.  . Menthol, Topical Analgesic, (BENGAY EX) Apply 1 application topically daily as needed (pain).  [name]. metolazone (ZAROXOLYN) 2.5 MG tablet Take 2.5 mg three times a week if needed for swelling (Patient taking differently: Take 2.5 mg by mouth See admin instructions. TAKE 1 TABLET (2.5 MG) BY MOUTH UP TO 3 TIMES A WEEK IF NEEDED FOR SWELLING)  . Morphine Sulfate (MORPHINE CONCENTRATE) 10 mg / 0.5 ml concentrated solution Take 0.25 mLs by mouth every 2 (two) hours as needed for pain.  . nitroGLYCERIN (NITROSTAT) 0.4 MG SL tablet Place 1 tablet (0.4 mg) under the tongue every 5 minutes as needed for chest pain for up to 3 doses/call 9-1-1 and MD if no relief (Patient taking differently: Place 0.4 mg under the tongue every 5 (five) minutes x 3 doses as needed for chest pain (CALL 9-1-1 AND MD, IF NO RELIEF). )  . omeprazole (PRILOSEC) 20 MG capsule Take 20 mg by mouth daily.  . potassium chloride SA (KLOR-CON) 20 MEQ tablet Take 30 mEq by mouth daily. On days when takes Zaroxolyn take a extra 1&1/2 tablet   Current Facility-Administered Medications (Other)  Medication Route  . sodium chloride flush (NS) 0.9 % injection 3  mL Intravenous      REVIEW OF SYSTEMS: ROS    Positive for: Genitourinary, Cardiovascular, Eyes   Negative for: Constitutional, Gastrointestinal, Neurological, Skin, Musculoskeletal, HENT, Endocrine, Respiratory, Psychiatric, Allergic/Imm, Heme/Lymph   Last edited by Matthew Folks, COA on 03/30/2020 10:13 AM. (History)       ALLERGIES Allergies  Allergen Reactions  . Atorvastatin Other (See Comments)    Joint pain (patient takes this, however)  . Iron Other (See Comments)    Constipates the patient  . Other Other (See Comments)    Patient is sodium-restricted, per his admission    PAST MEDICAL HISTORY Past Medical History:  Diagnosis Date  . Anginal pain ([place])   . Anxiety   . Aortic  stenosis    a. s/p bioprosthetic AVR in 2010  . Arthritis    gout  . CAD (coronary artery disease)    a. s/p CABG in 1992 b. redo CABG in 2004 c. cath in 2010 showing patent LIMA-LAD, free RIMA-OM, and SVG-OM, and 80% stenosis of small PLA branch  . Chest pain   . CHF (congestive heart failure) ([place])   . Dysrhythmia   . GERD (gastroesophageal reflux disease)    with gas and bloating probably cause of chest pain  . H/O atrial flutter    a. s/p ablation and not on anticoagulation secondary to GIB and alcohol abuse.   . H/O: gout   . Hyperlipidemia   . Hypertension   . Hypertensive retinopathy    OU  . ICD (implantable cardiac defibrillator) infection, s/p removal of ICD 07/12/2012   re-implanted Medtronic BiV ICD, serial number [id number]   . Ischemic cardiomyopathy    a. EF previously 20-25%, improved to 40-45% by echo in 03/2013 b. 10/2017: echo showing of 30-35% with Grade 2 DD, normal function of AVR, moderate MR.   . Myocardial infarction ([place])   . 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 Shortness of breath    Past Surgical History:  Procedure Laterality Date  . BI-VENTRICULAR IMPLANTABLE CARDIOVERTER DEFIBRILLATOR Right 08/18/2012   Medtronic BiV ICD, serial number [id number] ; Procedure: BI-VENTRICULAR IMPLANTABLE CARDIOVERTER DEFIBRILLATOR  (CRT-D);  Surgeon: Evans Lance, MD;  Location: [facility] CATH LAB;  Service: Cardiovascular;  Laterality: Right;  . BIV ICD GENERATOR CHANGEOUT N/A 09/08/2018   Procedure: BIV ICD GENERATOR CHANGEOUT;  Surgeon: Sanda Klein, MD;  Location: [place] CV LAB;  Service: Cardiovascular;  Laterality: N/A;  . CARDIAC CATHETERIZATION  Dec 18, 2003   with a presentation of atrial flutter with rapid ventricular response. He had a cath and two stents to his PDA after his SVG to his RCA. His other graft were patent. He had LV with EF of 30%.   . CARDIOVERSION N/A 05/30/2019   Procedure: CARDIOVERSION;  Surgeon:  Sanda Klein, MD;  Location: [place] ENDOSCOPY;  Service: Cardiovascular;  Laterality: N/A;  . CARDIOVERSION N/A 09/30/2019   Procedure: CARDIOVERSION;  Surgeon: Sanda Klein, MD;  Location: [place] ENDOSCOPY;  Service: Cardiovascular;  Laterality: N/A;  . CARDIOVERSION N/A 02/08/2020   Procedure: CARDIOVERSION;  Surgeon: Sanda Klein, MD;  Location: [facility] ENDOSCOPY;  Service: Cardiovascular;  Laterality: N/A;  . CAROTID ENDARTERECTOMY  11/2002   left in December 2003 and known renal artery stenosis of 60-70%  . CATARACT EXTRACTION Bilateral   . CORONARY ARTERY BYPASS GRAFT  1993   redo in 2000, he has had multiple MI previous to both procedures.  [name] EYE SURGERY Bilateral  Cat Sx OU  . IMPLANTABLE CARDIOVERTER DEFIBRILLATOR (ICD) GENERATOR CHANGE N/A 06/25/2012   Procedure: ICD GENERATOR CHANGE;  Surgeon: Marinus Maw, MD;  Location: [facility] CATH LAB;  Service: Cardiovascular;  Laterality: N/A;  . VALVE REPLACEMENT  2010    FAMILY HISTORY Family History  Problem Relation Age of Onset  . CAD Brother     SOCIAL HISTORY Social History   Tobacco Use  . Smoking status: Never Smoker  . Smokeless tobacco: Current User    Types: Chew  Substance Use Topics  . Alcohol use: Yes    Alcohol/week: 14.0 standard drinks    Types: 14 Cans of beer per week  . Drug use: No         OPHTHALMIC EXAM:  Base Eye Exam    Visual Acuity (Snellen - Linear)      Right Left   Dist [place] 20/25 -2 20/20 -2   Dist ph [place] NI        Tonometry (Tonopen, 10:16 AM)      Right Left   Pressure 11 13       Pupils      Dark Light Shape React APD   Right 3 2 Round Brisk None   Left 3 2 Round Brisk None       Visual Fields (Counting fingers)      Left Right    Full Full       Extraocular Movement      Right Left    Full, Ortho Full, Ortho       Neuro/Psych    Oriented x3: Yes   Mood/Affect: Normal       Dilation    Both eyes: 1.0% Mydriacyl, 2.5% Phenylephrine @ 10:16 AM        Slit Lamp and  Fundus Exam    Slit Lamp Exam      Right Left   Lids/Lashes Dermatochalasis - upper lid, Meibomian gland dysfunction Dermatochalasis - upper lid, Meibomian gland dysfunction   Conjunctiva/Sclera Mild, temporal Pinguecula, white and quiet Mild, temporal Pinguecula, white and quiet   Cornea 1+ Punctate epithelial erosions, Debris in tear film 2+ Punctate epithelial erosions, tear film debris   Anterior Chamber Deep and quiet, no cell or RBC Deep and quiet   Iris Round and very poorly dilated to 62mm, slightly irregular, vertical, linear TID at 0300 with +heme improved Round and poorly dilated to 57mm   Lens Posterior chamber intraocular lens, pigment deposition on anterior optic - improved Posterior chamber intraocular lens   Vitreous Mild Vitreous syneresis; no heme Mild Vitreous syneresis       Fundus Exam      Right Left   Disc pink, sharp Pink and Sharp   C/D Ratio 0.2 0.2   Macula Flat, blunted foveal reflex, mild RPE mottlling, no heme or edema Flat, Good foveal reflex, No heme or edema   Vessels mild attenuation milid Vascular attenuation   Periphery attached; no heme Attached, no heme             IMAGING AND PROCEDURES  Imaging and Procedures for @TODAY @  OCT, Retina - OU - Both Eyes       Right Eye Quality was good. Central Foveal Thickness: 271. Progression has been stable. Findings include no IRF, normal foveal contour, no SRF, vitreomacular adhesion  (Partial PVD).   Left Eye Quality was good. Central Foveal Thickness: 256. Progression has been stable. Findings include normal foveal contour, no IRF, no SRF, vitreomacular adhesion .  Notes *Images captured and stored on drive  Diagnosis / Impression:  OD: NFP; no IRF/SRF; partial PVD--stable OS: NFP, no IRF/SRF; +VMA--stable  Clinical management:  See below  Abbreviations: NFP - Normal foveal profile. CME - cystoid macular edema. PED - pigment epithelial detachment. IRF - intraretinal fluid. SRF - subretinal  fluid. EZ - ellipsoid zone. ERM - epiretinal membrane. ORA - outer retinal atrophy. ORT - outer retinal tubulation. SRHM - subretinal hyper-reflective material                 ASSESSMENT/PLAN:    ICD-10-CM   1. Hyphema, right eye  H21.01   2. Vitreous hemorrhage, right eye (HCC)  H43.11   3. Uveitis-hyphema-glaucoma syndrome of right eye  T85.398A    H20.9    H40.41X0   4. Retinal edema  H35.81 OCT, Retina - OU - Both Eyes  5. Essential hypertension  I10   6. Hypertensive retinopathy of both eyes  H35.033   7. Pseudophakia of both eyes  Z96.1     1-3. Vitreous hemorrhage w/ layered hyphema OD -- stably resolved        UGH syndrome OD  - pt with significant cardiac history and recent cardioversion on Eliquis -- pt has stopped Eliquis as of 10.21.20 -- cleared with cardiologist  - initially presented with 2 wk history of decreased vision OD -- was intermittent initially, but 2 days prior to presentation (~10.14.20), became stably poor without improvement  - initial exam showed poor dilation, layered hyphema and vitreous hemorrhage OD;   - large nasal TID w/ +heme within and mild pseudophakodonesis -- UGH syndrome more apparent and likely etiology    - s/p therapeutic AC tap (10.22.20 and 10.29.20) to remove some heme and lower IOP  - today, layered hyphema stably resolved -- no cell or RBC  - IOP okay at 11 mmHg OD  - VH cleared -- posterior pole without significant pathology  - OCT essentially normal OD  - prior b-scans showed +vit opacities/heme, no obvious RD or mass -- majority of vit opacities were anterior close to IOL  - pt denies trauma or fall; suspect VH related to Eliquis use + UGH syndrome  - VH/hyphema precautions reviewed -- minimize activities, keep head elevated, avoid ASA/NSAIDs/blood thinners as able  - patient off gtts  - f/u 12 months  4. No retinal edema on exam or OCT  5,6. Hypertensive retinopathy OU  - discussed importance of tight BP control  -  monitor  7. Pseudophakia OU  - s/p CE/IOL OU  - monitor   Ophthalmic Meds Ordered this visit:  No orders of the defined types were placed in this encounter.      Return in about 1 year (around 03/30/2021) for DFE, OCT.  There are no Patient Instructions on file for this visit.   Explained the diagnoses, plan, and follow up with the patient and they expressed understanding.  Patient expressed understanding of the importance of proper follow up care.   This document serves as a record of services personally performed by Karie Chimera, MD, PhD. It was created on their behalf by Cristopher Estimable, COT an ophthalmic technician. The creation of this record is the provider's dictation and/or activities during the visit.    Electronically signed by: Cristopher Estimable, COT 03/30/20 @ 3:47 PM  Karie Chimera, M.D., Ph.D. Diseases & Surgery of the Retina and Vitreous [facility] 03/30/2020   I have reviewed the above documentation for  accuracy and completeness, and I agree with the above. Karie Chimera, M.D., Ph.D. 04/01/20 3:47 PM   Abbreviations: M myopia (nearsighted); A astigmatism; H hyperopia (farsighted); P presbyopia; Mrx spectacle prescription;  CTL contact lenses; OD right eye; OS left eye; OU both eyes  XT exotropia; ET esotropia; PEK punctate epithelial keratitis; PEE punctate epithelial erosions; DES dry eye syndrome; MGD meibomian gland dysfunction; ATs artificial tears; PFAT's preservative free artificial tears; NSC nuclear sclerotic cataract; PSC posterior subcapsular cataract; ERM epi-retinal membrane; PVD posterior vitreous detachment; RD retinal detachment; DM diabetes mellitus; DR diabetic retinopathy; NPDR non-proliferative diabetic retinopathy; PDR proliferative diabetic retinopathy; CSME clinically significant macular edema; DME diabetic macular edema; dbh dot blot hemorrhages; CWS cotton wool spot; POAG primary open angle glaucoma; C/D cup-to-disc ratio;  HVF humphrey visual field; GVF goldmann visual field; OCT optical coherence tomography; IOP intraocular pressure; BRVO Branch retinal vein occlusion; CRVO central retinal vein occlusion; CRAO central retinal artery occlusion; BRAO branch retinal artery occlusion; RT retinal tear; SB scleral buckle; PPV pars plana vitrectomy; VH Vitreous hemorrhage; PRP panretinal laser photocoagulation; IVK intravitreal kenalog; VMT vitreomacular traction; MH Macular hole;  NVD neovascularization of the disc; NVE neovascularization elsewhere; AREDS age related eye disease study; ARMD age related macular degeneration; POAG primary open angle glaucoma; EBMD epithelial/anterior basement membrane dystrophy; ACIOL anterior chamber intraocular lens; IOL intraocular lens; PCIOL posterior chamber intraocular lens; Phaco/IOL phacoemulsification with intraocular lens placement; PRK photorefractive keratectomy; LASIK laser assisted in situ keratomileusis; HTN hypertension; DM diabetes mellitus; COPD chronic obstructive pulmonary disease

## 2020-04-10 ENCOUNTER — Telehealth: Payer: Self-pay | Admitting: Cardiovascular Disease

## 2020-04-10 NOTE — Telephone Encounter (Signed)
Left message for patient to call and schedule virtual appt with Dr. Royann Shivers ( Post Cardioversion)---- AF

## 2020-04-11 ENCOUNTER — Encounter: Payer: Self-pay | Admitting: Cardiovascular Disease

## 2020-04-11 NOTE — Telephone Encounter (Signed)
   Pt's wife calling, trying to schedule pt with Dr. Royann Shivers for device check, however next available appt is on 07/02/20. Pt's wife would like to know if they can get in sooner.  Please call

## 2020-04-11 NOTE — Telephone Encounter (Signed)
error  Cancelling request

## 2020-04-12 ENCOUNTER — Telehealth (INDEPENDENT_AMBULATORY_CARE_PROVIDER_SITE_OTHER): Admitting: Cardiovascular Disease

## 2020-04-12 ENCOUNTER — Encounter: Payer: Self-pay | Admitting: Cardiovascular Disease

## 2020-04-12 VITALS — BP 108/60 | Ht 72.0 in | Wt 160.0 lb

## 2020-04-12 DIAGNOSIS — I5040 Unspecified combined systolic (congestive) and diastolic (congestive) heart failure: Secondary | ICD-10-CM

## 2020-04-12 DIAGNOSIS — D500 Iron deficiency anemia secondary to blood loss (chronic): Secondary | ICD-10-CM

## 2020-04-12 DIAGNOSIS — I480 Paroxysmal atrial fibrillation: Secondary | ICD-10-CM

## 2020-04-12 DIAGNOSIS — I130 Hypertensive heart and chronic kidney disease with heart failure and stage 1 through stage 4 chronic kidney disease, or unspecified chronic kidney disease: Secondary | ICD-10-CM

## 2020-04-12 DIAGNOSIS — I712 Thoracic aortic aneurysm, without rupture: Secondary | ICD-10-CM

## 2020-04-12 DIAGNOSIS — E039 Hypothyroidism, unspecified: Secondary | ICD-10-CM

## 2020-04-12 DIAGNOSIS — Z95810 Presence of automatic (implantable) cardiac defibrillator: Secondary | ICD-10-CM

## 2020-04-12 DIAGNOSIS — E7800 Pure hypercholesterolemia, unspecified: Secondary | ICD-10-CM

## 2020-04-12 DIAGNOSIS — I4819 Other persistent atrial fibrillation: Secondary | ICD-10-CM

## 2020-04-12 DIAGNOSIS — N183 Chronic kidney disease, stage 3 unspecified: Secondary | ICD-10-CM

## 2020-04-12 DIAGNOSIS — I10 Essential (primary) hypertension: Secondary | ICD-10-CM

## 2020-04-12 DIAGNOSIS — I25810 Atherosclerosis of coronary artery bypass graft(s) without angina pectoris: Secondary | ICD-10-CM

## 2020-04-12 DIAGNOSIS — Z515 Encounter for palliative care: Secondary | ICD-10-CM

## 2020-04-12 DIAGNOSIS — E785 Hyperlipidemia, unspecified: Secondary | ICD-10-CM

## 2020-04-12 DIAGNOSIS — Z9889 Other specified postprocedural states: Secondary | ICD-10-CM

## 2020-04-12 DIAGNOSIS — Z5181 Encounter for therapeutic drug level monitoring: Secondary | ICD-10-CM

## 2020-04-12 DIAGNOSIS — Z952 Presence of prosthetic heart valve: Secondary | ICD-10-CM

## 2020-04-12 DIAGNOSIS — I5042 Chronic combined systolic (congestive) and diastolic (congestive) heart failure: Secondary | ICD-10-CM

## 2020-04-12 DIAGNOSIS — Z8739 Personal history of other diseases of the musculoskeletal system and connective tissue: Secondary | ICD-10-CM

## 2020-04-12 DIAGNOSIS — N1831 Chronic kidney disease, stage 3a: Secondary | ICD-10-CM

## 2020-04-12 DIAGNOSIS — D6489 Other specified anemias: Secondary | ICD-10-CM

## 2020-04-12 DIAGNOSIS — Z953 Presence of xenogenic heart valve: Secondary | ICD-10-CM

## 2020-04-12 DIAGNOSIS — I2510 Atherosclerotic heart disease of native coronary artery without angina pectoris: Secondary | ICD-10-CM

## 2020-04-12 DIAGNOSIS — Z951 Presence of aortocoronary bypass graft: Secondary | ICD-10-CM

## 2020-04-12 DIAGNOSIS — M109 Gout, unspecified: Secondary | ICD-10-CM

## 2020-04-12 DIAGNOSIS — Z79899 Other long term (current) drug therapy: Secondary | ICD-10-CM

## 2020-04-12 DIAGNOSIS — I7121 Aneurysm of the ascending aorta, without rupture: Secondary | ICD-10-CM

## 2020-04-12 NOTE — Progress Notes (Signed)
Cardiology Office Note:       Virtual Visit via Telephone Note   This visit type was conducted due to national recommendations for restrictions regarding the COVID-19 Pandemic (e.g. social distancing) in an effort to limit this patient's exposure and mitigate transmission in our community.  Due to his co-morbid illnesses, this patient is at least at moderate risk for complications without adequate follow up.  This format is felt to be most appropriate for this patient at this time.  The patient did not have access to video technology/had technical difficulties with video requiring transitioning to audio format only (telephone).  All issues noted in this document were discussed and addressed.  No physical exam could be performed with this format.  Please refer to the patient's chart for his  consent to telehealth for [name].   The patient was identified using 2 identifiers.  Date:  04/12/2020   ID:  [name], DOB Apr 15, 1941, MRN [id number]  Patient Location: Home Provider Location: Home  PCP:  Patient, No Pcp Per  Cardiologist:  Thurmon FairMihai Jelicia Nantz, MD  Electrophysiologist:  None   Evaluation Performed:  Follow-Up Visit  Chief Complaint:  CHF   History of Present Illness:    [name] is a 79 y.o. male with a hx of coronary artery disease with redo bypass surgery 2004, aortic valve replacement with biological prosthesis, severe ischemic cardiomyopathy with combined systolic and diastolic heart failure, CRT-D, bilateral carotid stenosis with bilateral endarterectomy in the remote past, recurrent persistent atrial fibrillation.    He underwent cardioversion on February 08, 2020 and pacemaker download on March 27, 2020 showed maintenance of normal rhythm.  His ICD tachycardia therapies are turned off and he is currently on home hospice due to advanced heart failure.  He is not receiving anticoagulation due to recurrent severe anemia requiring transfusions at the time he  has been on anticoagulants long-term.  He has not been doing too well.  He feels okay as long as he in his bed wearing his oxygen but when he gets up he gets a little dizzy, his knee "gives out" and he feels short of breath.  He is getting a portable oxygen tank today.  He has been working in the garden but what used to take him an hour now takes him a whole day.  His appetite has been poor and he has lost 10 pounds in the last month.  At one point he had some mild edema that resolved after a single dose of metolazone added to his loop diuretic.  He has not had syncope and is not aware of palpitations.  He has not had any chest pain, orthopnea or PND.    He remains under home hospice care. [facility] defibrillator tachycardia detection and therapies have been turned off.  He has DNR status.  His echocardiogram performed December 5 shows EF less than 20%, signs of elevated filling pressures with a restrictive mitral inflow, severely dilated left ventricle and severely dilated left atrium, moderate mitral insufficiency, mild PAH. 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    Past Medical History:  Diagnosis Date  . Anginal pain (HCC)   . Anxiety   . Aortic stenosis    a. s/p bioprosthetic AVR in 2010  . Arthritis    gout  . CAD (coronary artery disease)    a. s/p CABG in 1992 b. redo CABG in 2004 c. cath in 2010 showing patent LIMA-LAD, free RIMA-OM, and SVG-OM, and  80% stenosis of small PLA branch  . Chest pain   . CHF (congestive heart failure) (HCC)   . Dysrhythmia   . GERD (gastroesophageal reflux disease)    with gas and bloating probably cause of chest pain  . H/O atrial flutter    a. s/p ablation and not on anticoagulation secondary to GIB and alcohol abuse.   . H/O: gout   . Hyperlipidemia   . Hypertension   . Hypertensive retinopathy    OU  . ICD (implantable cardiac defibrillator) infection, s/p removal of ICD 07/12/2012   re-implanted Medtronic BiV  ICD, serial number [id number]   . Ischemic cardiomyopathy    a. EF previously 20-25%, improved to 40-45% by echo in 03/2013 b. 10/2017: echo showing of 30-35% with Grade 2 DD, normal function of AVR, moderate MR.   . Myocardial infarction (HCC)   . 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 Shortness of breath     Past Surgical History:  Procedure Laterality Date  . BI-VENTRICULAR IMPLANTABLE CARDIOVERTER DEFIBRILLATOR Right 08/18/2012   Medtronic BiV ICD, serial number [id number] ; Procedure: BI-VENTRICULAR IMPLANTABLE CARDIOVERTER DEFIBRILLATOR  (CRT-D);  Surgeon: Marinus Maw, MD;  Location: [facility] CATH LAB;  Service: Cardiovascular;  Laterality: Right;  . BIV ICD GENERATOR CHANGEOUT N/A 09/08/2018   Procedure: BIV ICD GENERATOR CHANGEOUT;  Surgeon: Thurmon Fair, MD;  Location: [facility] INVASIVE CV LAB;  Service: Cardiovascular;  Laterality: N/A;  . CARDIAC CATHETERIZATION  Dec 18, 2003   with a presentation of atrial flutter with rapid ventricular response. He had a cath and two stents to his PDA after his SVG to his RCA. His other graft were patent. He had LV with EF of 30%.   . CARDIOVERSION N/A 05/30/2019   Procedure: CARDIOVERSION;  Surgeon: Thurmon Fair, MD;  Location: [facility] ENDOSCOPY;  Service: Cardiovascular;  Laterality: N/A;  . CARDIOVERSION N/A 09/30/2019   Procedure: CARDIOVERSION;  Surgeon: Thurmon Fair, MD;  Location: [facility] ENDOSCOPY;  Service: Cardiovascular;  Laterality: N/A;  . CARDIOVERSION N/A 02/08/2020   Procedure: CARDIOVERSION;  Surgeon: Thurmon Fair, MD;  Location: [facility] ENDOSCOPY;  Service: Cardiovascular;  Laterality: N/A;  . CAROTID ENDARTERECTOMY  11/2002   left in December 2003 and known renal artery stenosis of 60-70%  . CATARACT EXTRACTION Bilateral   . CORONARY ARTERY BYPASS GRAFT  1993   redo in 2000, he has had multiple MI previous to both procedures.  [name] EYE SURGERY Bilateral    Cat Sx OU  . IMPLANTABLE CARDIOVERTER  DEFIBRILLATOR (ICD) GENERATOR CHANGE N/A 06/25/2012   Procedure: ICD GENERATOR CHANGE;  Surgeon: Marinus Maw, MD;  Location: [facility] CATH LAB;  Service: Cardiovascular;  Laterality: N/A;  . VALVE REPLACEMENT  2010    Current Medications: Current Meds  Medication Sig  . acetaminophen (TYLENOL) 325 MG tablet Take 325-650 mg by mouth every 6 (six) hours as needed for mild pain or headache.  [name] amiodarone (PACERONE) 200 MG tablet Take 400 mg by mouth daily.  . carvedilol (COREG) 6.25 MG tablet Take 1 tablet (6.25 mg total) by mouth 2 (two) times daily with a meal.  . colchicine 0.6 MG tablet TAKE 1 TABLET (0.6 MG TOTAL) BY MOUTH AS NEEDED (GOUT ATTACKS).  . folic acid (FOLVITE) 1 MG tablet Take by mouth.  . furosemide (LASIX) 40 MG tablet Take 40 mg by mouth 2 (two) times daily.  [name] LORazepam (ATIVAN) 0.5 MG tablet Take 1 mg by mouth at bedtime as needed  for sleep.   . metolazone (ZAROXOLYN) 2.5 MG tablet Take 2.5 mg three times a week if needed for swelling (Patient taking differently: Take 2.5 mg by mouth See admin instructions. TAKE 1 TABLET (2.5 MG) BY MOUTH UP TO 3 TIMES A WEEK IF NEEDED FOR SWELLING)  . Morphine Sulfate (MORPHINE CONCENTRATE) 10 mg / 0.5 ml concentrated solution Take 0.25 mLs by mouth every 2 (two) hours as needed for pain.  . nitroGLYCERIN (NITROSTAT) 0.4 MG SL tablet Place 1 tablet (0.4 mg) under the tongue every 5 minutes as needed for chest pain for up to 3 doses/call 9-1-1 and MD if no relief (Patient taking differently: Place 0.4 mg under the tongue every 5 (five) minutes x 3 doses as needed for chest pain (CALL 9-1-1 AND MD, IF NO RELIEF). )  . omeprazole (PRILOSEC) 20 MG capsule Take 20 mg by mouth daily.  . potassium chloride SA (KLOR-CON) 20 MEQ tablet Take 30 mEq by mouth daily. On days when takes Zaroxolyn take a extra 1&1/2 tablet   Current Facility-Administered Medications for the 04/12/20 encounter (Telemedicine) with Sanda Klein, MD  Medication  . sodium  chloride flush (NS) 0.9 % injection 3 mL     Allergies:   Atorvastatin, Iron, and Other   Social History   Socioeconomic History  . Marital status: Married    Spouse name: Not on file  . Number of children: Not on file  . Years of education: Not on file  . Highest education level: Not on file  Occupational History  . Not on file  Tobacco Use  . Smoking status: Never Smoker  . Smokeless tobacco: Current User    Types: Chew  Substance and Sexual Activity  . Alcohol use: Yes    Alcohol/week: 14.0 standard drinks    Types: 14 Cans of beer per week  . Drug use: No  . Sexual activity: Not on file  Other Topics Concern  . Not on file  Social History Narrative  . Not on file   Social Determinants of Health   Financial Resource Strain:   . Difficulty of Paying Living Expenses:   Food Insecurity:   . Worried About Charity fundraiser in the Last Year:   . Arboriculturist in the Last Year:   Transportation Needs:   . Film/video editor (Medical):   [name] Lack of Transportation (Non-Medical):   Physical Activity:   . Days of Exercise per Week:   . Minutes of Exercise per Session:   Stress:   . Feeling of Stress :   Social Connections:   . Frequency of Communication with Friends and Family:   . Frequency of Social Gatherings with Friends and Family:   . Attends Religious Services:   . Active Member of Clubs or Organizations:   . Attends Archivist Meetings:   [name] Marital Status:      Family History: The patient's family history includes CAD in his brother.  ROS:   Please see the history of present illness.  All other systems are reviewed and are negative.   EKGs/Labs/Other Studies Reviewed:    The following studies were reviewed today:  EKG: Intracardiac electrogram on recent download showed atrial sensed, biventricular paced rhythm  Recent Labs: 09/26/2019: ALT 8 11/25/2019: B Natriuretic Peptide 1,948.2 11/28/2019: Magnesium 1.9 12/05/2019: TSH  6.990 02/01/2020: BUN 27; Creatinine, Ser 1.50; Hemoglobin 10.1; Platelets 238; Potassium 4.3; Sodium 138  Recent Lipid Panel    Component Value Date/Time  CHOL 87 (L) 05/05/2019 1402   TRIG 44 05/05/2019 1402   HDL 46 05/05/2019 1402   CHOLHDL 1.9 05/05/2019 1402   CHOLHDL 4.2 12/19/2016 0841   VLDL 15 12/19/2016 0841   LDLCALC 32 05/05/2019 1402    Physical Exam:    VS:  BP 108/60   Ht 6' (1.829 m)   Wt 160 lb (72.6 kg)   BMI 21.70 kg/m     Wt Readings from Last 3 Encounters:  04/12/20 160 lb (72.6 kg)  03/21/20 170 lb (77.1 kg)  02/08/20 169 lb (76.7 kg)    Vital signs reviewed, unable to examine. ASSESSMENT:    No diagnosis found. PLAN:    In order of problems listed above:  1. Palliative care: Clerence has DNR status and is under home hospice care.  He does not want any aggressive interventions.  If he is back in atrial fibrillation we do not plan to perform any additional cardioversion.  Defibrillator therapies are turned "off" but CRT-P is still "on". 2. CHF:  NYHA functional class IIIa, stage D HF, not a candidate for aggressive intervention.  LVEF less than 20%.  Required metolazone a few weeks ago. 3. AFib:No plan for additional cardioversion.  If atrial fibrillation returns we will discontinue amiodarone as well.  Not on chronic anticoagulation due to recurrent severe GI bleeding without an identifiable source. 4. CAD s/p redo CABG:  Does not have angina pectoris.  Roe Coombs and his daughter do not want invasive procedures. (cath in 2010 showing patent LIMA-LAD, free RIMA-OM, and SVG-OM, and 80% stenosis of small PLA branch). 5. CRT-D:  Tachycardia detection defibrillation therapies have been turned off.  Continues to function as a biventricular pacemaker. 6. HLP:  On statin with LDL at target.  With his recent weight loss encouraged him to increase his intake of any type of food that he finds palatable, as long as he restricts his sodium. 7. HTN:  He has never  tolerated RAAS inhibitors due to symptomatic hypotension. 8. Carotid diseases/p bilateral carotid endarterectomies.  No active neurological complaints. 9. S/P BZJ:IRCVEL bioprosthetic aortic valve function on most recent echo. 10. Ascending aortic aneurysm:slight increase in size at 5.3 cm by echo. 5.0 cm by CT angiogram of the chest in June 2018, 5.1 cm in March 2019. He is not a candidate for 3rd thoracotomy therefore routine monitoring does not appear to be justified. 11. CKD 3: Baseline creatinine seems to be 1.5 or so (GFR 45). 12. Gout:  Likely to be triggered by metolazone so he should take colchicine starting the day before he takes metolazone. 13. Fe def anemia:Iron studies were still not fully replenished by labs from early October.  Hgb improving at 10.1. Anemia likely to worsen when we restart anticoagulation. Continue Fe and folate supplements.  We will be rechecking his hemoglobin level prior to his cardioversion. 14. Hypothyroidism:Borderline elevated TSH level.  Recheck TSH and free T4 in June. 15. Amiodarone:  Dose increased to 400 mg daily for recurrent atrial fibrillation. 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 Asymptomatic.   Medication Adjustments/Labs and Tests Ordered: Current medicines are reviewed at length with the patient today.  Concerns regarding medicines are outlined above.  No orders of the defined types were placed in this encounter.  No orders of the defined types were placed in this encounter.   There are no Patient Instructions on file for this visit.   Signed, Thurmon Fair, MD  04/12/2020  9:15 AM    [name]

## 2020-04-12 NOTE — Patient Instructions (Addendum)
Medication Instructions:  No changes *If you need a refill on your cardiac medications before your next appointment, please call your pharmacy*   Lab Work: None ordered If you have labs (blood work) drawn today and your tests are completely normal, you will receive your results only by: [name] MyChart Message (if you have MyChart) OR . A paper copy in the mail If you have any lab test that is abnormal or we need to change your treatment, we will call you to review the results.   Testing/Procedures: None ordered   Follow-Up: At [facility], you and your health needs are our priority.  As part of our continuing mission to provide you with exceptional heart care, we have created designated Provider Care Teams.  These Care Teams include your primary Cardiologist (physician) and Advanced Practice Providers (APPs -  Physician Assistants and Nurse Practitioners) who all work together to provide you with the care you need, when you need it.  We recommend signing up for the patient portal called "MyChart".  Sign up information is provided on this After Visit Summary.  MyChart is used to connect with patients for Virtual Visits (Telemedicine).  Patients are able to view lab/test results, encounter notes, upcoming appointments, etc.  Non-urgent messages can be sent to your provider as well.   To learn more about what you can do with MyChart, go to [email or web address].    Your next appointment:   Follow up in office with Dr. Royann Shivers, first available Please do a download in 4 weeks-thank you!

## 2020-04-13 NOTE — Op Note (Signed)
Procedure: Electrical Cardioversion Indications:  Atrial Fibrillation  Procedure Details:  Consent: Risks of procedure as well as the alternatives and risks of each were explained to the (patient/caregiver).  Consent for procedure obtained.  Time Out: Verified patient identification, verified procedure, site/side was marked, verified correct patient position, special equipment/implants available, medications/allergies/relevent history reviewed, required imaging and test results available.  Performed  Patient placed on cardiac monitor, pulse oximetry, supplemental oxygen as necessary.  Sedation given: 40 mg IV propofol, Dr. Sampson Goon Pacer pads placed anterior and posterior chest.  Cardioverted 1 time(s).  Cardioversion with synchronized biphasic 150J shock.  Evaluation: Findings: Post procedure EKG shows: atrial paced, biventricular paced rhythm Complications: None Patient did tolerate procedure well. Comprehensive CRT-D device check showed normal post shock device function.  Time Spent Directly with the Patient:  30 minutes   [name] 04/13/2020, 4:10 PM

## 2020-04-16 ENCOUNTER — Telehealth: Payer: Self-pay | Admitting: Cardiovascular Disease

## 2020-04-16 NOTE — Telephone Encounter (Signed)
The funeral has been advised that Dr. Royann Shivers will meet them at the hospital.

## 2020-04-16 NOTE — Telephone Encounter (Signed)
[name] from [facility] is calling because they would like to have Dr. Royann Shivers sign this patients death certificate so that he can get cremated today. The family is wanting to have the funeral service tomorrow. She is aware that Dr. Royann Shivers is at the hospital but she says that they can take it to him. She would also need a mailing address as well.

## 2020-04-16 NOTE — Telephone Encounter (Signed)
Call placed to [facility] at the funeral home. She stated that the family wants to have the patient cremated today therefore they need a signature today. She has been advised that Dr. Royann Shivers is at the hospital today. She stated that they will gladly meet Dr. Royann Shivers at the hospital for the signature.

## 2020-04-21 DEATH — deceased

## 2021-03-29 ENCOUNTER — Encounter (INDEPENDENT_AMBULATORY_CARE_PROVIDER_SITE_OTHER): Payer: Medicare Other | Admitting: Ophthalmology
